# Patient Record
Sex: Female | Born: 1975
Health system: Southern US, Community
[De-identification: ages and names within clinical notes are randomized; demographics above are authoritative.]

## PROBLEM LIST (undated history)

## (undated) DIAGNOSIS — D649 Anemia, unspecified: Secondary | ICD-10-CM

## (undated) DIAGNOSIS — F419 Anxiety disorder, unspecified: Secondary | ICD-10-CM

## (undated) DIAGNOSIS — G4730 Sleep apnea, unspecified: Secondary | ICD-10-CM

## (undated) DIAGNOSIS — T7840XA Allergy, unspecified, initial encounter: Secondary | ICD-10-CM

## (undated) DIAGNOSIS — I10 Essential (primary) hypertension: Secondary | ICD-10-CM

## (undated) DIAGNOSIS — K219 Gastro-esophageal reflux disease without esophagitis: Secondary | ICD-10-CM

## (undated) DIAGNOSIS — F32A Depression, unspecified: Secondary | ICD-10-CM

## (undated) HISTORY — DX: Anxiety disorder, unspecified: F41.9

## (undated) HISTORY — PX: SMALL INTESTINE SURGERY: SHX150

## (undated) HISTORY — DX: Anemia, unspecified: D64.9

## (undated) HISTORY — PX: GASTRIC BYPASS: SHX52

## (undated) HISTORY — PX: KNEE SURGERY: SHX244

## (undated) HISTORY — PX: CHOLECYSTECTOMY: SHX55

## (undated) HISTORY — PX: GASTRECTOMY: SHX58

## (undated) HISTORY — DX: Sleep apnea, unspecified: G47.30

## (undated) HISTORY — PX: WRIST FRACTURE SURGERY: SHX121

## (undated) HISTORY — DX: Allergy, unspecified, initial encounter: T78.40XA

## (undated) HISTORY — DX: Depression, unspecified: F32.A

## (undated) HISTORY — PX: LAPAROSCOPIC GASTRIC RESTRICTIVE DUODENAL PROCEDURE (DUODENAL SWITCH): SHX6667

---

## 1994-04-05 DIAGNOSIS — F32A Depression, unspecified: Secondary | ICD-10-CM | POA: Insufficient documentation

## 1994-04-05 DIAGNOSIS — F4312 Post-traumatic stress disorder, chronic: Secondary | ICD-10-CM | POA: Insufficient documentation

## 1999-03-18 ENCOUNTER — Other Ambulatory Visit: Admission: RE | Admit: 1999-03-18 | Discharge: 1999-03-18 | Payer: Self-pay | Admitting: Family Medicine

## 1999-04-06 DIAGNOSIS — M545 Low back pain, unspecified: Secondary | ICD-10-CM | POA: Insufficient documentation

## 2000-05-29 ENCOUNTER — Other Ambulatory Visit: Admission: RE | Admit: 2000-05-29 | Discharge: 2000-05-29 | Payer: Self-pay | Admitting: Obstetrics and Gynecology

## 2000-07-31 ENCOUNTER — Ambulatory Visit (HOSPITAL_COMMUNITY): Admission: RE | Admit: 2000-07-31 | Discharge: 2000-07-31 | Payer: Self-pay | Admitting: Obstetrics and Gynecology

## 2000-07-31 ENCOUNTER — Encounter (INDEPENDENT_AMBULATORY_CARE_PROVIDER_SITE_OTHER): Payer: Self-pay | Admitting: Specialist

## 2001-05-14 ENCOUNTER — Encounter: Admission: RE | Admit: 2001-05-14 | Discharge: 2001-07-21 | Payer: Self-pay | Admitting: Orthopedic Surgery

## 2001-07-21 ENCOUNTER — Other Ambulatory Visit: Admission: RE | Admit: 2001-07-21 | Discharge: 2001-07-21 | Payer: Self-pay | Admitting: *Deleted

## 2005-08-03 ENCOUNTER — Emergency Department (HOSPITAL_COMMUNITY): Admission: EM | Admit: 2005-08-03 | Discharge: 2005-08-03 | Payer: Self-pay | Admitting: Emergency Medicine

## 2006-09-17 ENCOUNTER — Ambulatory Visit: Payer: Self-pay | Admitting: Internal Medicine

## 2006-09-22 ENCOUNTER — Ambulatory Visit: Payer: Self-pay

## 2006-10-01 ENCOUNTER — Ambulatory Visit: Payer: Self-pay | Admitting: Internal Medicine

## 2006-10-08 ENCOUNTER — Ambulatory Visit: Payer: Self-pay | Admitting: Internal Medicine

## 2007-04-11 ENCOUNTER — Emergency Department (HOSPITAL_COMMUNITY): Admission: EM | Admit: 2007-04-11 | Discharge: 2007-04-11 | Payer: Self-pay | Admitting: Emergency Medicine

## 2007-04-11 IMAGING — CR DG ANKLE COMPLETE 3+V*R*
3 series · 3 of 3 positions shown · non-contrast
Comparison: None.

Exam: Right ankle complete. Three views.

HISTORY: Fall.

[t ankle joint ap right]
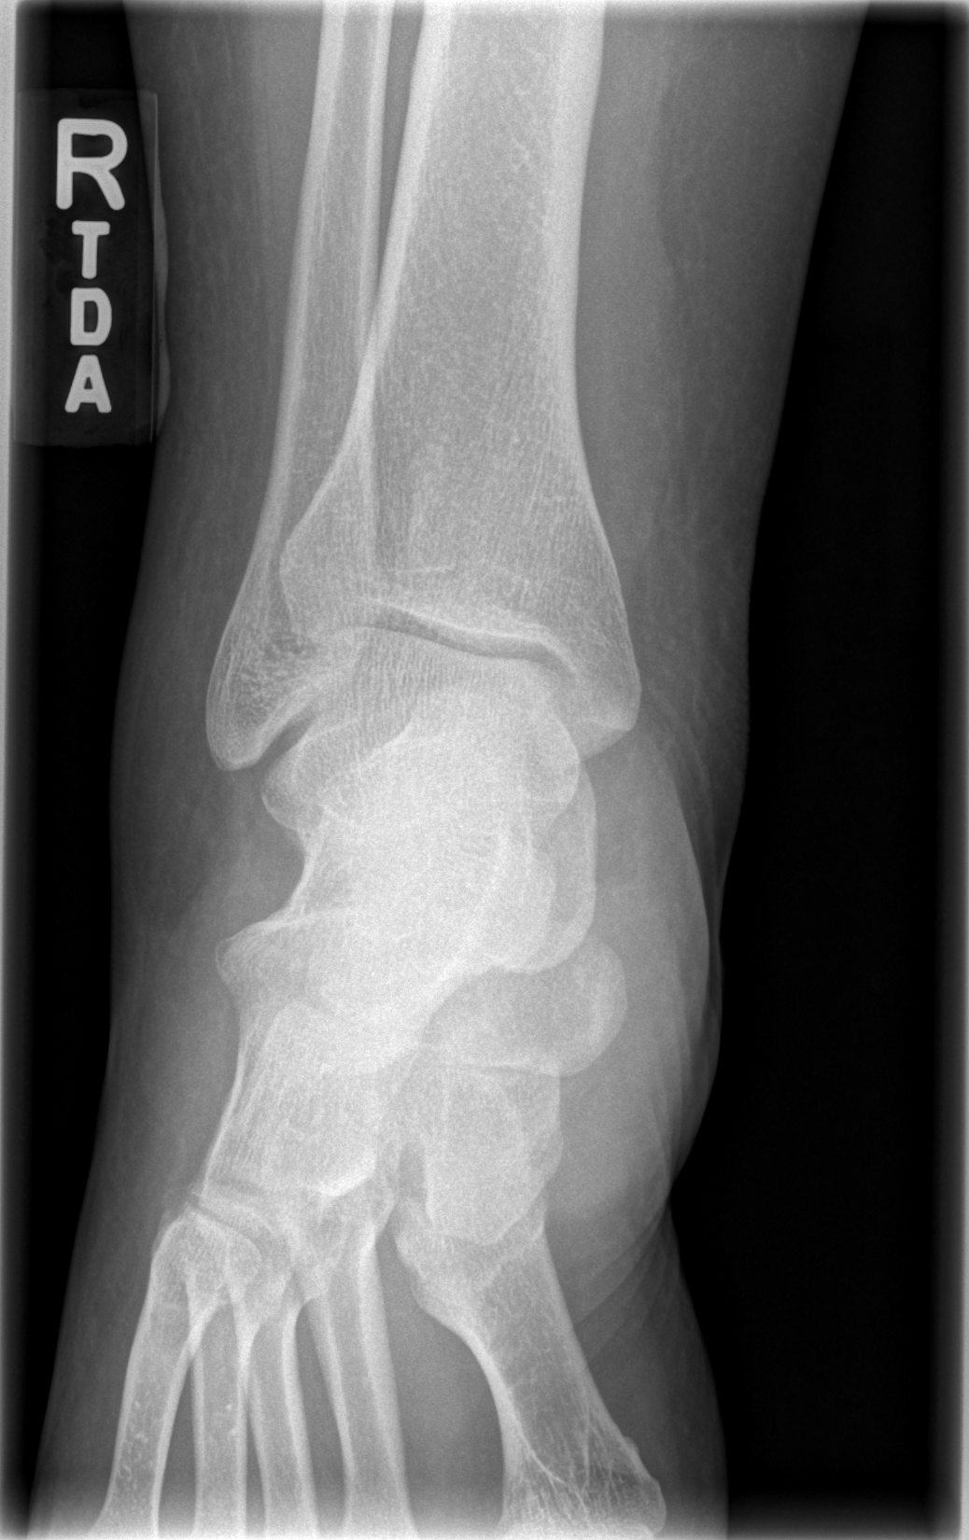

[t ankle joint oblique right]
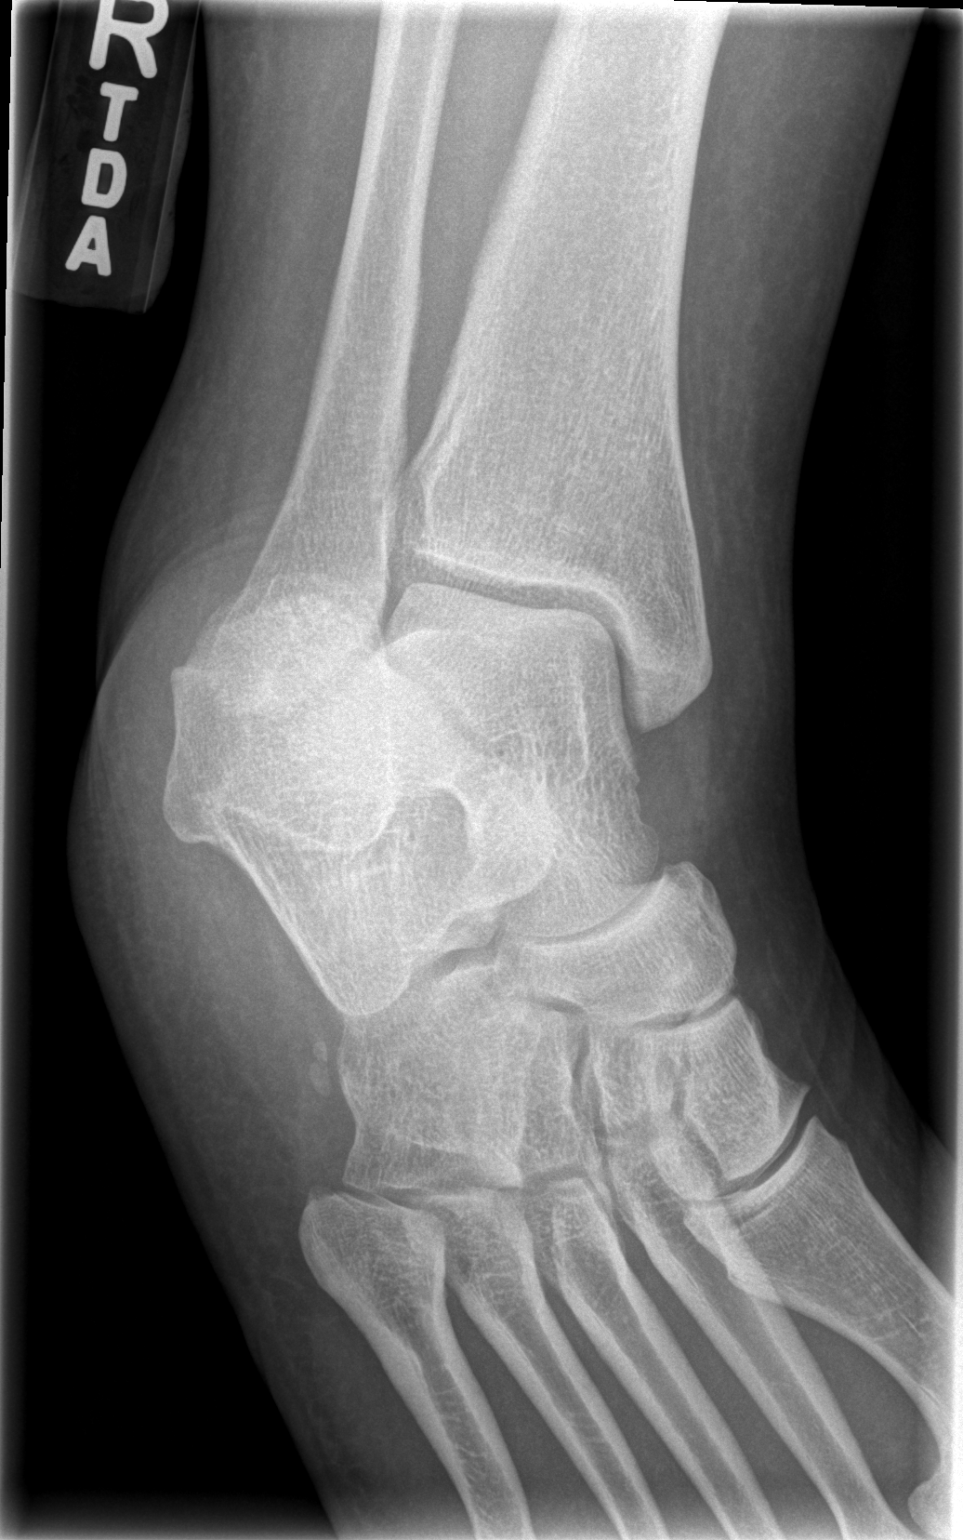

[t ankle joint lat right]
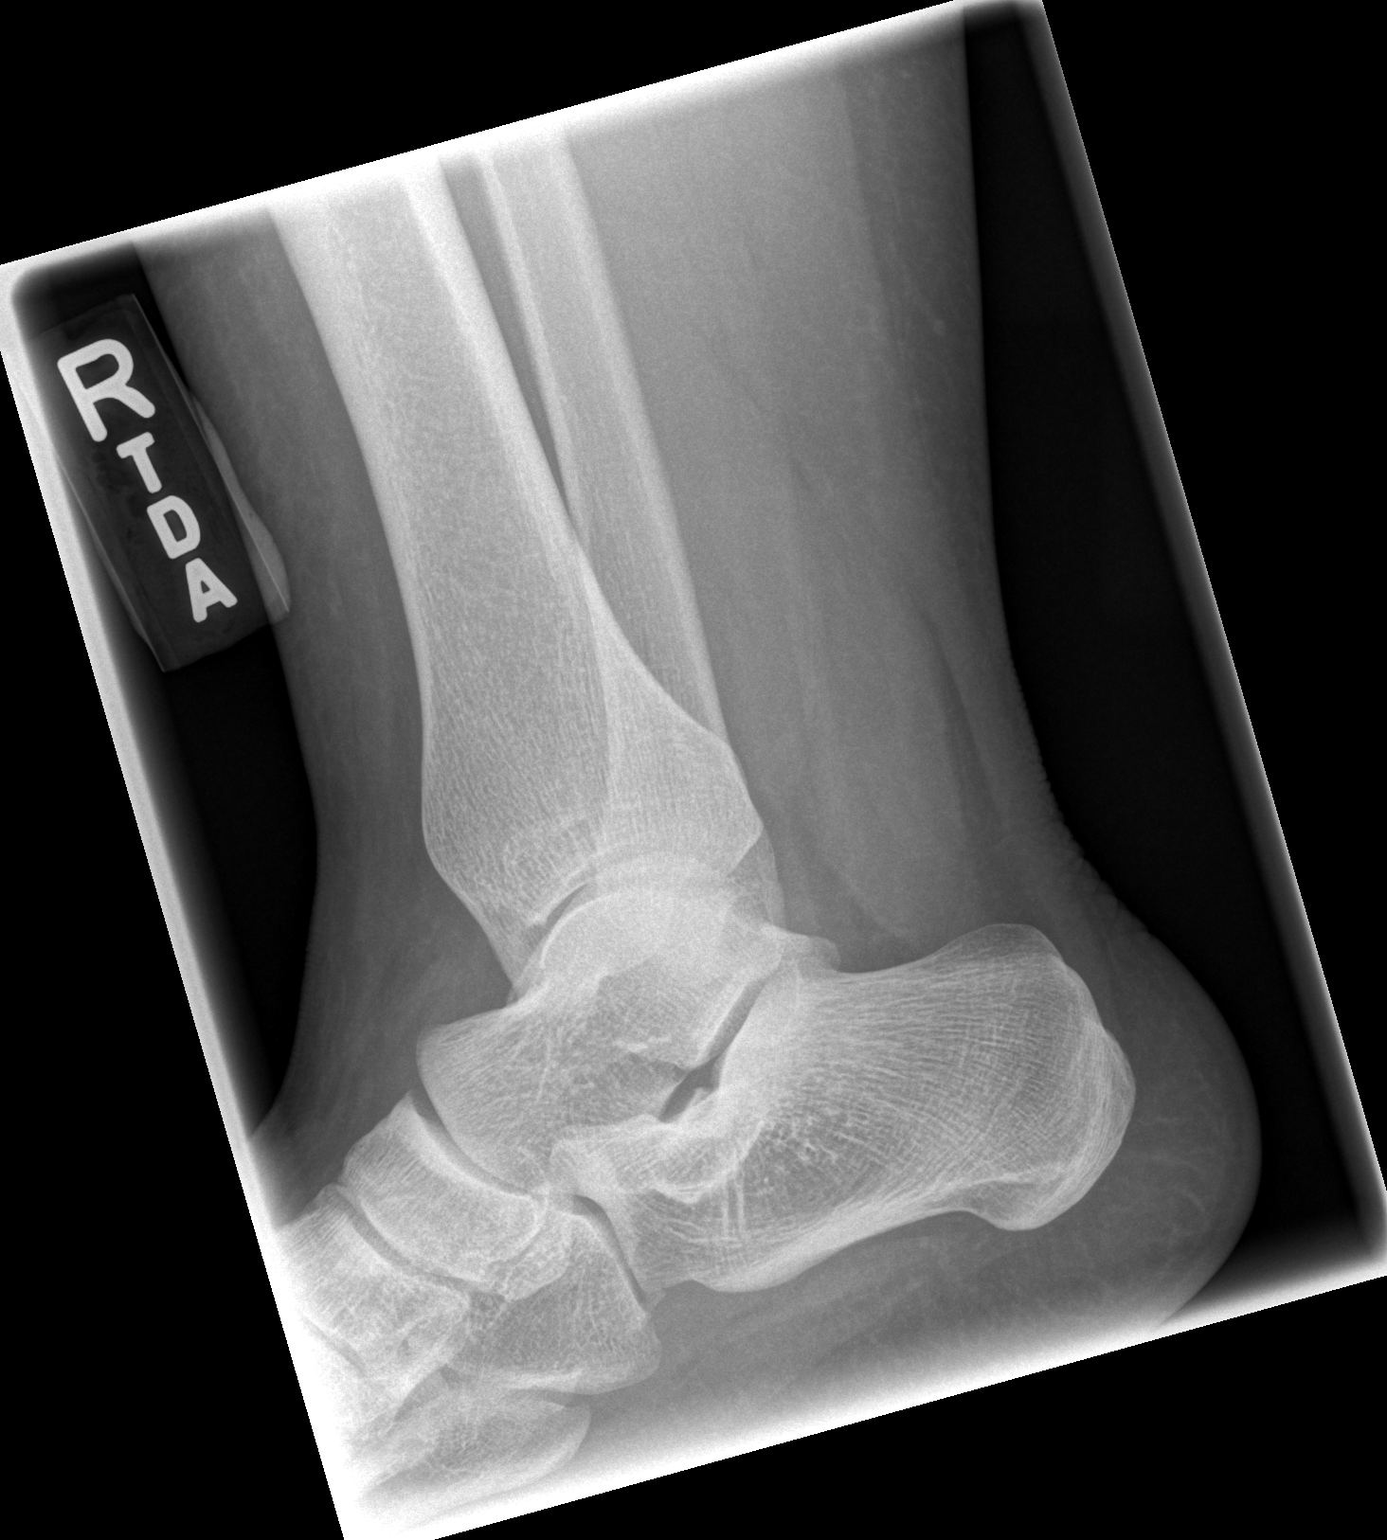

[3 of 3 positions shown; findings below may reference images not displayed]

FINDINGS: There is diffuse soft tissue swelling about the ankle.

No acute osseous abnormality noted. 

Specifically no fracture or dislocation.
IMPRESSION: No acute osseous abnormality

## 2008-06-22 ENCOUNTER — Ambulatory Visit: Payer: Self-pay | Admitting: Internal Medicine

## 2008-12-22 DIAGNOSIS — G8929 Other chronic pain: Secondary | ICD-10-CM | POA: Insufficient documentation

## 2008-12-22 DIAGNOSIS — K760 Fatty (change of) liver, not elsewhere classified: Secondary | ICD-10-CM | POA: Insufficient documentation

## 2009-08-10 ENCOUNTER — Telehealth: Payer: Self-pay | Admitting: Internal Medicine

## 2009-09-26 ENCOUNTER — Encounter: Payer: Self-pay | Admitting: Internal Medicine

## 2009-09-26 ENCOUNTER — Telehealth: Payer: Self-pay | Admitting: Internal Medicine

## 2009-10-31 ENCOUNTER — Ambulatory Visit: Payer: Self-pay | Admitting: Family Medicine

## 2009-10-31 DIAGNOSIS — G4730 Sleep apnea, unspecified: Secondary | ICD-10-CM | POA: Insufficient documentation

## 2009-10-31 DIAGNOSIS — F329 Major depressive disorder, single episode, unspecified: Secondary | ICD-10-CM | POA: Insufficient documentation

## 2009-10-31 DIAGNOSIS — F3289 Other specified depressive episodes: Secondary | ICD-10-CM | POA: Insufficient documentation

## 2009-10-31 DIAGNOSIS — K219 Gastro-esophageal reflux disease without esophagitis: Secondary | ICD-10-CM | POA: Insufficient documentation

## 2009-10-31 DIAGNOSIS — R002 Palpitations: Secondary | ICD-10-CM | POA: Insufficient documentation

## 2009-10-31 DIAGNOSIS — I440 Atrioventricular block, first degree: Secondary | ICD-10-CM | POA: Insufficient documentation

## 2009-10-31 DIAGNOSIS — E669 Obesity, unspecified: Secondary | ICD-10-CM

## 2009-10-31 DIAGNOSIS — I455 Other specified heart block: Secondary | ICD-10-CM | POA: Insufficient documentation

## 2009-10-31 DIAGNOSIS — I10 Essential (primary) hypertension: Secondary | ICD-10-CM | POA: Insufficient documentation

## 2009-10-31 DIAGNOSIS — J069 Acute upper respiratory infection, unspecified: Secondary | ICD-10-CM | POA: Insufficient documentation

## 2009-11-01 ENCOUNTER — Encounter (INDEPENDENT_AMBULATORY_CARE_PROVIDER_SITE_OTHER): Payer: Self-pay | Admitting: Family Medicine

## 2009-12-03 ENCOUNTER — Ambulatory Visit: Payer: Self-pay | Admitting: Family Medicine

## 2009-12-03 DIAGNOSIS — E559 Vitamin D deficiency, unspecified: Secondary | ICD-10-CM | POA: Insufficient documentation

## 2009-12-03 DIAGNOSIS — J309 Allergic rhinitis, unspecified: Secondary | ICD-10-CM | POA: Insufficient documentation

## 2009-12-03 DIAGNOSIS — H9209 Otalgia, unspecified ear: Secondary | ICD-10-CM | POA: Insufficient documentation

## 2010-01-28 ENCOUNTER — Emergency Department (HOSPITAL_COMMUNITY): Admission: EM | Admit: 2010-01-28 | Discharge: 2010-01-28 | Payer: Self-pay | Admitting: Emergency Medicine

## 2010-01-31 ENCOUNTER — Telehealth: Payer: Self-pay | Admitting: Physician Assistant

## 2010-02-07 ENCOUNTER — Telehealth: Payer: Self-pay | Admitting: Internal Medicine

## 2010-04-30 ENCOUNTER — Ambulatory Visit: Payer: Self-pay | Admitting: Physician Assistant

## 2010-06-28 ENCOUNTER — Encounter (INDEPENDENT_AMBULATORY_CARE_PROVIDER_SITE_OTHER): Payer: Self-pay | Admitting: Family Medicine

## 2010-09-05 ENCOUNTER — Encounter: Admission: RE | Admit: 2010-09-05 | Discharge: 2010-09-19 | Payer: Self-pay | Admitting: Surgery

## 2010-09-05 ENCOUNTER — Ambulatory Visit (HOSPITAL_COMMUNITY)
Admission: RE | Admit: 2010-09-05 | Discharge: 2010-09-05 | Payer: Self-pay | Source: Home / Self Care | Admitting: Surgery

## 2010-09-05 IMAGING — CR DG CHEST 2V
2 series · 2 of 2 positions shown · non-contrast
Comparison: None.

CLINICAL DATA: Bariatric screening evaluation.  Nonsmoker. No
current chest complaints

CHEST - 2 VIEW

[view not recorded (1 of 2)]
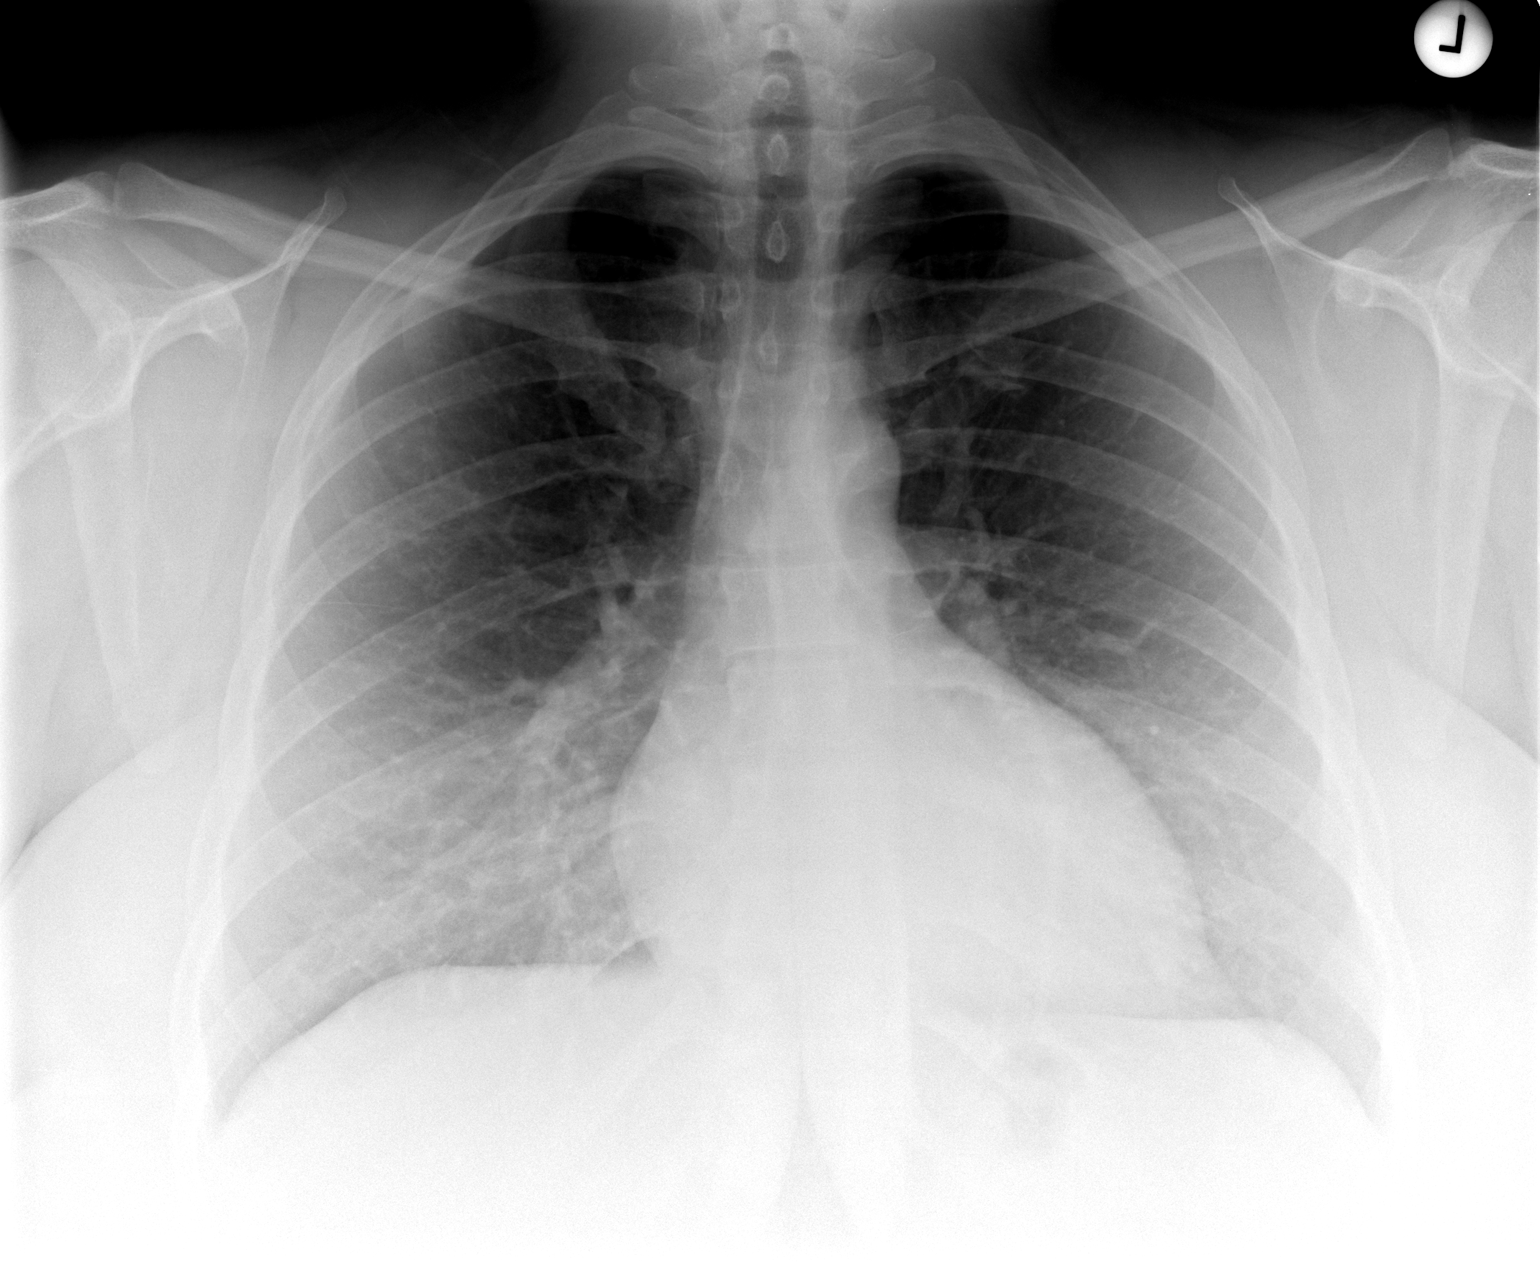

[view not recorded (2 of 2)]
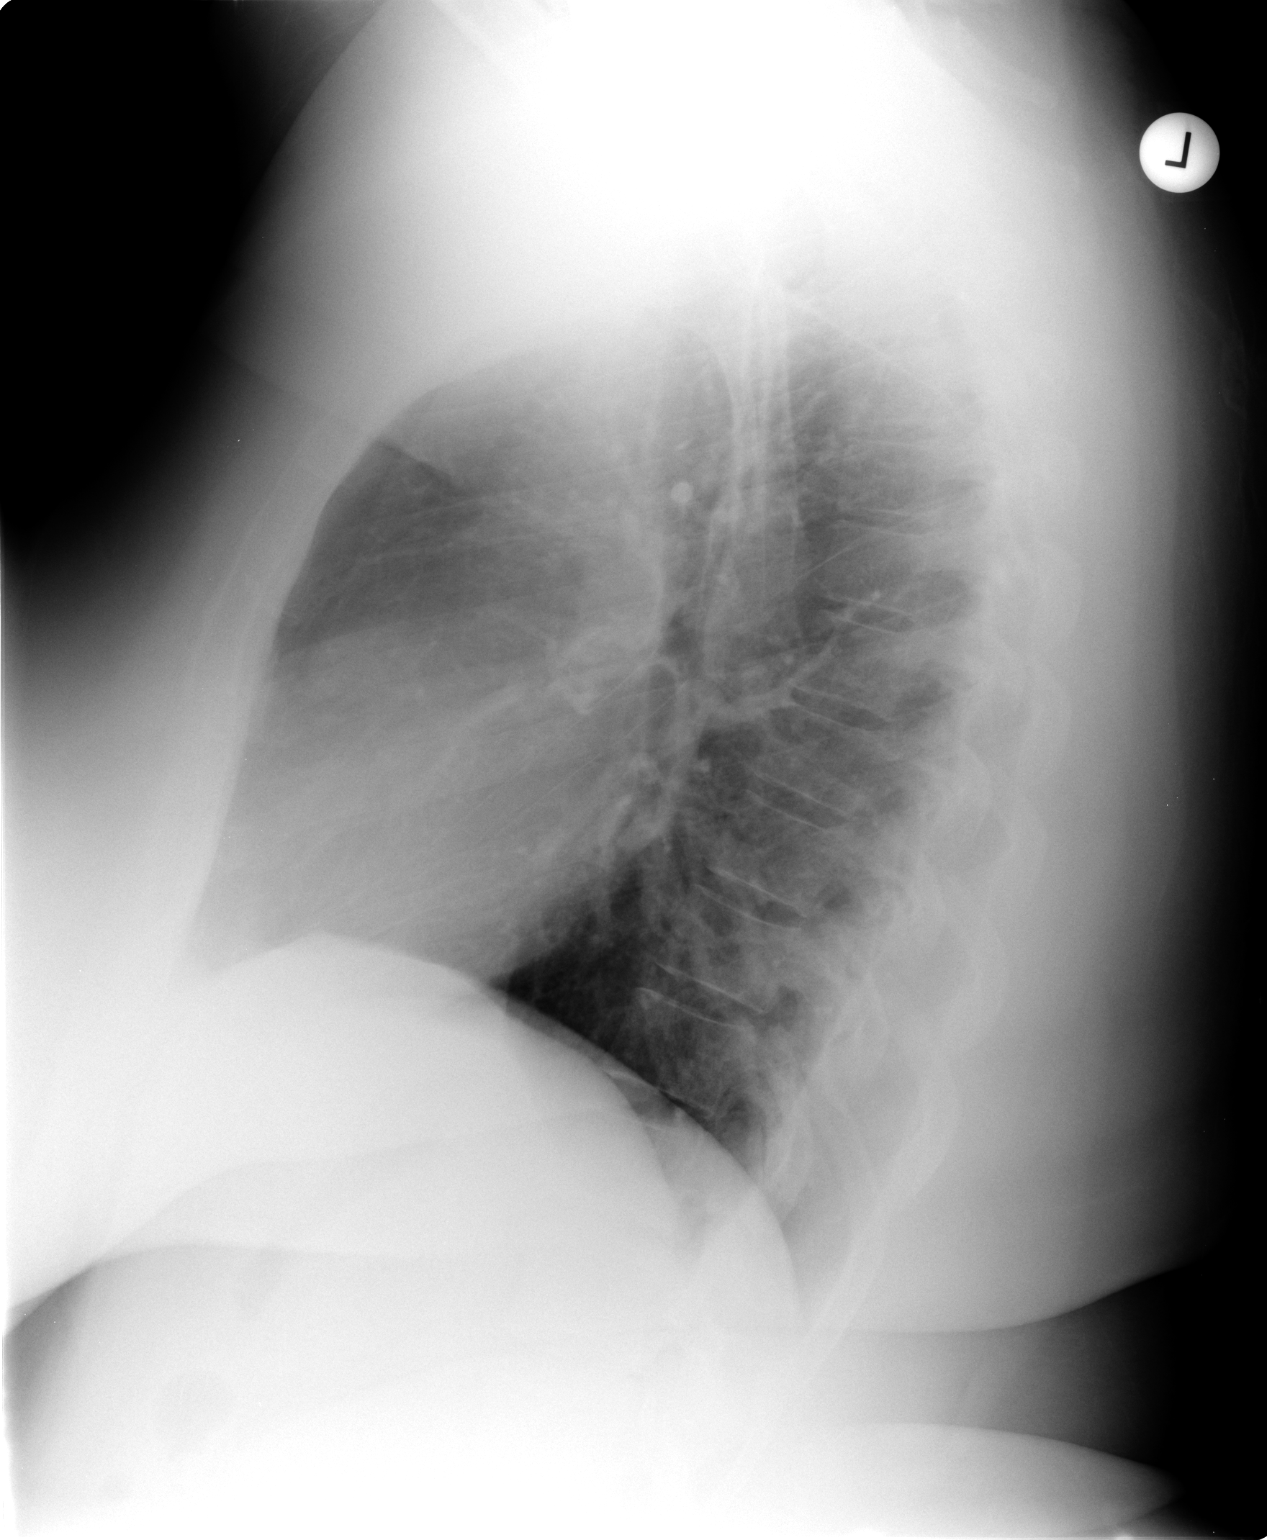

[2 of 2 positions shown; findings below may reference images not displayed]

FINDINGS: Heart and mediastinal contours are within normal limits.
The lung fields are clear with no signs of focal infiltrate or
congestive failure.  No pleural fluid or peribronchial cuffing is
seen.

Bony structures are intact.
IMPRESSION: No focal or acute cardiopulmonary abnormality noted.

## 2010-09-05 IMAGING — US US ABDOMEN COMPLETE
1 series · 13 of 25 positions shown · non-contrast
Comparison: None.

CLINICAL DATA: Bariatric screening evaluation.  Nausea and
constipation.  Elevated creatinine

COMPLETE ABDOMINAL ULTRASOUND

[Series 1: us abdomen complete · 0.23mm/px · 13 of 85 slices shown]
[im 1/85]
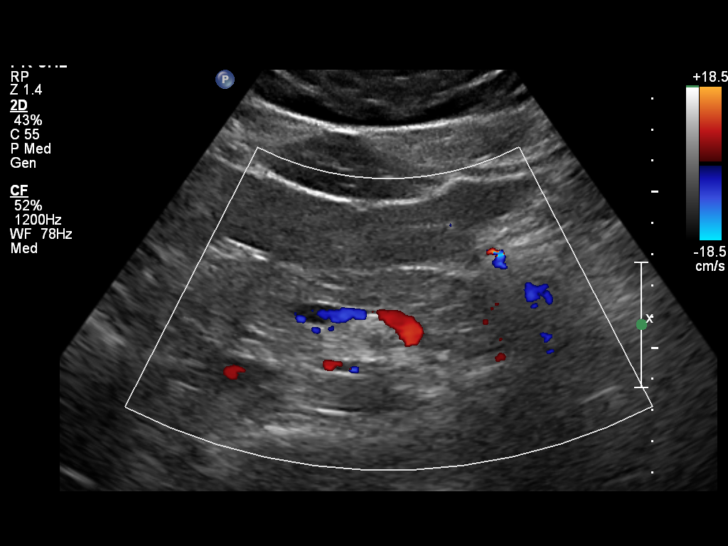
[im 8/85]
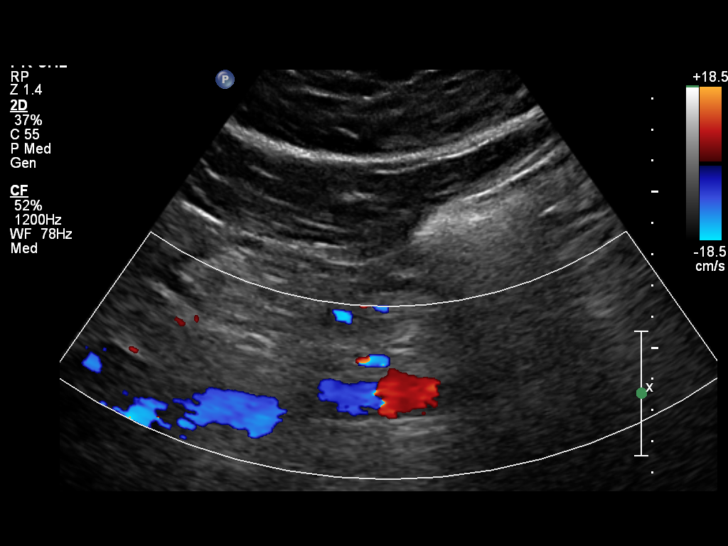
[im 15/85]
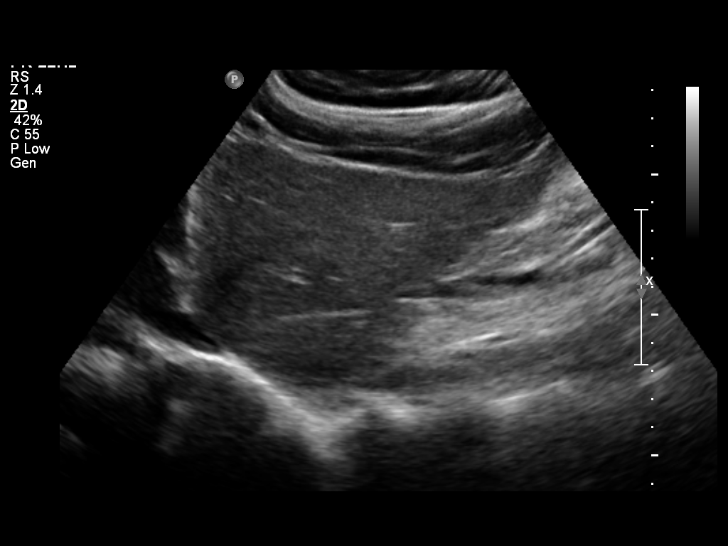
[im 22/85]
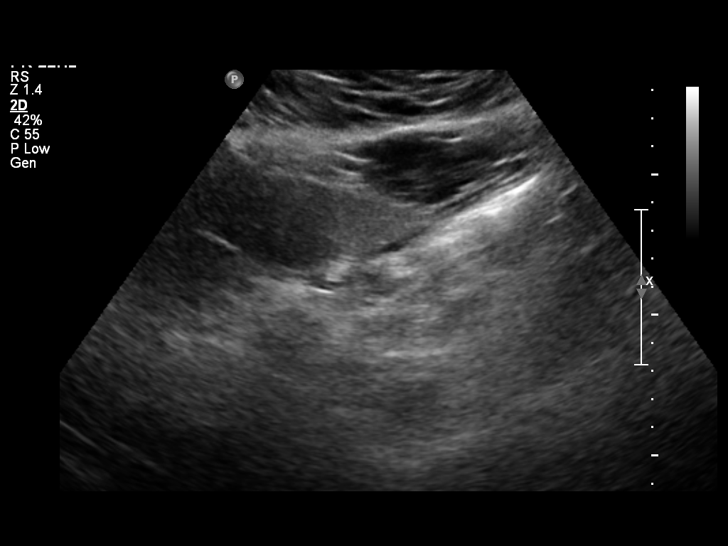
[im 29/85]
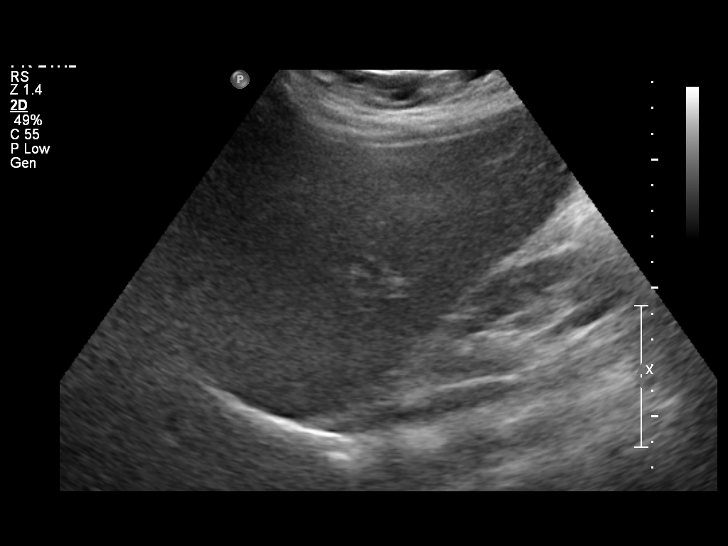
[im 36/85]
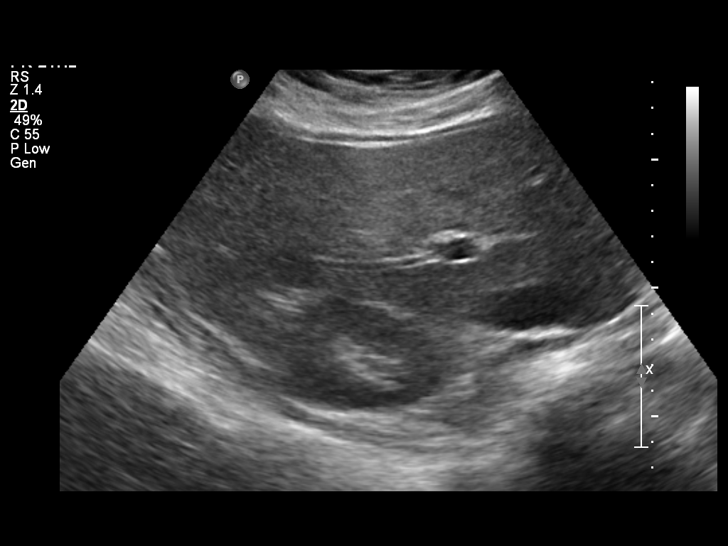
[im 43/85]
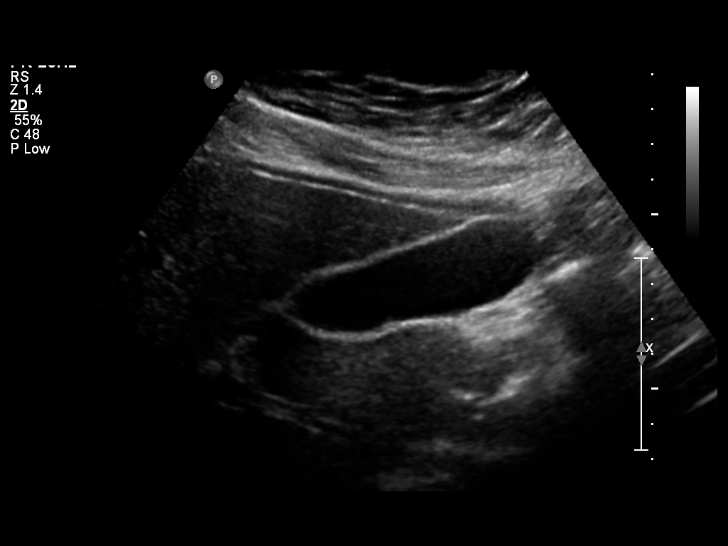
[im 50/85]
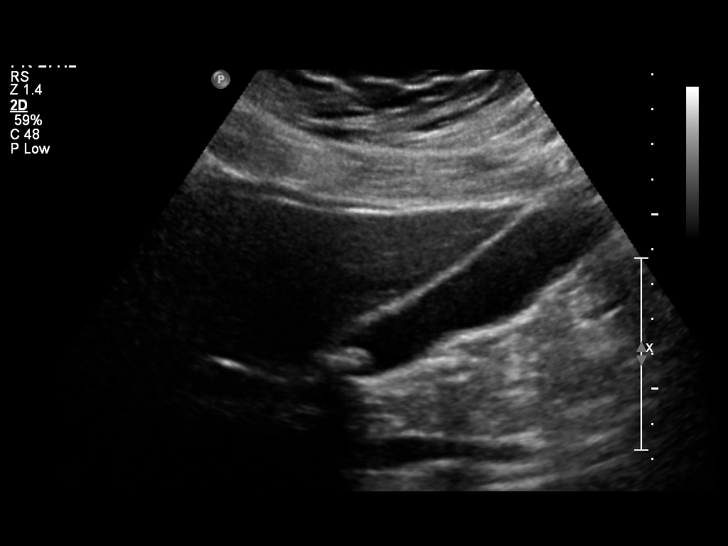
[im 57/85]
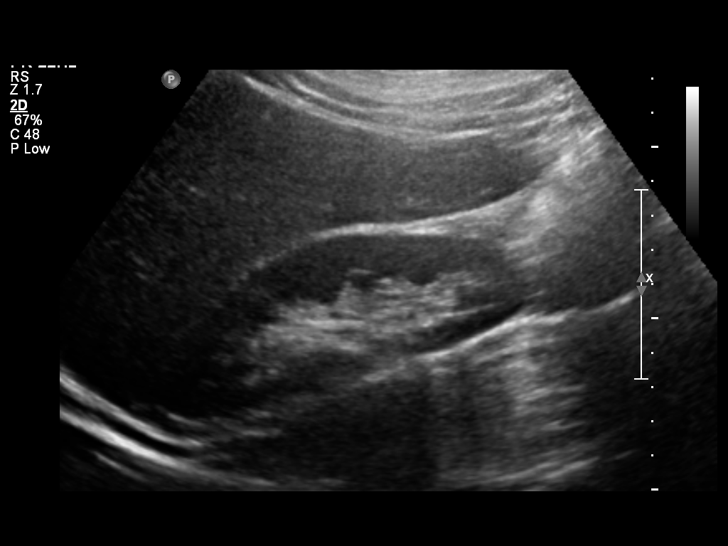
[im 64/85]
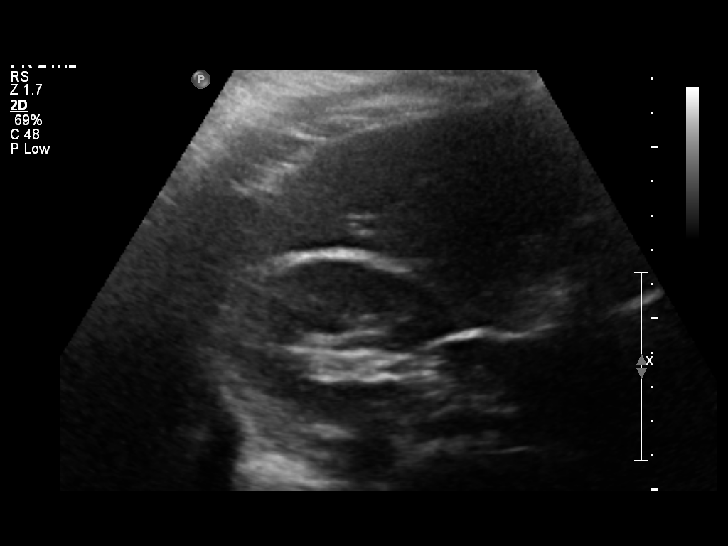
[im 71/85]
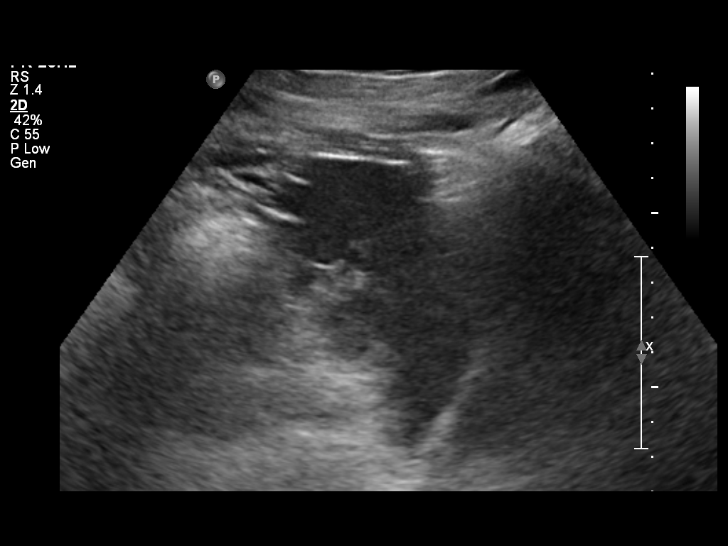
[im 78/85]
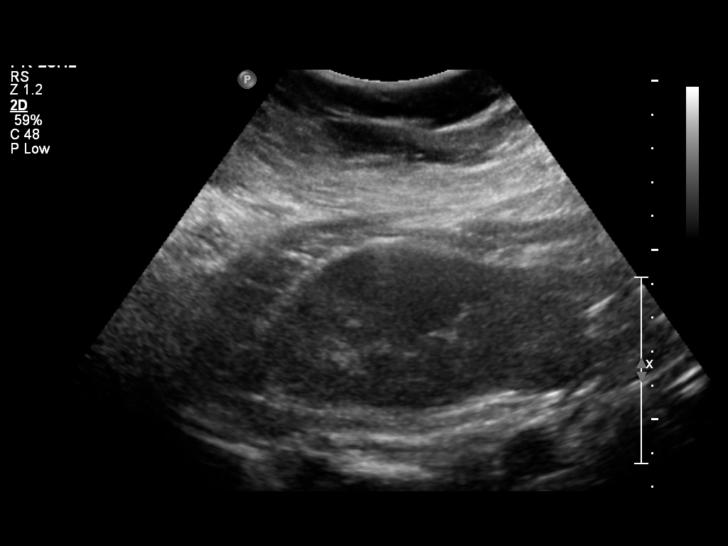
[im 85/85]
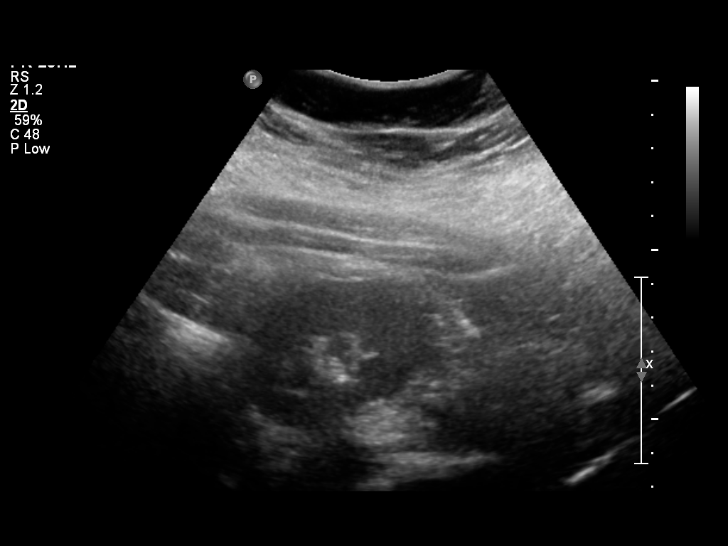

[13 of 25 positions shown; findings below may reference images not displayed]

FINDINGS: Gallbladder:  There is a solitary mobile gallstone identified which
has a length of 1.8 cm.  No associated gallbladder wall thickening
or pericholecystic fluid is seen.  Evaluation for a sonographic
Murphy's sign was negative.

Common bile duct:  Has a normal appearance and a maximal diameter
3.4 mm

Liver:  There is a very mild increase in echotexture diffusely with
preservation of the venous and ductal radicals identified
suspicious for mild hepatic steatosis.  No focal parenchymal
abnormalities are seen.  No signs of intrahepatic ductal dilatation
are noted

IVC:  Appears normal.

Pancreas:  Is normal in size and echotexture

Spleen:  Is normal in size and echotexture with a sagittal length
of 6.9 cm

Right Kidney:  Has a normal appearance measuring 11.2 cm in
sagittal length.  No signs of hydronephrosis are noted

Left Kidney:  Has a normal appearance measuring 10.5 cm in sagittal
length.  No signs of hydronephrosis are seen

Abdominal aorta:  Has a maximal caliber 2.2 cm with no aneurysmal
dilatation noted.
IMPRESSION: Solitary mobile gallstone with no signs of associated
cholecystitis.  Question mild hepatic steatosis.

## 2010-09-12 ENCOUNTER — Ambulatory Visit (HOSPITAL_COMMUNITY)
Admission: RE | Admit: 2010-09-12 | Discharge: 2010-09-12 | Payer: Self-pay | Source: Home / Self Care | Admitting: Surgery

## 2010-11-04 ENCOUNTER — Emergency Department (HOSPITAL_COMMUNITY)
Admission: EM | Admit: 2010-11-04 | Discharge: 2010-11-04 | Payer: Self-pay | Source: Home / Self Care | Admitting: Emergency Medicine

## 2010-11-04 IMAGING — CR DG FOREARM 2V*L*
3 series · 3 of 3 positions shown · non-contrast
Comparison: None.

CLINICAL DATA: Left arm injury [DATE] [DATE] of state.

LEFT FOREARM - 2 VIEW

[x forearm ap left * (1 of 2)]
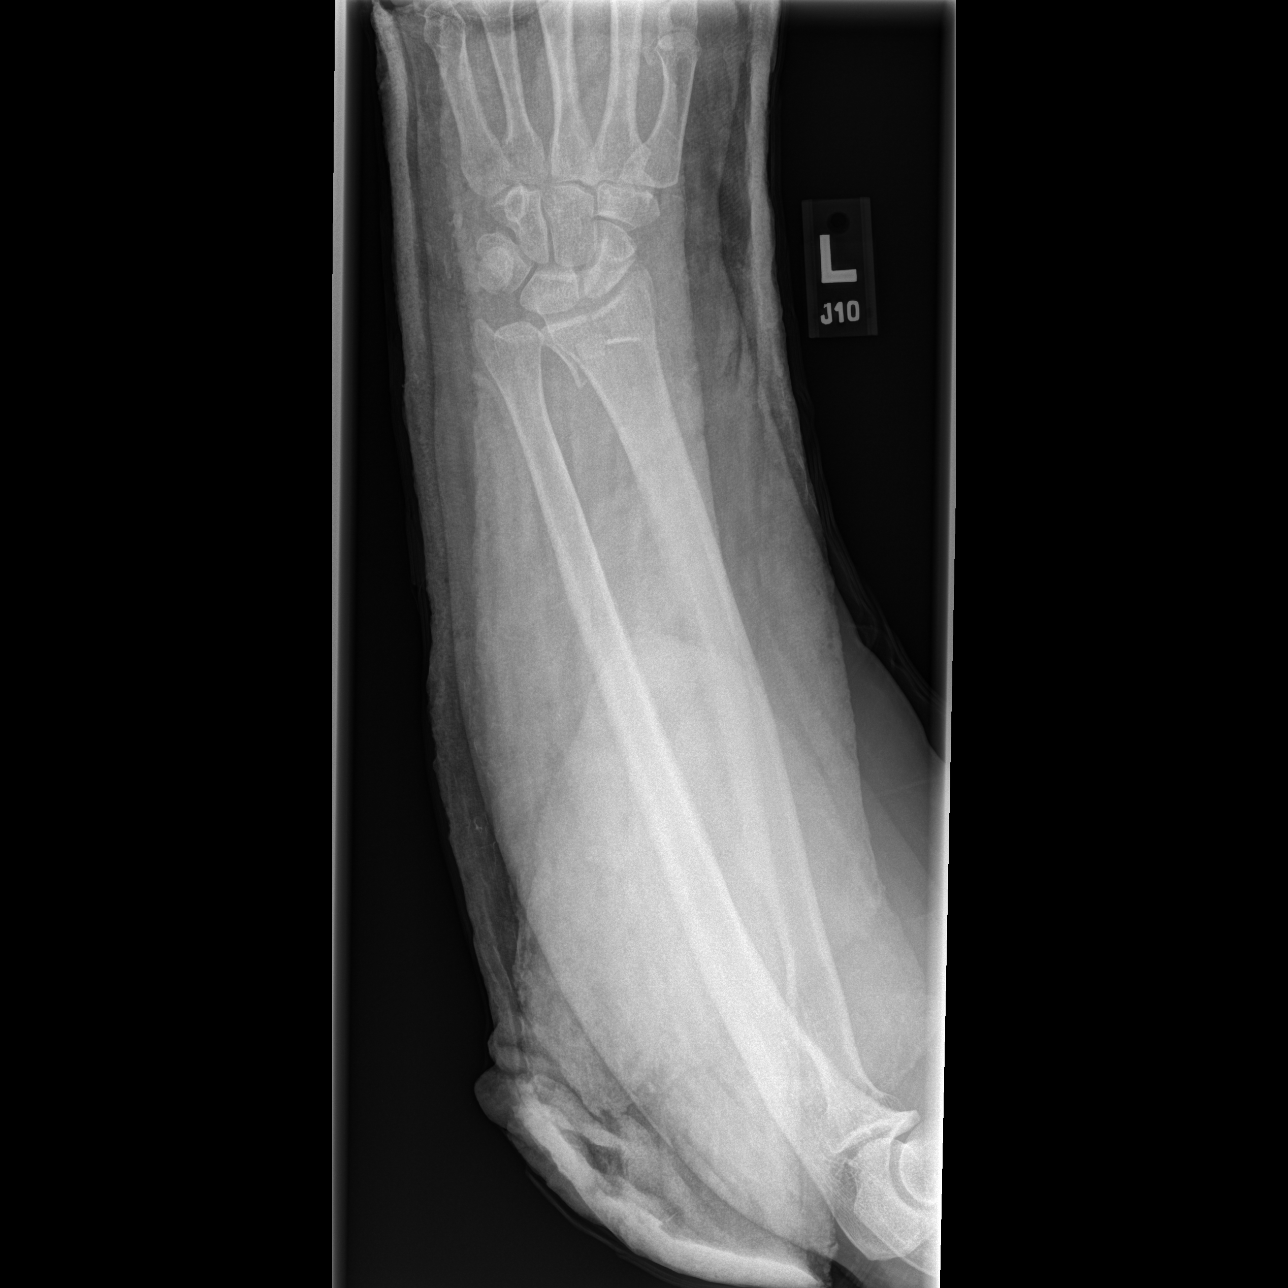

[x forearm ap left * (2 of 2)]
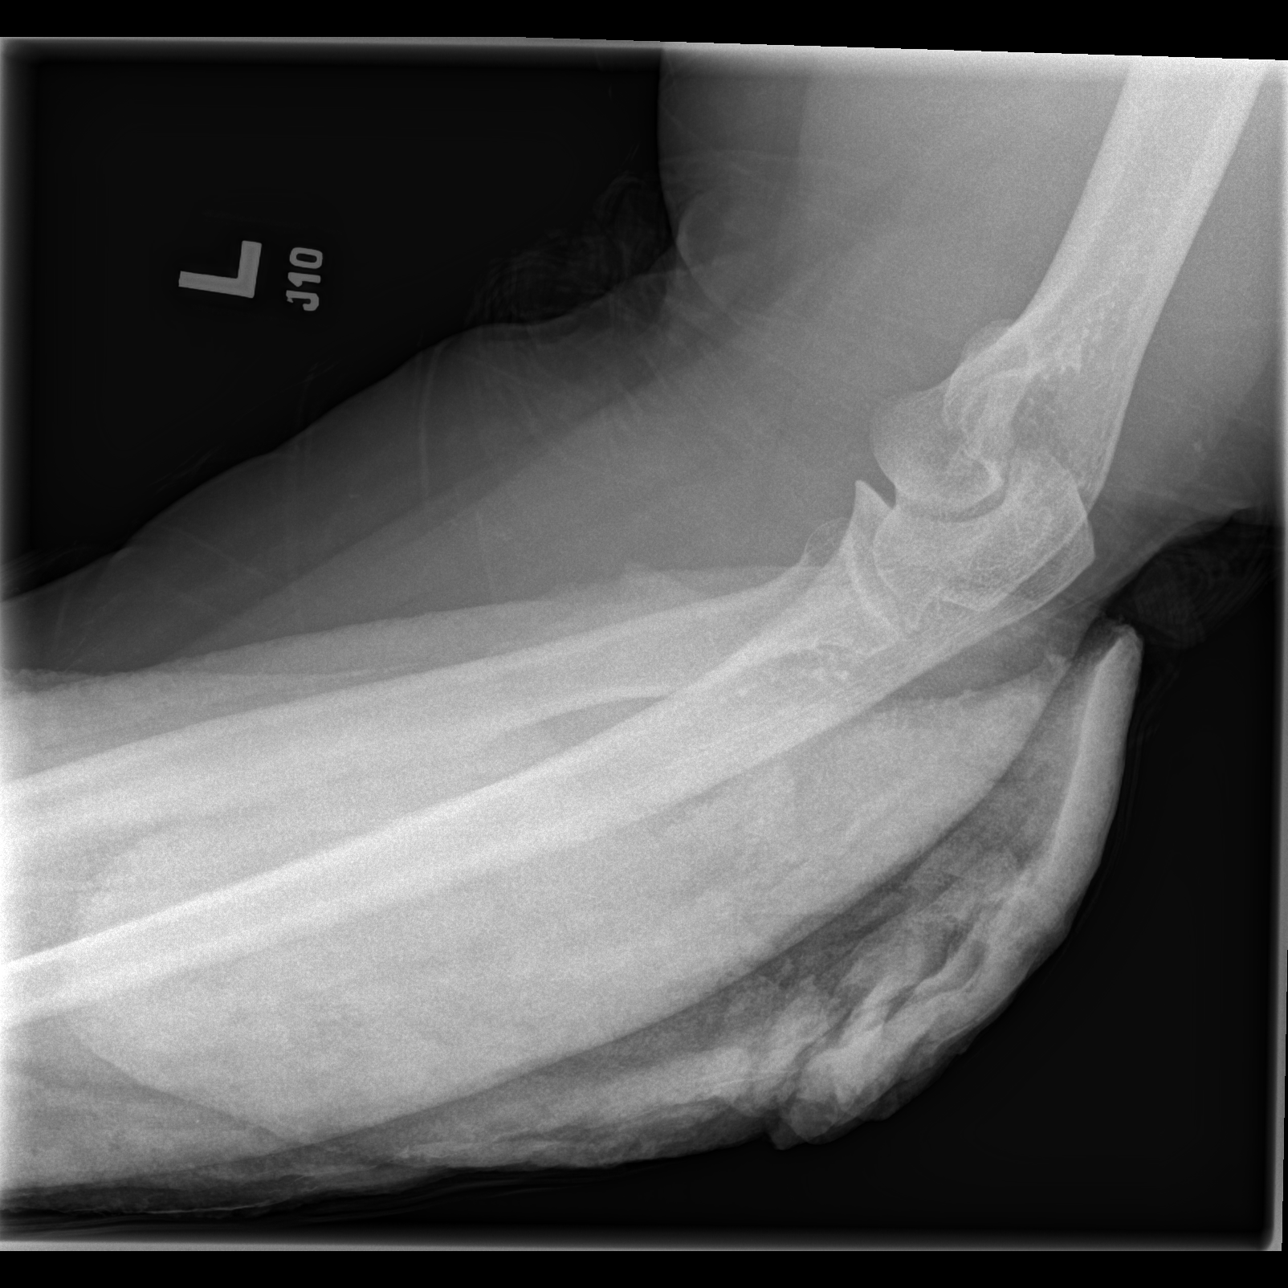

[x forearm lat left]
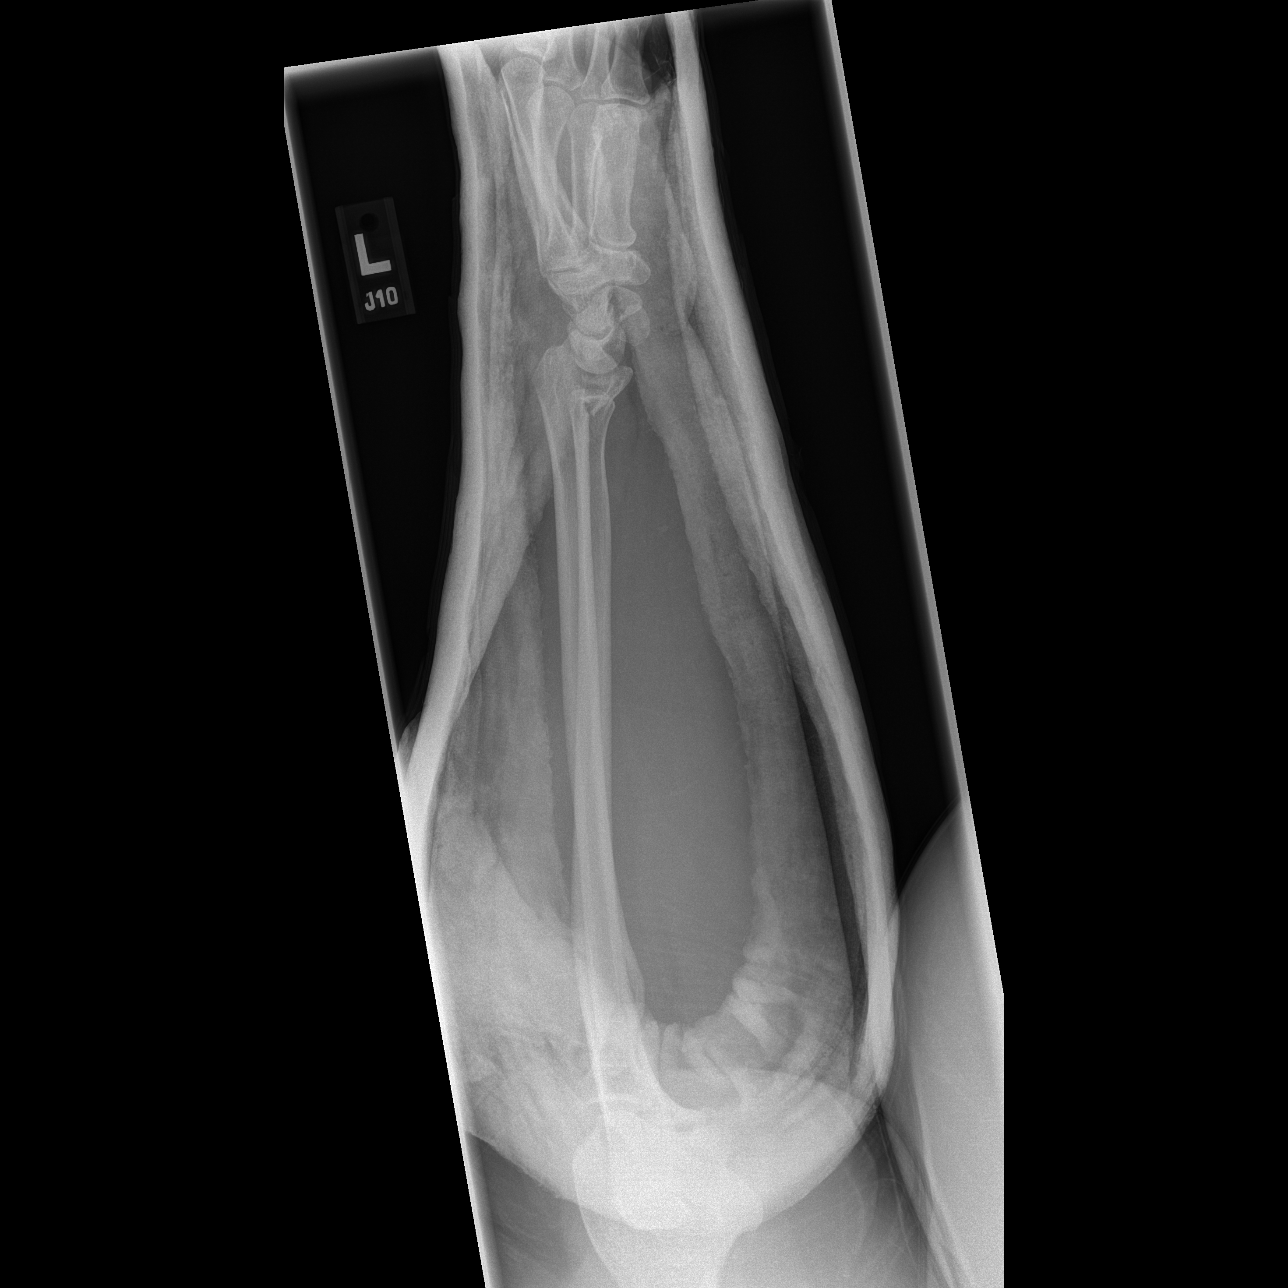

[3 of 3 positions shown; findings below may reference images not displayed]

FINDINGS: There is a fracture of the distal radius which will be
described in more detail in the examination of the wrist to follow.
Otherwise, no fractures noted.  The cast material obscures some of
the bony anatomy.
IMPRESSION: Distal radial fracture.

## 2010-11-05 ENCOUNTER — Telehealth (INDEPENDENT_AMBULATORY_CARE_PROVIDER_SITE_OTHER): Payer: Self-pay | Admitting: *Deleted

## 2010-11-06 ENCOUNTER — Ambulatory Visit (HOSPITAL_COMMUNITY): Admission: RE | Admit: 2010-11-06 | Discharge: 2010-11-07 | Payer: Self-pay | Admitting: Orthopaedic Surgery

## 2010-11-27 ENCOUNTER — Emergency Department (HOSPITAL_COMMUNITY)
Admission: EM | Admit: 2010-11-27 | Discharge: 2010-11-28 | Payer: Self-pay | Source: Home / Self Care | Admitting: Emergency Medicine

## 2010-11-27 IMAGING — CR DG CHEST 2V
2 series · 2 of 2 positions shown · non-contrast
Comparison: Chest x-ray [DATE]

CLINICAL DATA: Cough.

CHEST - 2 VIEW

[w chest pa]
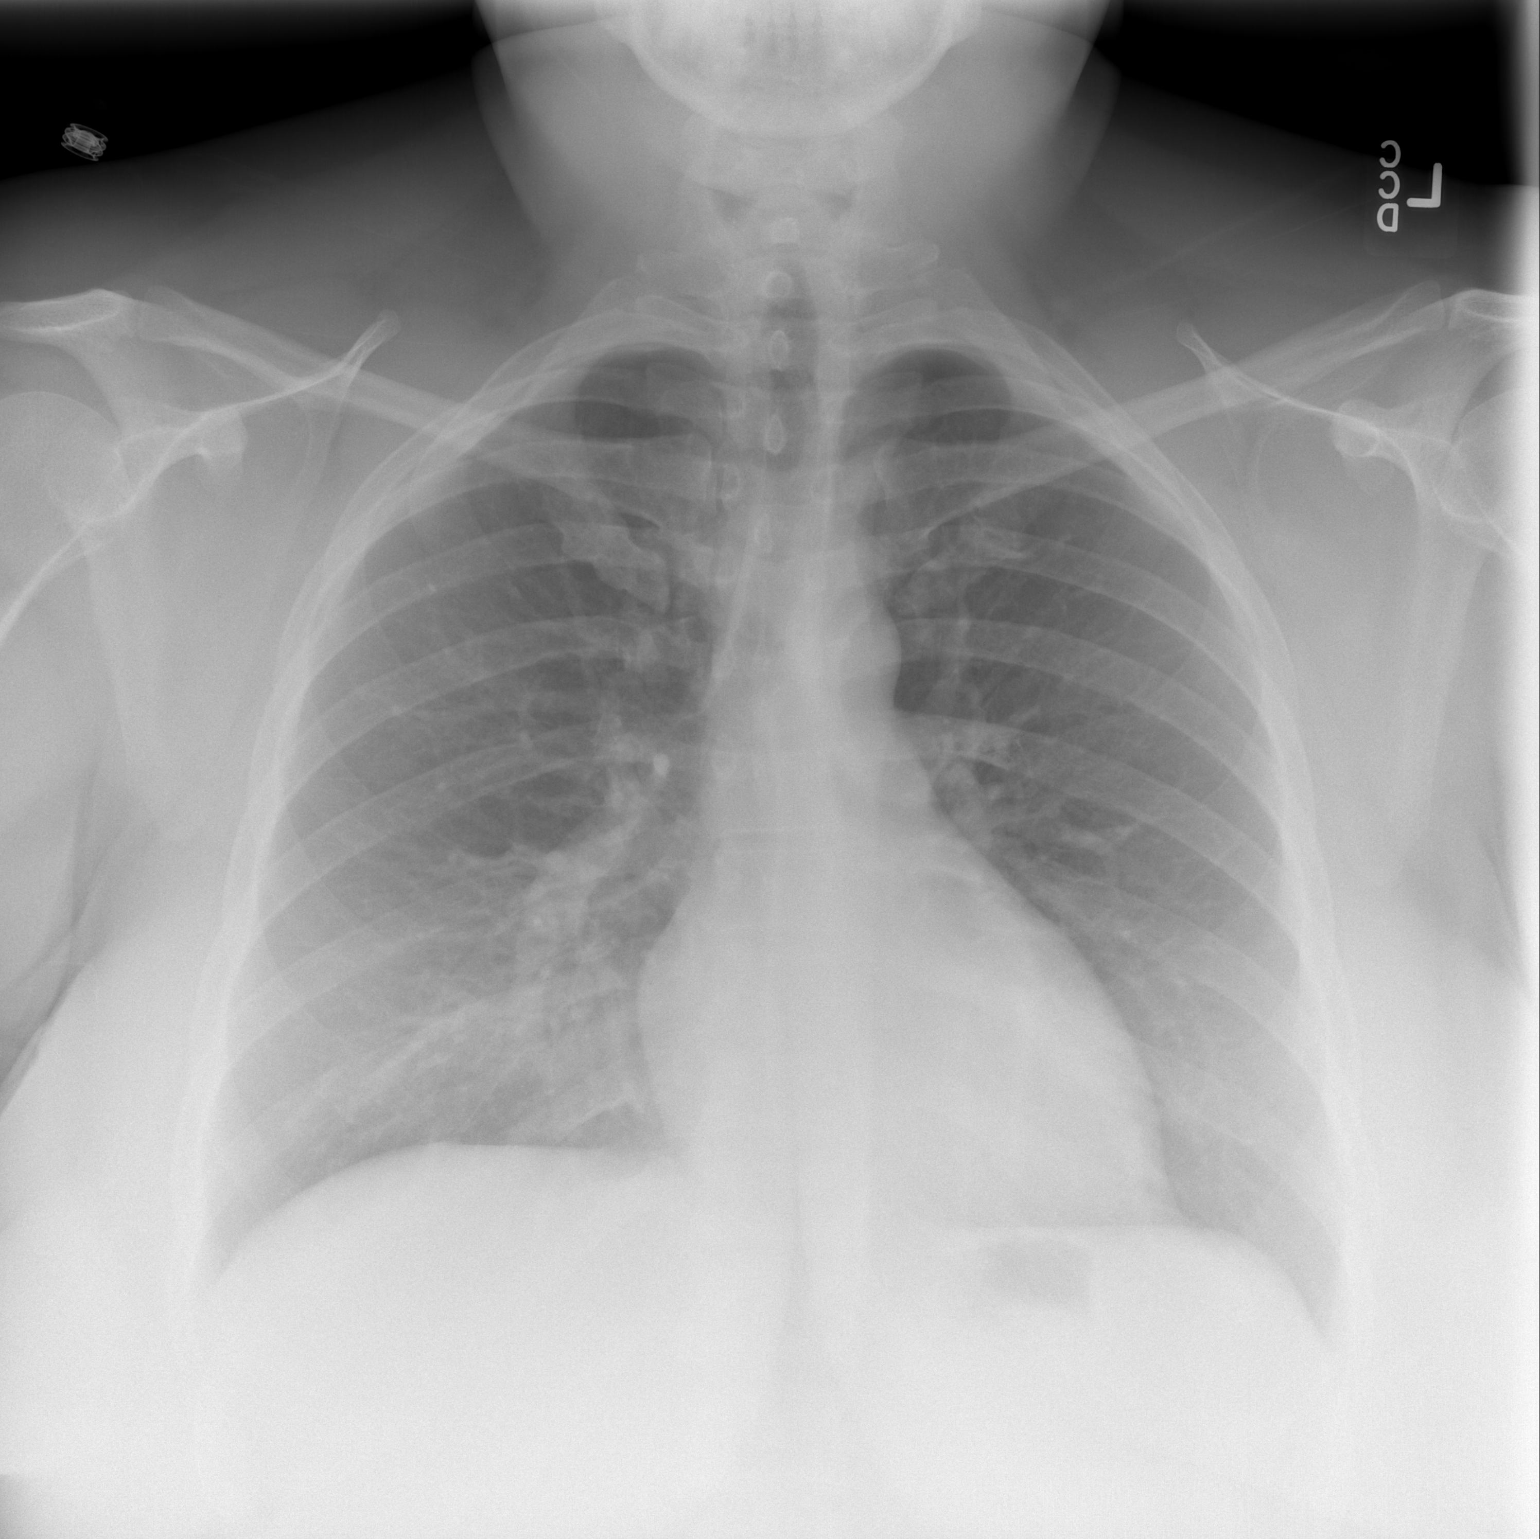

[w chest lat]
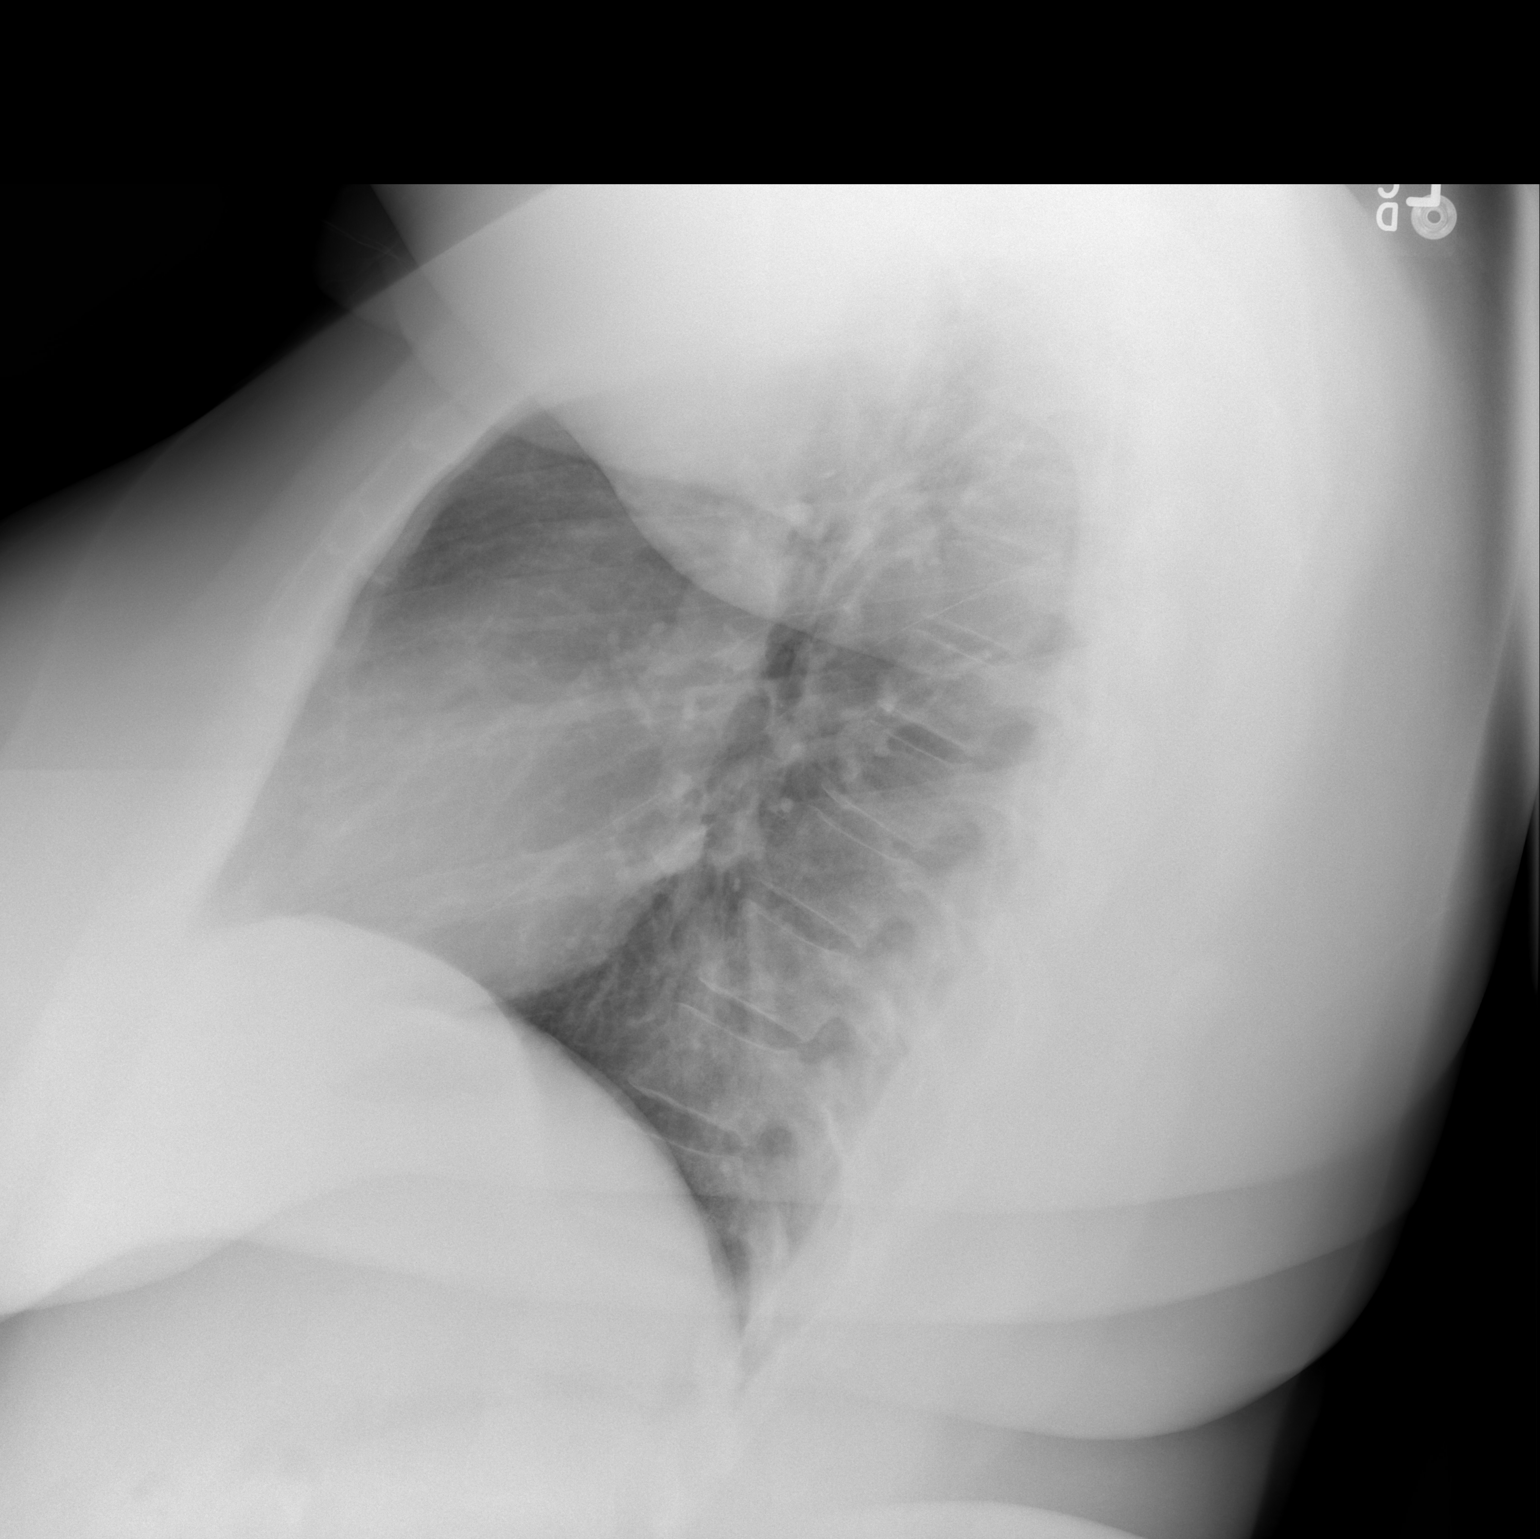

[2 of 2 positions shown; findings below may reference images not displayed]

FINDINGS: The cardiac silhouette, mediastinal and hilar contours
are within normal limits.  The lungs are clear.  The bony thorax is
intact.
IMPRESSION: No acute cardiopulmonary findings.

## 2010-11-28 IMAGING — CT CT ANGIO CHEST
2 of 6 series · 19 of 46 positions shown · IV contrast (APPLIED)
Comparison: None

CLINICAL DATA: Chest pain and cough.

CT ANGIOGRAPHY CHEST WITH CONTRAST
TECHNIQUE: Multidetector CT imaging of the chest was performed
using the standard protocol during bolus administration of
intravenous contrast.  Multiplanar CT image reconstructions
including MIPs were obtained to evaluate the vascular anatomy.
Contrast:  100 ml [8Q].

[Series 7: pe thins @ 1mm · axial · 0.74mm/px · z∈[-109,+143]mm · 16 of 278 slices shown]
[im 13/278  lung]
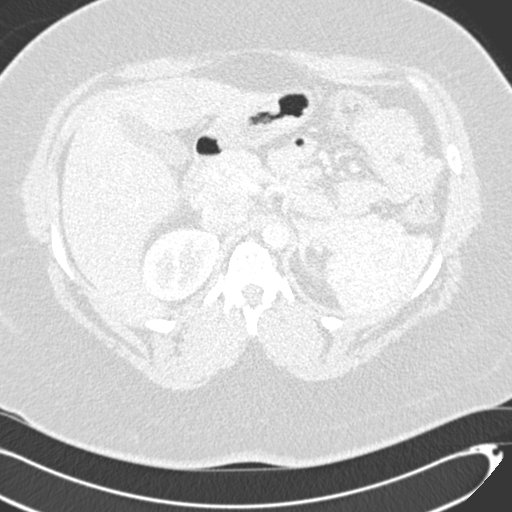
[im 37/278  soft-tissue]
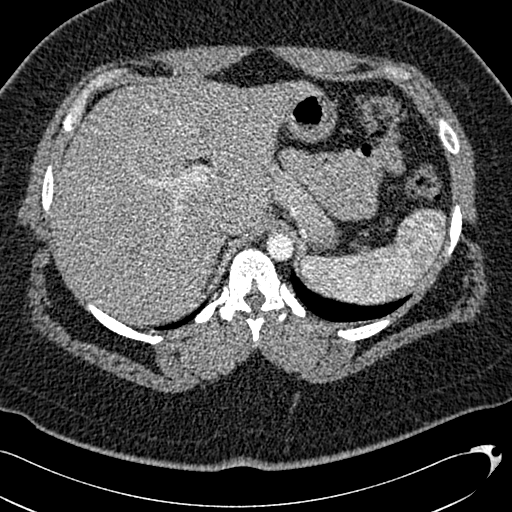
[im 49/278  lung]
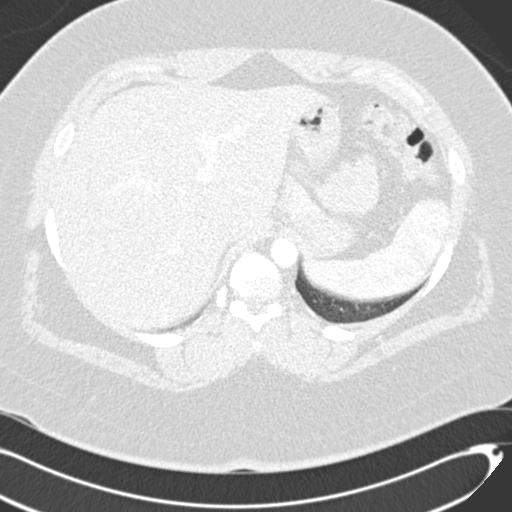
[im 61/278  soft-tissue]
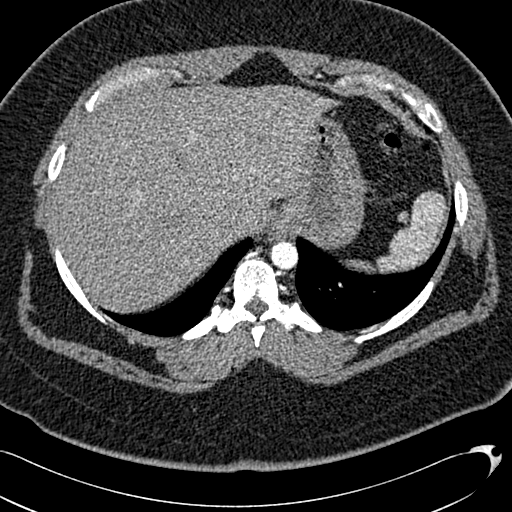
[im 85/278  lung]
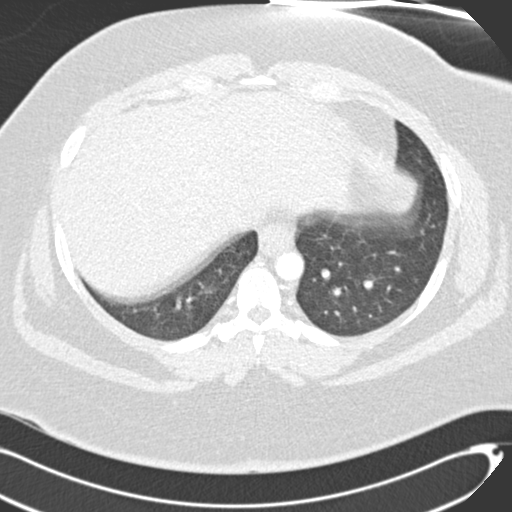
[im 97/278  soft-tissue]
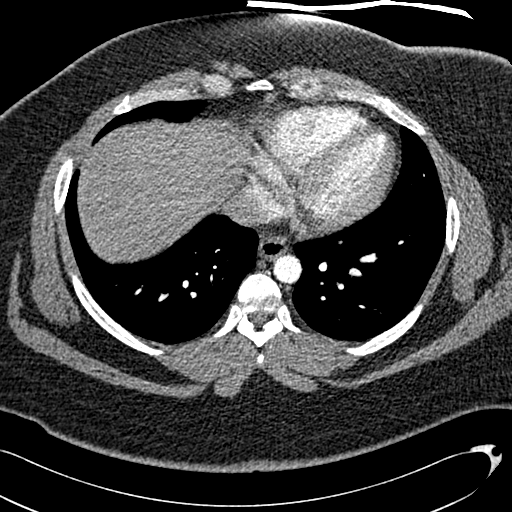
[im 109/278  lung]
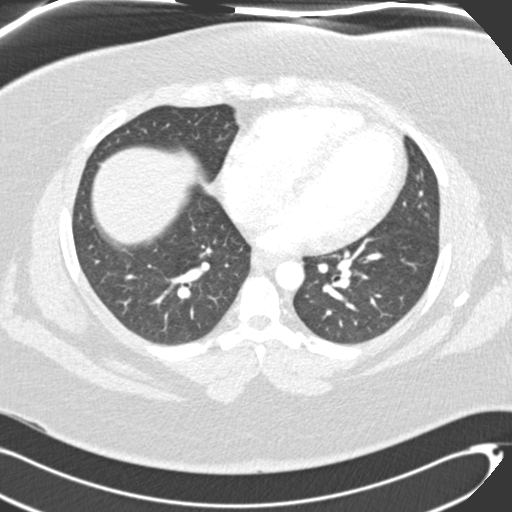
[im 133/278  soft-tissue]
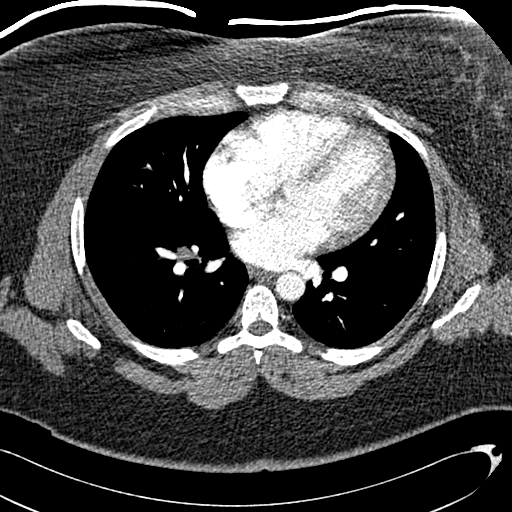
[im 145/278  lung]
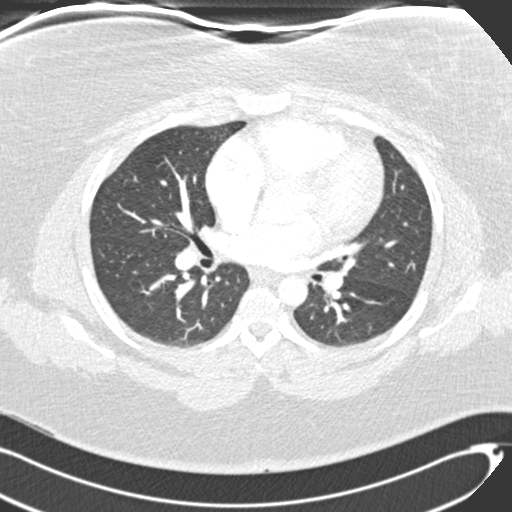
[im 169/278  soft-tissue]
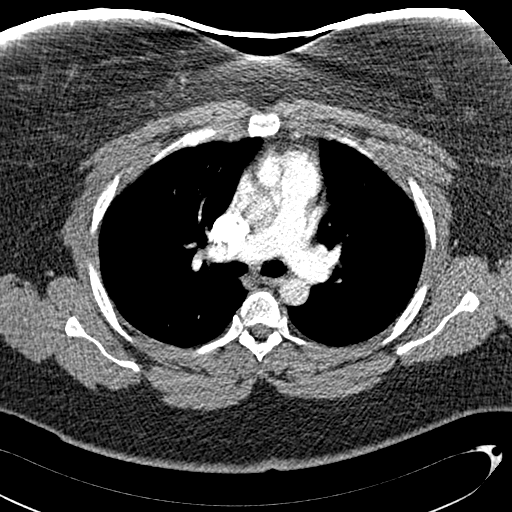
[im 181/278  lung]
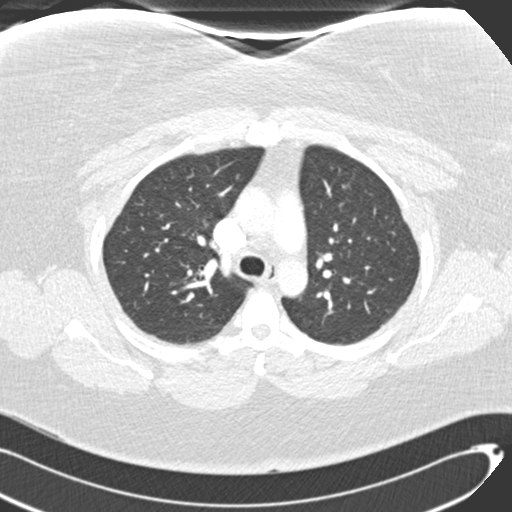
[im 193/278  soft-tissue]
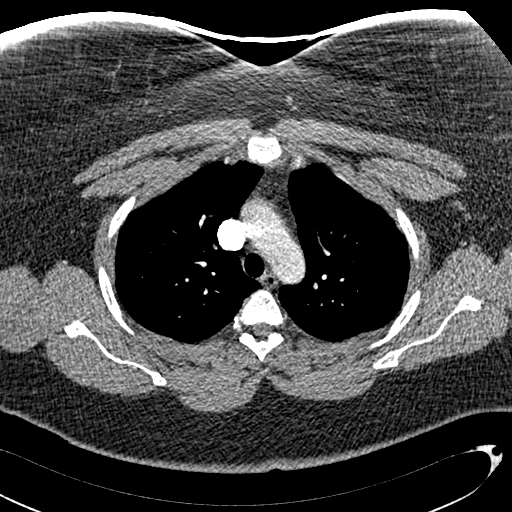
[im 217/278  lung]
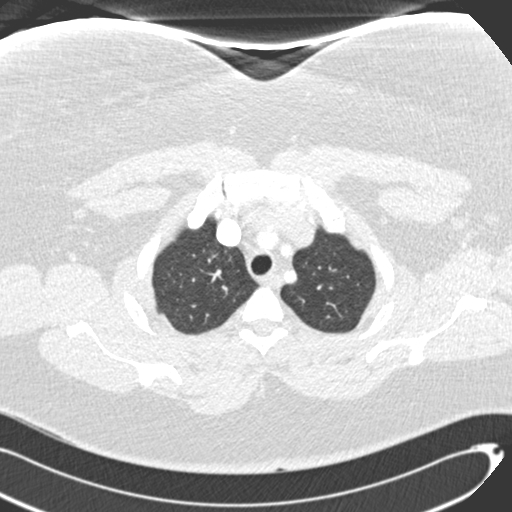
[im 229/278  soft-tissue]
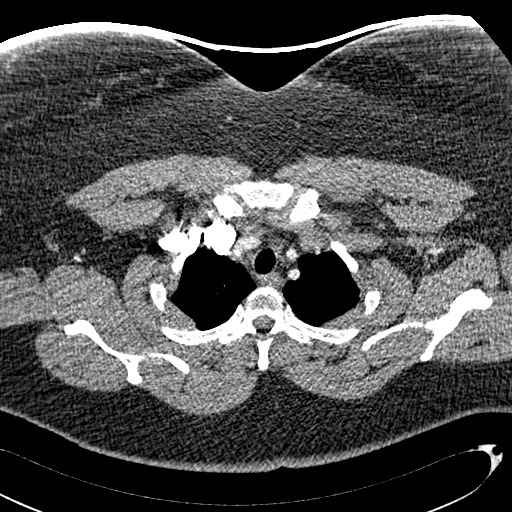
[im 241/278  lung]
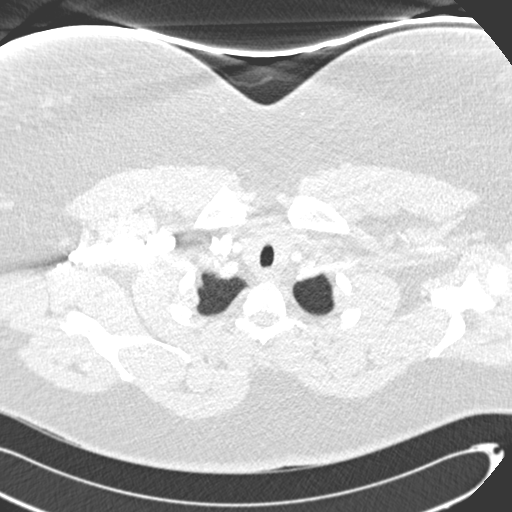
[im 265/278  soft-tissue]
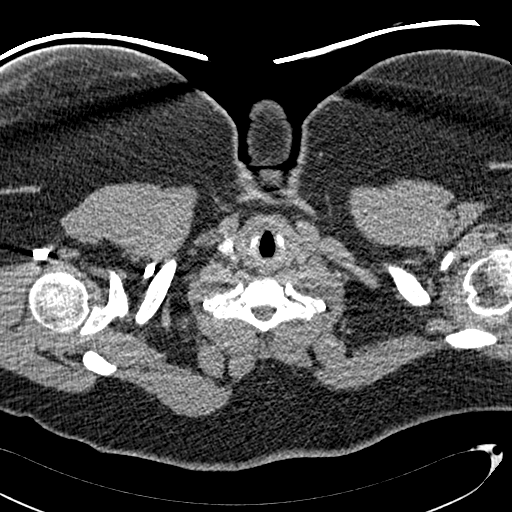

[Series 604: <mpr thick range> · coronal · 0.74mm/px · 3 of 113 slices shown]
[im 29/113  soft-tissue]
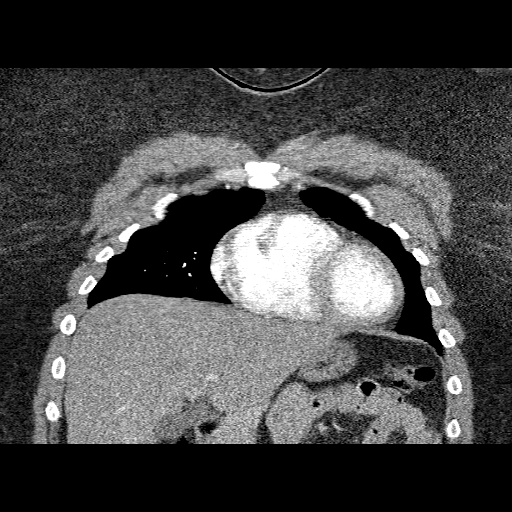
[im 57/113  soft-tissue]
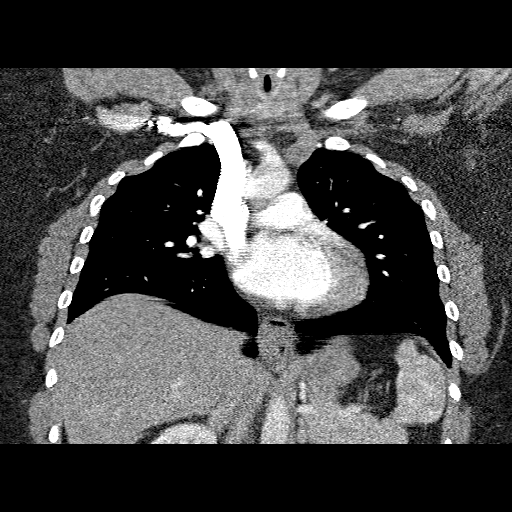
[im 85/113  soft-tissue]
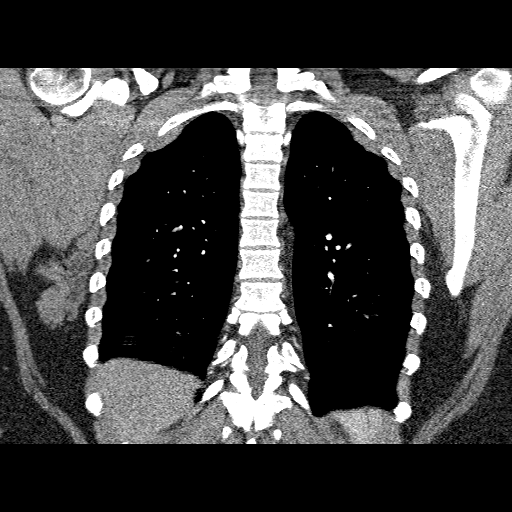

[19 of 46 positions shown; findings below may reference images not displayed]

FINDINGS: The chest wall is unremarkable.  The bony thorax is
intact.  The heart is normal in size.  No pericardial effusion.  No
mediastinal or hilar adenopathy.  The esophagus is grossly normal.
There is a small hiatal hernia.

The aorta is normal in caliber.  No dissection.  The pulmonary
arterial tree is well opacified.  No filling defects to suggest
pulmonary emboli.

Examination of the lung parenchyma demonstrates no acute pulmonary
findings.  No pleural effusions.  No pulmonary nodules.

The upper abdomen is unremarkable.

Review of the MIP images confirms the above findings.
IMPRESSION: 1.  No CT findings for pulmonary embolism.
2.  Normal thoracic aorta.
3.  No acute pulmonary findings.

## 2011-01-12 ENCOUNTER — Encounter: Payer: Self-pay | Admitting: Surgery

## 2011-01-19 ENCOUNTER — Emergency Department (HOSPITAL_COMMUNITY)
Admission: EM | Admit: 2011-01-19 | Discharge: 2011-01-19 | Payer: Self-pay | Source: Home / Self Care | Admitting: Emergency Medicine

## 2011-01-19 LAB — CONVERTED CEMR LAB
ALT: 14 units/L (ref 0–35)
AST: 22 units/L (ref 0–37)
Albumin: 4.3 g/dL (ref 3.5–5.2)
Alkaline Phosphatase: 76 units/L (ref 39–117)
BUN: 10 mg/dL (ref 6–23)
CO2: 21 meq/L (ref 19–32)
Calcium: 9.2 mg/dL (ref 8.4–10.5)
Chloride: 102 meq/L (ref 96–112)
Cholesterol: 145 mg/dL (ref 0–200)
Creatinine, Ser: 0.98 mg/dL (ref 0.40–1.20)
Glucose, Bld: 77 mg/dL (ref 70–99)
HCT: 58 %
HDL: 37 mg/dL — ABNORMAL LOW (ref >39)
Hgb A1c MFr Bld: 5.7 %
LDL Cholesterol: 91 mg/dL (ref 0–99)
Potassium: 4.2 meq/L (ref 3.5–5.3)
Sodium: 139 meq/L (ref 135–145)
TSH: 2.96 microintl units/mL (ref 0.350–4.500)
Total Bilirubin: 0.6 mg/dL (ref 0.3–1.2)
Total CHOL/HDL Ratio: 3.9
Total Protein: 7.3 g/dL (ref 6.0–8.3)
Triglycerides: 86 mg/dL (ref <150)
VLDL: 17 mg/dL (ref 0–40)
Vit D, 25-Hydroxy: 14 ng/mL — ABNORMAL LOW (ref 30–89)

## 2011-01-19 LAB — PROTIME-INR
INR: 1.01 (ref 0.00–1.49)
Prothrombin Time: 13.5 seconds (ref 11.6–15.2)

## 2011-01-19 LAB — POCT CARDIAC MARKERS
CKMB, poc: <1 ng/mL — ABNORMAL LOW (ref 1.0–8.0)
CKMB, poc: <1 ng/mL — ABNORMAL LOW (ref 1.0–8.0)
Myoglobin, poc: 53.5 ng/mL (ref 12–200)
Myoglobin, poc: 58.6 ng/mL (ref 12–200)
Troponin i, poc: <0.05 ng/mL (ref 0.00–0.09)
Troponin i, poc: <0.05 ng/mL (ref 0.00–0.09)

## 2011-01-19 LAB — DIFFERENTIAL
Basophils Absolute: 0 10*3/uL (ref 0.0–0.1)
Basophils Relative: 0 % (ref 0–1)
Eosinophils Absolute: 0.2 10*3/uL (ref 0.0–0.7)
Eosinophils Relative: 1 % (ref 0–5)
Lymphocytes Relative: 24 % (ref 12–46)
Lymphs Abs: 4 10*3/uL (ref 0.7–4.0)
Monocytes Absolute: 1.7 10*3/uL — ABNORMAL HIGH (ref 0.1–1.0)
Monocytes Relative: 10 % (ref 3–12)
Neutro Abs: 10.6 10*3/uL — ABNORMAL HIGH (ref 1.7–7.7)
Neutrophils Relative %: 65 % (ref 43–77)

## 2011-01-19 LAB — CBC
HCT: 39.2 % (ref 36.0–46.0)
Hemoglobin: 13.9 g/dL (ref 12.0–15.0)
MCH: 26.3 pg (ref 26.0–34.0)
MCHC: 35.5 g/dL (ref 30.0–36.0)
MCV: 74.1 fL — ABNORMAL LOW (ref 78.0–100.0)
Platelets: 280 10*3/uL (ref 150–400)
RBC: 5.29 MIL/uL — ABNORMAL HIGH (ref 3.87–5.11)
RDW: 13.9 % (ref 11.5–15.5)
WBC: 16.5 10*3/uL — ABNORMAL HIGH (ref 4.0–10.5)

## 2011-01-19 LAB — BASIC METABOLIC PANEL
BUN: 16 mg/dL (ref 6–23)
CO2: 28 mEq/L (ref 19–32)
Calcium: 9.2 mg/dL (ref 8.4–10.5)
Chloride: 103 mEq/L (ref 96–112)
Creatinine, Ser: 1.3 mg/dL — ABNORMAL HIGH (ref 0.4–1.2)
GFR calc Af Amer: 56 mL/min — ABNORMAL LOW (ref >60)
GFR calc non Af Amer: 47 mL/min — ABNORMAL LOW (ref >60)
Glucose, Bld: 104 mg/dL — ABNORMAL HIGH (ref 70–99)
Potassium: 4 mEq/L (ref 3.5–5.1)
Sodium: 138 mEq/L (ref 135–145)

## 2011-01-19 IMAGING — CR DG CHEST 1V PORT
1 series · 1 of 1 positions shown · non-contrast
Comparison: [DATE]

CLINICAL DATA: Chest pain radiating to left arm.  Visual
disturbance.

CHEST - 1 VIEW

[AP]
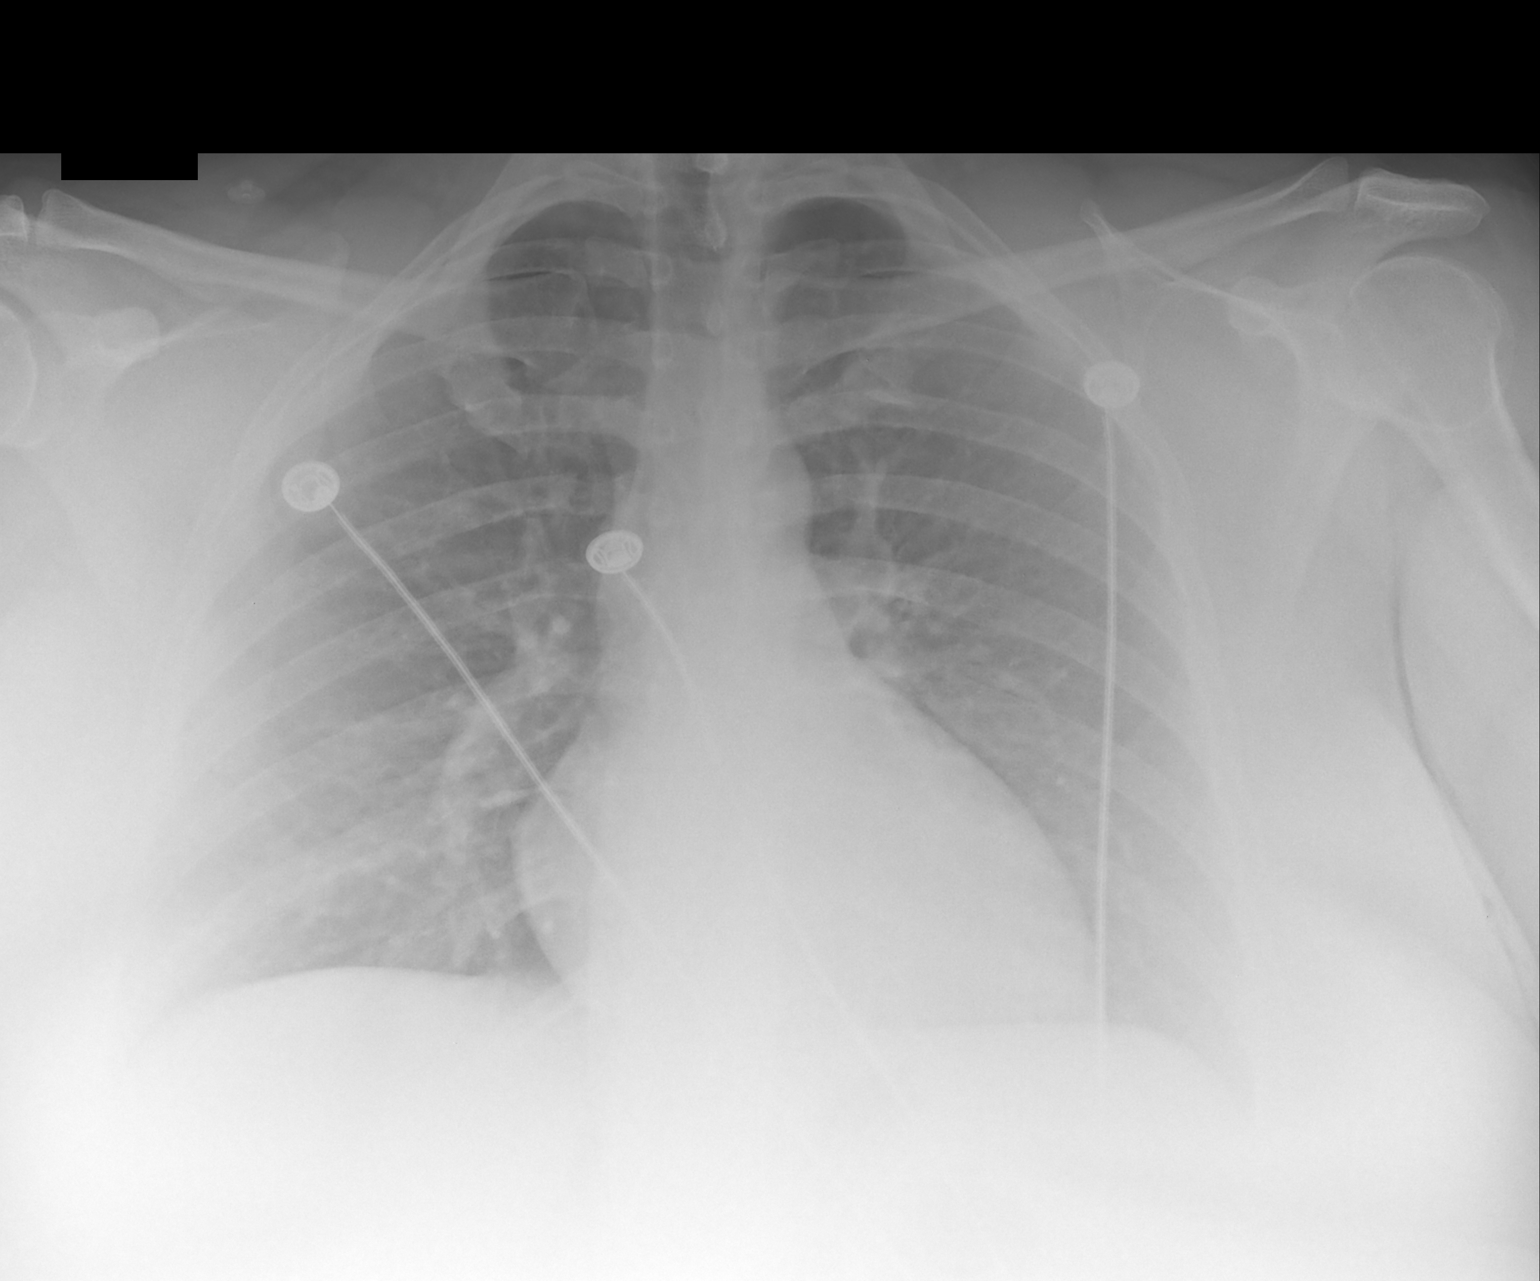

[1 of 1 positions shown; findings below may reference images not displayed]

FINDINGS: The heart size and mediastinal contours are within normal
limits.  Both lungs are clear.
IMPRESSION: No active disease.

## 2011-01-23 NOTE — Letter (Signed)
Summary: *HSN Results Follow up  [facility]  [address] [place], [place] [address]   Phone: [phone]  Fax: [phone]      11/01/2009   KAIJAH ABTS [address] [place], [place]  [address]   Dear  [name],                            ____S.Drinkard,FNP   ____D. Gore,FNP       ____B. McPherson,MD   ____V. Rankins,MD    ____E. Mulberry,MD    ____N. Daphine Deutscher, FNP  ____D. Reche Dixon, MD    ____K. Philipp Deputy, MD    ____Other     This letter is to inform you that your recent test(s):  _______Pap Smear    _______Lab Test     _______X-ray    _______ is within acceptable limits  _______ requires a medication change  _______ requires a follow-up lab visit  _______ requires a follow-up visit with your provider   Comments:       _________________________________________________________ If you have any questions, please contact our office                     Sincerely,  Syliva Overman MD [facility]

## 2011-01-23 NOTE — Assessment & Plan Note (Signed)
Summary: EAR INFECTION & F/U PREVIOUS VISIT  / NS   Vital Signs:  Patient profile:   35 year old female Height:      66.5 inches Weight:      380.8 pounds Temp:     98.3 degrees F oral Pulse rate:   93 / minute Pulse rhythm:   regular Resp:     20 per minute BP sitting:   112 / 70  (left arm) Cuff size:   large  Vitals Entered By: Mikey College CMA (December 03, 2009 9:07 AM) CC: pt here f/u ears and states that she feels like fluid is in both ears.... Is Patient Diabetic? No Pain Assessment Patient in pain? yes     Location: ears Intensity: 4  Does patient need assistance? Functional Status Self care Ambulation Normal   Visit Type:  f/u, acute problem Primary Provider:  Carolyne Fiscal  CC:  pt here f/u ears and states that she feels like fluid is in both ears.....  History of Present Illness: Palpitations, none since last visit, no longer taking Diltiazem or phentermine. HTN. BPs at home around 120/70 consistently. States not taking any medicaitons at this time. Low vitamin d on testing. lvl 14. Not taking MTV, poor sun exposure. Depression. No SI/HI, states doing well without medications, discussed diet and exercise. Obesity. Diet and exercise, currently not taking any steps, discussed along with nutrition/exercise diary to be kept. Pt is aware of potential health risks.  Pt c/o bilateral ear pressure. Quality: pressure Severity: 4-8/10 Associated S/S: +congestion, +allergic rhinitis Timing: consistent, improved today Context: worse if lying down or having in headphones Duration: 6 days Improves: nothing Worsens: lying down or having head phones in Denies: previously having issues, no sore throat, or itching/pain in canals, she is not taking OTC anti histamines regularly and has not tried any treatments    Allergies: 1)  ! * Latex PMH-FH-SH reviewed-no changes except otherwise noted  Family History: Reviewed history from 10/31/2009 and no changes required. Family  History Diabetes 1st degree relative Family History Thyroid disease - Hypothyroidism in mother Family History of Cardiovascular disorder - Father MI @50   Social History: Reviewed history and no changes required.  Review of Systems General:  Denies chills, fatigue, and fever. ENT:  Complains of earache and nasal congestion; denies decreased hearing, difficulty swallowing, ear discharge, nosebleeds, postnasal drainage, ringing in ears, sinus pressure, and sore throat. CV:  Denies chest pain or discomfort, fainting, palpitations, and shortness of breath with exertion. Resp:  Denies chest discomfort, cough, and wheezing.  Physical Exam  General:  Well-developed,well-nourished,in no acute distress; alert,appropriate and cooperative throughout examination Head:  Normocephalic and atraumatic without obvious abnormalities. No apparent alopecia or balding. Eyes:  No corneal or conjunctival inflammation noted. EOMI. Perrla. Funduscopic exam benign, without hemorrhages, exudates or papilledema. Vision grossly normal. Ears:  R ear possible increased pressure without erythema, inflammation or thickened TM. L ear with mild thickening, no bulging, inflammation, unable to see fluid, but possible due to thickening. Canals normal Nose:  External nasal examination shows no deformity or inflammation. Nasal mucosa are erythemetous with mild mucosal swelling, mucus production and excoriation on inner L septum, not currently bleeding. Mouth:  Oral mucosa and oropharynx without lesions or exudates.  Teeth in good repair. Neck:  No deformities, masses, or tenderness noted. Lungs:  Normal respiratory effort, chest expands symmetrically. Lungs are clear to auscultation, no crackles or wheezes. Heart:  Normal rate and regular rhythm. S1 and S2 normal without gallop, murmur, click,  rub or other extra sounds. Abdomen:  Bowel sounds positive,abdomen soft and non-tender without masses, organomegaly or hernias noted. +  obesity. Neurologic:  Hearing grossly normal bilaterally. Psych:  Cognition and judgment appear intact. Alert and cooperative with normal attention span and concentration. No apparent delusions, illusions, hallucinations   Impression & Recommendations:  Problem # 1:  EAR PAIN, BILATERAL (ICD-388.70) Likely presence of increased pressure without infection evident on R with potential serous otitis on L without visualization due to mild thickening of TM without signs of infection.  Zyrtec Rx for flonase given to fill tomorrow if not improved or if worsens.  Problem # 2:  ALLERGIC RHINITIS (ICD-477.9) Assessment: Unchanged  Her updated medication list for this problem includes:    Zyrtec Allergy 10 Mg Tabs (Cetirizine hcl) .Marland Kitchen... 1 by mouth daily as needed congestion associated with allergies    Flonase 50 Mcg/act Susp (Fluticasone propionate) .Marland Kitchen... 2 puffs per nostril daily  Problem # 3:  GERD (ICD-530.81) Assessment: Improved Took a 7 day course of omeprazole that she had at home, improved. Currently no complaint.  Problem # 4:  BLOCK, OTHER HEART (ICD-426.6) 1st degree A-V block on ECG at last visit. Had taken diltiazem that morning, since has not taken, no palpitations. Could be strictly due to diltiazem, consider ECG in future to re-evaluate.  Problem # 5:  PALPITATIONS (ICD-785.1) Assessment: Improved Has not experienced any, could have been due to phentermine that she was taking at the time. She has not had any more since quitting the diltiazem.  Problem # 6:  DEPRESSION (ICD-311) Assessment: Unchanged Pt with h/o. Recent history without, only occasionally gets "down" No SI/HI. Previously improved with paxil. Current treatment, diet and exercise.  Problem # 7:  HYPERTENSION, BENIGN ESSENTIAL (ICD-401.1) Pt has been normotensive without medications, previously was taking weight loss supplement phentermine, unknown if the time coincided with HTN diagnosis. Currently not taking  any medications and BP <140/90.  The following medications were removed from the medication list:    Hydrochlorothiazide 25 Mg Tabs (Hydrochlorothiazide) .Marland Kitchen... Take one tablet by mouth once daily    Cardizem Cd 120 Mg Xr24h-cap (Diltiazem hcl coated beads) .Marland Kitchen... Take 1 tablet by mouth once a day  Problem # 8:  VITAMIN D DEFICIENCY (ICD-268.9) Assessment: New Rx given today. Lvl 14. Also to begin taking MTV.  Problem # 9:  OBESITY (ICD-278.00) Pt not eating good diet or exercising. Discussed keeping a food and/or exercise diary to identify problem areas.  Problem # 10:  Preventive Health Care (ICD-V70.0) Pt should receive HIV testing, flu shot, ?TD and pap at [facility] in one month.  Complete Medication List: 1)  Ergocalciferol 50000 Unit Caps (Ergocalciferol) .Marland Kitchen.. 1 by mouth weekly x 12 weeks 2)  Zyrtec Allergy 10 Mg Tabs (Cetirizine hcl) .Marland Kitchen.. 1 by mouth daily as needed congestion associated with allergies 3)  Flonase 50 Mcg/act Susp (Fluticasone propionate) .... 2 puffs per nostril daily  Patient Instructions: 1)  Please schedule a follow-up appointment in 1-2  months for [facility]. 2)  It is important that you exercise regularly at least 20 minutes 5 times a week. If you develop chest pain, have severe difficulty breathing, or feel very tired , stop exercising immediately and seek medical attention. 3)  You need to lose weight. Consider a lower calorie diet and regular exercise.  4)  Take a multivitamin with 0.4 mg folic acid and 400IU vitamin D every day. 5)  Check your Blood Pressure regularly. If it is  above 140/90 you should make an appointment. 6)  Take over the counter cough and cold medications only as directed on the package insert. DO NOT take more than recommended . Prescriptions: FLONASE 50 MCG/ACT SUSP (FLUTICASONE PROPIONATE) 2 puffs per nostril daily  #1 x 5   Entered and Authorized by:   Syliva Overman MD   Signed by:   Syliva Overman MD on 12/03/2009   Method used:   Print  then Give to Patient   RxID:   [id number] ERGOCALCIFEROL 50000 UNIT CAPS (ERGOCALCIFEROL) 1 by mouth weekly x 12 weeks  #12 x 0   Entered and Authorized by:   Syliva Overman MD   Signed by:   Syliva Overman MD on 12/03/2009   Method used:   Print then Give to Patient   RxID:   [id number]

## 2011-01-23 NOTE — Progress Notes (Signed)
Summary: pt b/p elevated and having palpitations  Medications Added CARDIZEM CD 120 MG XR24H-CAP (DILTIAZEM HCL COATED BEADS) Take 1 tablet by mouth once a day       Phone Note Call from Patient Call back at Home Phone [phone]   Caller: Patient Reason for Call: Talk to Nurse Summary of Call: per pt call she took b/p yesterday 152/112 pt started taking cardizem but is out of date can you call some in for her if you want her to take it this am b/p 140/100 pt been walking for two weeks and been ok while doing so still havoing palpitations and still taking hctz pt uses [facility] [address] should she still exercise Initial call taken by: Omer Jack,  August 10, 2009 8:12 AM  Follow-up for Phone Call        Left message to call back Meredith Staggers, RN  August 10, 2009 8:41 AM  spoke w/pt, she has been working out, walking about 5-6 miles a day and has been ok till last pm she was very fatigued and had palps she checked her BP and it was 152/112 she took a cardizem and feels better now, her cardizem is expired so she would like a new rx, she will take it if symptoms return she will c/b Meredith Staggers, RN  August 10, 2009 9:53 AM     New/Updated Medications: CARDIZEM CD 120 MG XR24H-CAP (DILTIAZEM HCL COATED BEADS) Take 1 tablet by mouth once a day Prescriptions: CARDIZEM CD 120 MG XR24H-CAP (DILTIAZEM HCL COATED BEADS) Take 1 tablet by mouth once a day  #30 x 6   Entered by:   Meredith Staggers, RN   Authorized by:   Dolores Patty, MD, [facility]   Signed by:   Meredith Staggers, RN on 08/10/2009   Method used:   Electronically to        [facility]  [phone]* (retail)       [address]       [place], [place]  [address]       Ph: [phone] or [phone]       Fax: [phone]   RxID:   [id number]

## 2011-01-23 NOTE — Letter (Signed)
Summary: NUTRITION SUMMARY/SUSIE  NUTRITION SUMMARY/SUSIE   Imported By: Arta Bruce 07/01/2010 09:48:18  _____________________________________________________________________  External Attachment:    Type:   Image     Comment:   External Document

## 2011-01-23 NOTE — Letter (Signed)
Summary: *HSN Results Follow up  [facility]  [address] [place], [place] [address]   Phone: [phone]  Fax: [phone]      11/01/2009   Krista Fisher [address] [place], [place]  [address]   Dear  [name],                            ____S.Drinkard,FNP   ____D. Gore,FNP       ____B. McPherson,MD   ____V. Rankins,MD    ____E. Mulberry,MD    ____N. Daphine Deutscher, FNP  ____D. Reche Dixon, MD    ____K. Philipp Deputy, MD    __X__ E. Carolyne Fiscal, MD     This letter is to inform you that your recent test(s):  _______Pap Smear    ___X____Lab Test     _______X-ray     Comments:  Lab results returned and no major findings, however please keep your follow up appointment so that we can discuss minor findings. _________________________________________________________ If you have any questions, please contact our office at [phone]                    Sincerely,  Syliva Overman MD [facility]

## 2011-01-23 NOTE — Progress Notes (Signed)
Summary: med question   Phone Note Call from Patient Call back at Home Phone [phone]   Caller: Patient Reason for Call: Talk to Nurse Summary of Call:  wants to know if the dr will write her a rx for phentermine  Initial call taken by: Migdalia Dk,  February 07, 2010 3:42 PM  Follow-up for Phone Call        pt is aware we do not prescribe med Meredith Staggers, RN  February 07, 2010 5:21 PM

## 2011-01-23 NOTE — Progress Notes (Signed)
Summary: pt needs a note to go on her cruise   Phone Note Call from Patient Call back at [facility] Phone [phone]   Caller: Patient Reason for Call: Talk to Nurse, Talk to Doctor Summary of Call: pt is going on a cruise and needs a note stating her medications that have been perscribed and she needs a note stating she needs bottled water please call patient for further clarification Initial call taken by: Omer Jack,  September 26, 2009 9:29 AM  Follow-up for Phone Call        spoke w/pt, letter at front desk for her to p/u Meredith Staggers, RN  September 26, 2009 2:11 PM

## 2011-01-23 NOTE — Letter (Signed)
Summary: REQUESTED RECORD OF LAST VISIT THRU 12/03/2010  REQUESTED RECORD OF LAST VISIT THRU 12/03/2010   Imported By: Silvio Pate Stanislawscyk 12/11/2009 12:31:17  _____________________________________________________________________  External Attachment:    Type:   Image     Comment:   External Document

## 2011-01-23 NOTE — Progress Notes (Signed)
   Phone Note Call from Patient   Summary of Call: The last time she came here the provider (Dr. Carolyne Fiscal)  suggested her to take Vitamin D but she lost the prescription so the pt needs another prescription and she also needs refills from phentermine..  [facility] [address]).  Pt never seen any other provider besides Dr Carolyne Fiscal. Initial call taken by: Manon Hilding,  January 31, 2010 8:57 AM  Follow-up for Phone Call        pt wanted a prescription for the phentermine... pt says Dr. Carolyne Fiscal was going to prescribe to pt to help lose weight...Marland KitchenMarland KitchenMarland Kitchen  Follow-up by: Armenia Shannon,  January 31, 2010 9:10 AM  Additional Follow-up for Phone Call Additional follow up Details #1::        Notes indicate that Dr. Carolyne Fiscal advised her to stop phentermine due to side effects.   Would not recommend she be on this medicine. Can refer her to dietician if she would like.  Will send vitamin D to [facility] on [address]. Recheck vitamin D level in 3 mos. Additional Follow-up by: Tereso Newcomer PA-C,  January 31, 2010 9:20 AM    Additional Follow-up for Phone Call Additional follow up Details #2::    pt says she was having side effect to the generic cardizem and not the phentermine...pt says the dietican is okay but she wanted you to know that.Marland KitchenMarland KitchenMarland KitchenArmenia Shannon  January 31, 2010 9:44 AM   Additional Follow-up for Phone Call Additional follow up Details #3:: Details for Additional Follow-up Action Taken: Side effects in the notes would be common with a stimulant like phentermine. . . not cardizem.  If Susie is able to see non-diabetics, have her schedule an appt with Susie Piper.  spoke with pt and she is aware... Armenia Shannon  February 04, 2010 11:03 AM   Additional Follow-up by: Tereso Newcomer PA-C,  January 31, 2010 2:46 PM  Prescriptions: ERGOCALCIFEROL 50000 UNIT CAPS (ERGOCALCIFEROL) 1 by mouth weekly x 12 weeks  #12 x 0   Entered and Authorized by:   Tereso Newcomer PA-C   Signed by:   Tereso Newcomer  PA-C on 01/31/2010   Method used:   Electronically to        [facility]  [phone]* (retail)       [address]       [place], [place]  [address]       Ph: [phone] or [phone]       Fax: [phone]   RxID:   [id number]

## 2011-01-23 NOTE — Progress Notes (Signed)
Summary: Records Request   Faxed EKG to [facility] ([phone]). Debby Freiberg  November 05, 2010 12:38 PM

## 2011-01-23 NOTE — Assessment & Plan Note (Signed)
Summary: new pt establish care/////rjp   Vital Signs:  Patient profile:   35 year old female Height:      66.5 inches Weight:      371 pounds BMI:     59.20 Temp:     98.5 degrees F oral Pulse rate:   90 / minute Pulse rhythm:   regular Resp:     20 per minute BP sitting:   119 / 73  (left arm) Cuff size:   large  Vitals Entered By: [place] Shannon (October 31, 2009 9:04 AM)  CC: NP.....Marland Kitchen pt did not have any concerns besides her weight..., C-V Risk Management, Hypertension Management, Abdominal Pain Is Patient Diabetic? No Pain Assessment Patient in pain? yes     Location: back Intensity: 8 Type: aching Onset of pain  With activity  Does patient need assistance? Functional Status Self care Ambulation Normal   CC:  NP.....Marland Kitchen pt did not have any concerns besides her weight..., C-V Risk Management, Hypertension Management, and Abdominal Pain.  Cardiovascular Risk History:      Positive major cardiovascular risk factors include hypertension and family history for ischemic heart disease (males less than 55 years old).  Negative major cardiovascular risk factors include female age less than 49 years old and non-tobacco-user status.    Dyspepsia History:      She has no alarm features of dyspepsia including no history of melena, hematochezia, dysphagia, persistent vomiting, or involuntary weight loss > 5%.  There is a prior history of GERD.  She notes that it has been less than 12 months since the last episode of GERD.  The patient does not have a prior history of documented ulcer disease.  The dominant symptom is not heartburn or acid reflux.  An H-2 blocker medication is currently being taken.  She notes that the symptoms have improved with the H-2 blocker therapy.  Symptoms have persisted after 4 weeks of H-2 blocker treatment.    Hypertension History:      She complains of chest pain, palpitations, and peripheral edema, but denies headache, dyspnea with exertion, orthopnea, PND,  visual symptoms, neurologic problems, syncope, and side effects from treatment.  She notes no problems with any antihypertensive medication side effects.  Further comments include: Pt with HTN diagnosed years ago and previously treated with [facility]. Previously taking HCTZ 25 and Diltiazem 120 daily. Pt states did not take over past week, but did take this am. Unknown what her blood pressure was over past week or previously.  Occasionally feels palpitations after working out and when quiet at night, no other symptoms associated with this except for mild chest discomfort without dyspnea radiation, no exacerbation during exercise or relief with rest. Peripheral edema only after being on feet, mild, resolves overnight. Waking at night, can't see well out of L eye, goes away rapidly.        Positive major cardiovascular risk factors include hypertension and family history for ischemic heart disease (males less than 78 years old).  Negative major cardiovascular risk factors include female age less than 37 years old and non-tobacco-user status.       Current Medications (verified): 1)  Hydrochlorothiazide 25 Mg Tabs (Hydrochlorothiazide) .... Take One Tablet By Mouth Once Daily 2)  Cardizem Cd 120 Mg Xr24h-Cap (Diltiazem Hcl Coated Beads) .... Take 1 Tablet By Mouth Once A Day  Allergies (verified): 1)  ! 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 Home Does Patient Exercise:  yes- not regular Dental Care w/in 6 mos.:  no Sun Exposure-Excessive:  no Fall Risk:  no falls in past year Risk analyst Use:  yes Alcohol:  None Caffeine use/day:  Denies Smoking Status:  never Passive Smoke Exposure:  no Sex Orientation:  Heterosexual HIV Risk:  risk noted- h/o chlamydia  ~1995 Hepatitis Risk:  risk noted STD Risk:  risk noted Sexual History:  multiple partners in the past Drug Use:  never Blood Transfusions:  no  Review of Systems       The patient complains of weight gain, vision loss, peripheral edema, severe indigestion/heartburn, and depression.  The patient denies anorexia, fever, weight loss, decreased hearing, hoarseness, chest pain, syncope, dyspnea on exertion, prolonged cough, headaches, hemoptysis, abdominal pain, melena, hematochezia, hematuria, incontinence, genital sores, muscle weakness, suspicious skin lesions, transient blindness, difficulty walking, unusual weight change, abnormal bleeding, enlarged lymph nodes, angioedema, and breast masses.         See HPI and A/P wt gain 15 lbs in last 6 months Heartburn constant, +belching with burning/acidic sensation, worse with eating, ? worse with lying down, no abdominal burning Vision difficulty described as unable to see in L eye only on waking in middle of night, resolves immediately, no headaches, occurs at no other time.   Physical Exam  General:  alert, well-developed, well-nourished, well-hydrated, appropriate dress, normal appearance, healthy-appearing, cooperative to examination, good hygiene, and overweight-appearing.   Head:  Normocephalic and atraumatic without obvious abnormalities. No apparent alopecia or balding. Eyes:  No corneal or conjunctival inflammation noted. EOMI. Perrla. Funduscopic exam benign, without hemorrhages, exudates or papilledema. Vision grossly normal. Ears:  External ear exam shows no significant lesions or deformities.  Otoscopic  examination reveals clear canals, tympanic membranes are intact bilaterally without bulging, retraction, inflammation or discharge. Hearing is grossly normal bilaterally. Nose:  External nasal examination shows no deformity or inflammation. Nasal mucosa are pink and moist without lesions or exudates. Mouth:  Oral mucosa and oropharynx without lesions or exudates.  Teeth in good repair. Neck:  No deformities, masses, or tenderness noted. Chest Wall:  No deformities, masses, or tenderness noted. Lungs:  Normal respiratory effort, chest expands symmetrically. Lungs are clear to auscultation, no crackles or wheezes. Heart:  normal rate and no murmur.  Occasional arrythmia, likely PAC. Abdomen:  soft, non-tender, normal bowel sounds, no distention, no masses, and no hepatomegaly.  Exam limited by habitus Msk:  normal ROM, no joint tenderness, and no joint swelling.   Extremities:  Trace pedal edema bilaterally Neurologic:  alert & oriented X3, cranial nerves II-XII intact, strength normal in all extremities, and gait normal.   Skin:  turgor normal and color normal.   Cervical Nodes:  no anterior cervical adenopathy and no posterior cervical adenopathy.   Psych:  Oriented X3, normally interactive, good eye contact, not anxious appearing, and not depressed appearing.     Problems:  Medical Problems Added: 1)  Dx of Gerd  (ICD-530.81) 2)  Dx of Block, Other Heart  (ICD-426.6) 3)  Dx of Sleep Apnea  (ICD-780.57)  4)  Dx of Palpitations  (ICD-785.1) 5)  Dx of Uri  (ICD-465.9) 6)  Dx of Obesity  (ICD-278.00) 7)  Dx of Depression  (ICD-311) 8)  Dx of Family History Diabetes 1st Degree Relative  (ICD-V18.0) 9)  Dx of Hypertension, Benign Essential  (ICD-401.1)  Impression & Recommendations:  Problem # 1:  HYPERTENSION (ICD-401.9) Diagnosed and previously treated at [facility] with HCTZ and Diltiazem. Has not taken medications for last week, but took HCTZ, Diltiazem this morning. She has not  been monitoring her BP at home. Family h/o MI in father <55 2007 - Treadmill myoview with normal perfusion and EF 61% Her updated medication list for this problem includes:    Hydrochlorothiazide 25 Mg Tabs (Hydrochlorothiazide) .Marland Kitchen... Take one tablet by mouth once daily    Cardizem Cd 120 Mg Xr24h-cap (Diltiazem hcl coated beads) .Marland Kitchen... Take 1 tablet by mouth once a day  Orders: EKG w/ Interpretation (93000) T-TSH ([id number]) T-Vitamin D (25-Hydroxy) (10272-53664) Pt has BP monitor at home and will check daily without BP meds. To take HCTZ if elevated, call if continues to be elevated. Pt to keep record and bring to return visit in 2 weeks. Holding Diltiazem due to first degree AV block.  Problem # 2:  DEPRESSION (ICD-311) Pt with h/o. Recent history without, only occasionally gets "down" No SI/HI. Previously improved with paxil.  Orders: Hgb A1C (40347QQ) Hgb (59563) T-TSH [phone]) T-Vitamin D (25-Hydroxy) (18841-66063)  Problem # 3:  OBESITY (ICD-278.00) Regular exercise and diet advised and counselled. Pt to start exercise slow and include low sodium with diet. Previously on Phentermine, advised D/C due to history of palpitations. Consider nutrition/exercise diary at next visit.  Orders: Hgb A1C (01601UX) T-Comprehensive Metabolic Panel [phone]) T-Lipid Profile [phone]) T-TSH [phone]) T-Vitamin D (25-Hydroxy) [phone])  Problem # 4:  PALPITATIONS (ICD-785.1) Last a few months ago. Was taking diltiazem which improved the symptoms, not taken med in last week, no palpations during that time. +GERD. 2007 Holter with occ PACs and PVCs without sig arrythmia.   Orders: EKG w/ Interpretation (93000) - 1st degree A-V block, RRR. PR [phone]. Pt to hold diltiazem until next visit in two weeks, call if palpitations return.  Problem # 5:  GERD (ICD-530.81) Symptoms include acidic feeling up chest and taste in mouth, worse after meals, not resolved with Pepcid  previously controlled with protonix.  Rx: Omeprazole x 4 weeks, then reassess.  Problem # 6:  Preventive Health Care (ICD-V70.0) Pt should receive HIV testing, flu shot, ?TD and pap in two weeks at well woman visit.  Complete Medication List: 1)  Hydrochlorothiazide 25 Mg Tabs (Hydrochlorothiazide) .... Take one tablet by mouth once daily 2)  Cardizem Cd 120 Mg Xr24h-cap (Diltiazem hcl coated beads) .... Take 1 tablet by mouth once a day 3)  Omeprazole 20 Mg Cpdr (Omeprazole) .Marland Kitchen.. 1 each day 30 minutes before meal x 4 weeks  Cardiovascular Risk Assessment/Plan:      The patient's hypertensive risk group is category B: At least one risk factor (excluding diabetes) with no target organ damage.  Today's blood pressure is 119/73.    Hypertension Assessment/Plan:      The patient's hypertensive risk group is category B: At least one risk factor (excluding diabetes) with no target organ damage.  Today's blood pressure is 119/73.    Patient Instructions: 1)  Please schedule a follow-up appointment in 2 weeks. 2)  Limit your Sodium (Salt). 3)  It is important that you exercise regularly at least 20  minutes 5 times a week. If you develop chest pain, have severe difficulty breathing, or feel very tired , stop exercising immediately and seek medical attention. 4)  You need to lose weight. Consider a lower calorie diet and regular exercise.  5)  You need to have a Pap Smear to prevent cervical cancer. 6)  Check your Blood Pressure regularly. If it is above: you should make an appointment. Prescriptions: OMEPRAZOLE 20 MG  CPDR (OMEPRAZOLE) 1 each day 30 minutes before meal x 4 weeks  #28 x 0   Entered and Authorized by:   Syliva Overman MD   Signed by:   Syliva Overman MD on 10/31/2009   Method used:   Print then Give to Patient   RxID:   [id number]   Laboratory Results   Blood Tests   Date/Time Received: October 31, 2009 11:56 AM   HGBA1C: 5.7%   (Normal Range: Non-Diabetic - 3-6%    Control Diabetic - 6-8%)   CBC   Hct:  58 %   (Normal Range: 36.0-46.0)

## 2011-01-23 NOTE — Letter (Signed)
Summary: Generic Letter  Architectural technologist, Main Office  [address]   [place], [place] [address]   Phone: [phone]  Fax: [phone]        September 26, 2009 MRN: [id number]    Krista Fisher [address] [place], [place]  [address]    To Whom it May Concern,  Krista Fisher is under my medical care and we have prescribed two medications (hctz and diltiazem) for Krista Fisher to take on a daily basis.  We would ask that you please allow Krista Fisher to bring her own bottled water from home for her trip.  If you have any questions please contact our office at [phone].    Sincerely,     Arvilla Meres, MD Meredith Staggers, RN  This letter has been electronically signed by your physician.

## 2011-01-23 NOTE — Letter (Signed)
Summary: MAILED REQUESTEDD RECORDS TO [facility]   MAILED REQUESTEDD RECORDS TO [facility]   Imported By: Arta Bruce 06/28/2010 12:02:35  _____________________________________________________________________  External Attachment:    Type:   Image     Comment:   External Document

## 2011-02-04 DIAGNOSIS — R079 Chest pain, unspecified: Secondary | ICD-10-CM | POA: Insufficient documentation

## 2011-02-04 DIAGNOSIS — R609 Edema, unspecified: Secondary | ICD-10-CM | POA: Insufficient documentation

## 2011-02-05 ENCOUNTER — Ambulatory Visit: Payer: Self-pay | Admitting: Internal Medicine

## 2011-03-04 LAB — SURGICAL PCR SCREEN
MRSA, PCR: NEGATIVE
Staphylococcus aureus: POSITIVE — AB

## 2011-03-04 LAB — BASIC METABOLIC PANEL
BUN: 8 mg/dL (ref 6–23)
CO2: 27 mEq/L (ref 19–32)
Calcium: 9.3 mg/dL (ref 8.4–10.5)
Chloride: 104 mEq/L (ref 96–112)
Creatinine, Ser: 0.88 mg/dL (ref 0.4–1.2)
GFR calc Af Amer: >60 mL/min (ref >60)
GFR calc non Af Amer: >60 mL/min (ref >60)
Glucose, Bld: 97 mg/dL (ref 70–99)
Potassium: 4.1 mEq/L (ref 3.5–5.1)
Sodium: 135 mEq/L (ref 135–145)

## 2011-03-04 LAB — POCT I-STAT, CHEM 8
BUN: 16 mg/dL (ref 6–23)
Calcium, Ion: 1.21 mmol/L (ref 1.12–1.32)
Chloride: 105 mEq/L (ref 96–112)
Creatinine, Ser: 1.1 mg/dL (ref 0.4–1.2)
Glucose, Bld: 98 mg/dL (ref 70–99)
HCT: 38 % (ref 36.0–46.0)
Hemoglobin: 12.9 g/dL (ref 12.0–15.0)
Potassium: 4 mEq/L (ref 3.5–5.1)
Sodium: 141 mEq/L (ref 135–145)
TCO2: 26 mmol/L (ref 0–100)

## 2011-03-04 LAB — DIFFERENTIAL
Basophils Absolute: 0 10*3/uL (ref 0.0–0.1)
Basophils Relative: 0 % (ref 0–1)
Eosinophils Absolute: 0.3 10*3/uL (ref 0.0–0.7)
Eosinophils Relative: 3 % (ref 0–5)
Lymphocytes Relative: 23 % (ref 12–46)
Lymphs Abs: 2.5 10*3/uL (ref 0.7–4.0)
Monocytes Absolute: 1.1 10*3/uL — ABNORMAL HIGH (ref 0.1–1.0)
Monocytes Relative: 10 % (ref 3–12)
Neutro Abs: 7.1 10*3/uL (ref 1.7–7.7)
Neutrophils Relative %: 64 % (ref 43–77)

## 2011-03-04 LAB — CBC
HCT: 35.9 % — ABNORMAL LOW (ref 36.0–46.0)
HCT: 37.4 % (ref 36.0–46.0)
Hemoglobin: 12.9 g/dL (ref 12.0–15.0)
Hemoglobin: 13.1 g/dL (ref 12.0–15.0)
MCH: 26.7 pg (ref 26.0–34.0)
MCH: 26.1 pg (ref 26.0–34.0)
MCHC: 35.9 g/dL (ref 30.0–36.0)
MCHC: 35 g/dL (ref 30.0–36.0)
MCV: 74.3 fL — ABNORMAL LOW (ref 78.0–100.0)
MCV: 74.7 fL — ABNORMAL LOW (ref 78.0–100.0)
Platelets: 242 10*3/uL (ref 150–400)
Platelets: 267 10*3/uL (ref 150–400)
RBC: 4.83 MIL/uL (ref 3.87–5.11)
RBC: 5.01 MIL/uL (ref 3.87–5.11)
RDW: 14.3 % (ref 11.5–15.5)
RDW: 14.1 % (ref 11.5–15.5)
WBC: 11 10*3/uL — ABNORMAL HIGH (ref 4.0–10.5)
WBC: 7.4 10*3/uL (ref 4.0–10.5)

## 2011-03-04 LAB — POCT CARDIAC MARKERS
CKMB, poc: <1 ng/mL — ABNORMAL LOW (ref 1.0–8.0)
Myoglobin, poc: 71.8 ng/mL (ref 12–200)
Troponin i, poc: <0.05 ng/mL (ref 0.00–0.09)

## 2011-03-04 LAB — D-DIMER, QUANTITATIVE: D-Dimer, Quant: 0.51 ug/mL-FEU — ABNORMAL HIGH (ref 0.00–0.48)

## 2011-05-06 DIAGNOSIS — IMO0002 Reserved for concepts with insufficient information to code with codable children: Secondary | ICD-10-CM | POA: Insufficient documentation

## 2011-05-06 NOTE — Assessment & Plan Note (Signed)
[facility]                            CARDIOLOGY OFFICE NOTE   AYN, DOMANGUE                       MRN:          [id number]  DATE:06/22/2008                            DOB:          08/13/1976    CLINICAL HISTORY:  Krista Fisher is a very pleasant 35 year old woman with a  history of morbid obesity, hypertension, palpitations, and chest pain  who returns for followup.   She was previously patient care representative here with [facility] but is  now going to a nursing assist school.  She is also working part-time as  a Teacher, adult education.  Unfortunately, she is having difficulty providing  massages as she often feels quite dizzy, and it is hard for her to stand  on her feet for a long time.  She denies any further palpitations.  She  does get some occasional chest pain but this is associated with burping  and reflux.   MEDICATIONS:  1. Cardizem CD 120 a day.  2. Hydrochlorothiazide 25 a day.  3. Omeprazole 20 a day.   PHYSICAL EXAMINATION:  GENERAL:  She is in no acute distress.  Ambulatory around the clinic without any respiratory difficulty.  VITAL SIGNS:  Blood pressure is 122/84, heart rate is 106, weight is 364  pounds, which is up 28 pounds from 2 years ago.  HEENT:  Normal.  NECK:  Supple and thick.  I am unable to evaluate JVD.  Carotids are 2+  bilaterally.  No bruits.  There is no lymphadenopathy or thyromegaly.  CARDIAC:  PMI is nonpalpable.  She is tachycardiac and regular.  No  murmurs.  LUNGS:  Clear.  ABDOMEN:  Obese, nontender, and nondistended.  Unable to evaluate for  any hepatosplenomegaly, bruits, or masses.  EXTREMITIES:  Warm with no  cyanosis or clubbing.  There is trace to 1+ edema bilaterally which is  chronic.  NEUROLOGIC:  Alert and oriented x3.  Cranial nerves II through XII are  intact.  Moves all 4 extremities without difficulty.  Affect is  pleasant.   EKG shows sinus tachycardia at a rate of 106.  There was a  first-degree  AV block.  PR interval was 202 msec.   ASSESSMENT AND PLAN:  1. Obesity.  This is clearly limiting Kimberleigh's functional capacity.      She is now having problems performing a job as a Teacher, adult education      and is asking whether or not she may do better with clerical      position.  I felt that this was very reasonable.  She always      understands the need to lose weight, and I have encouraged her to      continue with this.  2. Hypertension.  Blood pressure is well controlled.  3. Lower extremity edema.  This is chronic.  She is on a diuretic.      She likely has significant venous insufficiency.  4. Chest pain.  This is likely reflux disease.  Continue proton pump      inhibitor.   She will continue current therapy and follow  up with her primary care  doctor.  She can follow up on a p.r.n. basis.     Bevelyn Buckles. Bensimhon, MD  Electronically Signed    DRB/MedQ  DD: 06/22/2008  DT: 06/23/2008  Job #: [id number]

## 2011-05-09 NOTE — Op Note (Signed)
[facility]  Patient:    Krista Fisher, Krista Fisher                       MRN: [id number] Proc. Date: 07/31/00 Adm. Date:  [id number] Disc. Date: [id number] Attending:  Maxie Better                           Operative Report  PREOPERATIVE DIAGNOSIS:       Pelvic pain, metrorrhagia.  PROCEDURE:                    Diagnostic hysteroscopy, removal of endometrial polyp, dilation and curettage, diagnostic laparoscopy, repair of cervical laceration.  POSTOPERATIVE DIAGNOSIS:      Pelvic pain, metrorrhagia, endometrial polyp, question septated ______ versus intrauterine adhesions.  ANESTHESIA:                   General endotracheal.  SURGEON:                      Sheronette A. Cherly Hensen, M.D.  INDICATIONS:                  This is a 35 year old gravida 1, para 1 female with persistent right-sided pain and vaginal bleeding on Depo-Provera who was found on ultrasound to have polypoid lesion and who now presents for evaluation of above-noted problems.  Risks and benefits of each procedure have been explained to the patient at length.  Consent was signed.  The patient was transferred to the operating room.  DESCRIPTION OF PROCEDURE:     Under adequate general endotracheal, the patient was placed in the dorsal lithotomy position.  Examination under anesthesia revealed an anteverted uterus.  No adnexal masses palpable.  The patient was sterilely prepped and draped in the usual fashion.  The bladder was catheterized for a moderate amount of urine.                                Speculum was placed in the vagina.  Single-tooth tenaculum was placed in the anterior lip of the cervix.  The cervical os was then serially dilated up to #27 [facility] dilator.  While using the #29 Pratt dilator and retraction, the single-tooth tenaculum came off the anterior lip of the cervix resulting in a laceration.  The tenaculum was then repositioned, and dilation was then carried further.   A sorbitol primed diagnostic hysteroscope was introduced into the uterine cavity.  Of note, there was no endocervical canal lesion.   The fundal area was notable for two areas of marked opening with a secondary in between and was asymmetrical.  In the right horn area of the uterus was a polypoid lesion, and on the left that cavity area could not be traversed.  The hysteroscope was then removed, and curetting was then performed.  The hysteroscope was then reinserted into the uterine cavity and reinspected the polypoid lesion and had been removed.  The area of thickening longitudinally off midline was still present.  The cavity was then curetted once more after the hysteroscope was then removed.  The cervical laceration was repaired with 3-0 chromic suture and using a single apparatus for manipulation of the uterus, one that was placed in the cervix, and the anterior cervical area was then utilized.  Bivalve speculum was removed and sterile technique was then reestablished.  Attention was then turned to the abdomen where a small infraumbilical incision was made.  Veress needle was introduced into the abdomen.  Opening pressure of 5 was noted.  Three and a half liters of CO2 was insufflated.  Veress needle was removed.  A 10-11 mm disposable trocar was introduced into the peritoneal cavity without incident. Through that port, a lighted video laparoscope was introduced into the abdominal cavity.  A suprapubic incision was then made.  A 5 mm port was then used to lyse through that site, and probe was then utilized to inspect the pelvic content.  Panoramic view was notable for the liver edge to be normal, gallbladder appearing normal.  There was a very elongated but an otherwise normal appendix.  Both tubes and ovaries were noted to be normal.  The posterior cul-de-sac was without any endometriotic implant.  The anterior cul-de-sac was noted to have some areas of tenting where the prior  cesarean section scar has been performed.  The pelvis was again reinspected.  No cause for her pelvic pain was noted.  The decision was made to remove the suprapubic site, check its point of entry, and no bleeding was noted.  Abdomen was further deflated and with lighted guidance, the infraumbilical port was also removed.  The skin incisions were injected with quarter-percent Marcaine.  The infraumbilical site was closed with a deep layer of 0 Vicryl figure-of-eight suture, and the skin was approximated with 4-0 Vicryl suture both infraumbilically and suprapubically.  Attention was then turned back to the vagina where all instruments were removed.  Bivalve speculum was then used to visualize the cervix and assured no additional area of concern.  Specimen labeled endometrial curetting with endometrial polyp was sent to pathology.  ESTIMATED BLOOD LOSS:         Minimal.  COMPLICATIONS:                Cervical laceration.                                The patient tolerated the procedure well and was transferred to the recovery room in stable condition. DD:  07/31/00 TD:  08/02/00 Job: [id number]

## 2011-05-09 NOTE — Assessment & Plan Note (Signed)
[facility]                              CARDIOLOGY OFFICE NOTE   Consepcion Hearing DENETTA FEI                       MRN:          [id number]  DATE:10/08/2006                            DOB:          1976-11-20    PATIENT IDENTIFICATION:  Ms. 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   PROBLEM LIST:  1. Hypertension.  2. Chest pain and palpitations.      a.     Holter monitor showed a sinus rhythm with rare premature       ventricular contractions and premature atrial contractions, and no       significant arrhythmias.      b.     Symptoms resolved on low-dose Lopressor.      c.     Adenosine Myoview October 2007 ejection fraction of 61%, normal       perfusion.  3. Obesity.   CURRENT MEDICATIONS:  1. Hydrochlorothiazide 12.5 mg a day.  2. Lopressor 12.5 mg b.i.d.  3. Protonix 40 mg a day.   INTERVAL HISTORY:  Kassity returns today for routine followup.  She says with  the initiation of Lopressor and Protonix, her chest pain and palpitations  have resolved.  She is feeling well.  Blood pressure is also improved on the  hydrochlorothiazide; however, she has had some swelling.  There has been no  orthopnea or PND.  She has not been totally compliant with keeping her feet  up or salt restriction.   PHYSICAL EXAMINATION:  She is well-appearing, in no acute distress,  ambulatory in the clinic without any difficulty.  Respirations are  unlabored.  Blood pressure is 114/70, heart rate is 78, her weight is 336.  HEENT:  Sclerae are anicteric, EOMI.  There is no xanthelasma.  Mucous  membranes are moist.  NECK:  Supple.  There is no JVD.  CARDIAC:  She has a regular rate and rhythm, no murmurs, rubs or gallops.  LUNGS:  Clear.  ABDOMEN:  Obese, nontender, nondistended.  Good bowel sounds.  EXTREMITIES:  Warm with no cyanosis or clubbing.  There is trace to 1+ edema  bilaterally.  NEUROLOGIC:  She is alert and oriented x3.  Cranial nerves II-XII are  intact.  She moves all 4 extremities without difficulty.  Affect is very  pleasant.   ASSESSMENT AND PLAN:  1. Chest pain and palpitations.  Resolved with the addition of beta      blocker and proton pump inhibitor.  Adenosine Myoview is negative.      Continue current therapy.  2. Hypertension.  Much improved and well controlled now on      hydrochlorothiazide.  However, given her lower extremity edema, will      increase to 25 mg a day and also add 20 mEq of potassium.  3. Lower extremity edema.  Likely due to venous insufficiency.  I have      reminded her of the need for salt restriction as well as the need to  keep her legs elevated.  We are increasing her hydrochlorothiazide a      bit today.   DISPOSITION:  She will return to clinic on a p.r.n. basis.       Bevelyn Buckles. Bensimhon, MD     DRB/MedQ  DD:  10/08/2006  DT:  10/11/2006  Job #:  [id number]

## 2011-05-09 NOTE — Assessment & Plan Note (Signed)
[facility]                              CARDIOLOGY OFFICE NOTE   Krista Fisher, Krista Fisher                       MRN:          [id number]  DATE:09/17/2006                            DOB:          09/27/1976    REASON FOR VISIT:  Chest pain and palpitations.   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   HISTORY OF PRESENT ILLNESS:  Krista Fisher is a 35 year old woman who works here in  our office. She has a history of hypertension for 1-1/2 years as well as  obesity.  She tells me that about a year and a half ago she had some chest  pain and palpitations.  She was seen by [facility] in [facility].  At that time, she had a monitor which was reportedly negative.  She  did not have any further testing.  Since that time, she continues to have  chest discomfort.  She said it can come and go.  It has been especially  worse over the past 2 weeks.  It mostly occurs at night when she is laying  down.  It is also associated with palpitation.  She feels her heart pauses  and then goes fast for a few minutes.  She denies any significant exertional  chest pain though she does get dyspneic with significant activity.  She does  have a history of gastroesophageal reflux but denies any symptoms currently.   REVIEW OF SYSTEMS:  Review of systems is negative for HPI and problem list.   PROBLEM LIST:  1. Obesity.  2. Hypertension.  3. Chest pain and palpitations.      a.     Negative Holter monitor approximately a year and a half ago.   CURRENT MEDICATIONS:  Hydrochlorothiazide 12.5 mg daily.   SOCIAL HISTORY:  She denies any alcohol or tobacco use.  She does have a 7-  year-old child.  She works here at [employer].   FAMILY HISTORY:  Father is 66 years old.  He has a history of coronary  artery  disease and MI at 45.  Mother is 63.  A history of thyroid disease.  She has one sister who has a history of multiple sclerosis.   PHYSICAL EXAMINATION:  GENERAL:  An obese woman in no acute distress.  She  ambulates around the clinic without any difficulty.  VITAL SIGNS:  Blood pressure is 112/64.  Heart rate is 80.  HEENT:  Sclerae are anicteric.  EOMI.  There is no xanthelasma.  Mucous  membranes are moist.  NECK:  Supple  There is no JVD.  Carotids are 2+ bilaterally without bruits.  There is no lymphadenopathy or thyromegaly.  CARDIAC:  Regular rate and rhythm.  No murmurs, rubs, or gallops.  LUNGS:  Clear.  ABDOMEN:  Obese, nontender, nondistended.  No hepatosplenomegaly.  No  bruits.  No masses.  Good bowel sounds.  EXTREMITIES:  Warm with no cyanosis,  clubbing, or edema.  NEURO:  She is alert and oriented x3.  Cranial nerves II-XII are intact.  Moves all four extremities without difficulty.   EKG shows normal sinus rhythm with first-degree AV block of 292 ms.  It  shows normal QRS duration.  Nonspecific T-wave flattening.   ASSESSMENT/PLAN:  1. Chest pain and palpitations.  Given her age, I suspect this is not      ischemia.  However, she does have some risk factors and we will proceed      with a treadmill Myoview to rule out any occult ischemia.  We will also      check an echocardiogram to rule out any underlying structural      abnormalities.  I suspect that she may have a component of      gastroesophageal reflux disease.  We have also started her on Protonix.  2. Palpitations.  We will put a 48-hour Holter monitor on her, check an      echocardiogram, and start Lopressor 12.5 mg twice daily.  She will also      need a thyroid panel eventually.  3. Hypertension, well controlled.   DISPOSITION:  We will await the results of her tests and see her back in  several weeks for routine followup.       Bevelyn Buckles. Bensimhon, MD     DRB/MedQ  DD:  09/17/2006  DT:   09/19/2006  Job #:  [id number]

## 2011-05-30 ENCOUNTER — Encounter (HOSPITAL_BASED_OUTPATIENT_CLINIC_OR_DEPARTMENT_OTHER)
Admission: RE | Admit: 2011-05-30 | Discharge: 2011-05-30 | Disposition: A | Discharge status: Home / Self Care | Payer: Managed Care, Other (non HMO) | Source: Ambulatory Visit | Attending: Specialist | Admitting: Specialist

## 2011-05-30 LAB — DIFFERENTIAL
Basophils Absolute: 0 10*3/uL (ref 0.0–0.1)
Basophils Relative: 0 % (ref 0–1)
Eosinophils Absolute: 0.2 10*3/uL (ref 0.0–0.7)
Eosinophils Relative: 3 % (ref 0–5)
Lymphocytes Relative: 20 % (ref 12–46)
Lymphs Abs: 1.5 10*3/uL (ref 0.7–4.0)
Monocytes Absolute: 0.9 10*3/uL (ref 0.1–1.0)
Monocytes Relative: 12 % (ref 3–12)
Neutro Abs: 4.8 10*3/uL (ref 1.7–7.7)
Neutrophils Relative %: 65 % (ref 43–77)

## 2011-05-30 LAB — BASIC METABOLIC PANEL
BUN: 9 mg/dL (ref 6–23)
CO2: 24 mEq/L (ref 19–32)
Calcium: 9.3 mg/dL (ref 8.4–10.5)
Chloride: 105 mEq/L (ref 96–112)
Creatinine, Ser: 0.84 mg/dL (ref 0.4–1.2)
GFR calc Af Amer: >60 mL/min (ref >60)
GFR calc non Af Amer: >60 mL/min (ref >60)
Glucose, Bld: 80 mg/dL (ref 70–99)
Potassium: 4.3 mEq/L (ref 3.5–5.1)
Sodium: 136 mEq/L (ref 135–145)

## 2011-05-30 LAB — CBC
HCT: 35.7 % — ABNORMAL LOW (ref 36.0–46.0)
Hemoglobin: 13.2 g/dL (ref 12.0–15.0)
MCH: 26.8 pg (ref 26.0–34.0)
MCHC: 37 g/dL — ABNORMAL HIGH (ref 30.0–36.0)
MCV: 72.6 fL — ABNORMAL LOW (ref 78.0–100.0)
Platelets: 217 10*3/uL (ref 150–400)
RBC: 4.92 MIL/uL (ref 3.87–5.11)
RDW: 14.2 % (ref 11.5–15.5)
WBC: 7.4 10*3/uL (ref 4.0–10.5)

## 2011-06-02 ENCOUNTER — Ambulatory Visit (HOSPITAL_BASED_OUTPATIENT_CLINIC_OR_DEPARTMENT_OTHER)
Admission: RE | Admit: 2011-06-02 | Discharge: 2011-06-02 | Disposition: A | Discharge status: Home / Self Care | Payer: Managed Care, Other (non HMO) | Source: Ambulatory Visit | Attending: Specialist | Admitting: Specialist

## 2011-06-02 ENCOUNTER — Other Ambulatory Visit: Payer: Self-pay | Admitting: Specialist

## 2011-06-02 DIAGNOSIS — K219 Gastro-esophageal reflux disease without esophagitis: Secondary | ICD-10-CM | POA: Insufficient documentation

## 2011-06-02 DIAGNOSIS — L905 Scar conditions and fibrosis of skin: Secondary | ICD-10-CM | POA: Insufficient documentation

## 2011-06-02 DIAGNOSIS — Z01812 Encounter for preprocedural laboratory examination: Secondary | ICD-10-CM | POA: Insufficient documentation

## 2011-06-02 DIAGNOSIS — E669 Obesity, unspecified: Secondary | ICD-10-CM | POA: Insufficient documentation

## 2011-06-02 DIAGNOSIS — G4733 Obstructive sleep apnea (adult) (pediatric): Secondary | ICD-10-CM | POA: Insufficient documentation

## 2011-06-02 DIAGNOSIS — J45909 Unspecified asthma, uncomplicated: Secondary | ICD-10-CM | POA: Insufficient documentation

## 2011-10-27 NOTE — Op Note (Signed)
  NAMEMarland Kitchen  Krista Fisher, Krista Fisher                ACCOUNT NO.:  [id number]  MEDICAL RECORD NO.:  [id number]  LOCATION:                                 FACILITY:  PHYSICIAN:  Earvin Hansen L. Raygan Skarda, M.D.DATE OF BIRTH:  06-30-1976  DATE OF PROCEDURE:  06/02/2011 DATE OF DISCHARGE:                              OPERATIVE REPORT   This is a 35 year old lady who was injured, accidentally fell, sustained a fracture to her left wrist area necessitating surgery, open reduction and internal fixation resulted in a severe hypertrophic scar involving the left wrist pain, discomfort, irritation, itching and burning.  Tried conservative treatment including massage, steroid medication, injections with no avail.  PROCEDURES PLANNED:  Wide excision of severe scar cicatrix of the left wrist volar surface of flap closure.  ANESTHESIA:  General.  The patient underwent general anesthesia, intubated orally.  Tourniquet was placed on the left wrist on the left upper extremity.  After had been intubated, prep was done to the left hand arm areas with Hibiclens soap and solution walled off with sterile towels and drapes so as to make a sterile field.  An X mark was placed and tourniquet was inflated up 300 mmHg.  Outlines of the scar was done with a marking pen and the wound was scored with #15 blade.  Most of the tissue was removed with 15 blade down the deep subcutaneous tissue.  After this proper hemostasis exposed, vessels were coagulated with a Bovie.  Then the flaps were freed out approximately 4 cm at each side to allow advancement without increased tension using 2-0 Monocryl deep to the subcutaneous and fascial areas.  Subcutaneous suture of 2-0 Monocryl, subdermal suture of 3-0 Monocryl and then a running subcuticular stitch of 5-0 Monocryl. Half-inch Steri-Strips were applied.  Silicone gel patches, proper hand arm dressings.  She tolerated all the procedures very well.  Tourniquet was released and then she  was taken to the recovery in excellent condition.  Estimated blood loss nil.  Tourniquet used.     Yaakov Guthrie. Shon Hough, M.D.     Cathie Hoops  D:  06/02/2011  T:  06/03/2011  Job:  [id number]  Electronically Signed by Louisa Second M.D. on 10/27/2011 07:08:18 PM

## 2012-02-05 ENCOUNTER — Other Ambulatory Visit: Payer: Self-pay | Admitting: Nurse Practitioner

## 2012-02-05 ENCOUNTER — Other Ambulatory Visit (HOSPITAL_COMMUNITY)
Admission: RE | Admit: 2012-02-05 | Discharge: 2012-02-05 | Disposition: A | Discharge status: Home / Self Care | Payer: Commercial Managed Care - PPO | Source: Ambulatory Visit | Attending: Obstetrics and Gynecology | Admitting: Obstetrics and Gynecology

## 2012-02-05 DIAGNOSIS — Z01419 Encounter for gynecological examination (general) (routine) without abnormal findings: Secondary | ICD-10-CM | POA: Insufficient documentation

## 2012-02-05 DIAGNOSIS — N760 Acute vaginitis: Secondary | ICD-10-CM | POA: Insufficient documentation

## 2012-02-05 DIAGNOSIS — Z1159 Encounter for screening for other viral diseases: Secondary | ICD-10-CM | POA: Insufficient documentation

## 2013-05-28 DIAGNOSIS — M25569 Pain in unspecified knee: Secondary | ICD-10-CM | POA: Insufficient documentation

## 2013-11-03 DIAGNOSIS — D251 Intramural leiomyoma of uterus: Secondary | ICD-10-CM | POA: Insufficient documentation

## 2013-11-03 DIAGNOSIS — N920 Excessive and frequent menstruation with regular cycle: Secondary | ICD-10-CM | POA: Insufficient documentation

## 2014-04-11 DIAGNOSIS — F411 Generalized anxiety disorder: Secondary | ICD-10-CM | POA: Insufficient documentation

## 2014-07-07 ENCOUNTER — Emergency Department (HOSPITAL_BASED_OUTPATIENT_CLINIC_OR_DEPARTMENT_OTHER)
Admission: EM | Admit: 2014-07-07 | Discharge: 2014-07-07 | Disposition: A | Discharge status: Home / Self Care | Payer: Medicaid Other | Attending: Emergency Medicine | Admitting: Emergency Medicine

## 2014-07-07 ENCOUNTER — Emergency Department (HOSPITAL_BASED_OUTPATIENT_CLINIC_OR_DEPARTMENT_OTHER): Payer: Medicaid Other

## 2014-07-07 ENCOUNTER — Encounter (HOSPITAL_BASED_OUTPATIENT_CLINIC_OR_DEPARTMENT_OTHER): Payer: Self-pay | Admitting: Emergency Medicine

## 2014-07-07 DIAGNOSIS — R079 Chest pain, unspecified: Secondary | ICD-10-CM | POA: Insufficient documentation

## 2014-07-07 DIAGNOSIS — R42 Dizziness and giddiness: Secondary | ICD-10-CM | POA: Insufficient documentation

## 2014-07-07 DIAGNOSIS — Z79899 Other long term (current) drug therapy: Secondary | ICD-10-CM | POA: Diagnosis not present

## 2014-07-07 DIAGNOSIS — Z91040 Latex allergy status: Secondary | ICD-10-CM | POA: Diagnosis not present

## 2014-07-07 DIAGNOSIS — R51 Headache: Secondary | ICD-10-CM | POA: Diagnosis not present

## 2014-07-07 DIAGNOSIS — Z903 Acquired absence of stomach [part of]: Secondary | ICD-10-CM | POA: Insufficient documentation

## 2014-07-07 DIAGNOSIS — R002 Palpitations: Secondary | ICD-10-CM

## 2014-07-07 DIAGNOSIS — Z9089 Acquired absence of other organs: Secondary | ICD-10-CM | POA: Insufficient documentation

## 2014-07-07 DIAGNOSIS — K219 Gastro-esophageal reflux disease without esophagitis: Secondary | ICD-10-CM

## 2014-07-07 DIAGNOSIS — K2100 Gastro-esophageal reflux disease with esophagitis, without bleeding: Secondary | ICD-10-CM | POA: Diagnosis not present

## 2014-07-07 DIAGNOSIS — M7989 Other specified soft tissue disorders: Secondary | ICD-10-CM | POA: Insufficient documentation

## 2014-07-07 DIAGNOSIS — R000 Tachycardia, unspecified: Secondary | ICD-10-CM | POA: Diagnosis not present

## 2014-07-07 DIAGNOSIS — R519 Headache, unspecified: Secondary | ICD-10-CM

## 2014-07-07 HISTORY — DX: Gastro-esophageal reflux disease without esophagitis: K21.9

## 2014-07-07 LAB — COMPREHENSIVE METABOLIC PANEL
ALT: 15 U/L (ref 0–35)
AST: 24 U/L (ref 0–37)
Albumin: 3.6 g/dL (ref 3.5–5.2)
Alkaline Phosphatase: 96 U/L (ref 39–117)
Anion gap: 13 (ref 5–15)
BUN: 14 mg/dL (ref 6–23)
CO2: 24 mEq/L (ref 19–32)
Calcium: 9.8 mg/dL (ref 8.4–10.5)
Chloride: 104 mEq/L (ref 96–112)
Creatinine, Ser: 1.1 mg/dL (ref 0.50–1.10)
GFR calc Af Amer: 73 mL/min — ABNORMAL LOW (ref >90)
GFR calc non Af Amer: 63 mL/min — ABNORMAL LOW (ref >90)
Glucose, Bld: 109 mg/dL — ABNORMAL HIGH (ref 70–99)
Potassium: 3.6 mEq/L — ABNORMAL LOW (ref 3.7–5.3)
Sodium: 141 mEq/L (ref 137–147)
Total Bilirubin: 0.3 mg/dL (ref 0.3–1.2)
Total Protein: 7.9 g/dL (ref 6.0–8.3)

## 2014-07-07 LAB — URINALYSIS, ROUTINE W REFLEX MICROSCOPIC
Bilirubin Urine: NEGATIVE
Glucose, UA: NEGATIVE mg/dL
Hgb urine dipstick: NEGATIVE
Ketones, ur: NEGATIVE mg/dL
Leukocytes, UA: NEGATIVE
Nitrite: NEGATIVE
Protein, ur: NEGATIVE mg/dL
Specific Gravity, Urine: 1.029 (ref 1.005–1.030)
Urobilinogen, UA: 1 mg/dL (ref 0.0–1.0)
pH: 5 (ref 5.0–8.0)

## 2014-07-07 LAB — TROPONIN I: Troponin I: <0.3 ng/mL (ref <0.30)

## 2014-07-07 LAB — CBC WITH DIFFERENTIAL/PLATELET
Basophils Absolute: 0 10*3/uL (ref 0.0–0.1)
Basophils Relative: 0 % (ref 0–1)
Eosinophils Absolute: 0.2 10*3/uL (ref 0.0–0.7)
Eosinophils Relative: 2 % (ref 0–5)
HCT: 37.4 % (ref 36.0–46.0)
Hemoglobin: 13.3 g/dL (ref 12.0–15.0)
Lymphocytes Relative: 21 % (ref 12–46)
Lymphs Abs: 2.2 10*3/uL (ref 0.7–4.0)
MCH: 27.1 pg (ref 26.0–34.0)
MCHC: 35.6 g/dL (ref 30.0–36.0)
MCV: 76.2 fL — ABNORMAL LOW (ref 78.0–100.0)
Monocytes Absolute: 0.9 10*3/uL (ref 0.1–1.0)
Monocytes Relative: 9 % (ref 3–12)
Neutro Abs: 7.1 10*3/uL (ref 1.7–7.7)
Neutrophils Relative %: 68 % (ref 43–77)
Platelets: 250 10*3/uL (ref 150–400)
RBC: 4.91 MIL/uL (ref 3.87–5.11)
RDW: 13.5 % (ref 11.5–15.5)
WBC: 10.4 10*3/uL (ref 4.0–10.5)

## 2014-07-07 LAB — LIPASE, BLOOD: Lipase: 28 U/L (ref 11–59)

## 2014-07-07 LAB — PRO B NATRIURETIC PEPTIDE: Pro B Natriuretic peptide (BNP): 32.3 pg/mL (ref 0–125)

## 2014-07-07 IMAGING — CT CT HEAD W/O CM
1 series · 16 of 30 positions shown, 20 images · non-contrast
Comparison: None.

CLINICAL DATA: Headache, dizziness.

EXAM:
CT HEAD WITHOUT CONTRAST
TECHNIQUE: Contiguous axial images were obtained from the base of the skull
through the vertex without intravenous contrast.

[Series 2: head 4.8 h37s · axial · 0.48mm/px · z∈[-95,+57]mm · 16 of 36 slices shown, 20 images]
[im 2/36  brain]
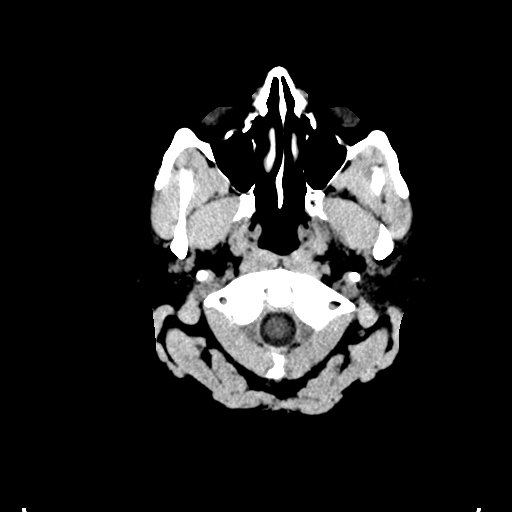
[im 2/36  bone]
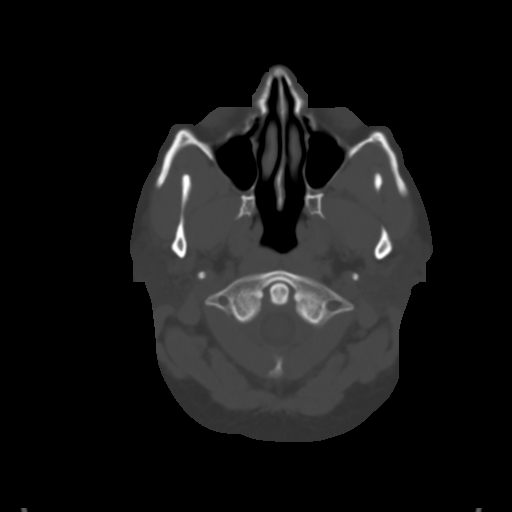
[im 4/36  brain]
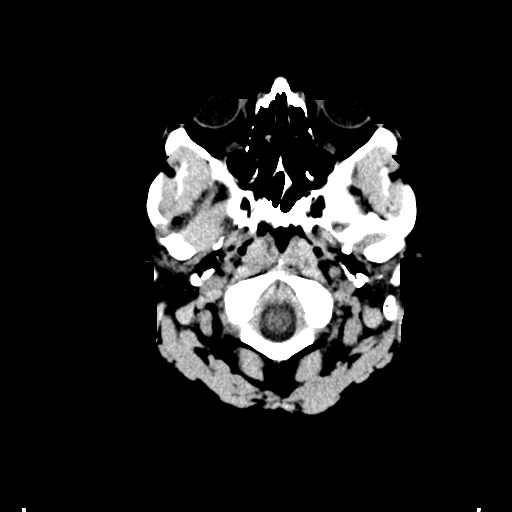
[im 7/36  brain]
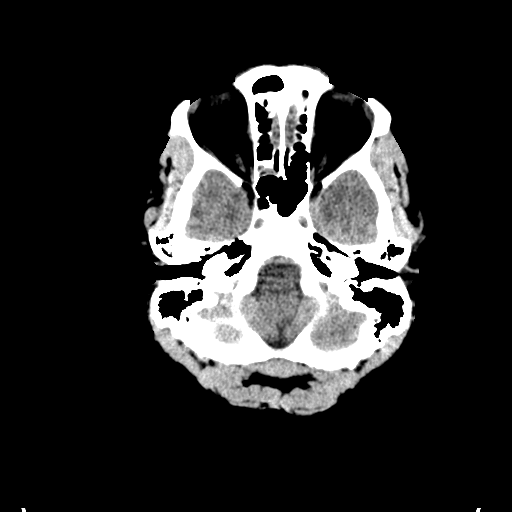
[im 9/36  brain]
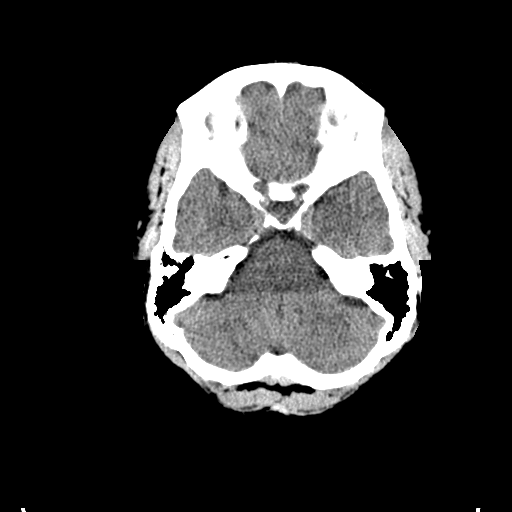
[im 10/36  brain]
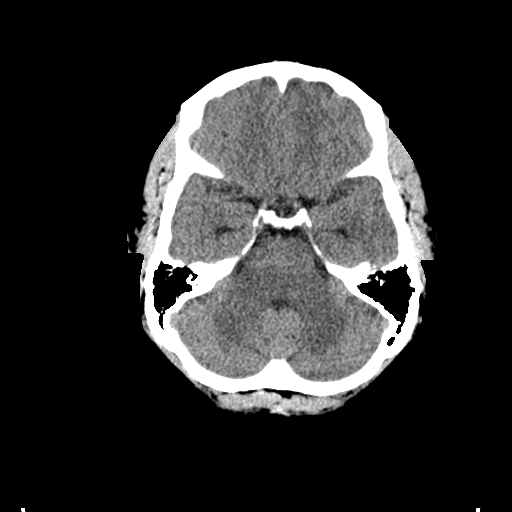
[im 10/36  bone]
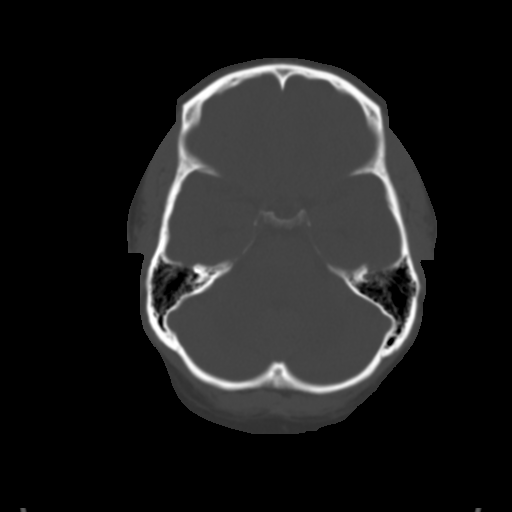
[im 13/36  brain]
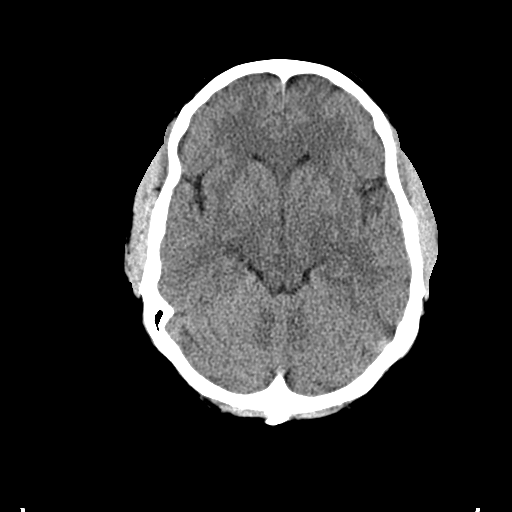
[im 15/36  brain]
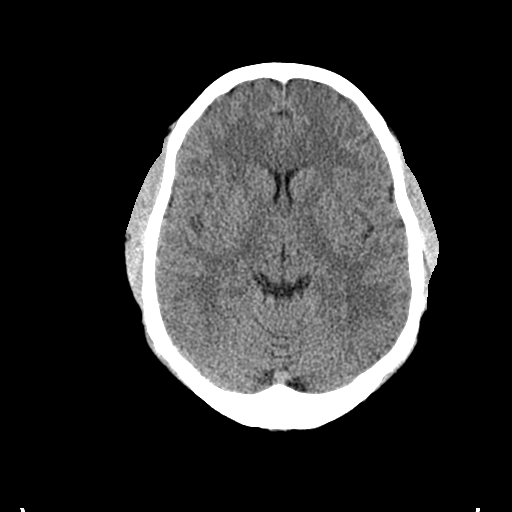
[im 17/36  brain]
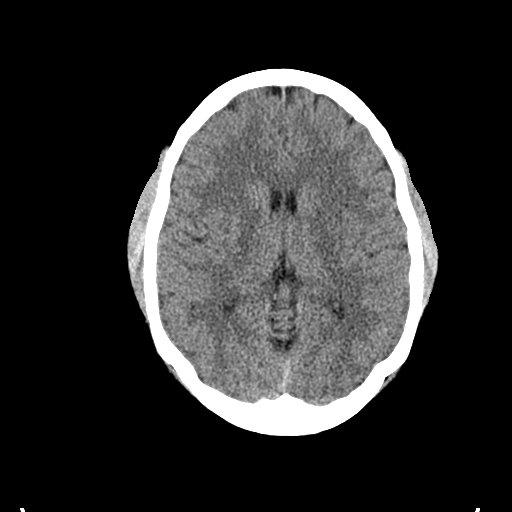
[im 19/36  brain]
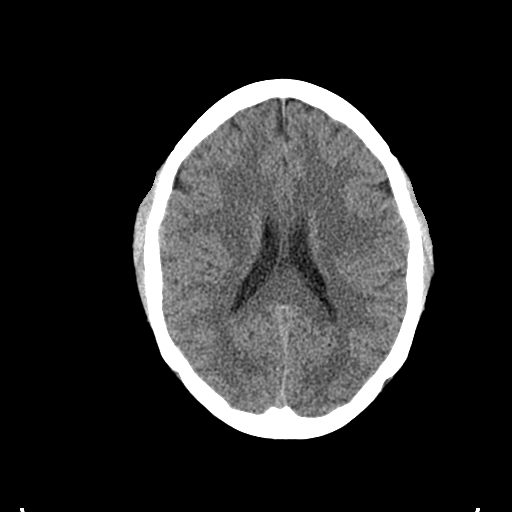
[im 19/36  bone]
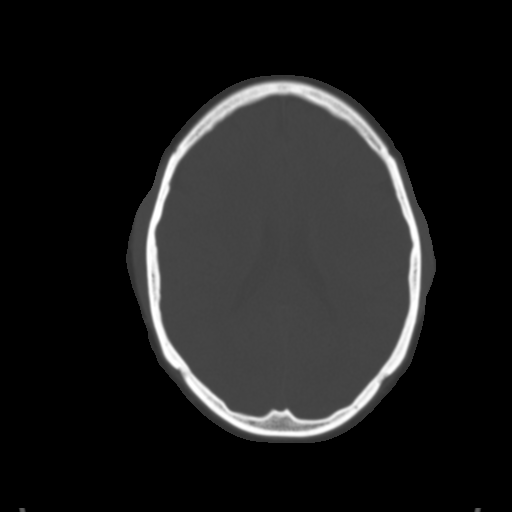
[im 21/36  brain]
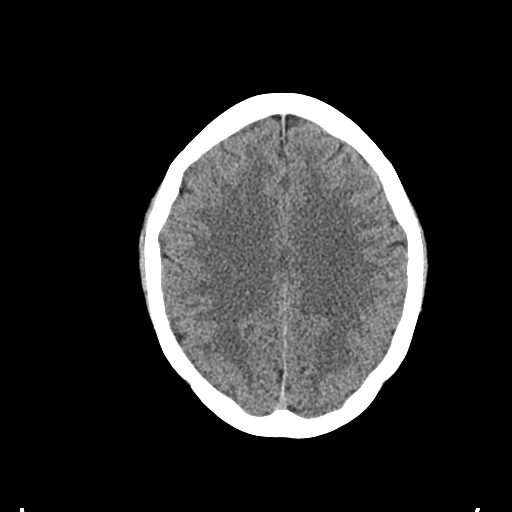
[im 23/36  brain]
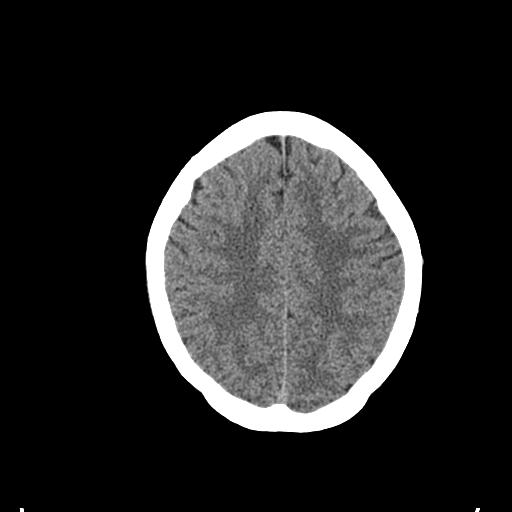
[im 26/36  brain]
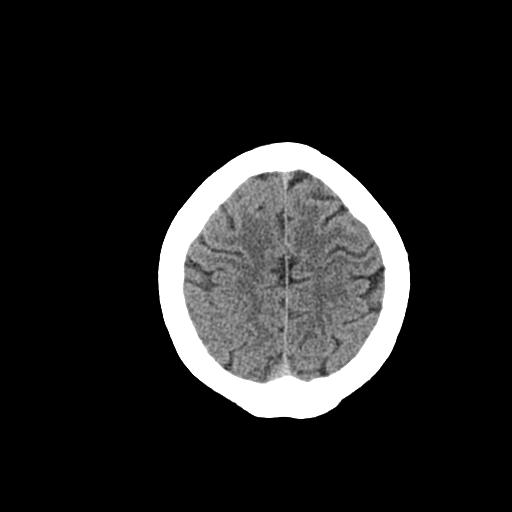
[im 27/36  brain]
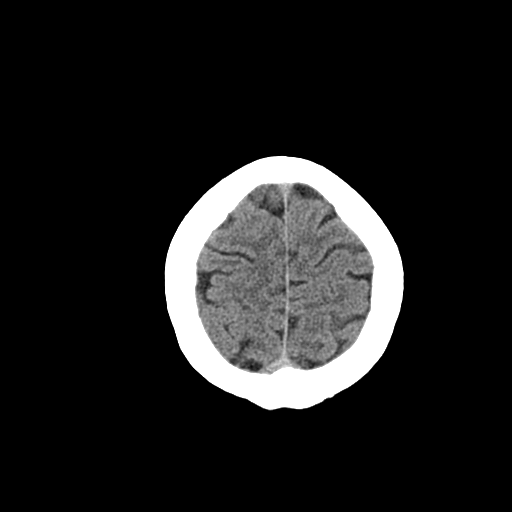
[im 27/36  bone]
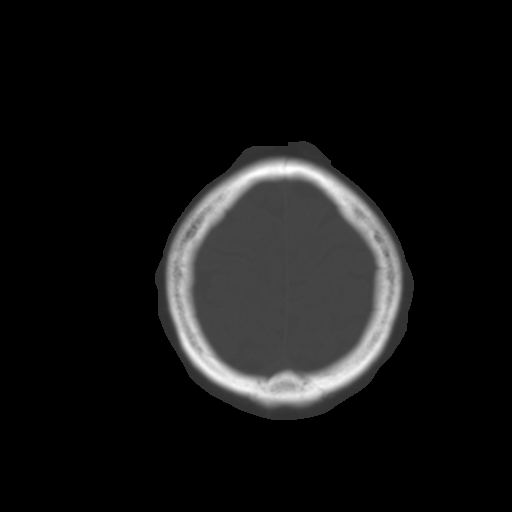
[im 29/36  brain]
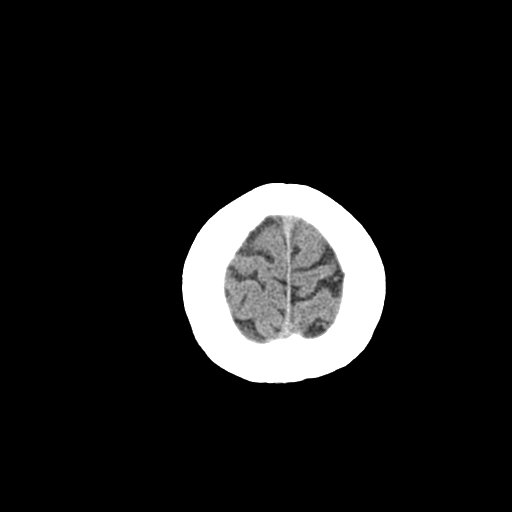
[im 32/36  brain]
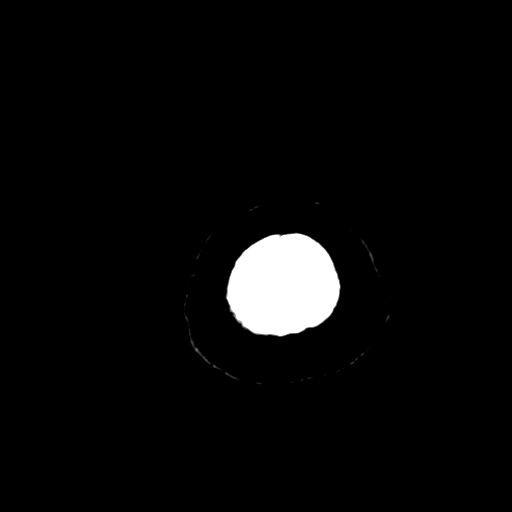
[im 34/36  brain]
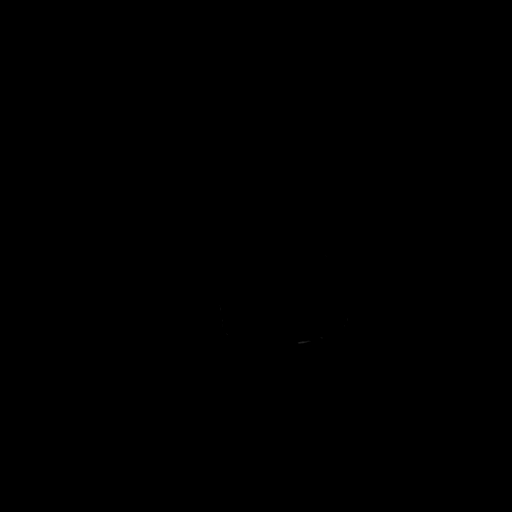

[16 of 30 positions shown; findings below may reference images not displayed]

FINDINGS: Bony calvarium appears intact. No mass effect or midline shift is
noted. Ventricular size is within normal limits. There is no
evidence of mass lesion, hemorrhage or acute infarction.
IMPRESSION: Normal head CT.

## 2014-07-07 IMAGING — CR DG CHEST 2V
2 series · 2 of 2 positions shown · non-contrast
Comparison: [DATE]

CLINICAL DATA: Chest pain.

EXAM:
CHEST  2 VIEW

[w chest pa]
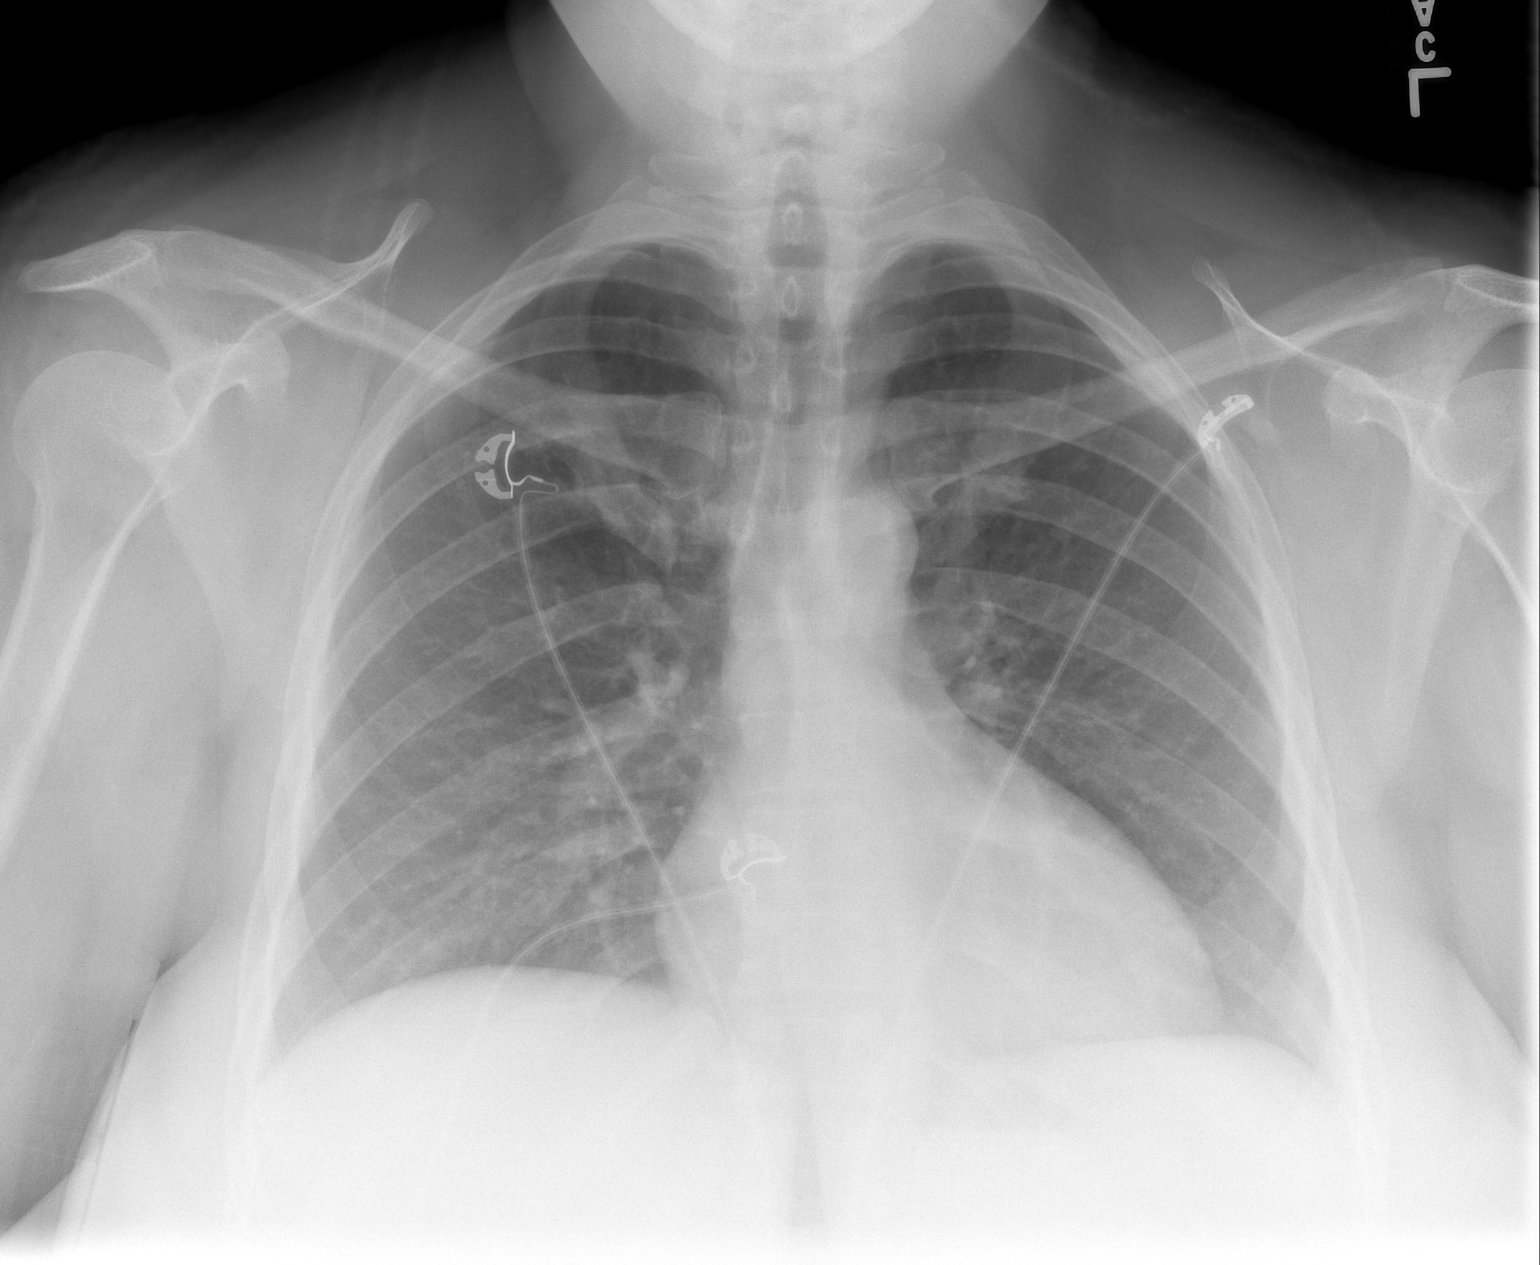

[w chest lat]
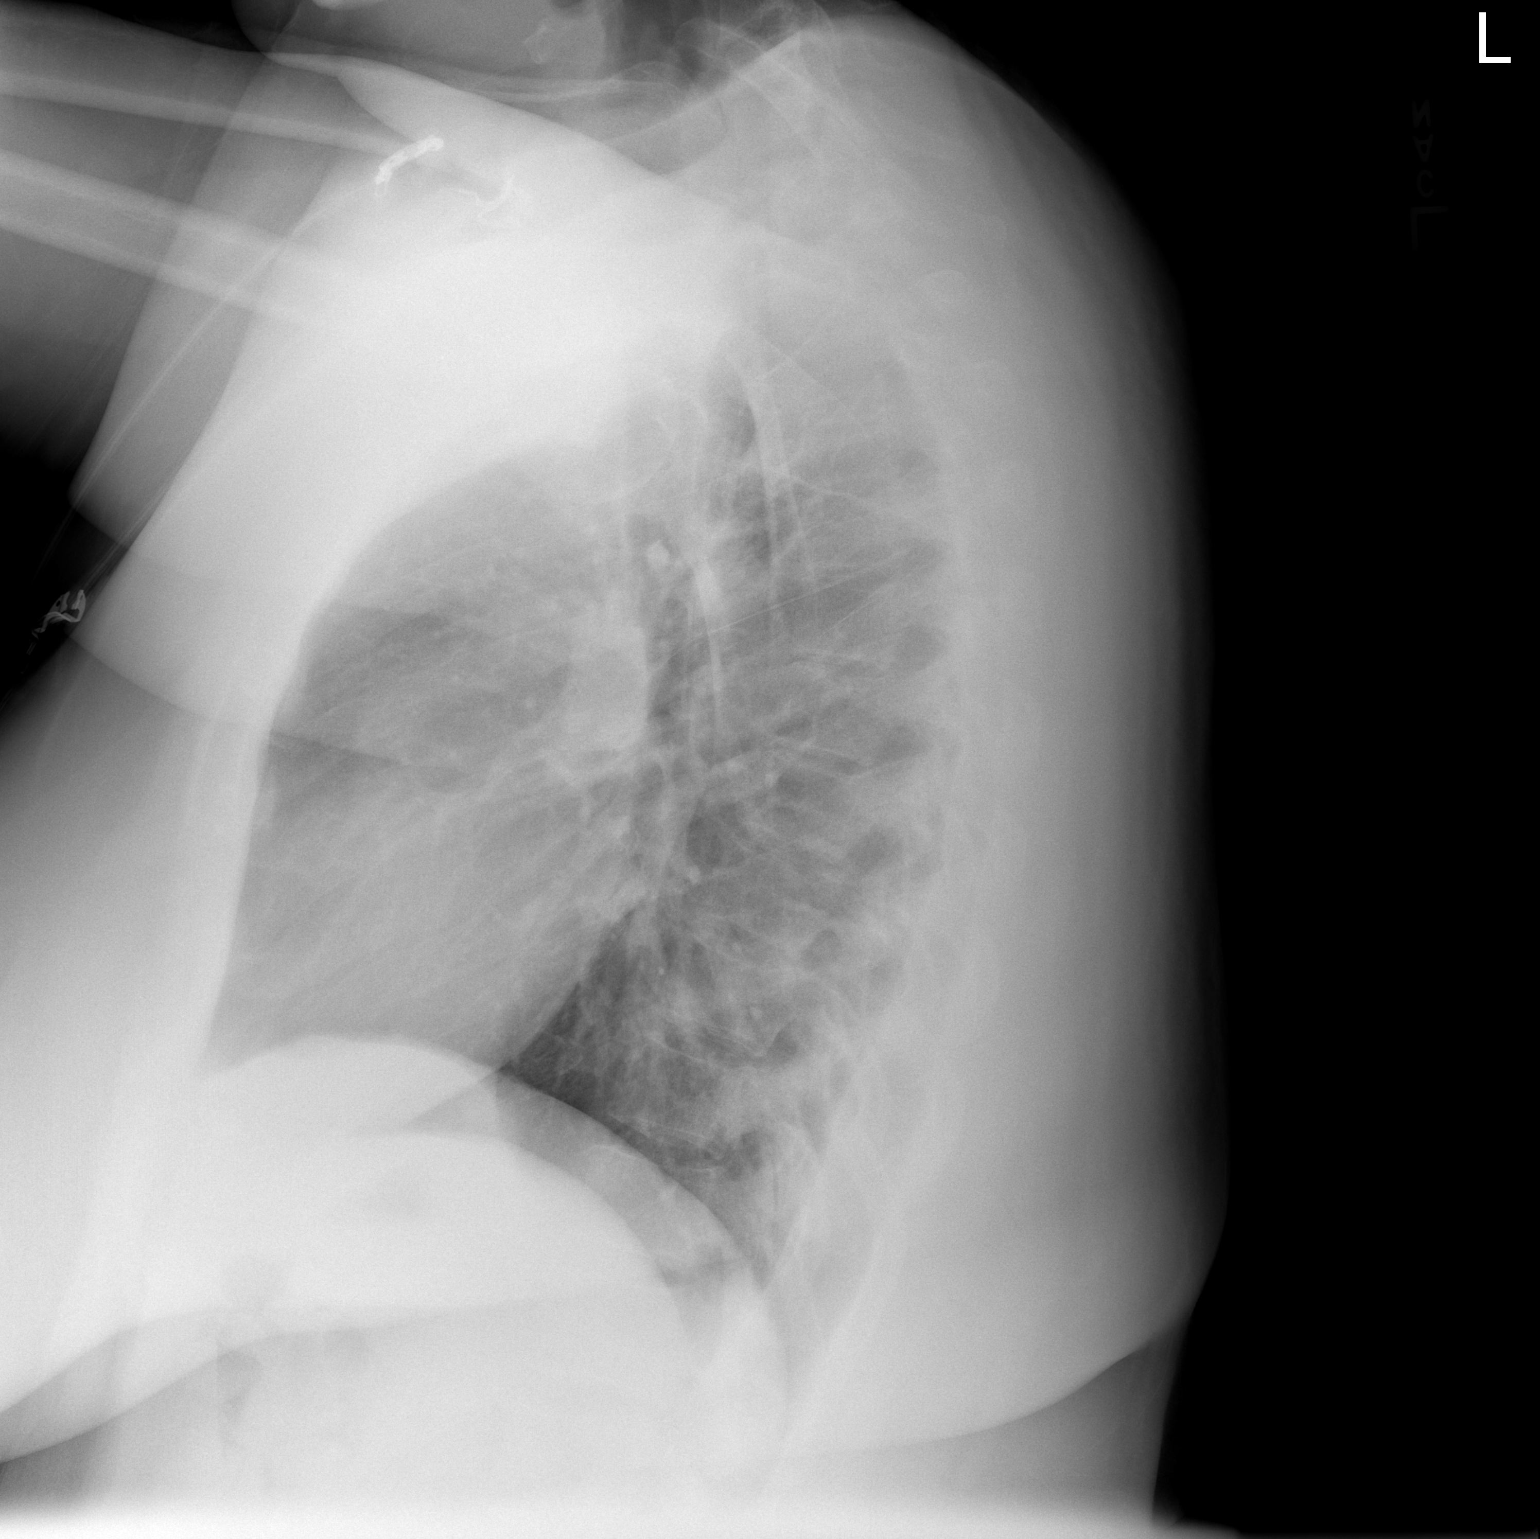

[2 of 2 positions shown; findings below may reference images not displayed]

FINDINGS: Normal heart, mediastinum hila.

Clear lungs.  No pleural effusion.  No pneumothorax.

The bony thorax is unremarkable.
IMPRESSION: Normal chest radiographs.

## 2014-07-07 MED ORDER — GI COCKTAIL ~~LOC~~
30.0000 mL | Freq: Once | ORAL | Status: AC
  Administered 2014-07-07: 30 mL via ORAL
  Filled 2014-07-07: qty 30

## 2014-07-07 MED ORDER — RANITIDINE HCL 150 MG PO TABS: 150.0000 mg | ORAL_TABLET | Freq: Two times a day (BID) | ORAL | Status: DC

## 2014-07-07 MED ORDER — SUCRALFATE 1 GM/10ML PO SUSP: 1.0000 g | Freq: Three times a day (TID) | ORAL | Status: DC

## 2014-07-07 MED ORDER — SODIUM CHLORIDE 0.9 % IV BOLUS (SEPSIS)
500.0000 mL | Freq: Once | INTRAVENOUS | Status: AC
  Administered 2014-07-07: 500 mL via INTRAVENOUS

## 2014-07-07 NOTE — ED Notes (Signed)
Chest pain and dizziness. Dizziness started a week ago and the crampy chest pain for a few days.

## 2014-07-07 NOTE — Discharge Instructions (Signed)
Chest Pain (Nonspecific) It is often hard to give a diagnosis for the cause of chest pain. There is always a chance that your pain could be related to something serious, such as a heart attack or a blood clot in the lungs. You need to follow up with your doctor. HOME CARE  If antibiotic medicine was given, take it as directed by your doctor. Finish the medicine even if you start to feel better.  For the next few days, avoid activities that bring on chest pain. Continue physical activities as told by your doctor.  Do not use any tobacco products. This includes cigarettes, chewing tobacco, and e-cigarettes.  Avoid drinking alcohol.  Only take medicine as told by your doctor.  Follow your doctor's suggestions for more testing if your chest pain does not go away.  Keep all doctor visits you made. GET HELP IF:  Your chest pain does not go away, even after treatment.  You have a rash with blisters on your chest.  You have a fever. GET HELP RIGHT AWAY IF:   You have more pain or pain that spreads to your arm, neck, jaw, back, or belly (abdomen).  You have shortness of breath.  You cough more than usual or cough up blood.  You have very bad back or belly pain.  You feel sick to your stomach (nauseous) or throw up (vomit).  You have very bad weakness.  You pass out (faint).  You have chills. This is an emergency. Do not wait to see if the problems will go away. Call your local emergency services (911 in [place] Do not drive yourself to the hospital. MAKE SURE YOU:   Understand these instructions.  Will watch your condition.  Will get help right away if you are not doing well or get worse. Document Released: 05/26/2008 Document Revised: 12/13/2013 Document Reviewed: 05/26/2008 [facility] Patient Information 2015 [place], [place]. This information is not intended to replace advice given to you by your health care provider. Make sure you discuss any questions you have with your  health care provider.  Dizziness Dizziness is a common problem. It is a feeling of unsteadiness or light-headedness. You may feel like you are about to faint. Dizziness can lead to injury if you stumble or fall. A person of any age group can suffer from dizziness, but dizziness is more common in older adults. CAUSES  Dizziness can be caused by many different things, including:  Middle ear problems.  Standing for too long.  Infections.  An allergic reaction.  Aging.  An emotional response to something, such as the sight of blood.  Side effects of medicines.  Tiredness.  Problems with circulation or blood pressure.  Excessive use of alcohol or medicines, or illegal drug use.  Breathing too fast (hyperventilation).  An irregular heart rhythm (arrhythmia).  A low red blood cell count (anemia).  Pregnancy.  Vomiting, diarrhea, fever, or other illnesses that cause body fluid loss (dehydration).  Diseases or conditions such as Parkinson's disease, high blood pressure (hypertension), diabetes, and thyroid problems.  Exposure to extreme heat. DIAGNOSIS  Your health care provider will ask about your symptoms, perform a physical exam, and perform an electrocardiogram (ECG) to record the electrical activity of your heart. Your health care provider may also perform other heart or blood tests to determine the cause of your dizziness. These may include:  Transthoracic echocardiogram (TTE). During echocardiography, sound waves are used to evaluate how blood flows through your heart.  Transesophageal echocardiogram (TEE).  Cardiac monitoring. This allows your health care provider to monitor your heart rate and rhythm in real time.  Holter monitor. This is a portable device that records your heartbeat and can help diagnose heart arrhythmias. It allows your health care provider to track your heart activity for several days if needed.  Stress tests by exercise or by giving medicine  that makes the heart beat faster. TREATMENT  Treatment of dizziness depends on the cause of your symptoms and can vary greatly. HOME CARE INSTRUCTIONS   Drink enough fluids to keep your urine clear or pale yellow. This is especially important in very hot weather. In older adults, it is also important in cold weather.  Take your medicine exactly as directed if your dizziness is caused by medicines. When taking blood pressure medicines, it is especially important to get up slowly.  Rise slowly from chairs and steady yourself until you feel okay.  In the morning, first sit up on the side of the bed. When you feel okay, stand slowly while holding onto something until you know your balance is fine.  Move your legs often if you need to stand in one place for a long time. Tighten and relax your muscles in your legs while standing.  Have someone stay with you for 1-2 days if dizziness continues to be a problem. Do this until you feel you are well enough to stay alone. Have the person call your health care provider if he or she notices changes in you that are concerning.  Do not drive or use heavy machinery if you feel dizzy.  Do not drink alcohol. SEEK IMMEDIATE MEDICAL CARE IF:   Your dizziness or light-headedness gets worse.  You feel nauseous or vomit.  You have problems talking, walking, or using your arms, hands, or legs.  You feel weak.  You are not thinking clearly or you have trouble forming sentences. It may take a friend or family member to notice this.  You have chest pain, abdominal pain, shortness of breath, or sweating.  Your vision changes.  You notice any bleeding.  You have side effects from medicine that seems to be getting worse rather than better. MAKE SURE YOU:   Understand these instructions.  Will watch your condition.  Will get help right away if you are not doing well or get worse. Document Released: 06/03/2001 Document Revised: 12/13/2013 Document  Reviewed: 06/27/2011 [facility] Patient Information 2015 [place], [place]. This information is not intended to replace advice given to you by your health care provider. Make sure you discuss any questions you have with your health care provider.  Headaches, Frequently Asked Questions MIGRAINE HEADACHES Q: What is migraine? What causes it? How can I treat it? A: Generally, migraine headaches begin as a dull ache. Then they develop into a constant, throbbing, and pulsating pain. You may experience pain at the temples. You may experience pain at the front or back of one or both sides of the head. The pain is usually accompanied by a combination of:  Nausea.  Vomiting.  Sensitivity to light and noise. Some people (about 15%) experience an aura (see below) before an attack. The cause of migraine is believed to be chemical reactions in the brain. Treatment for migraine may include over-the-counter or prescription medications. It may also include self-help techniques. These include relaxation training and biofeedback.  Q: What is an aura? A: About 15% of people with migraine get an "aura". This is a sign of neurological symptoms that occur before  a migraine headache. You may see wavy or jagged lines, dots, or flashing lights. You might experience tunnel vision or blind spots in one or both eyes. The aura can include visual or auditory hallucinations (something imagined). It may include disruptions in smell (such as strange odors), taste or touch. Other symptoms include:  Numbness.  A "pins and needles" sensation.  Difficulty in recalling or speaking the correct word. These neurological events may last as long as 60 minutes. These symptoms will fade as the headache begins. Q: What is a trigger? A: Certain physical or environmental factors can lead to or "trigger" a migraine. These include:  Foods.  Hormonal changes.  Weather.  Stress. It is important to remember that triggers are different for  everyone. To help prevent migraine attacks, you need to figure out which triggers affect you. Keep a headache diary. This is a good way to track triggers. The diary will help you talk to your healthcare professional about your condition. Q: Does weather affect migraines? A: Bright sunshine, hot, humid conditions, and drastic changes in barometric pressure may lead to, or "trigger," a migraine attack in some people. But studies have shown that weather does not act as a trigger for everyone with migraines. Q: What is the link between migraine and hormones? A: Hormones start and regulate many of your body's functions. Hormones keep your body in balance within a constantly changing environment. The levels of hormones in your body are unbalanced at times. Examples are during menstruation, pregnancy, or menopause. That can lead to a migraine attack. In fact, about three quarters of all women with migraine report that their attacks are related to the menstrual cycle.  Q: Is there an increased risk of stroke for migraine sufferers? A: The likelihood of a migraine attack causing a stroke is very remote. That is not to say that migraine sufferers cannot have a stroke associated with their migraines. In persons under age 71, the most common associated factor for stroke is migraine headache. But over the course of a person's normal life span, the occurrence of migraine headache may actually be associated with a reduced risk of dying from cerebrovascular disease due to stroke.  Q: What are acute medications for migraine? A: Acute medications are used to treat the pain of the headache after it has started. Examples over-the-counter medications, NSAIDs, ergots, and triptans.  Q: What are the triptans? A: Triptans are the newest class of abortive medications. They are specifically targeted to treat migraine. Triptans are vasoconstrictors. They moderate some chemical reactions in the brain. The triptans work on receptors  in your brain. Triptans help to restore the balance of a neurotransmitter called serotonin. Fluctuations in levels of serotonin are thought to be a main cause of migraine.  Q: Are over-the-counter medications for migraine effective? A: Over-the-counter, or "OTC," medications may be effective in relieving mild to moderate pain and associated symptoms of migraine. But you should see your caregiver before beginning any treatment regimen for migraine.  Q: What are preventive medications for migraine? A: Preventive medications for migraine are sometimes referred to as "prophylactic" treatments. They are used to reduce the frequency, severity, and length of migraine attacks. Examples of preventive medications include antiepileptic medications, antidepressants, beta-blockers, calcium channel blockers, and NSAIDs (nonsteroidal anti-inflammatory drugs). Q: Why are anticonvulsants used to treat migraine? A: During the past few years, there has been an increased interest in antiepileptic drugs for the prevention of migraine. They are sometimes referred to as "anticonvulsants". Both epilepsy and  migraine may be caused by similar reactions in the brain.  Q: Why are antidepressants used to treat migraine? A: Antidepressants are typically used to treat people with depression. They may reduce migraine frequency by regulating chemical levels, such as serotonin, in the brain.  Q: What alternative therapies are used to treat migraine? A: The term "alternative therapies" is often used to describe treatments considered outside the scope of conventional Western medicine. Examples of alternative therapy include acupuncture, acupressure, and yoga. Another common alternative treatment is herbal therapy. Some herbs are believed to relieve headache pain. Always discuss alternative therapies with your caregiver before proceeding. Some herbal products contain arsenic and other toxins. TENSION HEADACHES Q: What is a tension-type  headache? What causes it? How can I treat it? A: Tension-type headaches occur randomly. They are often the result of temporary stress, anxiety, fatigue, or anger. Symptoms include soreness in your temples, a tightening band-like sensation around your head (a "vice-like" ache). Symptoms can also include a pulling feeling, pressure sensations, and contracting head and neck muscles. The headache begins in your forehead, temples, or the back of your head and neck. Treatment for tension-type headache may include over-the-counter or prescription medications. Treatment may also include self-help techniques such as relaxation training and biofeedback. CLUSTER HEADACHES Q: What is a cluster headache? What causes it? How can I treat it? A: Cluster headache gets its name because the attacks come in groups. The pain arrives with little, if any, warning. It is usually on one side of the head. A tearing or bloodshot eye and a runny nose on the same side of the headache may also accompany the pain. Cluster headaches are believed to be caused by chemical reactions in the brain. They have been described as the most severe and intense of any headache type. Treatment for cluster headache includes prescription medication and oxygen. SINUS HEADACHES Q: What is a sinus headache? What causes it? How can I treat it? A: When a cavity in the bones of the face and skull (a sinus) becomes inflamed, the inflammation will cause localized pain. This condition is usually the result of an allergic reaction, a tumor, or an infection. If your headache is caused by a sinus blockage, such as an infection, you will probably have a fever. An x-ray will confirm a sinus blockage. Your caregiver's treatment might include antibiotics for the infection, as well as antihistamines or decongestants.  REBOUND HEADACHES Q: What is a rebound headache? What causes it? How can I treat it? A: A pattern of taking acute headache medications too often can lead  to a condition known as "rebound headache." A pattern of taking too much headache medication includes taking it more than 2 days per week or in excessive amounts. That means more than the label or a caregiver advises. With rebound headaches, your medications not only stop relieving pain, they actually begin to cause headaches. Doctors treat rebound headache by tapering the medication that is being overused. Sometimes your caregiver will gradually substitute a different type of treatment or medication. Stopping may be a challenge. Regularly overusing a medication increases the potential for serious side effects. Consult a caregiver if you regularly use headache medications more than 2 days per week or more than the label advises. ADDITIONAL QUESTIONS AND ANSWERS Q: What is biofeedback? A: Biofeedback is a self-help treatment. Biofeedback uses special equipment to monitor your body's involuntary physical responses. Biofeedback monitors:  Breathing.  Pulse.  Heart rate.  Temperature.  Muscle tension.  Brain activity.  Biofeedback helps you refine and perfect your relaxation exercises. You learn to control the physical responses that are related to stress. Once the technique has been mastered, you do not need the equipment any more. Q: Are headaches hereditary? A: Four out of five (80%) of people that suffer report a family history of migraine. Scientists are not sure if this is genetic or a family predisposition. Despite the uncertainty, a child has a 50% chance of having migraine if one parent suffers. The child has a 75% chance if both parents suffer.  Q: Can children get headaches? A: By the time they reach high school, most young people have experienced some type of headache. Many safe and effective approaches or medications can prevent a headache from occurring or stop it after it has begun.  Q: What type of doctor should I see to diagnose and treat my headache? A: Start with your primary  caregiver. Discuss his or her experience and approach to headaches. Discuss methods of classification, diagnosis, and treatment. Your caregiver may decide to recommend you to a headache specialist, depending upon your symptoms or other physical conditions. Having diabetes, allergies, etc., may require a more comprehensive and inclusive approach to your headache. The National Head[employer] Foundation will provide, upon request, a list of [facility] physician members in your state. Document Released: 02/28/2004 Document Revised: 03/01/2012 Document Reviewed: 08/07/2008 [facility] Patient Information 2015 [place], [place]. This information is not intended to replace advice given to you by your health care provider. Make sure you discuss any questions you have with your health care provider.  Palpitations A palpitation is the feeling that your heartbeat is irregular or is faster than normal. It may feel like your heart is fluttering or skipping a beat. Palpitations are usually not a serious problem. However, in some cases, you may need further medical evaluation. CAUSES  Palpitations can be caused by:  Smoking.  Caffeine or other stimulants, such as diet pills or energy drinks.  Alcohol.  Stress and anxiety.  Strenuous physical activity.  Fatigue.  Certain medicines.  Heart disease, especially if you have a history of irregular heart rhythms (arrhythmias), such as atrial fibrillation, atrial flutter, or supraventricular tachycardia.  An improperly working pacemaker or defibrillator. DIAGNOSIS  To find the cause of your palpitations, your health care provider will take your medical history and perform a physical exam. Your health care provider may also have you take a test called an ambulatory electrocardiogram (ECG). An ECG records your heartbeat patterns over a 24-hour period. You may also have other tests, such as:  Transthoracic echocardiogram (TTE). During echocardiography, sound waves are used to  evaluate how blood flows through your heart.  Transesophageal echocardiogram (TEE).  Cardiac monitoring. This allows your health care provider to monitor your heart rate and rhythm in real time.  Holter monitor. This is a portable device that records your heartbeat and can help diagnose heart arrhythmias. It allows your health care provider to track your heart activity for several days, if needed.  Stress tests by exercise or by giving medicine that makes the heart beat faster. TREATMENT  Treatment of palpitations depends on the cause of your symptoms and can vary greatly. Most cases of palpitations do not require any treatment other than time, relaxation, and monitoring your symptoms. Other causes, such as atrial fibrillation, atrial flutter, or supraventricular tachycardia, usually require further treatment. HOME CARE INSTRUCTIONS   Avoid:  Caffeinated coffee, tea, soft drinks, diet pills, and energy drinks.  Chocolate.  Alcohol.  Stop  smoking if you smoke.  Reduce your stress and anxiety. Things that can help you relax include:  A method of controlling things in your body, such as your heartbeats, with your mind (biofeedback).  Yoga.  Meditation.  Physical activity such as swimming, jogging, or walking.  Get plenty of rest and sleep. SEEK MEDICAL CARE IF:   You continue to have a fast or irregular heartbeat beyond 24 hours.  Your palpitations occur more often. SEEK IMMEDIATE MEDICAL CARE IF:  You have chest pain or shortness of breath.  You have a severe headache.  You feel dizzy or you faint. MAKE SURE YOU:  Understand these instructions.  Will watch your condition.  Will get help right away if you are not doing well or get worse. Document Released: 12/05/2000 Document Revised: 12/13/2013 Document Reviewed: 02/06/2012 [facility] Patient Information 2015 [place], [place]. This information is not intended to replace advice given to you by your health care  provider. Make sure you discuss any questions you have with your health care provider. Gastroesophageal Reflux Disease, Adult Gastroesophageal reflux disease (GERD) happens when acid from your stomach flows up into the esophagus. When acid comes in contact with the esophagus, the acid causes soreness (inflammation) in the esophagus. Over time, GERD may create small holes (ulcers) in the lining of the esophagus. CAUSES   Increased body weight. This puts pressure on the stomach, making acid rise from the stomach into the esophagus.  Smoking. This increases acid production in the stomach.  Drinking alcohol. This causes decreased pressure in the lower esophageal sphincter (valve or ring of muscle between the esophagus and stomach), allowing acid from the stomach into the esophagus.  Late evening meals and a full stomach. This increases pressure and acid production in the stomach.  A malformed lower esophageal sphincter. Sometimes, no cause is found. SYMPTOMS   Burning pain in the lower part of the mid-chest behind the breastbone and in the mid-stomach area. This may occur twice a week or more often.  Trouble swallowing.  Sore throat.  Dry cough.  Asthma-like symptoms including chest tightness, shortness of breath, or wheezing. DIAGNOSIS  Your caregiver may be able to diagnose GERD based on your symptoms. In some cases, X-rays and other tests may be done to check for complications or to check the condition of your stomach and esophagus. TREATMENT  Your caregiver may recommend over-the-counter or prescription medicines to help decrease acid production. Ask your caregiver before starting or adding any new medicines.  HOME CARE INSTRUCTIONS   Change the factors that you can control. Ask your caregiver for guidance concerning weight loss, quitting smoking, and alcohol consumption.  Avoid foods and drinks that make your symptoms worse, such as:  Caffeine or alcoholic  drinks.  Chocolate.  Peppermint or mint flavorings.  Garlic and onions.  Spicy foods.  Citrus fruits, such as oranges, lemons, or limes.  Tomato-based foods such as sauce, chili, salsa, and pizza.  Fried and fatty foods.  Avoid lying down for the 3 hours prior to your bedtime or prior to taking a nap.  Eat small, frequent meals instead of large meals.  Wear loose-fitting clothing. Do not wear anything tight around your waist that causes pressure on your stomach.  Raise the head of your bed 6 to 8 inches with wood blocks to help you sleep. Extra pillows will not help.  Only take over-the-counter or prescription medicines for pain, discomfort, or fever as directed by your caregiver.  Do not take aspirin, ibuprofen, or  other nonsteroidal anti-inflammatory drugs (NSAIDs). SEEK IMMEDIATE MEDICAL CARE IF:   You have pain in your arms, neck, jaw, teeth, or back.  Your pain increases or changes in intensity or duration.  You develop nausea, vomiting, or sweating (diaphoresis).  You develop shortness of breath, or you faint.  Your vomit is green, yellow, black, or looks like coffee grounds or blood.  Your stool is red, bloody, or black. These symptoms could be signs of other problems, such as heart disease, gastric bleeding, or esophageal bleeding. MAKE SURE YOU:   Understand these instructions.  Will watch your condition.  Will get help right away if you are not doing well or get worse. Document Released: 09/17/2005 Document Revised: 03/01/2012 Document Reviewed: 06/27/2011 [facility] Patient Information 2015 [place], [place]. This information is not intended to replace advice given to you by your health care provider. Make sure you discuss any questions you have with your health care provider.

## 2014-07-07 NOTE — ED Provider Notes (Signed)
CSN: [id number]     Arrival date & time 07/07/14  1841 History  This chart was scribed for [name], * by Irene Pap, ED Scribe. This patient was seen in room MH02/MH02 and patient care was started at 6:47 PM.    Chief Complaint  Patient presents with  . Chest Pain   The history is provided by the patient. No language interpreter was used.   HPI Comments: Krista Fisher is a 38 y.o. female with a history of HTN who presents to the Emergency Department complaining of chest pain onset one week ago. She reports associated lightheadedness, tachycardia, dizziness, and headaches with swelling in her ankles. She reports a history of GERD and has had a lot of indigestion over the past 3 days for which she has been taking OTC omeprazole for with no relief. She reports that her BP is currently 96/60, which she states is lower than her usual BP. She denies any ulcers, melena, hematochezia, vomiting, or diarrhea.   Past Medical History  Diagnosis Date  . GERD (gastroesophageal reflux disease)    Past Surgical History  Procedure Laterality Date  . Cholecystectomy    . Gastrectomy    . Cesarean section     No family history on file. History  Substance Use Topics  . Smoking status: Not on file  . Smokeless tobacco: Not on file  . Alcohol Use: Not on file   OB History   Grav Para Term Preterm Abortions TAB SAB Ect Mult Living                 Review of Systems  Cardiovascular: Positive for chest pain and leg swelling.  Gastrointestinal: Negative for vomiting, diarrhea and blood in stool.  Neurological: Positive for dizziness, light-headedness and headaches.  All other systems reviewed and are negative.  Allergies  Latex  Home Medications   Prior to Admission medications   Medication Sig Start Date End Date Taking? Authorizing Provider  OMEPRAZOLE PO Take by mouth.   Yes Historical Provider, MD  ranitidine (ZANTAC) 150 MG tablet Take 1 tablet (150 mg total) by mouth 2  (two) times daily. 07/07/14   [name], MD  sucralfate (CARAFATE) 1 GM/10ML suspension Take 10 mLs (1 g total) by mouth 4 (four) times daily -  with meals and at bedtime. 07/07/14   [name], MD   Pulse 91  Temp(Src) 98.3 F (36.8 C) (Oral)  Resp 23  Ht 5\' 6"  (1.676 m)  Wt 315 lb (142.883 kg)  BMI 50.87 kg/m2  SpO2 100% Physical Exam  Constitutional: She is oriented to person, place, and time. She appears well-developed and well-nourished. No distress.  HENT:  Head: Normocephalic and atraumatic.  Right Ear: Hearing normal.  Left Ear: Hearing normal.  Nose: Nose normal.  Mouth/Throat: Oropharynx is clear and moist and mucous membranes are normal.  Eyes: Conjunctivae and EOM are normal. Pupils are equal, round, and reactive to light.  Neck: Normal range of motion. Neck supple.  Cardiovascular: Regular rhythm, S1 normal and S2 normal.  Exam reveals no gallop and no friction rub.   No murmur heard. Pulmonary/Chest: Effort normal and breath sounds normal. No respiratory distress. She exhibits no tenderness.  Abdominal: Soft. Normal appearance and bowel sounds are normal. There is no hepatosplenomegaly. There is no tenderness. There is no rebound, no guarding, no tenderness at McBurney's point and negative Murphy's sign. No hernia.  Musculoskeletal: Normal range of motion.  Neurological: She is alert and  oriented to person, place, and time. She has normal strength. No cranial nerve deficit or sensory deficit. Coordination normal. GCS eye subscore is 4. GCS verbal subscore is 5. GCS motor subscore is 6.  Skin: Skin is warm, dry and intact. No rash noted. No cyanosis.  Psychiatric: She has a normal mood and affect. Her speech is normal and behavior is normal. Thought content normal.    ED Course  Procedures (including critical care time) DIAGNOSTIC STUDIES: Oxygen Saturation is 100% on room air, normal by my interpretation.    COORDINATION OF CARE: 6:50  PM-Discussed treatment plan which includes EKG and labs with pt at bedside and pt agreed to plan.   Labs Review Labs Reviewed  CBC WITH DIFFERENTIAL - Abnormal; Notable for the following:    MCV 76.2 (*)    All other components within normal limits  COMPREHENSIVE METABOLIC PANEL - Abnormal; Notable for the following:    Potassium 3.6 (*)    Glucose, Bld 109 (*)    GFR calc non Af Amer 63 (*)    GFR calc Af Amer 73 (*)    All other components within normal limits  URINALYSIS, ROUTINE W REFLEX MICROSCOPIC - Abnormal; Notable for the following:    APPearance CLOUDY (*)    All other components within normal limits  LIPASE, BLOOD  TROPONIN I  PRO B NATRIURETIC PEPTIDE    Imaging Review Dg Chest 2 View  07/07/2014   CLINICAL DATA:  Chest pain.  EXAM: CHEST  2 VIEW  COMPARISON:  01/19/2011  FINDINGS: Normal heart, mediastinum hila.  Clear lungs.  No pleural effusion.  No pneumothorax.  The bony thorax is unremarkable.  IMPRESSION: Normal chest radiographs.   Electronically Signed   By: Lajean Manes M.D.   On: 07/07/2014 19:15   Ct Head Wo Contrast  07/07/2014   CLINICAL DATA:  Headache, dizziness.  EXAM: CT HEAD WITHOUT CONTRAST  TECHNIQUE: Contiguous axial images were obtained from the base of the skull through the vertex without intravenous contrast.  COMPARISON:  None.  FINDINGS: Bony calvarium appears intact. No mass effect or midline shift is noted. Ventricular size is within normal limits. There is no evidence of mass lesion, hemorrhage or acute infarction.  IMPRESSION: Normal head CT.   Electronically Signed   By: Sabino Dick M.D.   On: 07/07/2014 19:19     EKG Interpretation   Date/Time:  Friday July 07 2014 18:50:24 EDT Ventricular Rate:  82 PR Interval:  222 QRS Duration: 84 QT Interval:  350 QTC Calculation: 408 R Axis:   47 Text Interpretation:  Sinus rhythm with sinus arrhythmia with 1st degree  A-V block Nonspecific T wave abnormality Abnormal ECG No significant   change since last tracing Confirmed by Krina Mraz  MD, Bobbi Yount [phone]) on  07/07/2014 7:25:16 PM      MDM   Final diagnoses:  Headache above the eye region  Dizziness  Chest pain, unspecified chest pain type  Palpitations  Gastroesophageal reflux disease, esophagitis presence not specified    Patient presented with multiple complaints. Patient reports that she has been experiencing dizziness for one week. It has been associated with headache. She had a normal neurologic exam. CT of head was performed and was negative.  Patient reports intermittent chest discomfort with indigestion. She has history of reflux, has been taking over-the-counter omeprazole without improvement. She had a benign, nontender abdominal exam. Patient has not noticed any blood in her stools, melena. Her hemoglobin is normal. She does have  a GI doctor that she followup with, recommended that she take Zantac and Carafate in addition to the omeprazole.  Patient has been experiencing intermittent heart palpitations. No arrhythmia was seen here in the ER. She had an EKG which was unchanged from previous EKGs. The patient had a normal troponin. She has been having chest discomfort for at least 3 days without relief. It is felt that this is related to her GERD and possibly gastritis. With unchanged cardiac markers and EKG, it is felt that this was not cardiac in etiology. Patient counseled to followup with primary doctor for further evaluation.  I personally performed the services described in this documentation, which was scribed in my presence. The recorded information has been reviewed and is accurate.      [name], MD 07/07/14 2043

## 2014-08-06 ENCOUNTER — Encounter (HOSPITAL_BASED_OUTPATIENT_CLINIC_OR_DEPARTMENT_OTHER): Payer: Self-pay | Admitting: Emergency Medicine

## 2014-08-06 ENCOUNTER — Emergency Department (HOSPITAL_BASED_OUTPATIENT_CLINIC_OR_DEPARTMENT_OTHER): Payer: No Typology Code available for payment source

## 2014-08-06 ENCOUNTER — Emergency Department (HOSPITAL_BASED_OUTPATIENT_CLINIC_OR_DEPARTMENT_OTHER)
Admission: EM | Admit: 2014-08-06 | Discharge: 2014-08-06 | Disposition: A | Discharge status: Home / Self Care | Payer: No Typology Code available for payment source | Attending: Emergency Medicine | Admitting: Emergency Medicine

## 2014-08-06 DIAGNOSIS — Z3202 Encounter for pregnancy test, result negative: Secondary | ICD-10-CM | POA: Diagnosis not present

## 2014-08-06 DIAGNOSIS — Z91040 Latex allergy status: Secondary | ICD-10-CM | POA: Diagnosis not present

## 2014-08-06 DIAGNOSIS — S59909A Unspecified injury of unspecified elbow, initial encounter: Secondary | ICD-10-CM | POA: Diagnosis present

## 2014-08-06 DIAGNOSIS — S6990XA Unspecified injury of unspecified wrist, hand and finger(s), initial encounter: Secondary | ICD-10-CM | POA: Diagnosis present

## 2014-08-06 DIAGNOSIS — Z79899 Other long term (current) drug therapy: Secondary | ICD-10-CM | POA: Insufficient documentation

## 2014-08-06 DIAGNOSIS — S66912A Strain of unspecified muscle, fascia and tendon at wrist and hand level, left hand, initial encounter: Secondary | ICD-10-CM

## 2014-08-06 DIAGNOSIS — S63509A Unspecified sprain of unspecified wrist, initial encounter: Secondary | ICD-10-CM | POA: Diagnosis not present

## 2014-08-06 DIAGNOSIS — Y9389 Activity, other specified: Secondary | ICD-10-CM | POA: Diagnosis not present

## 2014-08-06 DIAGNOSIS — Y92410 Unspecified street and highway as the place of occurrence of the external cause: Secondary | ICD-10-CM | POA: Diagnosis not present

## 2014-08-06 DIAGNOSIS — I10 Essential (primary) hypertension: Secondary | ICD-10-CM | POA: Insufficient documentation

## 2014-08-06 DIAGNOSIS — S59919A Unspecified injury of unspecified forearm, initial encounter: Secondary | ICD-10-CM

## 2014-08-06 DIAGNOSIS — K219 Gastro-esophageal reflux disease without esophagitis: Secondary | ICD-10-CM | POA: Insufficient documentation

## 2014-08-06 HISTORY — DX: Essential (primary) hypertension: I10

## 2014-08-06 LAB — PREGNANCY, URINE: Preg Test, Ur: NEGATIVE
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
## 2014-08-06 NOTE — ED Notes (Signed)
Pt ambulating independently w/ steady gait on d/c in no acute distress, A&Ox4. D/c instructions reviewed w/ pt - pt denies any further questions or concerns at present.  
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
## 2014-08-07 NOTE — ED Provider Notes (Signed)
Medical screening examination/treatment/procedure(s) were performed by non-physician practitioner and as supervising physician I was immediately available for consultation/collaboration.   EKG Interpretation None       Orlie Dakin, MD 08/07/14 0003

## 2015-04-13 ENCOUNTER — Emergency Department (INDEPENDENT_AMBULATORY_CARE_PROVIDER_SITE_OTHER)
Admission: EM | Admit: 2015-04-13 | Discharge: 2015-04-13 | Disposition: A | Discharge status: Home / Self Care | Payer: No Typology Code available for payment source | Source: Home / Self Care | Attending: Emergency Medicine | Admitting: Emergency Medicine

## 2015-04-13 ENCOUNTER — Encounter (HOSPITAL_COMMUNITY): Payer: Self-pay

## 2015-04-13 ENCOUNTER — Emergency Department (INDEPENDENT_AMBULATORY_CARE_PROVIDER_SITE_OTHER): Payer: No Typology Code available for payment source

## 2015-04-13 DIAGNOSIS — R1011 Right upper quadrant pain: Secondary | ICD-10-CM

## 2015-04-13 LAB — POCT I-STAT, CHEM 8
BUN: 6 mg/dL (ref 6–23)
Calcium, Ion: 1.19 mmol/L (ref 1.12–1.23)
Chloride: 104 mmol/L (ref 96–112)
Creatinine, Ser: 1 mg/dL (ref 0.50–1.10)
Glucose, Bld: 95 mg/dL (ref 70–99)
HCT: 42 % (ref 36.0–46.0)
Hemoglobin: 14.3 g/dL (ref 12.0–15.0)
Potassium: 4.6 mmol/L (ref 3.5–5.1)
Sodium: 140 mmol/L (ref 135–145)
TCO2: 22 mmol/L (ref 0–100)

## 2015-04-13 LAB — CBC WITH DIFFERENTIAL/PLATELET
Basophils Absolute: 0 10*3/uL (ref 0.0–0.1)
Basophils Relative: 0 % (ref 0–1)
Eosinophils Absolute: 0.2 10*3/uL (ref 0.0–0.7)
Eosinophils Relative: 3 % (ref 0–5)
HCT: 38.1 % (ref 36.0–46.0)
Hemoglobin: 13 g/dL (ref 12.0–15.0)
Lymphocytes Relative: 22 % (ref 12–46)
Lymphs Abs: 1.8 10*3/uL (ref 0.7–4.0)
MCH: 25.7 pg — ABNORMAL LOW (ref 26.0–34.0)
MCHC: 34.1 g/dL (ref 30.0–36.0)
MCV: 75.4 fL — ABNORMAL LOW (ref 78.0–100.0)
Monocytes Absolute: 0.8 10*3/uL (ref 0.1–1.0)
Monocytes Relative: 9 % (ref 3–12)
Neutro Abs: 5.6 10*3/uL (ref 1.7–7.7)
Neutrophils Relative %: 66 % (ref 43–77)
Platelets: 305 10*3/uL (ref 150–400)
RBC: 5.05 MIL/uL (ref 3.87–5.11)
RDW: 13.3 % (ref 11.5–15.5)
WBC: 8.5 10*3/uL (ref 4.0–10.5)

## 2015-04-13 LAB — COMPREHENSIVE METABOLIC PANEL
ALT: 15 U/L (ref 0–35)
AST: 24 U/L (ref 0–37)
Albumin: 3.4 g/dL — ABNORMAL LOW (ref 3.5–5.2)
Alkaline Phosphatase: 88 U/L (ref 39–117)
Anion gap: 9 (ref 5–15)
BUN: 6 mg/dL (ref 6–23)
CO2: 25 mmol/L (ref 19–32)
Calcium: 9 mg/dL (ref 8.4–10.5)
Chloride: 104 mmol/L (ref 96–112)
Creatinine, Ser: 1.04 mg/dL (ref 0.50–1.10)
GFR calc Af Amer: 77 mL/min — ABNORMAL LOW (ref >90)
GFR calc non Af Amer: 67 mL/min — ABNORMAL LOW (ref >90)
Glucose, Bld: 93 mg/dL (ref 70–99)
Potassium: 4.6 mmol/L (ref 3.5–5.1)
Sodium: 138 mmol/L (ref 135–145)
Total Bilirubin: 0.3 mg/dL (ref 0.3–1.2)
Total Protein: 7.7 g/dL (ref 6.0–8.3)

## 2015-04-13 LAB — POCT URINALYSIS DIP (DEVICE)
Bilirubin Urine: NEGATIVE
Glucose, UA: NEGATIVE mg/dL
Ketones, ur: NEGATIVE mg/dL
Leukocytes, UA: NEGATIVE
Nitrite: NEGATIVE
Protein, ur: NEGATIVE mg/dL
Specific Gravity, Urine: 1.025 (ref 1.005–1.030)
Urobilinogen, UA: 2 mg/dL — ABNORMAL HIGH (ref 0.0–1.0)
pH: 6 (ref 5.0–8.0)

## 2015-04-13 LAB — POCT PREGNANCY, URINE: Preg Test, Ur: NEGATIVE

## 2015-04-13 IMAGING — DX DG CHEST 2V
2 series · 2 of 2 positions shown · non-contrast
Comparison: [DATE]

CLINICAL DATA: 38-year-old female with a history of cough

EXAM:
CHEST - 2 VIEW

[chest pa]
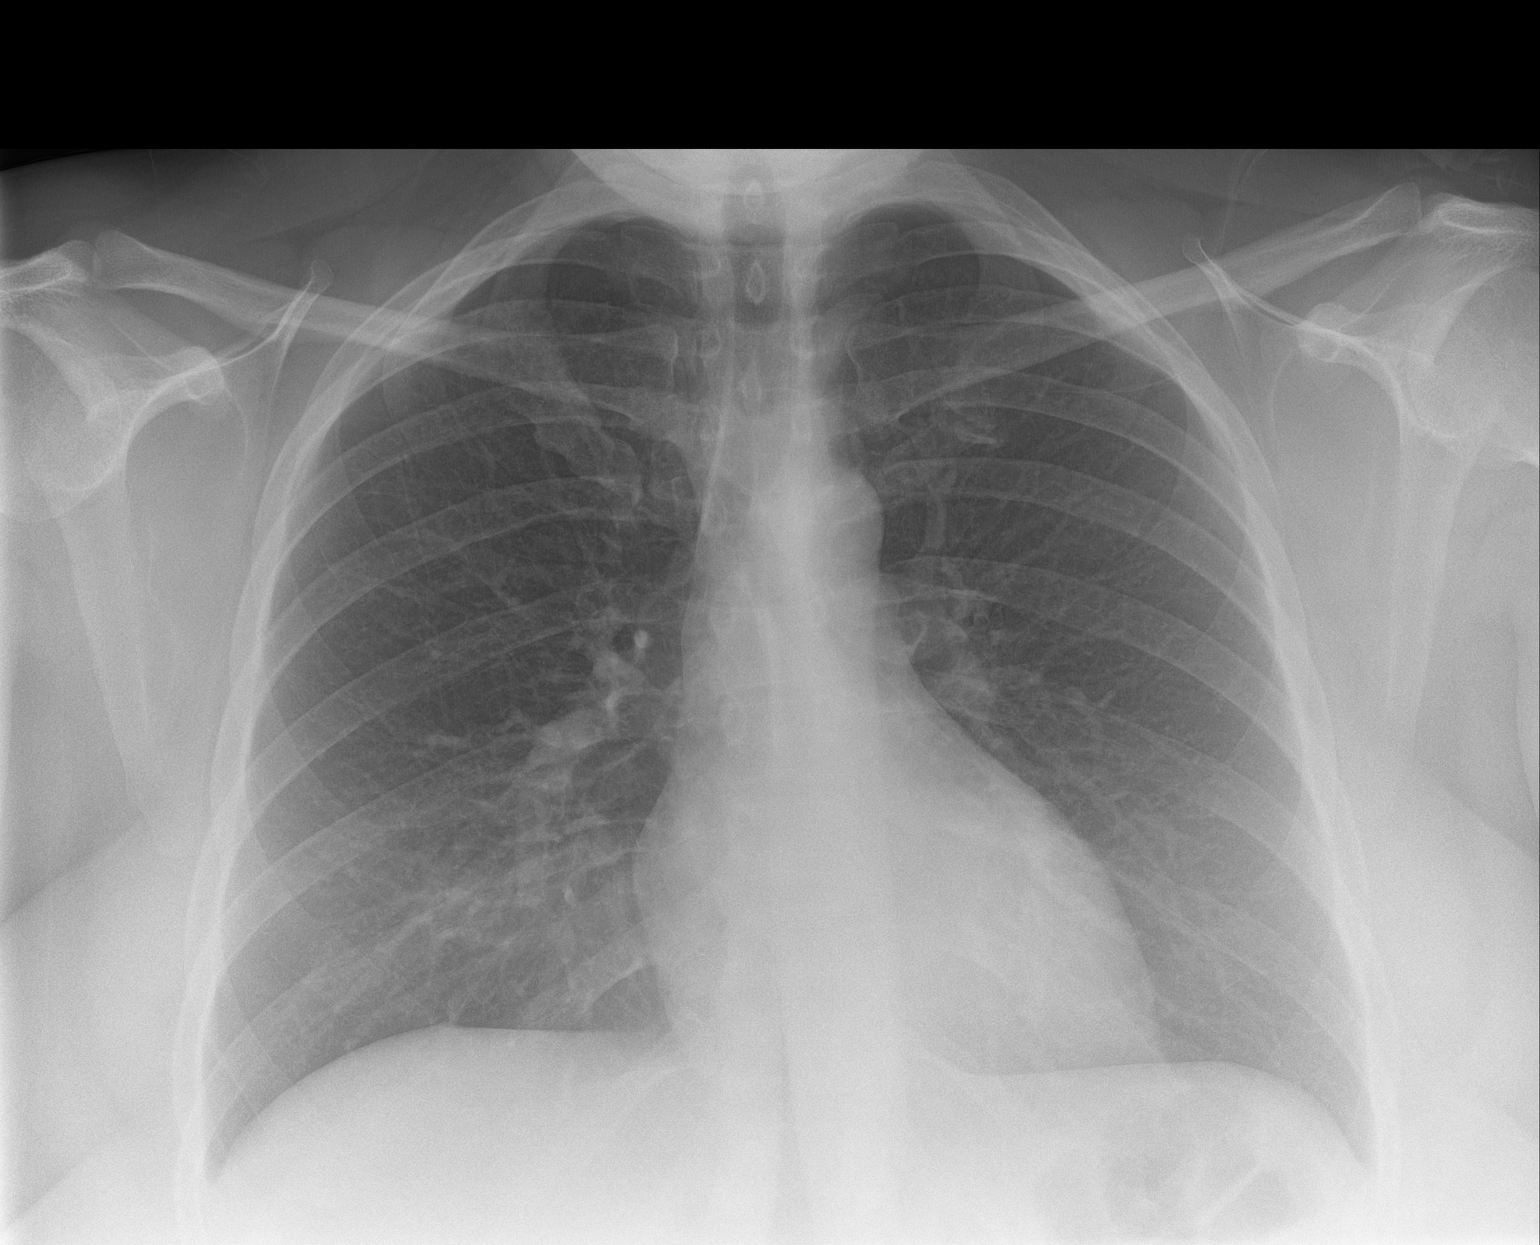

[chest lat]
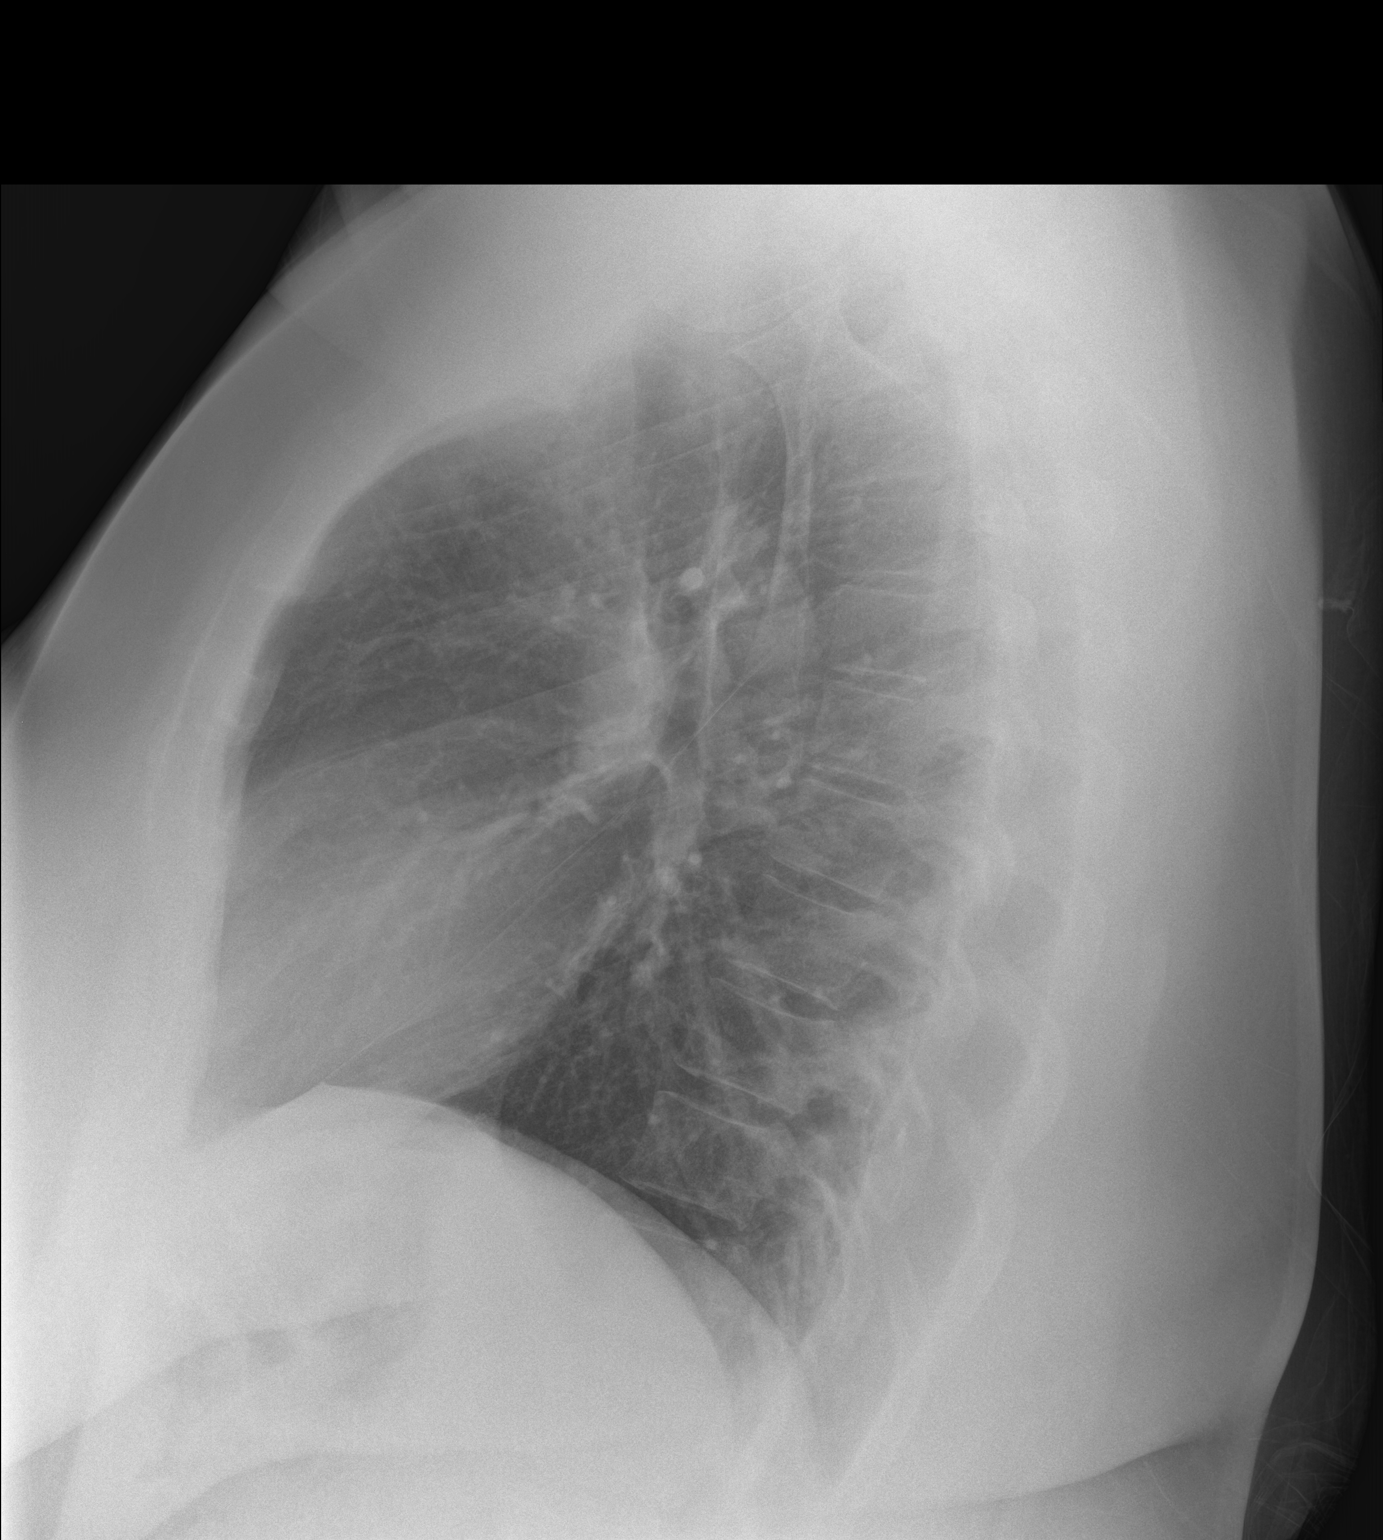

[2 of 2 positions shown; findings below may reference images not displayed]

FINDINGS: Cardiomediastinal silhouette projects within normal limits in size
and contour. No confluent airspace disease, pneumothorax, or pleural
effusion.

No displaced fracture.

Unremarkable appearance of the upper abdomen.
IMPRESSION: No radiographic evidence of acute cardiopulmonary disease.

## 2015-04-13 MED ORDER — HYDROCODONE-ACETAMINOPHEN 5-325 MG PO TABS: 1.0000 | ORAL_TABLET | Freq: Four times a day (QID) | ORAL | Status: DC | PRN

## 2015-04-13 NOTE — ED Notes (Signed)
Discussed medication precautions, follow up plan to call for GI evaluation

## 2015-04-13 NOTE — ED Notes (Signed)
RUQ abdominal pain (gb removed,gastric bypass hist) Denies n/v/d , denies stool changes. C/o feels like gall stones again

## 2015-04-13 NOTE — ED Provider Notes (Signed)
CSN: [id number]     Arrival date & time 04/13/15  1111 History   First MD Initiated Contact with Patient 04/13/15 1136     Chief Complaint  Patient presents with  . Abdominal Pain   (Consider location/radiation/quality/duration/timing/severity/associated sxs/prior Treatment) HPI  She is a 39 year old woman here for evaluation of right upper quadrant pain. She states it feels just like when she had gallstones several years ago, but she had her gallbladder removed in 2013. She reports a constant gnawing ache in the right upper quadrant. This is been present for about 10 days. She will occasionally get a sharp stabbing pain that goes from her epigastric region around to her back. No nausea, vomiting, diarrhea. Her appetite is normal. She is able to eat and drink without difficulty. No fevers. She does report a mild cough and thick mucus. She denies excessive Tylenol or ibuprofen use. No alcohol use. She does have heartburn, related to her gastric bypass surgery. This is stable on omeprazole, and her current pain is different from her heartburn pain.  Past Medical History  Diagnosis Date  . GERD (gastroesophageal reflux disease)   . Hypertension    Past Surgical History  Procedure Laterality Date  . Cholecystectomy    . Gastrectomy    . Cesarean section    . Wrist fracture surgery    . Knee surgery    . Gastric bypass     No family history on file. History  Substance Use Topics  . Smoking status: Never Smoker   . Smokeless tobacco: Not on file  . Alcohol Use: Not on file   OB History    No data available     Review of Systems  Constitutional: Negative for fever and appetite change.  HENT: Negative for sore throat.        Throat mucus  Respiratory: Positive for cough. Negative for shortness of breath.   Gastrointestinal: Positive for abdominal pain (RUQ). Negative for nausea, vomiting, diarrhea and constipation.  Neurological: Negative for dizziness.    Allergies   Latex  Home Medications   Prior to Admission medications   Medication Sig Start Date End Date Taking? Authorizing Provider  HYDROcodone-acetaminophen (NORCO) 5-325 MG per tablet Take 1 tablet by mouth every 6 (six) hours as needed for moderate pain. 04/13/15   Melony Overly, MD  OMEPRAZOLE PO Take by mouth.    Historical Provider, MD  ranitidine (ZANTAC) 150 MG tablet Take 1 tablet (150 mg total) by mouth 2 (two) times daily. 07/07/14   [name], MD  sucralfate (CARAFATE) 1 GM/10ML suspension Take 10 mLs (1 g total) by mouth 4 (four) times daily -  with meals and at bedtime. 07/07/14   [name], MD   BP 107/73 mmHg  Pulse 88  Temp(Src) 98.5 F (36.9 C) (Oral)  Resp 18  SpO2 96%  LMP 04/10/2015 Physical Exam  Constitutional: She is oriented to person, place, and time. She appears well-developed and well-nourished. No distress.  HENT:  Nose: Nose normal.  Mouth/Throat: Oropharynx is clear and moist. No oropharyngeal exudate.  Neck: Neck supple.  Cardiovascular: Normal rate, regular rhythm and normal heart sounds.   No murmur heard. Pulmonary/Chest: Effort normal and breath sounds normal. No respiratory distress. She has no wheezes. She has no rales.  Abdominal: Soft. Bowel sounds are normal. She exhibits no distension. There is tenderness (RUQ). There is no rebound and no guarding.  Lymphadenopathy:    She has cervical adenopathy (submandibular).  Neurological: She  is alert and oriented to person, place, and time.  Skin: Skin is warm and dry. No rash noted.    ED Course  Procedures (including critical care time) Labs Review Labs Reviewed  CBC WITH DIFFERENTIAL/PLATELET - Abnormal; Notable for the following:    MCV 75.4 (*)    MCH 25.7 (*)    All other components within normal limits  COMPREHENSIVE METABOLIC PANEL - Abnormal; Notable for the following:    Albumin 3.4 (*)    GFR calc non Af Amer 67 (*)    GFR calc Af Amer 77 (*)    All other  components within normal limits  POCT URINALYSIS DIP (DEVICE) - Abnormal; Notable for the following:    Hgb urine dipstick TRACE (*)    Urobilinogen, UA 2.0 (*)    All other components within normal limits  POCT PREGNANCY, URINE  POCT I-STAT, CHEM 8    Imaging Review Dg Chest 2 View  04/13/2015   CLINICAL DATA:  39 year old female with a history of cough  EXAM: CHEST - 2 VIEW  COMPARISON:  07/07/2014  FINDINGS: Cardiomediastinal silhouette projects within normal limits in size and contour. No confluent airspace disease, pneumothorax, or pleural effusion.  No displaced fracture.  Unremarkable appearance of the upper abdomen.  IMPRESSION: No radiographic evidence of acute cardiopulmonary disease.  Signed,  Dulcy Fanny. Earleen Newport, DO  [facility]  [facility]   Electronically Signed   By: Corrie Mckusick D.O.   On: 04/13/2015 13:19     MDM   1. RUQ pain    Lab work is reviewed and within normal limits. She has some blood in her urine, but is just coming off her menstrual periods. Chest x-ray is negative. Abdominal exam is benign. We'll discharge with Norco for pain. She will follow-up with Dr. Collene Mares, who she has seen previously.    Melony Overly, MD 04/13/15 1357

## 2015-04-13 NOTE — Discharge Instructions (Signed)
All of your blood work is normal. I am unsure why you are having this pain. Use the Norco every 4-6 hours as needed for severe pain. Avoid Advil, Aleve, ibuprofen. Please make an appointment to see Dr. Collene Mares for additional evaluation.

## 2015-05-12 ENCOUNTER — Emergency Department (HOSPITAL_BASED_OUTPATIENT_CLINIC_OR_DEPARTMENT_OTHER): Payer: Medicaid Other

## 2015-05-12 ENCOUNTER — Encounter (HOSPITAL_BASED_OUTPATIENT_CLINIC_OR_DEPARTMENT_OTHER): Payer: Self-pay | Admitting: Emergency Medicine

## 2015-05-12 ENCOUNTER — Emergency Department (HOSPITAL_BASED_OUTPATIENT_CLINIC_OR_DEPARTMENT_OTHER)
Admission: EM | Admit: 2015-05-12 | Discharge: 2015-05-12 | Disposition: A | Discharge status: Home / Self Care | Payer: Medicaid Other | Attending: Emergency Medicine | Admitting: Emergency Medicine

## 2015-05-12 DIAGNOSIS — R110 Nausea: Secondary | ICD-10-CM | POA: Insufficient documentation

## 2015-05-12 DIAGNOSIS — Z91040 Latex allergy status: Secondary | ICD-10-CM | POA: Diagnosis not present

## 2015-05-12 DIAGNOSIS — Z9089 Acquired absence of other organs: Secondary | ICD-10-CM | POA: Insufficient documentation

## 2015-05-12 DIAGNOSIS — Z79899 Other long term (current) drug therapy: Secondary | ICD-10-CM | POA: Diagnosis not present

## 2015-05-12 DIAGNOSIS — R1011 Right upper quadrant pain: Secondary | ICD-10-CM | POA: Diagnosis not present

## 2015-05-12 DIAGNOSIS — Z9884 Bariatric surgery status: Secondary | ICD-10-CM | POA: Insufficient documentation

## 2015-05-12 DIAGNOSIS — R1013 Epigastric pain: Secondary | ICD-10-CM | POA: Diagnosis present

## 2015-05-12 DIAGNOSIS — Z9889 Other specified postprocedural states: Secondary | ICD-10-CM | POA: Insufficient documentation

## 2015-05-12 DIAGNOSIS — K219 Gastro-esophageal reflux disease without esophagitis: Secondary | ICD-10-CM | POA: Insufficient documentation

## 2015-05-12 DIAGNOSIS — I10 Essential (primary) hypertension: Secondary | ICD-10-CM | POA: Diagnosis not present

## 2015-05-12 LAB — COMPREHENSIVE METABOLIC PANEL
ALT: 20 U/L (ref 14–54)
AST: 27 U/L (ref 15–41)
Albumin: 3.5 g/dL (ref 3.5–5.0)
Alkaline Phosphatase: 87 U/L (ref 38–126)
Anion gap: 11 (ref 5–15)
BUN: 10 mg/dL (ref 6–20)
CO2: 24 mmol/L (ref 22–32)
Calcium: 9.1 mg/dL (ref 8.9–10.3)
Chloride: 106 mmol/L (ref 101–111)
Creatinine, Ser: 1.04 mg/dL — ABNORMAL HIGH (ref 0.44–1.00)
GFR calc Af Amer: >60 mL/min (ref >60)
GFR calc non Af Amer: >60 mL/min (ref >60)
Glucose, Bld: 106 mg/dL — ABNORMAL HIGH (ref 65–99)
Potassium: 3.9 mmol/L (ref 3.5–5.1)
Sodium: 141 mmol/L (ref 135–145)
Total Bilirubin: 0.3 mg/dL (ref 0.3–1.2)
Total Protein: 7.8 g/dL (ref 6.5–8.1)

## 2015-05-12 LAB — URINALYSIS, ROUTINE W REFLEX MICROSCOPIC
Glucose, UA: NEGATIVE mg/dL
Ketones, ur: 15 mg/dL — AB
Leukocytes, UA: NEGATIVE
Nitrite: NEGATIVE
Protein, ur: NEGATIVE mg/dL
Specific Gravity, Urine: 1.03 (ref 1.005–1.030)
Urobilinogen, UA: 1 mg/dL (ref 0.0–1.0)
pH: 5.5 (ref 5.0–8.0)

## 2015-05-12 LAB — CBC WITH DIFFERENTIAL/PLATELET
Basophils Absolute: 0 10*3/uL (ref 0.0–0.1)
Basophils Relative: 0 % (ref 0–1)
Eosinophils Absolute: 0.2 10*3/uL (ref 0.0–0.7)
Eosinophils Relative: 2 % (ref 0–5)
HCT: 38.3 % (ref 36.0–46.0)
Hemoglobin: 13.5 g/dL (ref 12.0–15.0)
Lymphocytes Relative: 25 % (ref 12–46)
Lymphs Abs: 2 10*3/uL (ref 0.7–4.0)
MCH: 26 pg (ref 26.0–34.0)
MCHC: 35.2 g/dL (ref 30.0–36.0)
MCV: 73.8 fL — ABNORMAL LOW (ref 78.0–100.0)
Monocytes Absolute: 0.9 10*3/uL (ref 0.1–1.0)
Monocytes Relative: 11 % (ref 3–12)
Neutro Abs: 4.9 10*3/uL (ref 1.7–7.7)
Neutrophils Relative %: 62 % (ref 43–77)
Platelets: 293 10*3/uL (ref 150–400)
RBC: 5.19 MIL/uL — ABNORMAL HIGH (ref 3.87–5.11)
RDW: 13.3 % (ref 11.5–15.5)
WBC: 8 10*3/uL (ref 4.0–10.5)

## 2015-05-12 LAB — URINE MICROSCOPIC-ADD ON

## 2015-05-12 LAB — LIPASE, BLOOD: Lipase: 33 U/L (ref 22–51)

## 2015-05-12 IMAGING — CT CT ABD-PELV W/ CM
2 of 5 series · 16 of 46 positions shown, 18 images · IV contrast (APPLIED)
Comparison: None.

CLINICAL DATA: Mid epigastric abdominal pain for 6 weeks. History
of cholecystectomy and gastric bypass.

EXAM:
CT ABDOMEN AND PELVIS WITH CONTRAST
TECHNIQUE: Multidetector CT imaging of the abdomen and pelvis was performed
using the standard protocol following bolus administration of
intravenous contrast.
CONTRAST:  100mL OMNIPAQUE IOHEXOL 300 MG/ML  SOLN

[Series 2: abd/pelvis 5.0 b31f · axial · 0.95mm/px · z∈[-692,-236]mm · 13 of 103 slices shown, 15 images]
[im 6/103  soft-tissue]
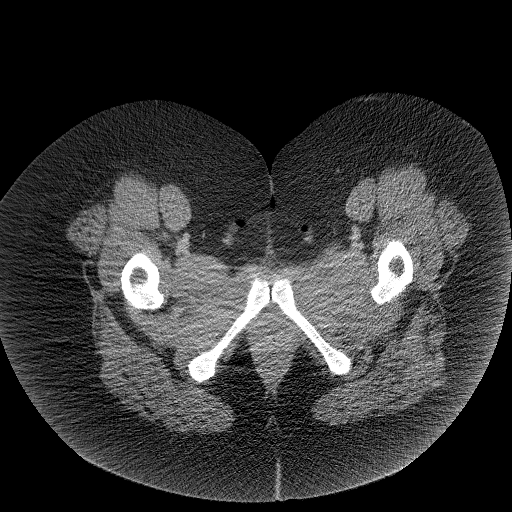
[im 6/103  bone]
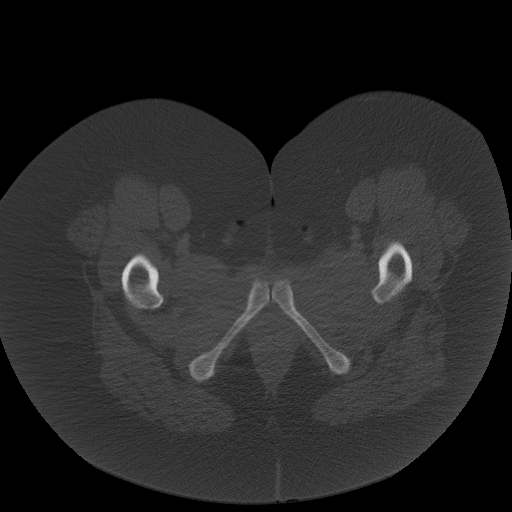
[im 16/103  soft-tissue]
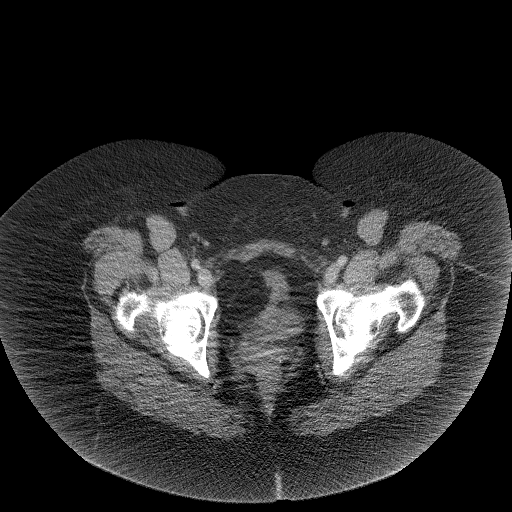
[im 21/103  soft-tissue]
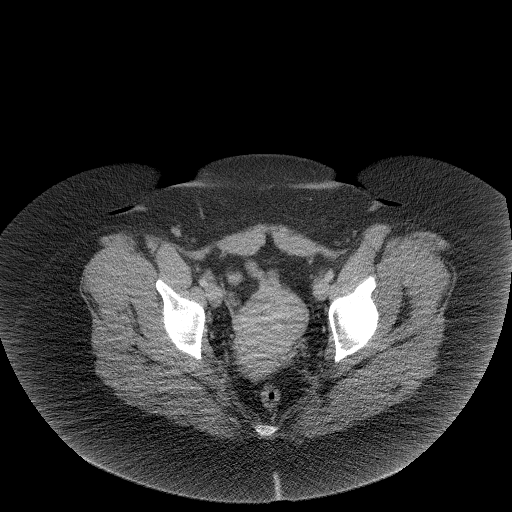
[im 31/103  soft-tissue]
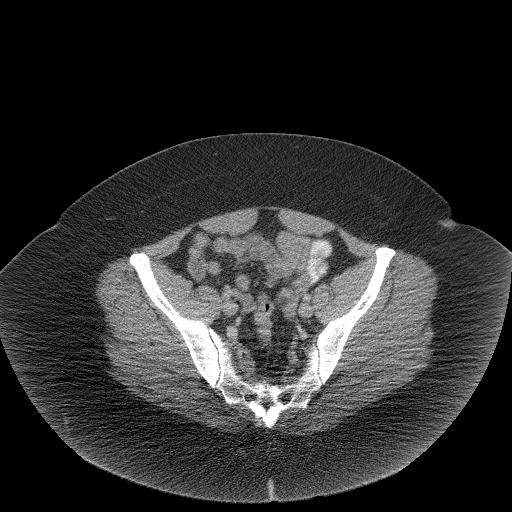
[im 36/103  soft-tissue]
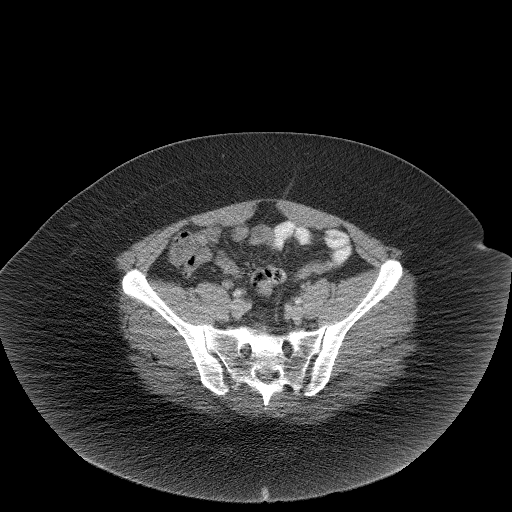
[im 46/103  soft-tissue]
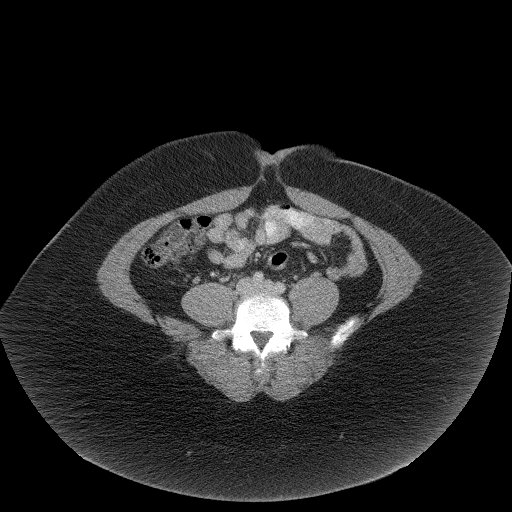
[im 52/103  soft-tissue]
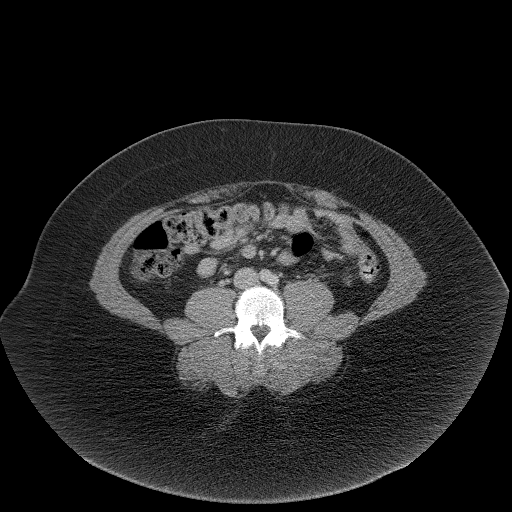
[im 57/103  soft-tissue]
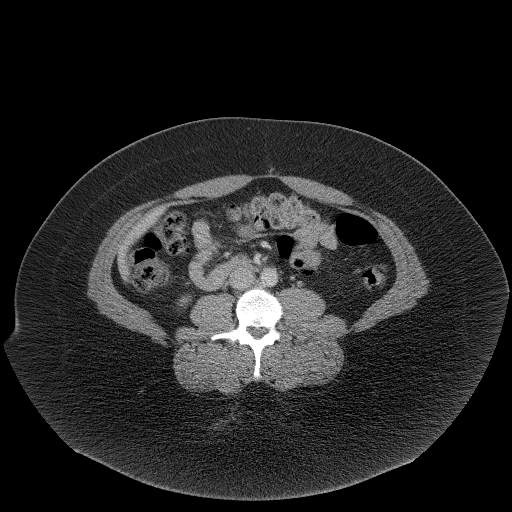
[im 67/103  soft-tissue]
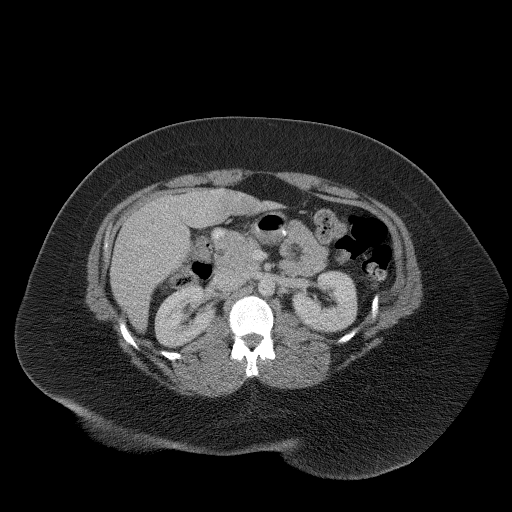
[im 67/103  bone]
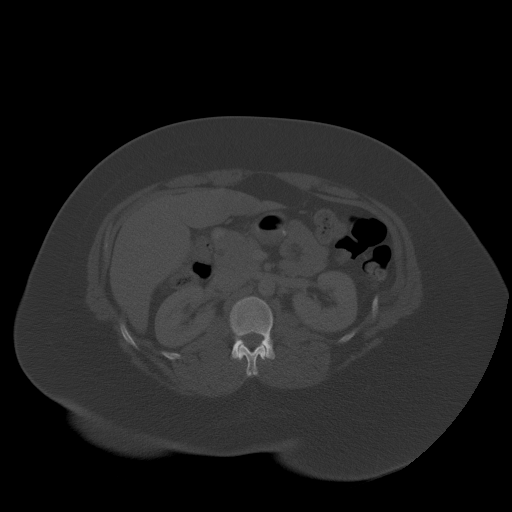
[im 72/103  soft-tissue]
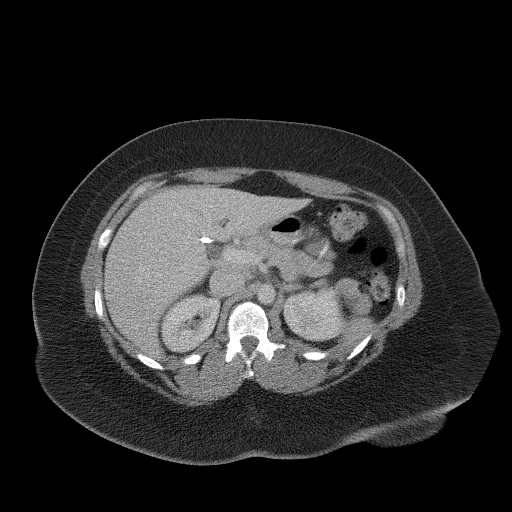
[im 82/103  soft-tissue]
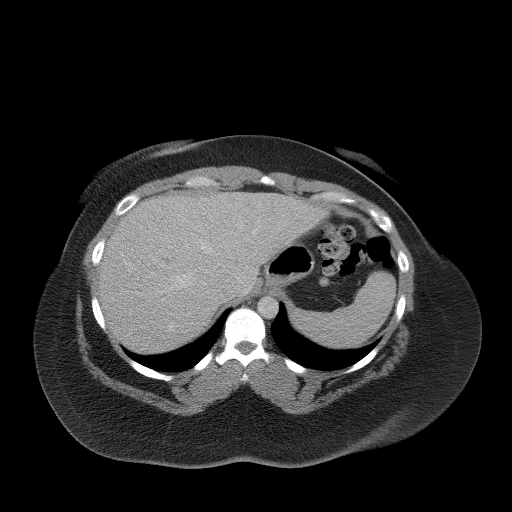
[im 87/103  soft-tissue]
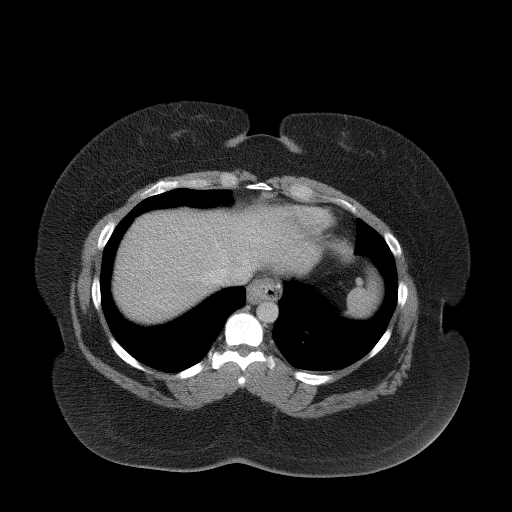
[im 97/103  soft-tissue]
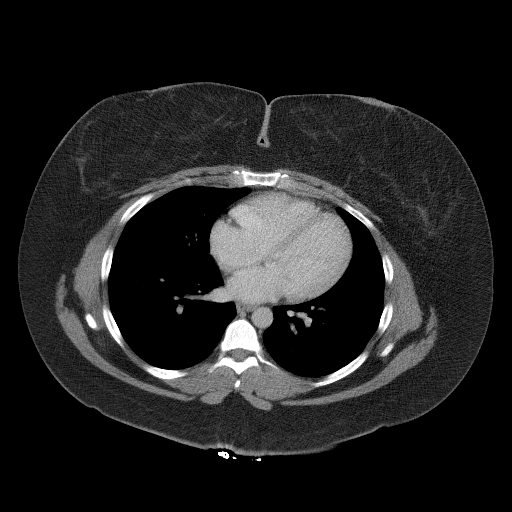

[Series 5: abd/pelvis 3.0 coronal · coronal · 1.01mm/px · 3 of 111 slices shown]
[im 37/111  soft-tissue]
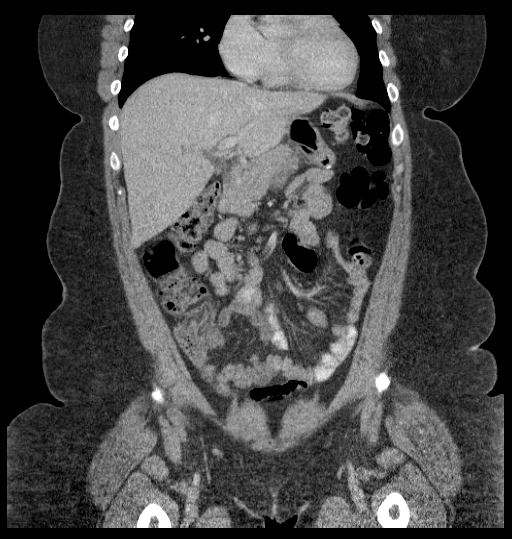
[im 49/111  soft-tissue]
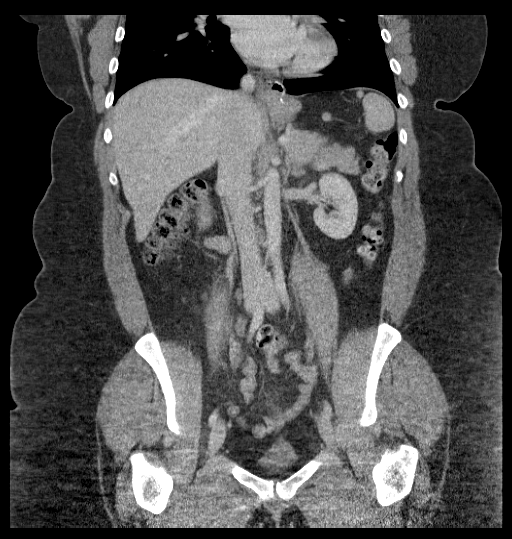
[im 62/111  soft-tissue]
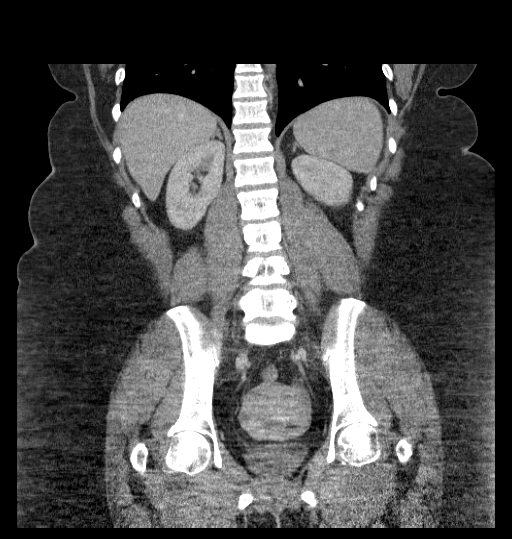

[16 of 46 positions shown; findings below may reference images not displayed]

FINDINGS: The included lung bases are clear.

Clips in the gallbladder fossa from cholecystectomy. There is mild
intra and extrahepatic biliary prominence, a common finding
postcholecystectomy. Common bile duct measures 10 mm at the porta
hepatis. No calcified choledocholithiasis. No focal hepatic lesion.

The pancreas, adrenal glands, spleen are normal. Kidneys demonstrate
symmetric enhancement and excretion without hydronephrosis or
localizing renal abnormality.

Post gastric bypass change. There are no dilated or thickened small
bowel loops. No bowel obstruction or findings to suggest internal
hernia. The appendix is normal. Moderate volume of stool throughout
the colon. No colonic wall thickening or focal colonic abnormality.
No free air, free fluid, or intra-abdominal fluid collection.

No retroperitoneal adenopathy. Abdominal aorta is normal in caliber.
Small fat containing umbilical hernia.

Within the pelvis the bladder is decompressed. Uterus is slightly
bulbous in appearance, may reflect fibroids. No adnexal mass or
pelvic free fluid.

There are no acute or suspicious osseous abnormalities.
IMPRESSION: 1. Postcholecystectomy with mild intra and extrahepatic biliary
ductal dilatation. This is a common finding postcholecystectomy.
Correlation with LFTs recommended.
2. Post gastric bypass without complication.
3. Question uterine fibroids.

## 2015-05-12 MED ORDER — MORPHINE SULFATE 4 MG/ML IJ SOLN
INTRAMUSCULAR | Status: DC
Start: 2015-05-12 — End: 2015-05-12
  Filled 2015-05-12: qty 1

## 2015-05-12 MED ORDER — MORPHINE SULFATE 4 MG/ML IJ SOLN
4.0000 mg | Freq: Once | INTRAMUSCULAR | Status: AC
  Administered 2015-05-12: 4 mg via INTRAVENOUS
  Filled 2015-05-12: qty 1

## 2015-05-12 MED ORDER — IOHEXOL 300 MG/ML  SOLN
100.0000 mL | Freq: Once | INTRAMUSCULAR | Status: AC | PRN
  Administered 2015-05-12: 100 mL via INTRAVENOUS

## 2015-05-12 MED ORDER — SODIUM CHLORIDE 0.9 % IV BOLUS (SEPSIS)
1000.0000 mL | Freq: Once | INTRAVENOUS | Status: AC
  Administered 2015-05-12: 1000 mL via INTRAVENOUS

## 2015-05-12 MED ORDER — ONDANSETRON HCL 4 MG/2ML IJ SOLN
4.0000 mg | Freq: Once | INTRAMUSCULAR | Status: AC
  Administered 2015-05-12: 4 mg via INTRAVENOUS
  Filled 2015-05-12: qty 2

## 2015-05-12 MED ORDER — HYDROCODONE-ACETAMINOPHEN 5-325 MG PO TABS: 1.0000 | ORAL_TABLET | Freq: Four times a day (QID) | ORAL | Status: DC | PRN

## 2015-05-12 MED ORDER — MORPHINE SULFATE 4 MG/ML IJ SOLN
4.0000 mg | Freq: Once | INTRAMUSCULAR | Status: AC
Start: 2015-05-12 — End: 2015-05-12
  Administered 2015-05-12: 4 mg via INTRAVENOUS

## 2015-05-12 NOTE — ED Notes (Signed)
Patient states that she is having pain to her mid epigastric pain like her gallstones for about 6 weeks. Patient states that it is worse tonight and it woke her up

## 2015-05-12 NOTE — Discharge Instructions (Signed)
Hydrocodone as prescribed as needed for pain.  If your symptoms are not improving in the next 2-3 days, follow-up with gastroenterology to discuss a possible endoscopy. The contact information for Dr. Collene Mares has been provided in this discharge summary.   Abdominal Pain Many things can cause abdominal pain. Usually, abdominal pain is not caused by a disease and will improve without treatment. It can often be observed and treated at home. Your health care provider will do a physical exam and possibly order blood tests and X-rays to help determine the seriousness of your pain. However, in many cases, more time must pass before a clear cause of the pain can be found. Before that point, your health care provider may not know if you need more testing or further treatment. HOME CARE INSTRUCTIONS  Monitor your abdominal pain for any changes. The following actions may help to alleviate any discomfort you are experiencing:  Only take over-the-counter or prescription medicines as directed by your health care provider.  Do not take laxatives unless directed to do so by your health care provider.  Try a clear liquid diet (broth, tea, or water) as directed by your health care provider. Slowly move to a bland diet as tolerated. SEEK MEDICAL CARE IF:  You have unexplained abdominal pain.  You have abdominal pain associated with nausea or diarrhea.  You have pain when you urinate or have a bowel movement.  You experience abdominal pain that wakes you in the night.  You have abdominal pain that is worsened or improved by eating food.  You have abdominal pain that is worsened with eating fatty foods.  You have a fever. SEEK IMMEDIATE MEDICAL CARE IF:   Your pain does not go away within 2 hours.  You keep throwing up (vomiting).  Your pain is felt only in portions of the abdomen, such as the right side or the left lower portion of the abdomen.  You pass bloody or black tarry stools. MAKE SURE  YOU:  Understand these instructions.   Will watch your condition.   Will get help right away if you are not doing well or get worse.  Document Released: 09/17/2005 Document Revised: 12/13/2013 Document Reviewed: 08/17/2013 [facility] Patient Information 2015 [place], [place]. This information is not intended to replace advice given to you by your health care provider. Make sure you discuss any questions you have with your health care provider.

## 2015-05-12 NOTE — ED Provider Notes (Signed)
CSN: [id number]     Arrival date & time 05/12/15  0502 History   First MD Initiated Contact with Patient 05/12/15 0510     Chief Complaint  Patient presents with  . Abdominal Pain     (Consider location/radiation/quality/duration/timing/severity/associated sxs/prior Treatment) HPI Comments: Patient is a 39 year old female with history of morbid obesity, prior cholecystectomy, and prior gastric bypass surgery. She presents for evaluation of a 6 week history of epigastric and right upper quadrant abdominal pain. Became worse this evening while she was sleeping. She denies any fevers or chills. She denies any bloody stools, constipation, or diarrhea. She denies any urinary complaints.  Patient is a 39 y.o. female presenting with abdominal pain. The history is provided by the patient.  Abdominal Pain Pain location:  Epigastric and RUQ Pain quality: cramping   Pain radiates to:  Does not radiate Pain severity:  Moderate Onset quality:  Gradual Duration:  6 weeks Timing:  Constant Progression:  Worsening Chronicity:  New Relieved by:  Nothing Worsened by:  Nothing tried Ineffective treatments:  None tried Associated symptoms: nausea   Associated symptoms: no chest pain, no fatigue, no fever and no vomiting     Past Medical History  Diagnosis Date  . GERD (gastroesophageal reflux disease)   . Hypertension    Past Surgical History  Procedure Laterality Date  . Cholecystectomy    . Gastrectomy    . Cesarean section    . Wrist fracture surgery    . Knee surgery    . Gastric bypass     History reviewed. No pertinent family history. History  Substance Use Topics  . Smoking status: Never Smoker   . Smokeless tobacco: Not on file  . Alcohol Use: No   OB History    No data available     Review of Systems  Constitutional: Negative for fever and fatigue.  Cardiovascular: Negative for chest pain.  Gastrointestinal: Positive for nausea and abdominal pain. Negative for vomiting.   All other systems reviewed and are negative.     Allergies  Latex  Home Medications   Prior to Admission medications   Medication Sig Start Date End Date Taking? Authorizing Provider  HYDROcodone-acetaminophen (NORCO) 5-325 MG per tablet Take 1 tablet by mouth every 6 (six) hours as needed for moderate pain. 04/13/15   Melony Overly, MD  OMEPRAZOLE PO Take by mouth.    Historical Provider, MD  ranitidine (ZANTAC) 150 MG tablet Take 1 tablet (150 mg total) by mouth 2 (two) times daily. 07/07/14   [name], MD  sucralfate (CARAFATE) 1 GM/10ML suspension Take 10 mLs (1 g total) by mouth 4 (four) times daily -  with meals and at bedtime. 07/07/14   [name], MD   BP 119/65 mmHg  Pulse 92  Temp(Src) 98.3 F (36.8 C) (Oral)  Resp 16  Ht 5\' 6"  (1.676 m)  Wt 320 lb (145.151 kg)  BMI 51.67 kg/m2  SpO2 100%  LMP 04/10/2015 Physical Exam  Constitutional: She is oriented to person, place, and time. She appears well-developed and well-nourished. No distress.  HENT:  Head: Normocephalic and atraumatic.  Neck: Normal range of motion. Neck supple.  Cardiovascular: Normal rate and regular rhythm.  Exam reveals no gallop and no friction rub.   No murmur heard. Pulmonary/Chest: Effort normal and breath sounds normal. No respiratory distress. She has no wheezes.  Abdominal: Soft. Bowel sounds are normal. She exhibits no distension. There is tenderness. There is no rebound and no  guarding.  There is tenderness to palpation in the epigastric region and right upper quadrant. There is no rebound and no guarding.  Musculoskeletal: Normal range of motion.  Neurological: She is alert and oriented to person, place, and time.  Skin: Skin is warm and dry. She is not diaphoretic.  Nursing note and vitals reviewed.   ED Course  Procedures (including critical care time) Labs Review Labs Reviewed  COMPREHENSIVE METABOLIC PANEL  LIPASE, BLOOD  CBC WITH DIFFERENTIAL/PLATELET   URINALYSIS, ROUTINE W REFLEX MICROSCOPIC  PREGNANCY, URINE    Imaging Review No results found.   EKG Interpretation None      MDM   Final diagnoses:  None    Patient is a 39 year old female who presents with epigastric pain for the past 6 weeks. She is status post cholecystectomy several years ago and she reports this pain feels similar. Today's workup reveals no elevation of white count, normal LFTs and lipase, and CT scan shows dilated biliary ducts which is likely postsurgical, but no other acute pathology. She will be discharged with pain medication and follow-up with gastroenterology if not improving before Monday.    Veryl Speak, MD 05/12/15 [phone]

## 2015-05-12 NOTE — ED Notes (Signed)
Patient standing at the bedside crying in pain

## 2015-06-11 ENCOUNTER — Emergency Department (HOSPITAL_BASED_OUTPATIENT_CLINIC_OR_DEPARTMENT_OTHER)
Admission: EM | Admit: 2015-06-11 | Discharge: 2015-06-11 | Disposition: A | Discharge status: Home / Self Care | Payer: Worker's Compensation | Attending: Emergency Medicine | Admitting: Emergency Medicine

## 2015-06-11 DIAGNOSIS — Z79899 Other long term (current) drug therapy: Secondary | ICD-10-CM | POA: Insufficient documentation

## 2015-06-11 DIAGNOSIS — I10 Essential (primary) hypertension: Secondary | ICD-10-CM | POA: Insufficient documentation

## 2015-06-11 DIAGNOSIS — K219 Gastro-esophageal reflux disease without esophagitis: Secondary | ICD-10-CM | POA: Diagnosis not present

## 2015-06-11 DIAGNOSIS — Y998 Other external cause status: Secondary | ICD-10-CM | POA: Insufficient documentation

## 2015-06-11 DIAGNOSIS — Y9389 Activity, other specified: Secondary | ICD-10-CM | POA: Insufficient documentation

## 2015-06-11 DIAGNOSIS — Y9289 Other specified places as the place of occurrence of the external cause: Secondary | ICD-10-CM | POA: Insufficient documentation

## 2015-06-11 DIAGNOSIS — Z91040 Latex allergy status: Secondary | ICD-10-CM | POA: Insufficient documentation

## 2015-06-11 DIAGNOSIS — Z793 Long term (current) use of hormonal contraceptives: Secondary | ICD-10-CM | POA: Insufficient documentation

## 2015-06-11 DIAGNOSIS — S3992XA Unspecified injury of lower back, initial encounter: Secondary | ICD-10-CM | POA: Diagnosis present

## 2015-06-11 DIAGNOSIS — M545 Low back pain, unspecified: Secondary | ICD-10-CM

## 2015-06-11 DIAGNOSIS — Z9884 Bariatric surgery status: Secondary | ICD-10-CM | POA: Insufficient documentation

## 2015-06-11 DIAGNOSIS — X58XXXA Exposure to other specified factors, initial encounter: Secondary | ICD-10-CM | POA: Insufficient documentation

## 2015-06-11 MED ORDER — KETOROLAC TROMETHAMINE 60 MG/2ML IM SOLN
60.0000 mg | Freq: Once | INTRAMUSCULAR | Status: AC
  Administered 2015-06-11: 60 mg via INTRAMUSCULAR
  Filled 2015-06-11: qty 2

## 2015-06-11 NOTE — ED Notes (Signed)
Pt. Reports she was moving a pt. From w/c to bed and felt a stabbing pain in her lower back.  Today Pt. Reports she feels pain radiating into the R buttock into the R thigh.  Pt. Is able to walk and able to bend legs.  No trouble urinating or pooping.

## 2015-06-11 NOTE — ED Provider Notes (Signed)
CSN: [id number]     Arrival date & time 06/11/15  1616 History   First MD Initiated Contact with Patient 06/11/15 1622     Chief Complaint  Patient presents with  . Back Pain     (Consider location/radiation/quality/duration/timing/severity/associated sxs/prior Treatment) HPI Krista Fisher is a 39 y.o. female history of morbid obesity comes in for evaluation of low back pain. Patient states on Saturday she was trying to move a patient from a wheelchair to a bed when she felt a sudden sharp pain in her lower left back. Discomfort radiates into left buttock and thigh. She has taken Tylenol with some relief. Discomfort is exacerbated with certain movements and is rated as severe. Rest seems to improve symptoms. Denies any numbness, weakness, loss of bowel or bladder function. Denies fevers, chills, decrease in mobility. No other aggravating or modifying factors.  Past Medical History  Diagnosis Date  . GERD (gastroesophageal reflux disease)   . Hypertension    Past Surgical History  Procedure Laterality Date  . Cholecystectomy    . Gastrectomy    . Cesarean section    . Wrist fracture surgery    . Knee surgery    . Gastric bypass     No family history on file. History  Substance Use Topics  . Smoking status: Never Smoker   . Smokeless tobacco: Not on file  . Alcohol Use: No   OB History    No data available     Review of Systems A 10 point review of systems was completed and was negative except for pertinent positives and negatives as mentioned in the history of present illness     Allergies  Latex  Home Medications   Prior to Admission medications   Medication Sig Start Date End Date Taking? Authorizing Provider  buPROPion (WELLBUTRIN XL) 300 MG 24 hr tablet Take 300 mg by mouth daily.   Yes Historical Provider, MD  levonorgestrel-ethinyl estradiol (ENPRESSE,TRIVORA) tablet Take 1 tablet by mouth daily.   Yes Historical Provider, MD  sertraline (ZOLOFT) 25 MG tablet  Take 25 mg by mouth daily.   Yes Historical Provider, MD  HYDROcodone-acetaminophen (NORCO) 5-325 MG per tablet Take 1-2 tablets by mouth every 6 (six) hours as needed. 05/12/15   Veryl Speak, MD  OMEPRAZOLE PO Take by mouth.    Historical Provider, MD  ranitidine (ZANTAC) 150 MG tablet Take 1 tablet (150 mg total) by mouth 2 (two) times daily. 07/07/14   [name], MD  sucralfate (CARAFATE) 1 GM/10ML suspension Take 10 mLs (1 g total) by mouth 4 (four) times daily -  with meals and at bedtime. 07/07/14   [name], MD   BP 132/68 mmHg  Pulse 75  Temp(Src) 98.4 F (36.9 C) (Oral)  Resp 18  Ht 5\' 6"  (1.676 m)  Wt 320 lb (145.151 kg)  BMI 51.67 kg/m2  SpO2 93%  LMP 06/04/2015 Physical Exam  Constitutional: She is oriented to person, place, and time. She appears well-developed and well-nourished.  obese  HENT:  Head: Normocephalic and atraumatic.  Mouth/Throat: Oropharynx is clear and moist.  Eyes: Conjunctivae are normal. Pupils are equal, round, and reactive to light. Right eye exhibits no discharge. Left eye exhibits no discharge. No scleral icterus.  Neck: Neck supple.  Cardiovascular: Normal rate, regular rhythm and normal heart sounds.   Pulmonary/Chest: Effort normal and breath sounds normal. No respiratory distress. She has no wheezes. She has no rales.  Abdominal: Soft. There is no tenderness.  Musculoskeletal: She exhibits no tenderness.  Diffuse tenderness throughout left lumbar paraspinal muscles without any midline bony tenderness. No obvious lesions or deformities. No erythema or swelling. No bony step-offs or crepitus. Negative straight leg raise.  Neurological: She is alert and oriented to person, place, and time.  Cranial Nerves II-XII grossly intact. Motor and sensation 5/5 in all 4 extremities.  Skin: Skin is warm and dry. No rash noted.  Psychiatric: She has a normal mood and affect.  Nursing note and vitals reviewed.   ED Course   Procedures (including critical care time) Labs Review Labs Reviewed - No data to display  Imaging Review No results found.   EKG Interpretation None     Meds given in ED:  Medications  ketorolac (TORADOL) injection 60 mg (not administered)    New Prescriptions   No medications on file   Filed Vitals:   06/11/15 1619  BP: 132/68  Pulse: 75  Temp: 98.4 F (36.9 C)  TempSrc: Oral  Resp: 18  Height: 5\' 6"  (1.676 m)  Weight: 320 lb (145.151 kg)  SpO2: 93%    MDM  Vitals stable - WNL -afebrile Pt resting comfortably in ED. PE--physical exam consistent with lumbosacral sprain versus HNP. No evidence of acute spinal cord pathology. Patient reports history of gastric bypass surgery and is unable to take oral NSAIDs. Given IM Toradol in the ED and encouraged to continue taking Tylenol at home, using warm compresses, stretching improved symptoms. Encouraged to follow up with primary care for further evaluation and management of symptoms. No evidence of other acute or emergent pathology at this time.  I discussed all relevant lab findings and imaging results with pt and they verbalized understanding. Discussed f/u with PCP within 48 hrs and return precautions, pt very amenable to plan.  Final diagnoses:  Left-sided low back pain without sciatica        Comer Locket, PA-C 06/11/15 [place], MD 06/11/15 2355

## 2015-06-11 NOTE — Discharge Instructions (Signed)
Back Injury Prevention °Back injuries can be extremely painful and difficult to heal. After having one back injury, you are much more likely to experience another later on. It is important to learn how to avoid injuring or re-injuring your back. The following tips can help you to prevent a back injury. °PHYSICAL FITNESS °· Exercise regularly and try to develop good tone in your abdominal muscles. Your abdominal muscles provide a lot of the support needed by your back. °· Do aerobic exercises (walking, jogging, biking, swimming) regularly. °· Do exercises that increase balance and strength (tai chi, yoga) regularly. This can decrease your risk of falling and injuring your back. °· Stretch before and after exercising. °· Maintain a healthy weight. The more you weigh, the more stress is placed on your back. For every pound of weight, 10 times that amount of pressure is placed on the back. °DIET °· Talk to your caregiver about how much calcium and vitamin D you need per day. These nutrients help to prevent weakening of the bones (osteoporosis). Osteoporosis can cause broken (fractured) bones that lead to back pain. °· Include good sources of calcium in your diet, such as dairy products, green, leafy vegetables, and products with calcium added (fortified). °· Include good sources of vitamin D in your diet, such as milk and foods that are fortified with vitamin D. °· Consider taking a nutritional supplement or a multivitamin if needed. °· Stop smoking if you smoke. °POSTURE °· Sit and stand up straight. Avoid leaning forward when you sit or hunching over when you stand. °· Choose chairs with good low back (lumbar) support. °· If you work at a desk, sit close to your work so you do not need to lean over. Keep your chin tucked in. Keep your neck drawn back and elbows bent at a right angle. Your arms should look like the letter "L." °· Sit high and close to the steering wheel when you drive. Add a lumbar support to your car  seat if needed. °· Avoid sitting or standing in one position for too long. Take breaks to get up, stretch, and walk around at least once every hour. Take breaks if you are driving for long periods of time. °· Sleep on your side with your knees slightly bent, or sleep on your back with a pillow under your knees. Do not sleep on your stomach. °LIFTING, TWISTING, AND REACHING °· Avoid heavy lifting, especially repetitive lifting. If you must do heavy lifting: °· Stretch before lifting. °· Work slowly. °· Rest between lifts. °· Use carts and dollies to move objects when possible. °· Make several small trips instead of carrying 1 heavy load. °· Ask for help when you need it. °· Ask for help when moving big, awkward objects. °· Follow these steps when lifting: °· Stand with your feet shoulder-width apart. °· Get as close to the object as you can. Do not try to pick up heavy objects that are far from your body. °· Use handles or lifting straps if they are available. °· Bend at your knees. Squat down, but keep your heels off the floor. °· Keep your shoulders pulled back, your chin tucked in, and your back straight. °· Lift the object slowly, tightening the muscles in your legs, abdomen, and buttocks. Keep the object as close to the center of your body as possible. °· When you put a load down, use these same guidelines in reverse. °· Do not: °· Lift the object above your waist. °·   Twist at the waist while lifting or carrying a load. Move your feet if you need to turn, not your waist.  Bend over without bending at your knees.  Avoid reaching over your head, across a table, or for an object on a high surface. OTHER TIPS  Avoid wet floors and keep sidewalks clear of ice to prevent falls.  Do not sleep on a mattress that is too soft or too hard.  Keep items that are used frequently within easy reach.  Put heavier objects on shelves at waist level and lighter objects on lower or higher shelves.  Find ways to  decrease your stress, such as exercise, massage, or relaxation techniques. Stress can build up in your muscles. Tense muscles are more vulnerable to injury.  Seek treatment for depression or anxiety if needed. These conditions can increase your risk of developing back pain. SEEK MEDICAL CARE IF:  You injure your back.  You have questions about diet, exercise, or other ways to prevent back injuries. MAKE SURE YOU:  Understand these instructions.  Will watch your condition.  Will get help right away if you are not doing well or get worse. Document Released: 01/15/2005 Document Revised: 03/01/2012 Document Reviewed: 01/19/2012 Exit[employer] Patient Information 2015 [employer]. This information is not intended to replace advice given to you by your health care provider. Make sure you discuss any questions you have with your health care provider.  Back Pain, Adult Low back pain is very common. About 1 in 5 people have back pain.The cause of low back pain is rarely dangerous. The pain often gets better over time.About half of people with a sudden onset of back pain feel better in just 2 weeks. About 8 in 10 people feel better by 6 weeks.  CAUSES Some common causes of back pain include:  Strain of the muscles or ligaments supporting the spine.  Wear and tear (degeneration) of the spinal discs.  Arthritis.  Direct injury to the back. DIAGNOSIS Most of the time, the direct cause of low back pain is not known.However, back pain can be treated effectively even when the exact cause of the pain is unknown.Answering your caregiver's questions about your overall health and symptoms is one of the most accurate ways to make sure the cause of your pain is not dangerous. If your caregiver needs more information, he or she may order lab work or imaging tests (X-rays or MRIs).However, even if imaging tests show changes in your back, this usually does not require surgery. HOME CARE INSTRUCTIONS For  many people, back pain returns.Since low back pain is rarely dangerous, it is often a condition that people can learn to manageon their own.   Remain active. It is stressful on the back to sit or stand in one place. Do not sit, drive, or stand in one place for more than 30 minutes at a time. Take short walks on level surfaces as soon as pain allows.Try to increase the length of time you walk each day.  Do not stay in bed.Resting more than 1 or 2 days can delay your recovery.  Do not avoid exercise or work.Your body is made to move.It is not dangerous to be active, even though your back may hurt.Your back will likely heal faster if you return to being active before your pain is gone.  Pay attention to your body when you bend and lift. Many people have less discomfortwhen lifting if they bend their knees, keep the load close to their bodies,and   avoid twisting. Often, the most comfortable positions are those that put less stress on your recovering back.  Find a comfortable position to sleep. Use a firm mattress and lie on your side with your knees slightly bent. If you lie on your back, put a pillow under your knees.  Only take over-the-counter or prescription medicines as directed by your caregiver. Over-the-counter medicines to reduce pain and inflammation are often the most helpful.Your caregiver may prescribe muscle relaxant drugs.These medicines help dull your pain so you can more quickly return to your normal activities and healthy exercise.  Put ice on the injured area.  Put ice in a plastic bag.  Place a towel between your skin and the bag.  Leave the ice on for 15-20 minutes, 03-04 times a day for the first 2 to 3 days. After that, ice and heat may be alternated to reduce pain and spasms.  Ask your caregiver about trying back exercises and gentle massage. This may be of some benefit.  Avoid feeling anxious or stressed.Stress increases muscle tension and can worsen back  pain.It is important to recognize when you are anxious or stressed and learn ways to manage it.Exercise is a great option. SEEK MEDICAL CARE IF:  You have pain that is not relieved with rest or medicine.  You have pain that does not improve in 1 week.  You have new symptoms.  You are generally not feeling well. SEEK IMMEDIATE MEDICAL CARE IF:   You have pain that radiates from your back into your legs.  You develop new bowel or bladder control problems.  You have unusual weakness or numbness in your arms or legs.  You develop nausea or vomiting.  You develop abdominal pain.  You feel faint. Document Released: 12/08/2005 Document Revised: 06/08/2012 Document Reviewed: 04/11/2014 [employer] Patient Information 2015 [employer]. This information is not intended to replace advice given to you by your health care provider. Make sure you discuss any questions you have with your health care provider.   

## 2015-07-16 ENCOUNTER — Emergency Department (INDEPENDENT_AMBULATORY_CARE_PROVIDER_SITE_OTHER)
Admission: EM | Admit: 2015-07-16 | Discharge: 2015-07-16 | Disposition: A | Discharge status: Home / Self Care | Payer: No Typology Code available for payment source | Source: Home / Self Care | Attending: Family Medicine | Admitting: Family Medicine

## 2015-07-16 ENCOUNTER — Encounter (HOSPITAL_COMMUNITY): Payer: Self-pay | Admitting: Emergency Medicine

## 2015-07-16 DIAGNOSIS — N390 Urinary tract infection, site not specified: Secondary | ICD-10-CM

## 2015-07-16 LAB — POCT URINALYSIS DIP (DEVICE)
Bilirubin Urine: NEGATIVE
Glucose, UA: NEGATIVE mg/dL
Hgb urine dipstick: NEGATIVE
Ketones, ur: NEGATIVE mg/dL
Leukocytes, UA: NEGATIVE
Nitrite: NEGATIVE
Protein, ur: NEGATIVE mg/dL
Specific Gravity, Urine: 1.025 (ref 1.005–1.030)
Urobilinogen, UA: 4 mg/dL — ABNORMAL HIGH (ref 0.0–1.0)
pH: 6 (ref 5.0–8.0)

## 2015-07-16 LAB — POCT PREGNANCY, URINE: Preg Test, Ur: NEGATIVE

## 2015-07-16 MED ORDER — CEPHALEXIN 500 MG PO CAPS: 500.0000 mg | ORAL_CAPSULE | Freq: Four times a day (QID) | ORAL | Status: DC

## 2015-07-16 NOTE — ED Notes (Signed)
C/o urgency, frequency, burning and dark color to urine.  Patient has had symptoms for 2-3 weeks.  Patient denies abdominal pain, denies back pain.

## 2015-07-16 NOTE — ED Notes (Signed)
uti concerns.

## 2015-07-16 NOTE — ED Provider Notes (Signed)
CSN: [id number]     Arrival date & time 07/16/15  1300 History   First MD Initiated Contact with Patient 07/16/15 1309     Chief Complaint  Patient presents with  . Urinary Tract Infection   (Consider location/radiation/quality/duration/timing/severity/associated sxs/prior Treatment) Patient is a 39 y.o. female presenting with urinary tract infection. The history is provided by the patient.  Urinary Tract Infection Pain quality:  Burning Pain severity:  Mild Onset quality:  Gradual Duration:  2 weeks Progression:  Unchanged Chronicity:  New Recent urinary tract infections: no   Relieved by:  None tried Worsened by:  Nothing tried Ineffective treatments:  None tried Urinary symptoms: discolored urine and frequent urination   Associated symptoms: no abdominal pain, no fever, no flank pain, no nausea, no vaginal discharge and no vomiting     Past Medical History  Diagnosis Date  . GERD (gastroesophageal reflux disease)   . Hypertension    Past Surgical History  Procedure Laterality Date  . Cholecystectomy    . Gastrectomy    . Cesarean section    . Wrist fracture surgery    . Knee surgery    . Gastric bypass     No family history on file. History  Substance Use Topics  . Smoking status: Never Smoker   . Smokeless tobacco: Not on file  . Alcohol Use: No   OB History    No data available     Review of Systems  Constitutional: Negative.  Negative for fever.  Gastrointestinal: Negative for nausea, vomiting and abdominal pain.  Genitourinary: Positive for dysuria, urgency and frequency. Negative for flank pain, vaginal discharge, menstrual problem and pelvic pain.    Allergies  Latex  Home Medications   Prior to Admission medications   Medication Sig Start Date End Date Taking? Authorizing Provider  buPROPion (WELLBUTRIN XL) 300 MG 24 hr tablet Take 300 mg by mouth daily.    Historical Provider, MD  cephALEXin (KEFLEX) 500 MG capsule Take 1 capsule (500 mg  total) by mouth 4 (four) times daily. Take all of medicine and drink lots of fluids 07/16/15   Billy Fischer, MD  HYDROcodone-acetaminophen [facility]) 5-325 MG per tablet Take 1-2 tablets by mouth every 6 (six) hours as needed. 05/12/15   Veryl Speak, MD  levonorgestrel-ethinyl estradiol Glory Rosebush) tablet Take 1 tablet by mouth daily.    Historical Provider, MD  OMEPRAZOLE PO Take by mouth.    Historical Provider, MD  ranitidine (ZANTAC) 150 MG tablet Take 1 tablet (150 mg total) by mouth 2 (two) times daily. 07/07/14   [name], MD  sertraline (ZOLOFT) 25 MG tablet Take 25 mg by mouth daily.    Historical Provider, MD  sucralfate (CARAFATE) 1 GM/10ML suspension Take 10 mLs (1 g total) by mouth 4 (four) times daily -  with meals and at bedtime. 07/07/14   [name], MD   BP 126/76 mmHg  Pulse 106  Temp(Src) 99.2 F (37.3 C) (Oral)  Resp 16  SpO2 96%  LMP 07/02/2015 Physical Exam  Constitutional: She is oriented to person, place, and time. She appears well-developed and well-nourished.  Neck: Normal range of motion. Neck supple.  Abdominal: Soft. Bowel sounds are normal. She exhibits no mass. There is no tenderness. There is no rebound and no guarding.  Lymphadenopathy:    She has no cervical adenopathy.  Neurological: She is alert and oriented to person, place, and time.  Skin: Skin is warm and dry.  Nursing note and  vitals reviewed.   ED Course  Procedures (including critical care time) Labs Review Labs Reviewed  POCT URINALYSIS DIP (DEVICE) - Abnormal; Notable for the following:    Urobilinogen, UA 4.0 (*)    All other components within normal limits  POCT PREGNANCY, URINE   U/a neg. Imaging Review No results found.   MDM   1. UTI (lower urinary tract infection)    Based on sx will rx as uti.    Billy Fischer, MD 07/16/15 1329

## 2015-07-16 NOTE — Discharge Instructions (Signed)
Take all of medicine as directed, drink lots of fluids, see your doctor if further problems. °

## 2015-11-09 ENCOUNTER — Encounter (HOSPITAL_BASED_OUTPATIENT_CLINIC_OR_DEPARTMENT_OTHER): Payer: Self-pay | Admitting: Emergency Medicine

## 2015-11-09 ENCOUNTER — Emergency Department (HOSPITAL_BASED_OUTPATIENT_CLINIC_OR_DEPARTMENT_OTHER)
Admission: EM | Admit: 2015-11-09 | Discharge: 2015-11-09 | Disposition: A | Discharge status: Home / Self Care | Payer: Self-pay | Attending: Emergency Medicine | Admitting: Emergency Medicine

## 2015-11-09 DIAGNOSIS — Z792 Long term (current) use of antibiotics: Secondary | ICD-10-CM | POA: Insufficient documentation

## 2015-11-09 DIAGNOSIS — M5412 Radiculopathy, cervical region: Secondary | ICD-10-CM | POA: Insufficient documentation

## 2015-11-09 DIAGNOSIS — Z793 Long term (current) use of hormonal contraceptives: Secondary | ICD-10-CM | POA: Insufficient documentation

## 2015-11-09 DIAGNOSIS — M546 Pain in thoracic spine: Secondary | ICD-10-CM | POA: Insufficient documentation

## 2015-11-09 DIAGNOSIS — I10 Essential (primary) hypertension: Secondary | ICD-10-CM | POA: Insufficient documentation

## 2015-11-09 DIAGNOSIS — K219 Gastro-esophageal reflux disease without esophagitis: Secondary | ICD-10-CM | POA: Insufficient documentation

## 2015-11-09 DIAGNOSIS — Z79899 Other long term (current) drug therapy: Secondary | ICD-10-CM | POA: Insufficient documentation

## 2015-11-09 DIAGNOSIS — Z91040 Latex allergy status: Secondary | ICD-10-CM | POA: Insufficient documentation

## 2015-11-09 MED ORDER — HYDROCODONE-ACETAMINOPHEN 5-325 MG PO TABS: 1.0000 | ORAL_TABLET | Freq: Four times a day (QID) | ORAL | Status: DC | PRN

## 2015-11-09 NOTE — Discharge Instructions (Signed)

## 2015-11-09 NOTE — ED Notes (Signed)
Patient states that she has had lower back pain x 3 days. The patient reports that she has been taking some older flexeril and it is not helping

## 2015-11-09 NOTE — ED Provider Notes (Signed)
CSN: [id number]     Arrival date & time 11/09/15  [id number] History   First MD Initiated Contact with Patient 11/09/15 0539     Chief Complaint  Patient presents with  . Back Pain     (Consider location/radiation/quality/duration/timing/severity/associated sxs/prior Treatment) HPI  This is a 39 year old female with a three-day history of pain in her upper back medial to her right scapula. Pain is exacerbated by rotation of her neck which also causes pain in the right side of the neck. This pain radiates to her right shoulder. She rates her pain as an 8 out of 10, worse with movement of the neck is noted. 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 There is no associated numbness or weakness. She states the pain waxes and wanes and feels like spasms at times. She has taken Flexeril without relief.  Past Medical History  Diagnosis Date  . GERD (gastroesophageal reflux disease)   . Hypertension    Past Surgical History  Procedure Laterality Date  . Cholecystectomy    . Gastrectomy    . Cesarean section    . Wrist fracture surgery    . Knee surgery    . Gastric bypass     History reviewed. No pertinent family history. Social History  Substance Use Topics  . Smoking status: Never Smoker   . Smokeless tobacco: None  . Alcohol Use: No   OB History    No data available     Review of Systems  All other systems reviewed and are negative.   Allergies  Latex  Home Medications   Prior to Admission medications   Medication Sig Start Date End Date Taking? Authorizing Provider  buPROPion (WELLBUTRIN XL) 300 MG 24 hr tablet Take 300 mg by mouth daily.    Historical Provider, MD  cephALEXin (KEFLEX) 500 MG capsule Take 1 capsule (500 mg total) by mouth 4 (four) times daily. Take all of medicine and drink lots of fluids 07/16/15   Billy Fischer, MD  HYDROcodone-acetaminophen [facility]) 5-325 MG per tablet Take 1-2 tablets by mouth every 6 (six) hours as needed. 05/12/15    Veryl Speak, MD  levonorgestrel-ethinyl estradiol Glory Rosebush) tablet Take 1 tablet by mouth daily.    Historical Provider, MD  OMEPRAZOLE PO Take by mouth.    Historical Provider, MD  ranitidine (ZANTAC) 150 MG tablet Take 1 tablet (150 mg total) by mouth 2 (two) times daily. 07/07/14   [name], MD  sertraline (ZOLOFT) 25 MG tablet Take 25 mg by mouth daily.    Historical Provider, MD  sucralfate (CARAFATE) 1 GM/10ML suspension Take 10 mLs (1 g total) by mouth 4 (four) times daily -  with meals and at bedtime. 07/07/14   [name], MD   BP 123/69 mmHg  Pulse 88  Temp(Src) 98.8 F (37.1 C)  SpO2 93%   Physical Exam  General: Well-developed, obese female in no acute distress; appearance consistent with age of record HENT: normocephalic; atraumatic Eyes: pupils equal, round and reactive to light; extraocular muscles intact Neck: supple; right soft tissue tenderness; rotation of the head to the right or left reproduces pain in the right side of the neck radiating to the right shoulder and right upper back Heart: regular rate and rhythm; Lungs: clear to auscultation bilaterally Abdomen: soft; nondistended Back: No spinal tenderness; mild tenderness of musculature medial to right scapula Extremities: No deformity; full range of motion Neurologic: Awake, alert and oriented; motor function intact in  all extremities and symmetric; no facial droop Skin: Warm and dry Psychiatric: Normal mood and affect    ED Course  Procedures (including critical care time)   MDM  Patient's history and exam are consistent with cervical radiculopathy.    Shanon Rosser, MD 11/09/15 [phone]

## 2015-11-11 ENCOUNTER — Emergency Department (HOSPITAL_COMMUNITY): Payer: Self-pay

## 2015-11-11 ENCOUNTER — Emergency Department (HOSPITAL_COMMUNITY)
Admission: EM | Admit: 2015-11-11 | Discharge: 2015-11-11 | Disposition: A | Discharge status: Home / Self Care | Payer: Self-pay | Attending: Emergency Medicine | Admitting: Emergency Medicine

## 2015-11-11 ENCOUNTER — Encounter (HOSPITAL_COMMUNITY): Payer: Self-pay | Admitting: Emergency Medicine

## 2015-11-11 DIAGNOSIS — M5412 Radiculopathy, cervical region: Secondary | ICD-10-CM

## 2015-11-11 DIAGNOSIS — K219 Gastro-esophageal reflux disease without esophagitis: Secondary | ICD-10-CM | POA: Insufficient documentation

## 2015-11-11 DIAGNOSIS — M5030 Other cervical disc degeneration, unspecified cervical region: Secondary | ICD-10-CM

## 2015-11-11 DIAGNOSIS — Z91040 Latex allergy status: Secondary | ICD-10-CM | POA: Insufficient documentation

## 2015-11-11 DIAGNOSIS — Z793 Long term (current) use of hormonal contraceptives: Secondary | ICD-10-CM | POA: Insufficient documentation

## 2015-11-11 DIAGNOSIS — M5010 Cervical disc disorder with radiculopathy, unspecified cervical region: Secondary | ICD-10-CM | POA: Insufficient documentation

## 2015-11-11 DIAGNOSIS — I10 Essential (primary) hypertension: Secondary | ICD-10-CM | POA: Insufficient documentation

## 2015-11-11 DIAGNOSIS — Z79899 Other long term (current) drug therapy: Secondary | ICD-10-CM | POA: Insufficient documentation

## 2015-11-11 IMAGING — CR DG CERVICAL SPINE COMPLETE 4+V
5 series · 5 of 5 positions shown · non-contrast
Comparison: None.

CLINICAL DATA: Neck pain for 2 weeks, more to the right than the
left.

EXAM:
CERVICAL SPINE - COMPLETE 4+ VIEW

[w cervical spine lat]
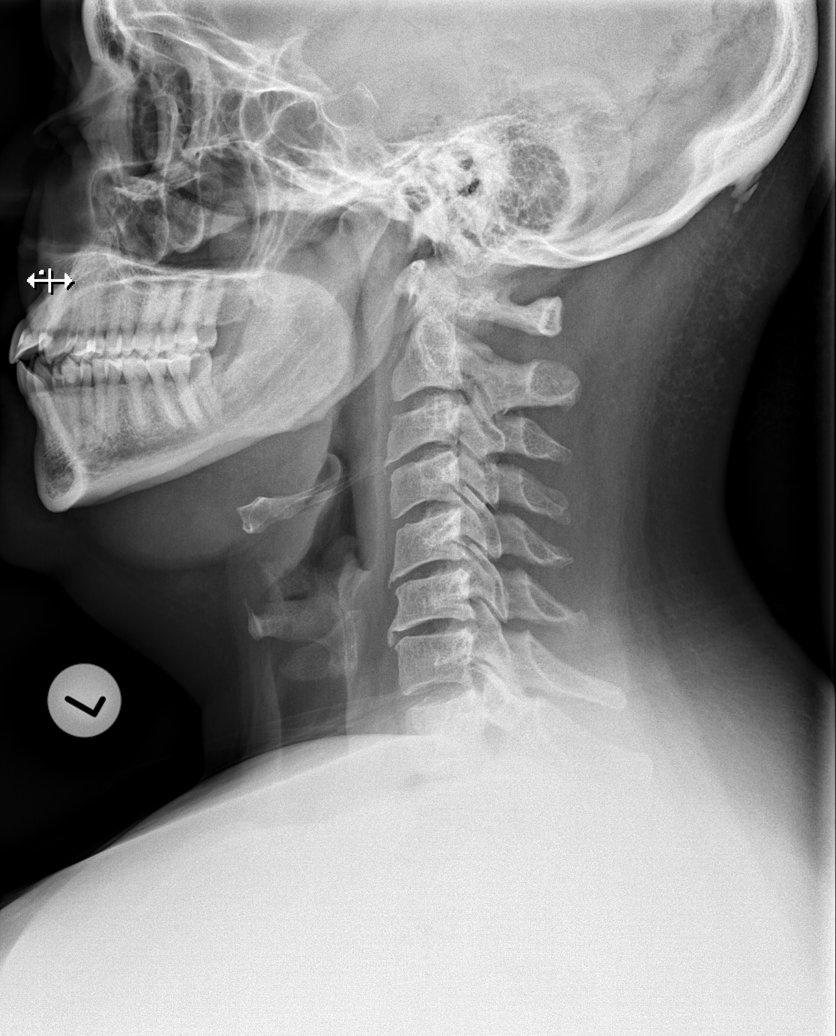

[w cervical spine ap_obl (1 of 2)]
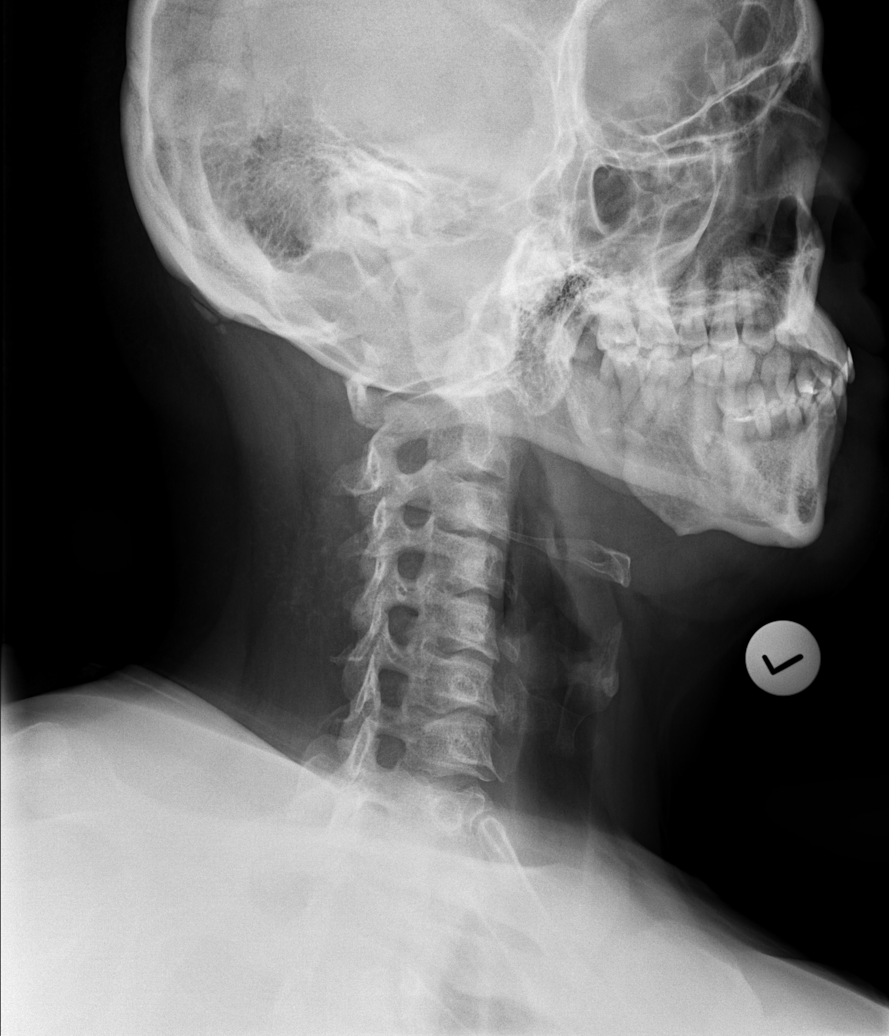

[w cervical spine ap_obl (2 of 2)]
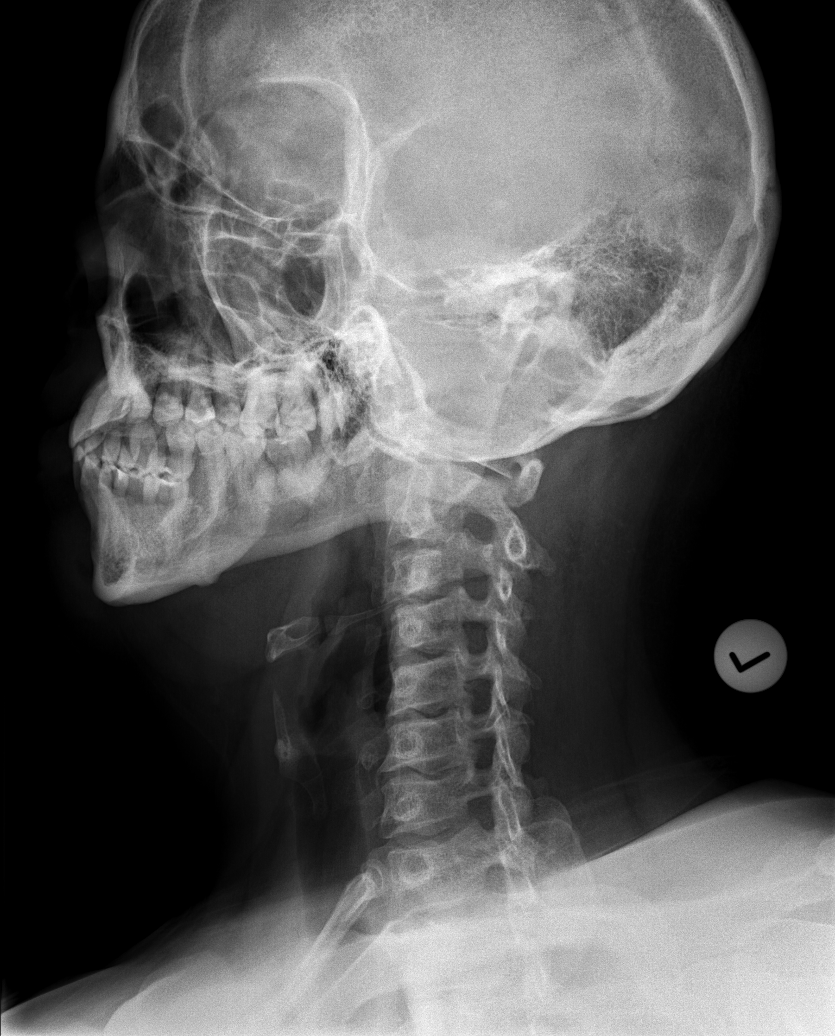

[w cervical spine ap]
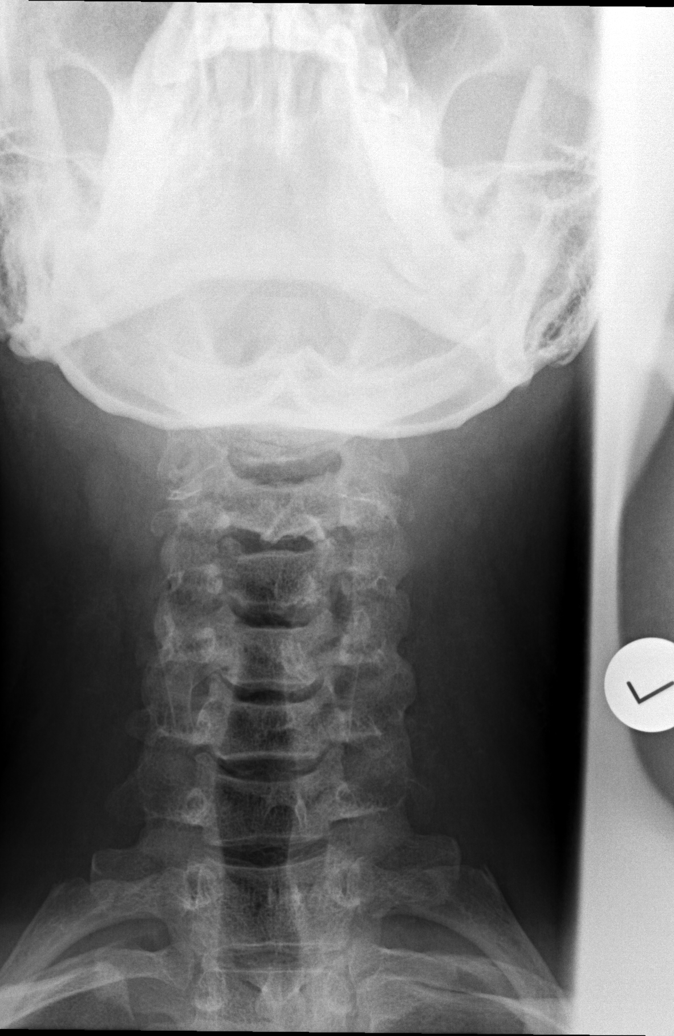

[w cervical spine odontoid]
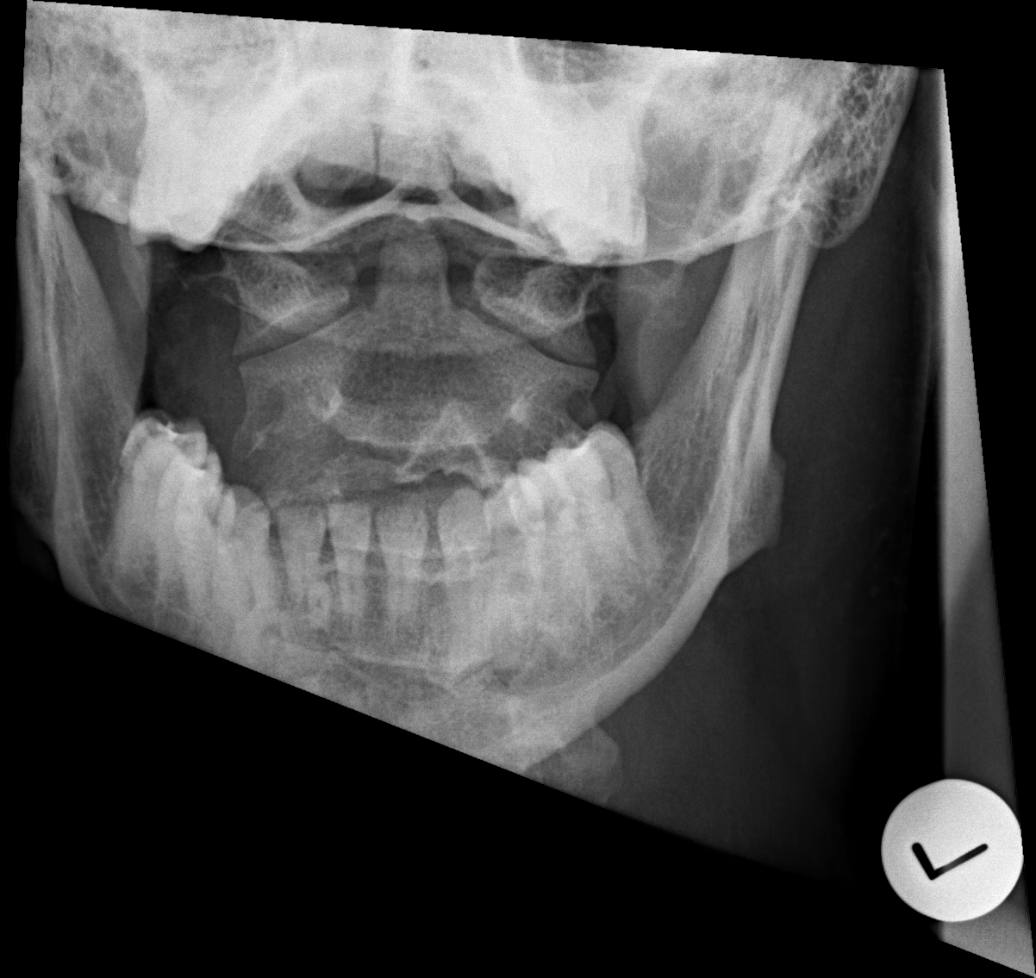

[5 of 5 positions shown; findings below may reference images not displayed]

FINDINGS: There is no fracture or subluxation or prevertebral soft tissue
swelling. Slight diffuse narrowing of the disc spaces from C3-4
through C6-7. No foraminal stenosis or facet arthritis.
IMPRESSION: Multilevel slight degenerative disc disease.

## 2015-11-11 IMAGING — CR DG THORACIC SPINE 2V
3 series · 3 of 3 positions shown · non-contrast
Comparison: None.

CLINICAL DATA: Patient with right greater than left neck and upper
back pain for 2 weeks. No known injury. Initial encounter.

EXAM:
THORACIC SPINE 2 VIEWS

[t thoracic spine ap]
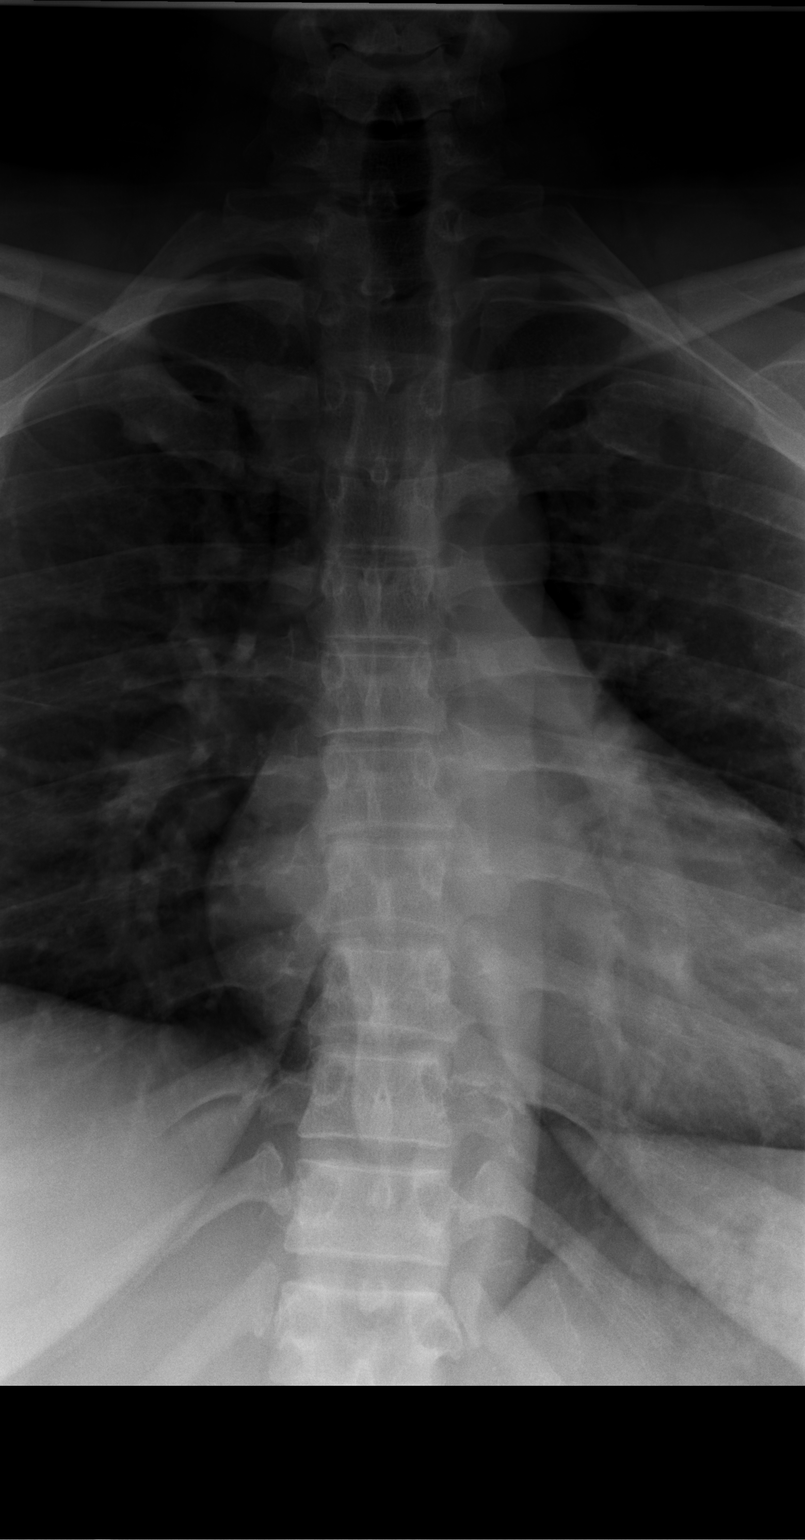

[t thoracic spine lat]
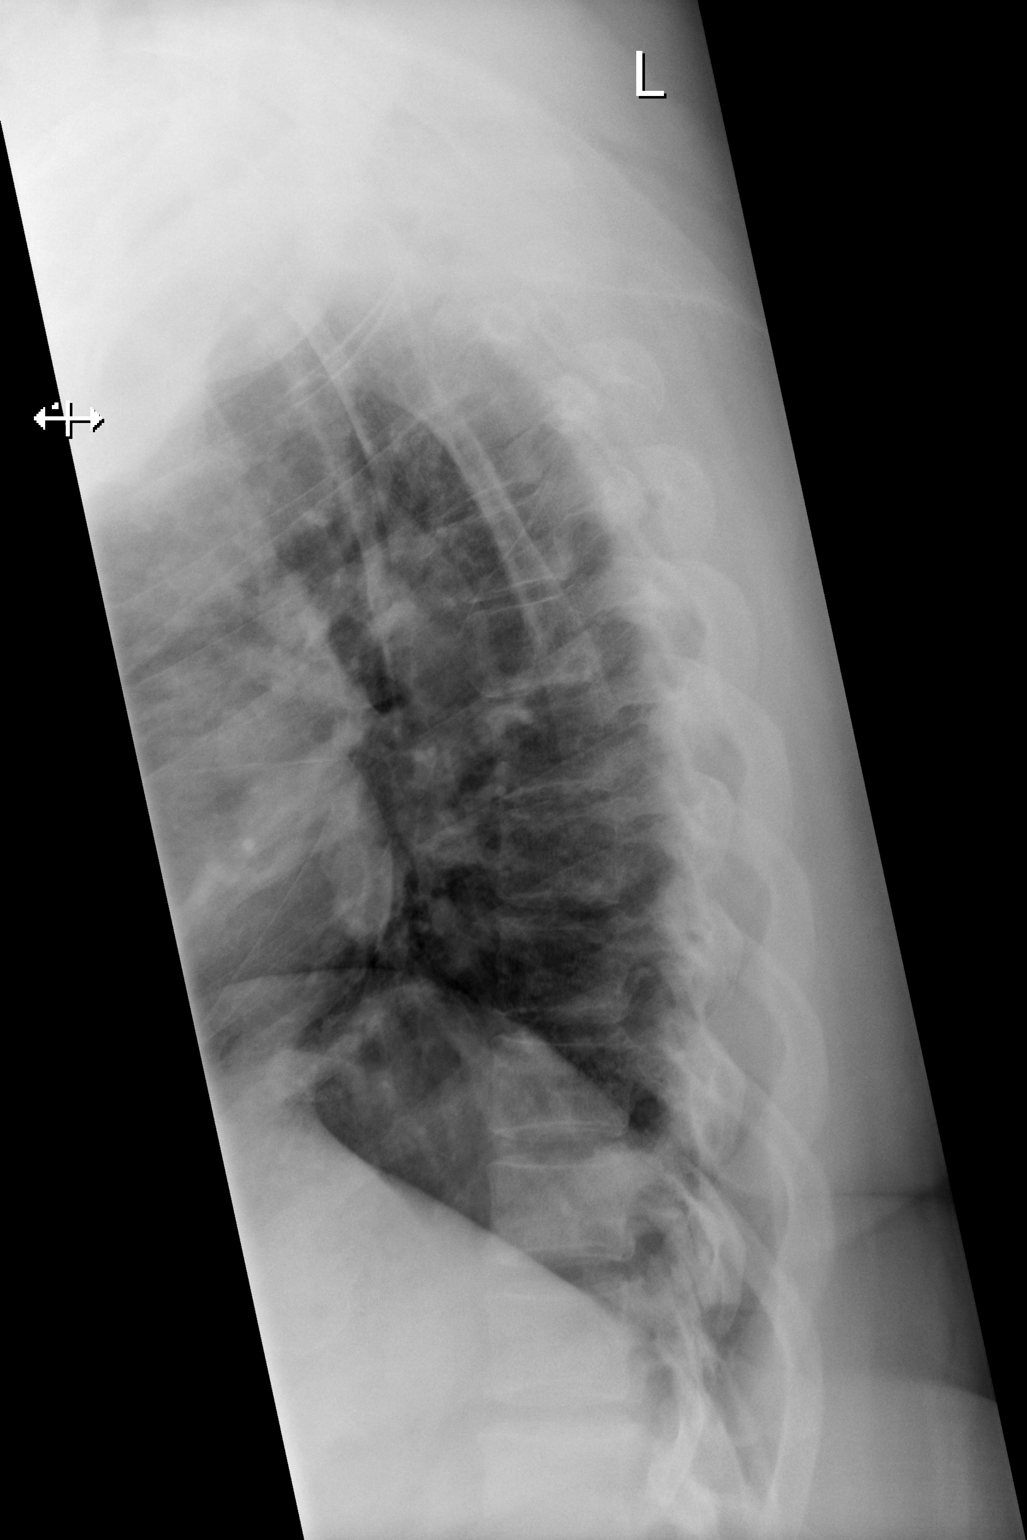

[t thoracic swimmers]
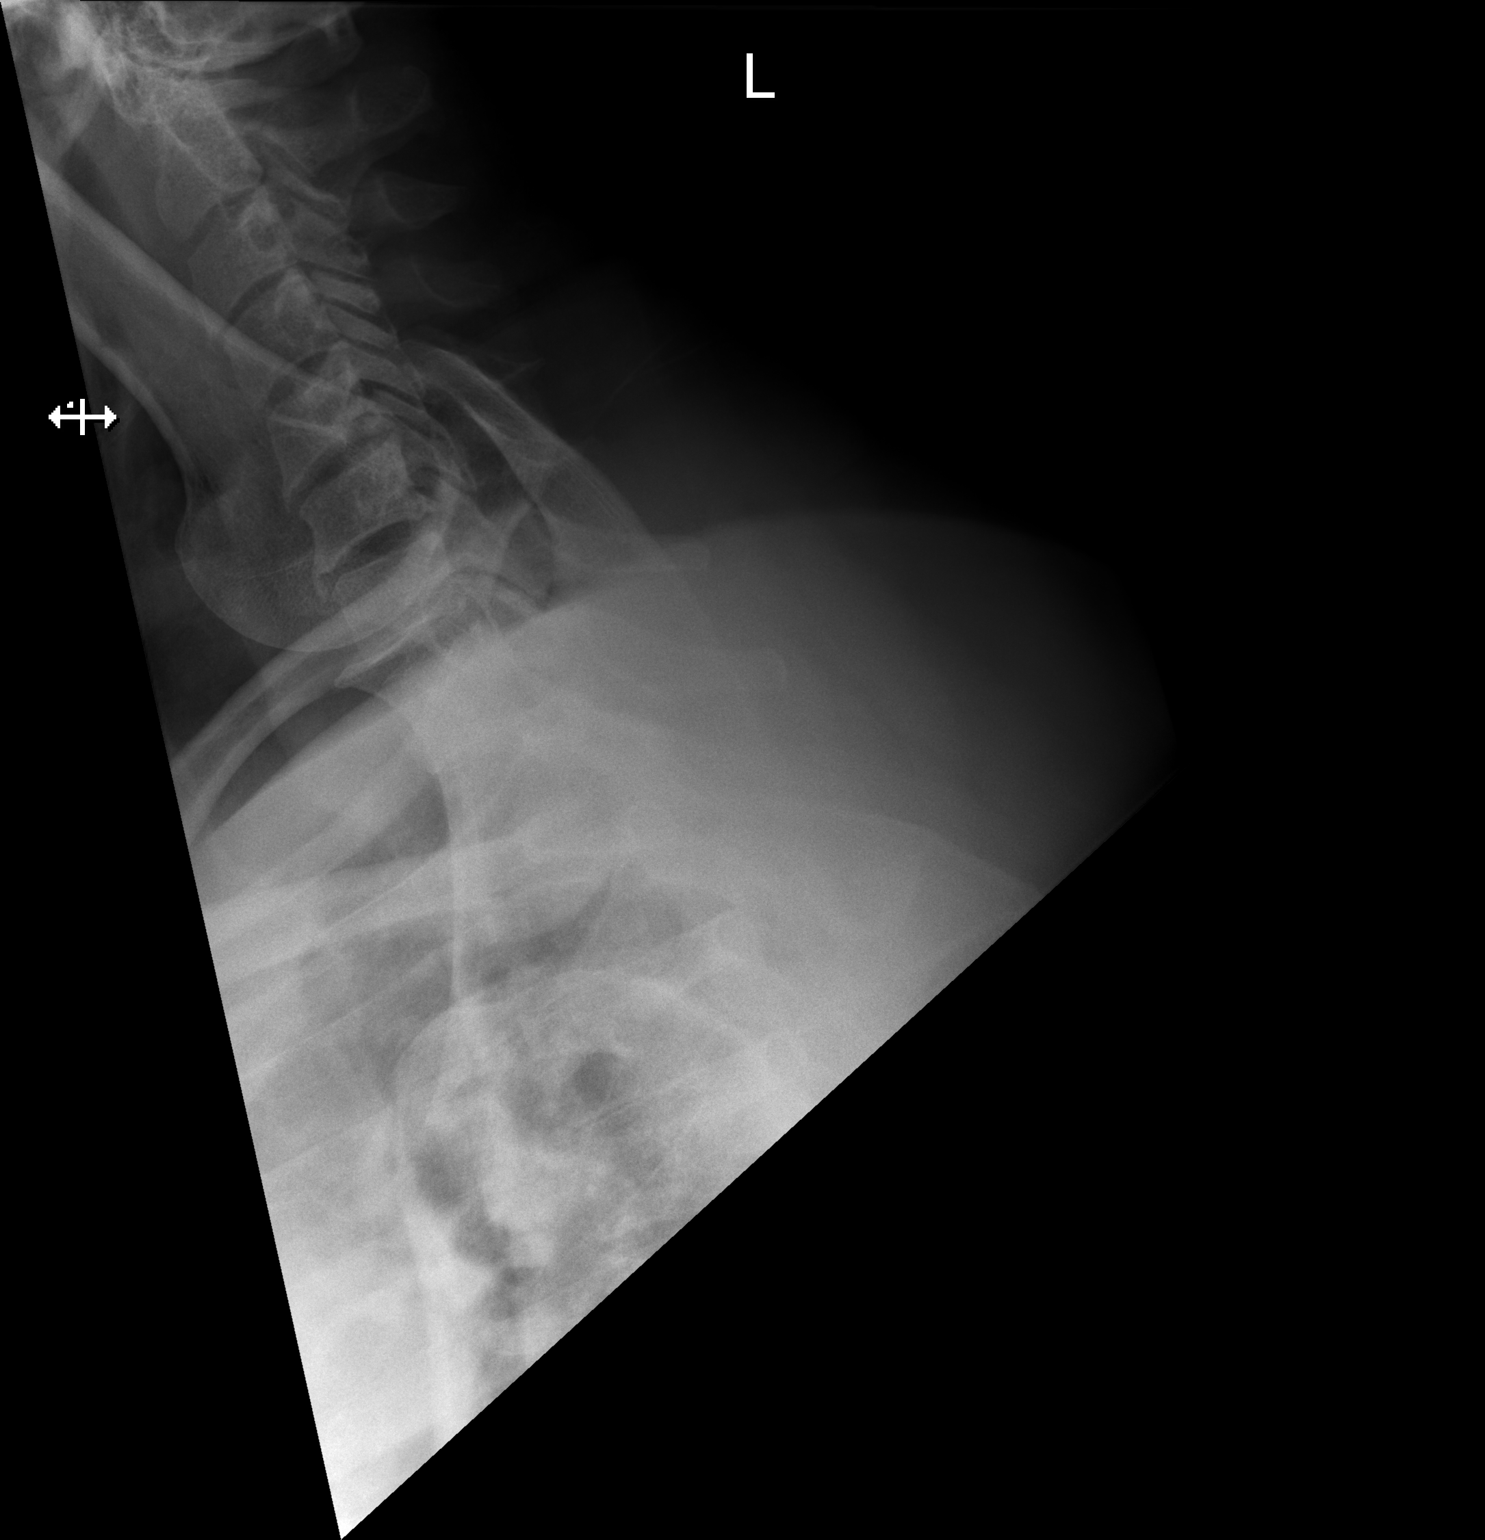

[3 of 3 positions shown; findings below may reference images not displayed]

FINDINGS: Normal anatomic alignment. Preservation of the vertebral body and
intervertebral disc space heights. C6-7 degenerative changes. No
evidence for acute fracture. Visualized aspect of the lungs are
unremarkable.
IMPRESSION: No acute osseous abnormality.

## 2015-11-11 MED ORDER — PREDNISONE 20 MG PO TABS: 40.0000 mg | ORAL_TABLET | Freq: Every day | ORAL | Status: DC

## 2015-11-11 MED ORDER — DIAZEPAM 5 MG PO TABS: 5.0000 mg | ORAL_TABLET | Freq: Four times a day (QID) | ORAL | Status: DC | PRN

## 2015-11-11 NOTE — ED Provider Notes (Signed)
CSN: [id number]     Arrival date & time 11/11/15  1020 History   First MD Initiated Contact with Patient 11/11/15 1050     Chief Complaint  Patient presents with  . Shoulder Pain    pain in r/shoulder and neck x 5 days  . Neck Pain     (Consider location/radiation/quality/duration/timing/severity/associated sxs/prior Treatment) The history is provided by the patient.     Pt p/w right sided neck and upper back pain that began 4 days ago.  The pain began when she woke up from sleep.  Pain initially switched to left side then returned and stayed on right, radiates from neck down into "rhomboids" and into right upper arm. States her right arm is starting to feel "heavy" as if it is getting weak.  Denies any numbness or tingling in the arm, or any pain past her mid humerus.  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  She denies any accidents, injuries, falls recently.  Has had sinusitis but no recent fevers and no other infectious symptoms. Has taken flexeril with no relief and was seen in ED 11/09/15 (2 days ago) dx with cervical radiculopathy and d/c with norco and referral to [facility].  She has taken the norco without relief but has not contacted the orthopedist.  She has also taken OTC pain medications without relief.   Past Medical History  Diagnosis Date  . GERD (gastroesophageal reflux disease)   . Hypertension    Past Surgical History  Procedure Laterality Date  . Cholecystectomy    . Gastrectomy    . Cesarean section    . Wrist fracture surgery    . Knee surgery    . Gastric bypass     Family History  Problem Relation Age of Onset  . Heart failure Father   . Diabetes Father    Social History  Substance Use Topics  . Smoking status: Never Smoker   . Smokeless tobacco: None  . Alcohol Use: No   OB History    No data available     Review of Systems  Constitutional: Negative for fever and chills.    Respiratory: Negative for shortness of breath.   Cardiovascular: Negative for chest pain.  Musculoskeletal: Positive for myalgias, back pain and neck pain.  Skin: Negative for rash.  Allergic/Immunologic: Negative for immunocompromised state.  Neurological: Positive for weakness. Negative for numbness.  Hematological: Does not bruise/bleed easily.  Psychiatric/Behavioral: Negative for self-injury.      Allergies  Latex  Home Medications   Prior to Admission medications   Medication Sig Start Date End Date Taking? Authorizing Provider  HYDROcodone-acetaminophen (NORCO) 5-325 MG tablet Take 1-2 tablets by mouth every 6 (six) hours as needed (for pain). 11/09/15  Yes John Molpus, MD  buPROPion (WELLBUTRIN XL) 300 MG 24 hr tablet Take 300 mg by mouth daily.    Historical Provider, MD  levonorgestrel-ethinyl estradiol (ENPRESSE,TRIVORA) tablet Take 1 tablet by mouth daily.    Historical Provider, MD  OMEPRAZOLE PO Take by mouth.    Historical Provider, MD  ranitidine (ZANTAC) 150 MG tablet Take 1 tablet (150 mg total) by mouth 2 (two) times daily. 07/07/14   [name], MD  sertraline (ZOLOFT) 25 MG tablet Take 25 mg by mouth daily.    Historical Provider, MD  sucralfate (CARAFATE) 1 GM/10ML suspension Take 10 mLs (1 g total) by mouth 4 (four) times daily -  with meals  and at bedtime. 07/07/14   [name], MD   BP 123/79 mmHg  Pulse 77  Temp(Src) 98.1 F (36.7 C) (Oral)  Resp 18  SpO2 99%  LMP 10/20/2015 (Approximate) Physical Exam  Constitutional: She appears well-developed and well-nourished. No distress.  HENT:  Head: Normocephalic and atraumatic.  Eyes: Conjunctivae are normal.  Neck: Normal range of motion. Neck supple.  Cardiovascular: Normal rate and regular rhythm.   Pulmonary/Chest: Effort normal and breath sounds normal. No respiratory distress. She has no wheezes. She has no rales. She exhibits no tenderness.  Musculoskeletal:        Back:  Upper extremities:  Strength 5/5 with exception of mild decrease in strength (likely secondary to pain) when engaging biceps and deltoid, sensation intact, distal pulses intact.     Neurological: She is alert.  Skin: No rash noted. She is not diaphoretic. No erythema.  Psychiatric: She has a normal mood and affect. Her behavior is normal.  Nursing note and vitals reviewed.   ED Course  Procedures (including critical care time) Labs Review Labs Reviewed - No data to display  Imaging Review Dg Cervical Spine Complete  11/11/2015  CLINICAL DATA:  Neck pain for 2 weeks, more to the right than the left. EXAM: CERVICAL SPINE - COMPLETE 4+ VIEW COMPARISON:  None. FINDINGS: There is no fracture or subluxation or prevertebral soft tissue swelling. Slight diffuse narrowing of the disc spaces from C3-4 through C6-7. No foraminal stenosis or facet arthritis. IMPRESSION: Multilevel slight degenerative disc disease. Electronically Signed   By: Lorriane Shire M.D.   On: 11/11/2015 11:45   Dg Thoracic Spine 2 View  11/11/2015  CLINICAL DATA:  Patient with right greater than left neck and upper back pain for 2 weeks. No known injury. Initial encounter. EXAM: THORACIC SPINE 2 VIEWS COMPARISON:  None. FINDINGS: Normal anatomic alignment. Preservation of the vertebral body and intervertebral disc space heights. C6-7 degenerative changes. No evidence for acute fracture. Visualized aspect of the lungs are unremarkable. IMPRESSION: No acute osseous abnormality. Electronically Signed   By: Lovey Newcomer M.D.   On: 11/11/2015 11:51   I have personally reviewed and evaluated these images and lab results as part of my medical decision-making.   EKG Interpretation None      MDM   Final diagnoses:  DDD (degenerative disc disease), cervical  Cervical radiculopathy    Afebrile, nontoxic patient with neck and upper back pain/right upper arm pain x 4 days without injury.  Slight decrease in strength likely  secondary to pain.  No numbness, tingling.  Vascularly intact.  No rash present.  No relief with OTC medications, flexeril, or hydrocodone.   Xray demonstrates multilevel DDD.   D/C home with prednisone, valium, orthopedic follow up as before (ED visit two days ago).  Pt advised to not combine sedating medications.  No red flags.  Discussed result, findings, treatment, and follow up  with patient.  Pt given return precautions.  Pt verbalizes understanding and agrees with plan.        Clayton Bibles, PA-C 11/11/15 1430  Leo Grosser, MD 11/11/15 2013

## 2015-11-11 NOTE — Discharge Instructions (Signed)
Read the information below.  Use the prescribed medication as directed.  Please discuss all new medications with your pharmacist.  You may return to the Emergency Department at any time for worsening condition or any new symptoms that concern you.    If there is any possibility that you might be pregnant or you are breastfeeding, please let your health care provider know and discuss this with the pharmacist to ensure medication safety.   If you develop fevers, loss of control of bowel or bladder, weakness or numbness in your arms or legs, or are unable to walk, return to the ER for a recheck.  Please be cautious about using multiple sedating medications at the same time.     Cervical Radiculopathy Cervical radiculopathy happens when a nerve in the neck (cervical nerve) is pinched or bruised. This condition can develop because of an injury or as part of the normal aging process. Pressure on the cervical nerves can cause pain or numbness that runs from the neck all the way down into the arm and fingers. Usually, this condition gets better with rest. Treatment may be needed if the condition does not improve.  CAUSES This condition may be caused by:  Injury.  Slipped (herniated) disk.  Muscle tightness in the neck because of overuse.  Arthritis.  Breakdown or degeneration in the bones and joints of the spine (spondylosis) due to aging.  Bone spurs that may develop near the cervical nerves. SYMPTOMS Symptoms of this condition include:  Pain that runs from the neck to the arm and hand. The pain can be severe or irritating. It may be worse when the neck is moved.  Numbness or weakness in the affected arm and hand. DIAGNOSIS This condition may be diagnosed based on symptoms, medical history, and a physical exam. You may also have tests, including:  X-rays.  CT scan.  MRI.  Electromyogram (EMG).  Nerve conduction tests. TREATMENT In many cases, treatment is not needed for this condition.  With rest, the condition usually gets better over time. If treatment is needed, options may include:  Wearing a soft neck collar for short periods of time.  Physical therapy to strengthen your neck muscles.  Medicines, such as NSAIDs, oral corticosteroids, or spinal injections.  Surgery. This may be needed if other treatments do not help. Various types of surgery may be done depending on the cause of your problems. HOME CARE INSTRUCTIONS Managing Pain  Take over-the-counter and prescription medicines only as told by your health care provider.  If directed, apply ice to the affected area.  Put ice in a plastic bag.  Place a towel between your skin and the bag.  Leave the ice on for 20 minutes, 2-3 times per day.  If ice does not help, you can try using heat. Take a warm shower or warm bath, or use a heat pack as told by your health care provider.  Try a gentle neck and shoulder massage to help relieve symptoms. Activity  Rest as needed. Follow instructions from your health care provider about any restrictions on activities.  Do stretching and strengthening exercises as told by your health care provider or physical therapist. General Instructions  If you were given a soft collar, wear it as told by your health care provider.  Use a flat pillow when you sleep.  Keep all follow-up visits as told by your health care provider. This is important. SEEK MEDICAL CARE IF:  Your condition does not improve with treatment. SEEK IMMEDIATE  MEDICAL CARE IF:  Your pain gets much worse and cannot be controlled with medicines.  You have weakness or numbness in your hand, arm, face, or leg.  You have a high fever.  You have a stiff, rigid neck.  You lose control of your bowels or your bladder (have incontinence).  You have trouble with walking, balance, or speaking.   This information is not intended to replace advice given to you by your health care provider. Make sure you discuss  any questions you have with your health care provider.   Document Released: 09/02/2001 Document Revised: 08/29/2015 Document Reviewed: 02/01/2015 Elsevier Interactive Patient Education [employer].

## 2015-11-11 NOTE — ED Notes (Signed)
Pt reports 5 days hx of r/shoulder pain and neck pain. C/o shooting , burning pain in r/arm, shoulder to elbow. Denies trauma. Seen in ED 2 days ago. Hydrocodone and flexeril not effective

## 2016-10-29 ENCOUNTER — Encounter (HOSPITAL_BASED_OUTPATIENT_CLINIC_OR_DEPARTMENT_OTHER): Payer: Self-pay | Admitting: *Deleted

## 2016-10-29 ENCOUNTER — Emergency Department (HOSPITAL_BASED_OUTPATIENT_CLINIC_OR_DEPARTMENT_OTHER)
Admission: EM | Admit: 2016-10-29 | Discharge: 2016-10-29 | Disposition: A | Discharge status: Home / Self Care | Payer: Medicaid Other | Attending: Emergency Medicine | Admitting: Emergency Medicine

## 2016-10-29 DIAGNOSIS — I10 Essential (primary) hypertension: Secondary | ICD-10-CM | POA: Insufficient documentation

## 2016-10-29 DIAGNOSIS — H81399 Other peripheral vertigo, unspecified ear: Secondary | ICD-10-CM | POA: Insufficient documentation

## 2016-10-29 LAB — CBC WITH DIFFERENTIAL/PLATELET
Basophils Absolute: 0 10*3/uL (ref 0.0–0.1)
Basophils Relative: 0 %
Eosinophils Absolute: 0.2 10*3/uL (ref 0.0–0.7)
Eosinophils Relative: 2 %
HCT: 34.7 % — ABNORMAL LOW (ref 36.0–46.0)
Hemoglobin: 12.3 g/dL (ref 12.0–15.0)
Lymphocytes Relative: 20 %
Lymphs Abs: 1.9 10*3/uL (ref 0.7–4.0)
MCH: 25.7 pg — ABNORMAL LOW (ref 26.0–34.0)
MCHC: 35.4 g/dL (ref 30.0–36.0)
MCV: 72.6 fL — ABNORMAL LOW (ref 78.0–100.0)
Monocytes Absolute: 1 10*3/uL (ref 0.1–1.0)
Monocytes Relative: 10 %
Neutro Abs: 6.5 10*3/uL (ref 1.7–7.7)
Neutrophils Relative %: 68 %
Platelets: 269 10*3/uL (ref 150–400)
RBC: 4.78 MIL/uL (ref 3.87–5.11)
RDW: 15.2 % (ref 11.5–15.5)
WBC: 9.6 10*3/uL (ref 4.0–10.5)

## 2016-10-29 LAB — BASIC METABOLIC PANEL
Anion gap: 3 — ABNORMAL LOW (ref 5–15)
BUN: 9 mg/dL (ref 6–20)
CO2: 26 mmol/L (ref 22–32)
Calcium: 8.7 mg/dL — ABNORMAL LOW (ref 8.9–10.3)
Chloride: 108 mmol/L (ref 101–111)
Creatinine, Ser: 0.85 mg/dL (ref 0.44–1.00)
GFR calc Af Amer: >60 mL/min (ref >60)
GFR calc non Af Amer: >60 mL/min (ref >60)
Glucose, Bld: 110 mg/dL — ABNORMAL HIGH (ref 65–99)
Potassium: 4.1 mmol/L (ref 3.5–5.1)
Sodium: 137 mmol/L (ref 135–145)

## 2016-10-29 MED ORDER — MECLIZINE HCL 25 MG PO TABS: 25.0000 mg | ORAL_TABLET | Freq: Three times a day (TID) | ORAL | 0 refills | Status: DC | PRN

## 2016-10-29 MED ORDER — ONDANSETRON 8 MG PO TBDP
8.0000 mg | ORAL_TABLET | Freq: Once | ORAL | Status: AC
  Administered 2016-10-29: 8 mg via ORAL
  Filled 2016-10-29: qty 1

## 2016-10-29 MED ORDER — MECLIZINE HCL 25 MG PO TABS
25.0000 mg | ORAL_TABLET | Freq: Once | ORAL | Status: AC
  Administered 2016-10-29: 25 mg via ORAL
  Filled 2016-10-29: qty 1

## 2016-10-29 MED ORDER — METOCLOPRAMIDE HCL 10 MG PO TABS: 10.0000 mg | ORAL_TABLET | Freq: Four times a day (QID) | ORAL | 0 refills | Status: DC | PRN

## 2016-10-29 NOTE — ED Provider Notes (Signed)
[facility] Provider Note   CSN: [id number] Arrival date & time: 10/29/16  0303     History   Chief Complaint Chief Complaint  Patient presents with  . Dizziness    HPI Krista Fisher is a 40 y.o. female.  She had onset at about 8:30 PM of a spinning sensation with associated nausea and dry heaves. The spinning is worse if she lays on her right side. She denies headache, tinnitus, ear pain, hearing loss. Nothing seems to make the spinning feeling any better. She is slightly off balance when she walks. There's been no weakness or numbness or tingling. She states that she feels generally full in her neck. She has not tried anything to treat this. She's never had similar sensation in the past.   The history is provided by the patient.  Dizziness    Past Medical History:  Diagnosis Date  . GERD (gastroesophageal reflux disease)   . Hypertension     Patient Active Problem List   Diagnosis Date Noted  . EDEMA 02/04/2011  . CHEST PAIN-UNSPECIFIED 02/04/2011  . VITAMIN D DEFICIENCY 12/03/2009  . EAR PAIN, BILATERAL 12/03/2009  . ALLERGIC RHINITIS 12/03/2009  . OBESITY 10/31/2009  . DEPRESSION 10/31/2009  . HYPERTENSION, BENIGN ESSENTIAL 10/31/2009  . BLOCK, OTHER HEART 10/31/2009  . URI 10/31/2009  . GERD 10/31/2009  . SLEEP APNEA 10/31/2009  . PALPITATIONS 10/31/2009    Past Surgical History:  Procedure Laterality Date  . CESAREAN SECTION    . CHOLECYSTECTOMY    . GASTRECTOMY    . GASTRIC BYPASS    . KNEE SURGERY    . WRIST FRACTURE SURGERY      OB History    No data available       Home Medications    Prior to Admission medications   Medication Sig Start Date End Date Taking? Authorizing Provider  buPROPion (WELLBUTRIN XL) 300 MG 24 hr tablet Take 300 mg by mouth daily.    Historical Provider, MD  diazepam (VALIUM) 5 MG tablet Take 1 tablet (5 mg total) by mouth every 6 (six) hours as needed for muscle spasms (and pain). 11/11/15   Clayton Bibles, PA-C  HYDROcodone-acetaminophen (NORCO) 5-325 MG tablet Take 1-2 tablets by mouth every 6 (six) hours as needed (for pain). 11/09/15   Shanon Rosser, MD  levonorgestrel-ethinyl estradiol (ENPRESSE,TRIVORA) tablet Take 1 tablet by mouth daily.    Historical Provider, MD  OMEPRAZOLE PO Take by mouth.    Historical Provider, MD  predniSONE (DELTASONE) 20 MG tablet Take 2 tablets (40 mg total) by mouth daily. 11/11/15   Clayton Bibles, PA-C  ranitidine (ZANTAC) 150 MG tablet Take 1 tablet (150 mg total) by mouth 2 (two) times daily. 07/07/14   [name], MD  sertraline (ZOLOFT) 25 MG tablet Take 25 mg by mouth daily.    Historical Provider, MD  sucralfate (CARAFATE) 1 GM/10ML suspension Take 10 mLs (1 g total) by mouth 4 (four) times daily -  with meals and at bedtime. 07/07/14   [name], MD    Family History Family History  Problem Relation Age of Onset  . Heart failure Father   . Diabetes Father     Social History Social History  Substance Use Topics  . Smoking status: Never Smoker  . Smokeless tobacco: Never Used  . Alcohol use No     Allergies   Latex   Review of Systems Review of Systems  Neurological: Positive for dizziness.  All  other systems reviewed and are negative.    Physical Exam Updated Vital Signs BP 139/89 (BP Location: Right Arm)   Pulse 80   Temp 98.8 F (37.1 C) (Oral)   Resp 18   Ht 5\' 6"  (1.676 m)   Wt (!) 340 lb (154.2 kg)   SpO2 99%   BMI 54.88 kg/m   Physical Exam  Nursing note and vitals reviewed.  Obese 40 year old female, resting comfortably and in no acute distress. Vital signs are normal. Oxygen saturation is 99%, which is normal. Head is normocephalic and atraumatic. PERRLA, EOMI. Rotary nystagmus is present. Oropharynx is clear. Neck is nontender and supple without adenopathy or JVD. Back is nontender and there is no CVA tenderness. Lungs are clear without rales, wheezes, or rhonchi. Chest is  nontender. Heart has regular rate and rhythm without murmur. Abdomen is soft, flat, nontender without masses or hepatosplenomegaly and peristalsis is normoactive. Extremities have no cyanosis or edema, full range of motion is present. Skin is warm and dry without rash. Neurologic: Mental status is normal, cranial nerves are intact, there are no motor or sensory deficits. Dizziness is reproduced by passive head movement.  ED Treatments / Results  Labs (all labs ordered are listed, but only abnormal results are displayed) Labs Reviewed  CBC WITH DIFFERENTIAL/PLATELET - Abnormal; Notable for the following:       Result Value   HCT 34.7 (*)    MCV 72.6 (*)    MCH 25.7 (*)    All other components within normal limits  BASIC METABOLIC PANEL - Abnormal; Notable for the following:    Glucose, Bld 110 (*)    Calcium 8.7 (*)    Anion gap 3 (*)    All other components within normal limits    EKGProcedures Procedures (including critical care time)  Medications Ordered in ED Medications  ondansetron (ZOFRAN-ODT) disintegrating tablet 8 mg (not administered)  meclizine (ANTIVERT) tablet 25 mg (not administered)     Initial Impression / Assessment and Plan / ED Course  I have reviewed the triage vital signs and the nursing notes.  Pertinent lab results that were available during my care of the patient were reviewed by me and considered in my medical decision making (see chart for details).  Clinical Course    Peripheral vertigo. No evidence of stroke. No red flags to suggest central vertigo. We'll check screening labs. Will treat nausea with ondansetron, and will give a dose of meclizine. Old records are reviewed, and she has no relevant past visits.  She feels much better after above noted treatment. She is discharged with prescriptions for metoclopramide and meclizine.  Final Clinical Impressions(s) / ED Diagnoses   Final diagnoses:  Peripheral vertigo, unspecified laterality     New Prescriptions New Prescriptions   MECLIZINE (ANTIVERT) 25 MG TABLET    Take 1 tablet (25 mg total) by mouth 3 (three) times daily as needed for dizziness.   METOCLOPRAMIDE (REGLAN) 10 MG TABLET    Take 1 tablet (10 mg total) by mouth every 6 (six) hours as needed for nausea.     Delora Fuel, MD [id number]

## 2016-10-29 NOTE — Discharge Instructions (Signed)
Take meclizine (either prescription or over-the-counter) three times a day as needed to control your vertigo.

## 2016-10-29 NOTE — ED Triage Notes (Signed)
C/o dizziness onset last pm  Feels like room is spinning  Having some nausea,  States feels like rt side of face is swollen  Denies pain

## 2017-01-24 ENCOUNTER — Encounter (HOSPITAL_BASED_OUTPATIENT_CLINIC_OR_DEPARTMENT_OTHER): Payer: Self-pay | Admitting: Emergency Medicine

## 2017-01-24 DIAGNOSIS — R1013 Epigastric pain: Secondary | ICD-10-CM | POA: Insufficient documentation

## 2017-01-24 DIAGNOSIS — Z79899 Other long term (current) drug therapy: Secondary | ICD-10-CM | POA: Insufficient documentation

## 2017-01-24 DIAGNOSIS — R0789 Other chest pain: Secondary | ICD-10-CM | POA: Insufficient documentation

## 2017-01-24 DIAGNOSIS — I10 Essential (primary) hypertension: Secondary | ICD-10-CM | POA: Insufficient documentation

## 2017-01-24 DIAGNOSIS — Z91040 Latex allergy status: Secondary | ICD-10-CM | POA: Insufficient documentation

## 2017-01-24 NOTE — ED Triage Notes (Signed)
Patient states that in the last couple of hours that patient has had mid sternal chest pain. Worse after eating

## 2017-01-25 ENCOUNTER — Emergency Department (HOSPITAL_BASED_OUTPATIENT_CLINIC_OR_DEPARTMENT_OTHER)
Admission: EM | Admit: 2017-01-25 | Discharge: 2017-01-25 | Disposition: A | Discharge status: Home / Self Care | Payer: Self-pay | Attending: Emergency Medicine | Admitting: Emergency Medicine

## 2017-01-25 ENCOUNTER — Emergency Department (HOSPITAL_BASED_OUTPATIENT_CLINIC_OR_DEPARTMENT_OTHER): Payer: Self-pay

## 2017-01-25 DIAGNOSIS — R0789 Other chest pain: Secondary | ICD-10-CM

## 2017-01-25 LAB — CBC WITH DIFFERENTIAL/PLATELET
Basophils Absolute: 0 10*3/uL (ref 0.0–0.1)
Basophils Relative: 0 %
Eosinophils Absolute: 0.2 10*3/uL (ref 0.0–0.7)
Eosinophils Relative: 2 %
HCT: 34.4 % — ABNORMAL LOW (ref 36.0–46.0)
Hemoglobin: 12.1 g/dL (ref 12.0–15.0)
Lymphocytes Relative: 28 %
Lymphs Abs: 2.5 10*3/uL (ref 0.7–4.0)
MCH: 25.8 pg — ABNORMAL LOW (ref 26.0–34.0)
MCHC: 35.2 g/dL (ref 30.0–36.0)
MCV: 73.3 fL — ABNORMAL LOW (ref 78.0–100.0)
Monocytes Absolute: 0.9 10*3/uL (ref 0.1–1.0)
Monocytes Relative: 10 %
Neutro Abs: 5.5 10*3/uL (ref 1.7–7.7)
Neutrophils Relative %: 60 %
Platelets: 256 10*3/uL (ref 150–400)
RBC: 4.69 MIL/uL (ref 3.87–5.11)
RDW: 14.3 % (ref 11.5–15.5)
WBC: 9.1 10*3/uL (ref 4.0–10.5)

## 2017-01-25 LAB — COMPREHENSIVE METABOLIC PANEL
ALT: 15 U/L (ref 14–54)
AST: 25 U/L (ref 15–41)
Albumin: 3.6 g/dL (ref 3.5–5.0)
Alkaline Phosphatase: 75 U/L (ref 38–126)
Anion gap: 8 (ref 5–15)
BUN: 10 mg/dL (ref 6–20)
CO2: 25 mmol/L (ref 22–32)
Calcium: 9 mg/dL (ref 8.9–10.3)
Chloride: 105 mmol/L (ref 101–111)
Creatinine, Ser: 1.08 mg/dL — ABNORMAL HIGH (ref 0.44–1.00)
GFR calc Af Amer: >60 mL/min (ref >60)
GFR calc non Af Amer: >60 mL/min (ref >60)
Glucose, Bld: 100 mg/dL — ABNORMAL HIGH (ref 65–99)
Potassium: 4.2 mmol/L (ref 3.5–5.1)
Sodium: 138 mmol/L (ref 135–145)
Total Bilirubin: 0.5 mg/dL (ref 0.3–1.2)
Total Protein: 7.3 g/dL (ref 6.5–8.1)

## 2017-01-25 LAB — TROPONIN I
Troponin I: <0.03 ng/mL (ref <0.03)
Troponin I: <0.03 ng/mL (ref <0.03)

## 2017-01-25 LAB — LIPASE, BLOOD: Lipase: 28 U/L (ref 11–51)

## 2017-01-25 MED ORDER — GI COCKTAIL ~~LOC~~
30.0000 mL | Freq: Once | ORAL | Status: AC
  Administered 2017-01-25: 30 mL via ORAL
  Filled 2017-01-25: qty 30

## 2017-01-25 MED ORDER — ASPIRIN 81 MG PO CHEW
324.0000 mg | CHEWABLE_TABLET | Freq: Once | ORAL | Status: AC
  Administered 2017-01-25: 324 mg via ORAL
  Filled 2017-01-25: qty 4

## 2017-01-25 NOTE — ED Provider Notes (Signed)
[facility] Provider Note   CSN: [id number] Arrival date & time: 01/24/17  2340   By signing my name below, I, Eunice Blase, attest that this documentation has been prepared under the direction and in the presence of Deno Etienne, DO. Electronically signed, Eunice Blase, ED Scribe. 01/25/17. 1:01 AM.   History   Chief Complaint Chief Complaint  Patient presents with  . Chest Pain   The history is provided by the patient and medical records. No language interpreter was used.    HPI Comments: Krista Fisher is a 41 y.o. female who presents to the Emergency Department complaining of persistent heart palpitation and chest pain x 2 days. She notes her pain is worse when eating. Pt reports associated nausea and SOB. Hx of HTN noted. FMHx of TIA via her father in his early 92's. Pt denies tobacco use, Hx of Dm, cancer, leg swelling or blood clots, recent long distance travel or birth control use.   Past Medical History:  Diagnosis Date  . GERD (gastroesophageal reflux disease)   . Hypertension     Patient Active Problem List   Diagnosis Date Noted  . EDEMA 02/04/2011  . CHEST PAIN-UNSPECIFIED 02/04/2011  . VITAMIN D DEFICIENCY 12/03/2009  . EAR PAIN, BILATERAL 12/03/2009  . ALLERGIC RHINITIS 12/03/2009  . OBESITY 10/31/2009  . DEPRESSION 10/31/2009  . HYPERTENSION, BENIGN ESSENTIAL 10/31/2009  . BLOCK, OTHER HEART 10/31/2009  . URI 10/31/2009  . GERD 10/31/2009  . SLEEP APNEA 10/31/2009  . PALPITATIONS 10/31/2009    Past Surgical History:  Procedure Laterality Date  . CESAREAN SECTION    . CHOLECYSTECTOMY    . GASTRECTOMY    . GASTRIC BYPASS    . KNEE SURGERY    . WRIST FRACTURE SURGERY      OB History    No data available       Home Medications    Prior to Admission medications   Medication Sig Start Date End Date Taking? Authorizing Provider  buPROPion (WELLBUTRIN XL) 300 MG 24 hr tablet Take 300 mg by mouth daily.    Historical Provider, MD    diazepam (VALIUM) 5 MG tablet Take 1 tablet (5 mg total) by mouth every 6 (six) hours as needed for muscle spasms (and pain). 11/11/15   Clayton Bibles, PA-C  HYDROcodone-acetaminophen (NORCO) 5-325 MG tablet Take 1-2 tablets by mouth every 6 (six) hours as needed (for pain). 11/09/15   Shanon Rosser, MD  levonorgestrel-ethinyl estradiol (ENPRESSE,TRIVORA) tablet Take 1 tablet by mouth daily.    Historical Provider, MD  meclizine (ANTIVERT) 25 MG tablet Take 1 tablet (25 mg total) by mouth 3 (three) times daily as needed for dizziness. [id number]   Delora Fuel, MD  metoCLOPramide (REGLAN) 10 MG tablet Take 1 tablet (10 mg total) by mouth every 6 (six) hours as needed for nausea. [id number]   Delora Fuel, MD  OMEPRAZOLE PO Take by mouth.    Historical Provider, MD  predniSONE (DELTASONE) 20 MG tablet Take 2 tablets (40 mg total) by mouth daily. 11/11/15   Clayton Bibles, PA-C  ranitidine (ZANTAC) 150 MG tablet Take 1 tablet (150 mg total) by mouth 2 (two) times daily. 07/07/14   [name], MD  sertraline (ZOLOFT) 25 MG tablet Take 25 mg by mouth daily.    Historical Provider, MD  sucralfate (CARAFATE) 1 GM/10ML suspension Take 10 mLs (1 g total) by mouth 4 (four) times daily -  with meals and at bedtime. 07/07/14   Gwenyth Allegra  Betsey Holiday, MD    Family History Family History  Problem Relation Age of Onset  . Heart failure Father   . Diabetes Father     Social History Social History  Substance Use Topics  . Smoking status: Never Smoker  . Smokeless tobacco: Never Used  . Alcohol use No     Allergies   Latex   Review of Systems Review of Systems  Constitutional: Positive for appetite change. Negative for chills.  HENT: Negative for congestion and rhinorrhea.   Eyes: Negative for redness and visual disturbance.  Respiratory: Negative for wheezing.   Genitourinary: Negative for dysuria and urgency.  Musculoskeletal: Negative for arthralgias and myalgias.  Skin: Negative for pallor and  wound.     Physical Exam Updated Vital Signs BP 134/87 (BP Location: Right Arm)   Pulse 85   Temp 98.7 F (37.1 C) (Oral)   Resp 18   Ht 5\' 6"  (1.676 m)   Wt (!) 320 lb (145.2 kg)   LMP 12/19/2016 (Within Days)   SpO2 100%   BMI 51.65 kg/m   Physical Exam  Constitutional: She is oriented to person, place, and time. She appears well-developed and well-nourished. No distress.  HENT:  Head: Normocephalic and atraumatic.  Eyes: EOM are normal. Pupils are equal, round, and reactive to light.  Neck: Normal range of motion. Neck supple.  Cardiovascular: Normal rate and regular rhythm.  Exam reveals no gallop and no friction rub.   No murmur heard. Pulmonary/Chest: Effort normal. She has no wheezes. She has no rales.  Abdominal: Soft. She exhibits no distension. There is tenderness in the epigastric area.  Musculoskeletal: She exhibits no edema or tenderness.  Neurological: She is alert and oriented to person, place, and time.  Skin: Skin is warm and dry. She is not diaphoretic.  Psychiatric: She has a normal mood and affect. Her behavior is normal.  Nursing note and vitals reviewed.    ED Treatments / Results  DIAGNOSTIC STUDIES: Oxygen Saturation is 100% on RA, normal by my interpretation.    COORDINATION OF CARE: 12:59 AM Discussed treatment plan with pt at bedside and pt agreed to plan.  Labs (all labs ordered are listed, but only abnormal results are displayed) Labs Reviewed  CBC WITH DIFFERENTIAL/PLATELET - Abnormal; Notable for the following:       Result Value   HCT 34.4 (*)    MCV 73.3 (*)    MCH 25.8 (*)    All other components within normal limits  COMPREHENSIVE METABOLIC PANEL - Abnormal; Notable for the following:    Glucose, Bld 100 (*)    Creatinine, Ser 1.08 (*)    All other components within normal limits  LIPASE, BLOOD  TROPONIN I  TROPONIN I    EKG  EKG Interpretation  Date/Time:  Saturday January 24 2017 23:46:05 EST Ventricular Rate:   85 PR Interval:  230 QRS Duration: 68 QT Interval:  344 QTC Calculation: 409 R Axis:   60 Text Interpretation:  Sinus rhythm with 1st degree A-V block Low voltage QRS Nonspecific T wave abnormality Abnormal ECG No significant change since last tracing Confirmed by Socrates Cahoon MD, DANIEL [phone]) on 01/25/2017 12:05:58 AM       Radiology Dg Chest 2 View  Result Date: 01/25/2017 CLINICAL DATA:  Chest pain for 3 hours. Lightheadedness. Onset tonight. EXAM: CHEST  2 VIEW COMPARISON:  04/13/2015 FINDINGS: The heart size and mediastinal contours are within normal limits. Both lungs are clear. The visualized skeletal structures are unremarkable.  IMPRESSION: No active cardiopulmonary disease. Electronically Signed   By: Andreas Newport M.D.   On: 01/25/2017 00:57    Procedures Procedures (including critical care time)  Medications Ordered in ED Medications  gi cocktail (Maalox,Lidocaine,Donnatal) (30 mLs Oral Given 01/25/17 0123)  aspirin chewable tablet 324 mg (324 mg Oral Given 01/25/17 0122)     Initial Impression / Assessment and Plan / ED Course  I have reviewed the triage vital signs and the nursing notes.  Pertinent labs & imaging results that were available during my care of the patient were reviewed by me and considered in my medical decision making (see chart for details).     41 yo F With a chief complaint of epigastric abdominal pain with some radiation up into the neck. Worse with eating. Suspect reflux disease. Reproduced with palpation of the epigastrium. Patient does have a family history of early MI. Delta troponin is negative. Discharge home.   4:38 AM:  I have discussed the diagnosis/risks/treatment options with the patient and believe the pt to be eligible for discharge home to follow-up with PCP. We also discussed returning to the ED immediately if new or worsening sx occur. We discussed the sx which are most concerning (e.g., sudden worsening pain, fever, inability to tolerate  by mouth) that necessitate immediate return. Medications administered to the patient during their visit and any new prescriptions provided to the patient are listed below.  Medications given during this visit Medications  gi cocktail (Maalox,Lidocaine,Donnatal) (30 mLs Oral Given 01/25/17 0123)  aspirin chewable tablet 324 mg (324 mg Oral Given 01/25/17 0122)     The patient appears reasonably screen and/or stabilized for discharge and I doubt any other medical condition or other [facility] requiring further screening, evaluation, or treatment in the ED at this time prior to discharge.    I personally performed the services described in this documentation, which was scribed in my presence. The recorded information has been reviewed and is accurate.    Final Clinical Impressions(s) / ED Diagnoses   Final diagnoses:  Atypical chest pain    New Prescriptions New Prescriptions   No medications on file     Deno Etienne, DO 01/25/17 [id number]

## 2017-01-25 NOTE — Discharge Instructions (Signed)
Try zantac 150mg twice a day.  ° ° °

## 2017-04-13 ENCOUNTER — Emergency Department (HOSPITAL_BASED_OUTPATIENT_CLINIC_OR_DEPARTMENT_OTHER): Payer: Self-pay

## 2017-04-13 ENCOUNTER — Emergency Department (HOSPITAL_BASED_OUTPATIENT_CLINIC_OR_DEPARTMENT_OTHER)
Admission: EM | Admit: 2017-04-13 | Discharge: 2017-04-13 | Disposition: A | Discharge status: Home / Self Care | Payer: Self-pay | Attending: Emergency Medicine | Admitting: Emergency Medicine

## 2017-04-13 ENCOUNTER — Encounter (HOSPITAL_BASED_OUTPATIENT_CLINIC_OR_DEPARTMENT_OTHER): Payer: Self-pay | Admitting: *Deleted

## 2017-04-13 DIAGNOSIS — Z79899 Other long term (current) drug therapy: Secondary | ICD-10-CM | POA: Insufficient documentation

## 2017-04-13 DIAGNOSIS — R0789 Other chest pain: Secondary | ICD-10-CM | POA: Insufficient documentation

## 2017-04-13 DIAGNOSIS — I10 Essential (primary) hypertension: Secondary | ICD-10-CM | POA: Insufficient documentation

## 2017-04-13 DIAGNOSIS — R0602 Shortness of breath: Secondary | ICD-10-CM | POA: Insufficient documentation

## 2017-04-13 DIAGNOSIS — R42 Dizziness and giddiness: Secondary | ICD-10-CM | POA: Insufficient documentation

## 2017-04-13 LAB — BASIC METABOLIC PANEL
Anion gap: 12 (ref 5–15)
BUN: 10 mg/dL (ref 6–20)
CO2: 23 mmol/L (ref 22–32)
Calcium: 9.5 mg/dL (ref 8.9–10.3)
Chloride: 105 mmol/L (ref 101–111)
Creatinine, Ser: 1.13 mg/dL — ABNORMAL HIGH (ref 0.44–1.00)
GFR calc Af Amer: >60 mL/min (ref >60)
GFR calc non Af Amer: 60 mL/min — ABNORMAL LOW (ref >60)
Glucose, Bld: 129 mg/dL — ABNORMAL HIGH (ref 65–99)
Potassium: 3.5 mmol/L (ref 3.5–5.1)
Sodium: 140 mmol/L (ref 135–145)

## 2017-04-13 LAB — CBC WITH DIFFERENTIAL/PLATELET
Basophils Absolute: 0 10*3/uL (ref 0.0–0.1)
Basophils Relative: 0 %
Eosinophils Absolute: 0.2 10*3/uL (ref 0.0–0.7)
Eosinophils Relative: 2 %
HCT: 38.7 % (ref 36.0–46.0)
Hemoglobin: 13.6 g/dL (ref 12.0–15.0)
Lymphocytes Relative: 21 %
Lymphs Abs: 2.6 10*3/uL (ref 0.7–4.0)
MCH: 25.4 pg — ABNORMAL LOW (ref 26.0–34.0)
MCHC: 35.1 g/dL (ref 30.0–36.0)
MCV: 72.2 fL — ABNORMAL LOW (ref 78.0–100.0)
Monocytes Absolute: 1.4 10*3/uL — ABNORMAL HIGH (ref 0.1–1.0)
Monocytes Relative: 11 %
Neutro Abs: 8.2 10*3/uL — ABNORMAL HIGH (ref 1.7–7.7)
Neutrophils Relative %: 66 %
Platelets: 334 10*3/uL (ref 150–400)
RBC: 5.36 MIL/uL — ABNORMAL HIGH (ref 3.87–5.11)
RDW: 15 % (ref 11.5–15.5)
WBC: 12.4 10*3/uL — ABNORMAL HIGH (ref 4.0–10.5)

## 2017-04-13 LAB — TROPONIN I
Troponin I: <0.03 ng/mL (ref <0.03)
Troponin I: <0.03 ng/mL (ref <0.03)

## 2017-04-13 IMAGING — CT CT ANGIO CHEST
2 of 8 series · 19 of 36 positions shown · IV contrast (isovue)
Comparison: Prior radiograph from [DATE].

CLINICAL DATA: Initial evaluation for acute pleuritic chest pain,
shortness of breath.

EXAM:
CT ANGIOGRAPHY CHEST WITH CONTRAST
TECHNIQUE: Multidetector CT imaging of the chest was performed using the
standard protocol during bolus administration of intravenous
contrast. Multiplanar CT image reconstructions and MIPs were
obtained to evaluate the vascular anatomy.
CONTRAST:  Under cc of Isovue 370.

[Series 6: pe coronal mpr · coronal · 0.57mm/px · 1 of 101 slices shown]
[im 51/101  mediastinal]
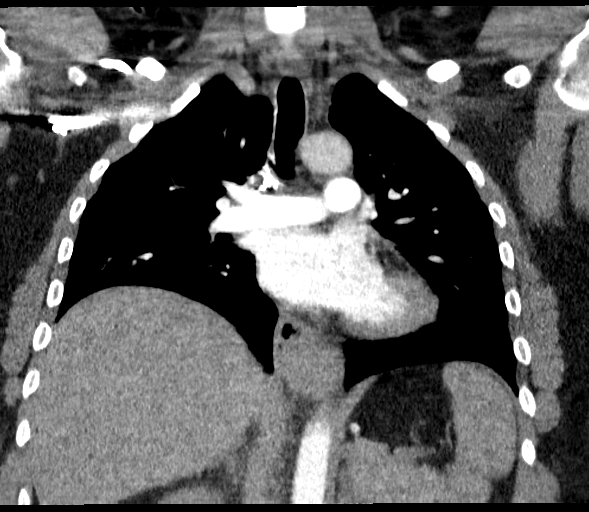

[Series 10: pe thins · axial · 0.81mm/px · z∈[+729,+980]mm · 18 of 372 slices shown]
[im 19/372  lung]
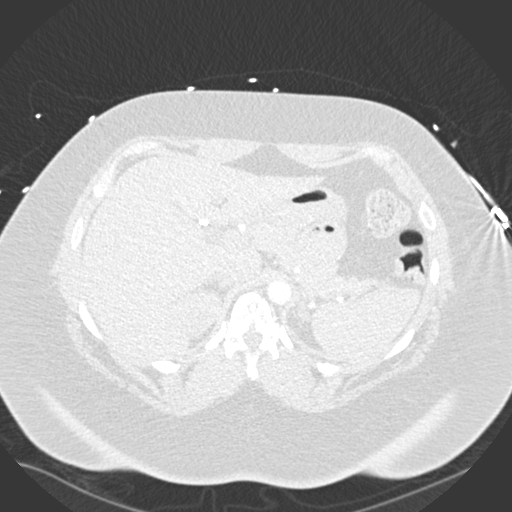
[im 38/372  mediastinal]
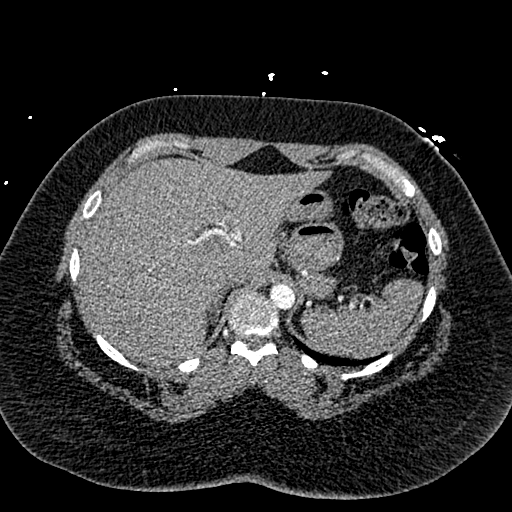
[im 56/372  lung]
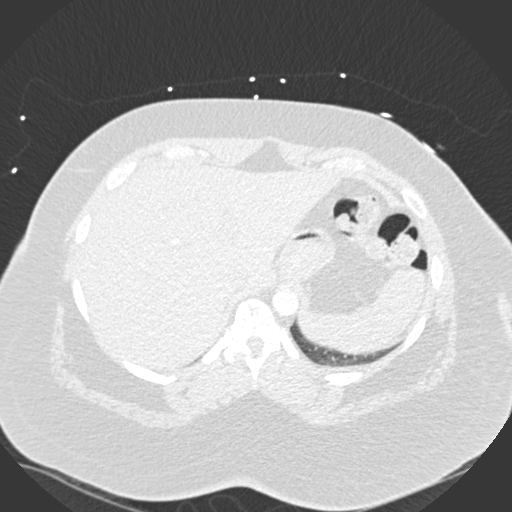
[im 75/372  mediastinal]
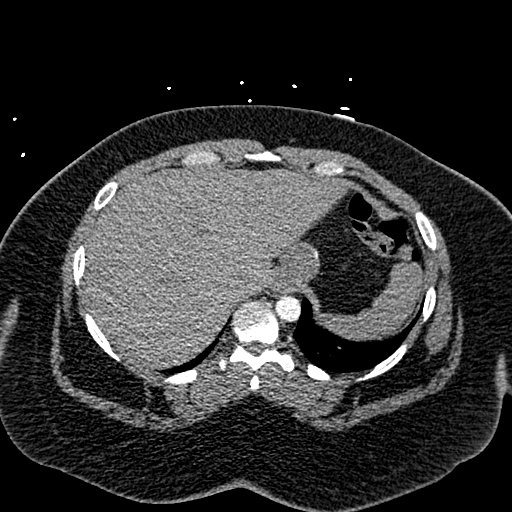
[im 93/372  lung]
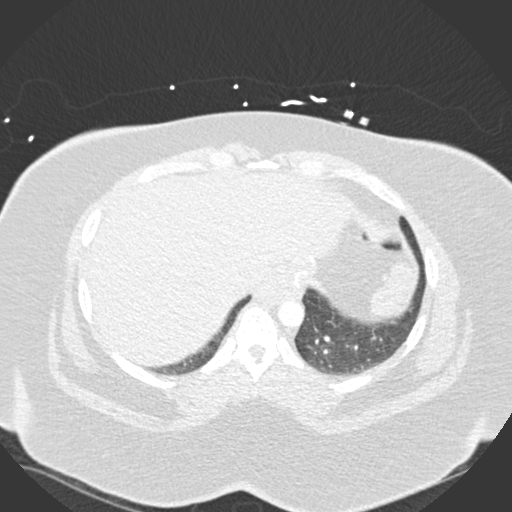
[im 112/372  mediastinal]
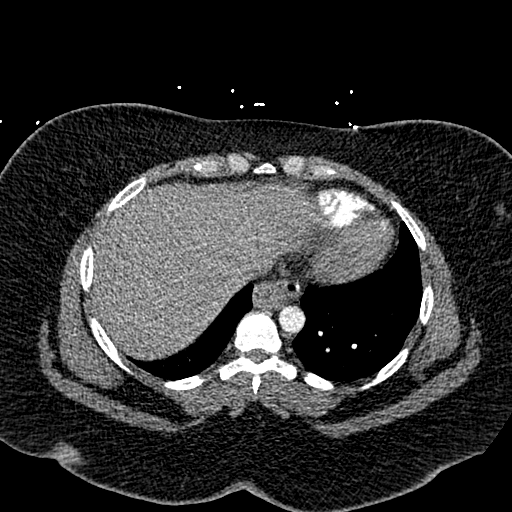
[im 130/372  lung]
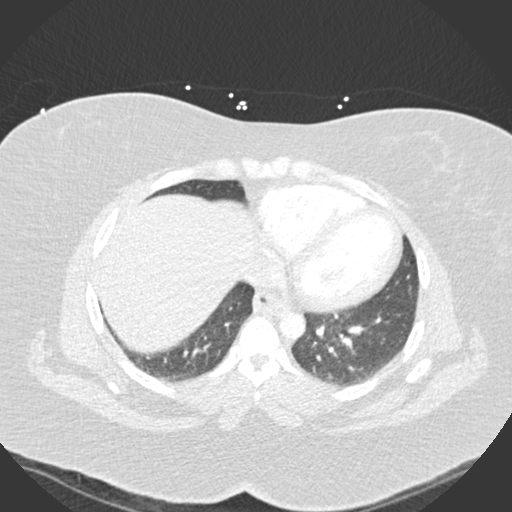
[im 149/372  mediastinal]
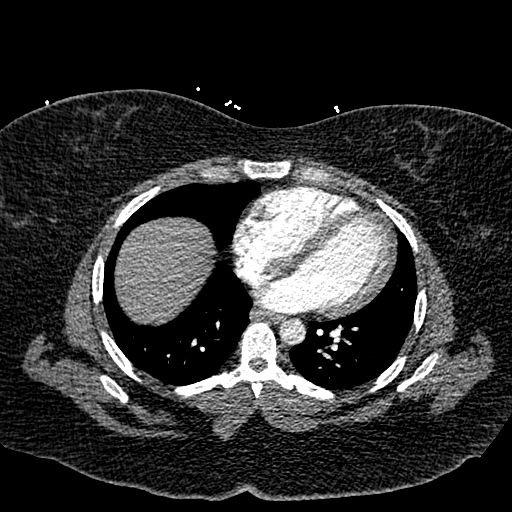
[im 167/372  lung]
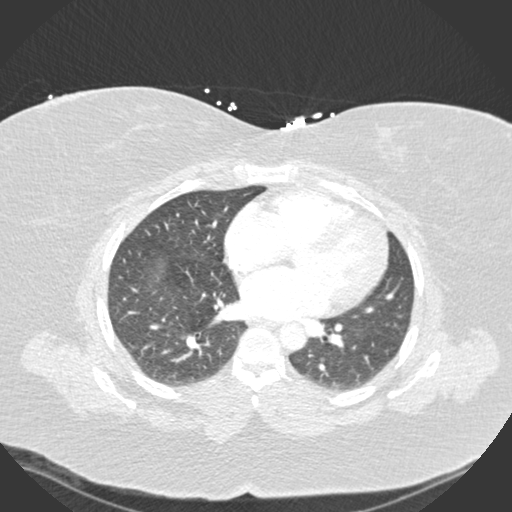
[im 205/372  mediastinal]
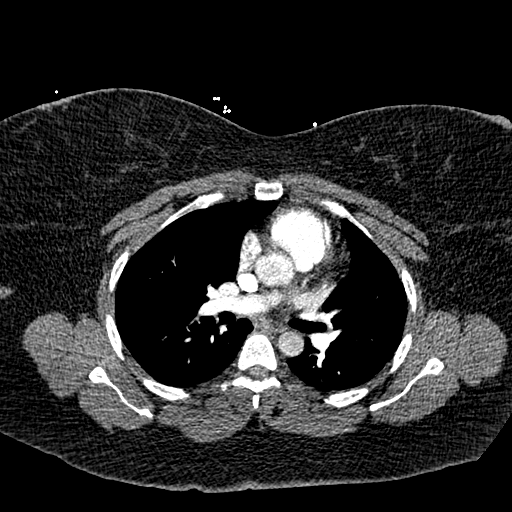
[im 223/372  lung]
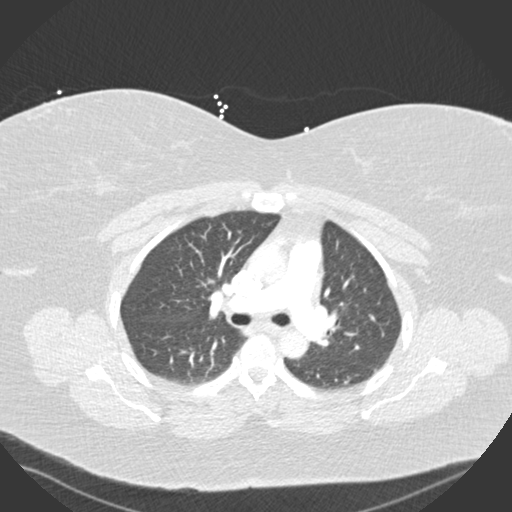
[im 242/372  mediastinal]
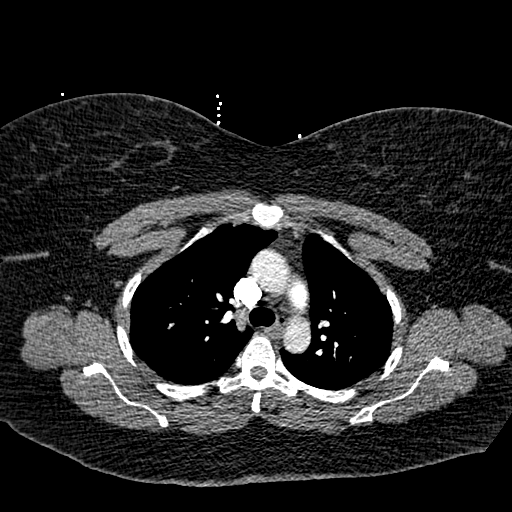
[im 260/372  lung]
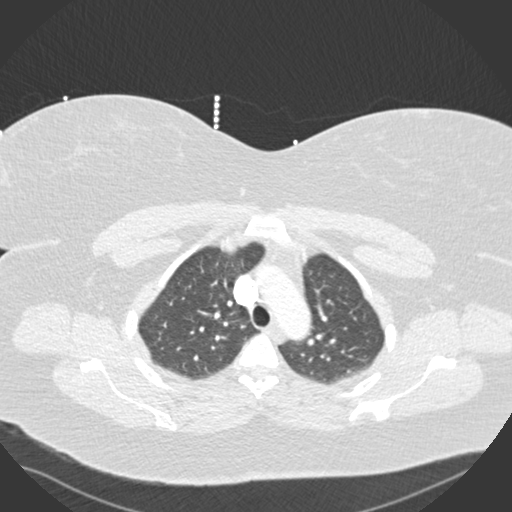
[im 279/372  mediastinal]
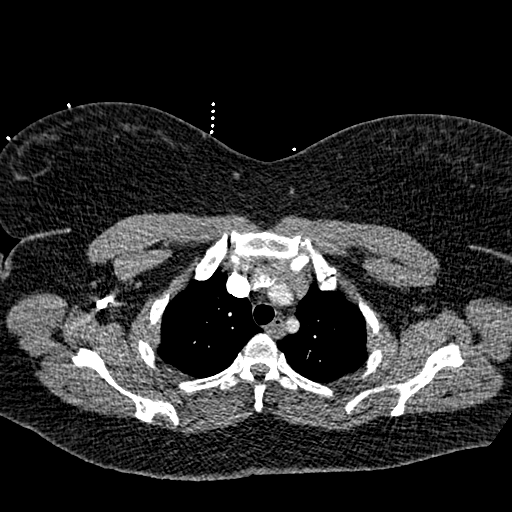
[im 297/372  lung]
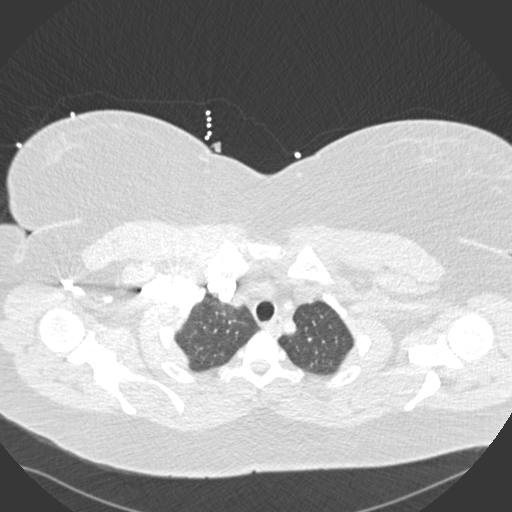
[im 316/372  mediastinal]
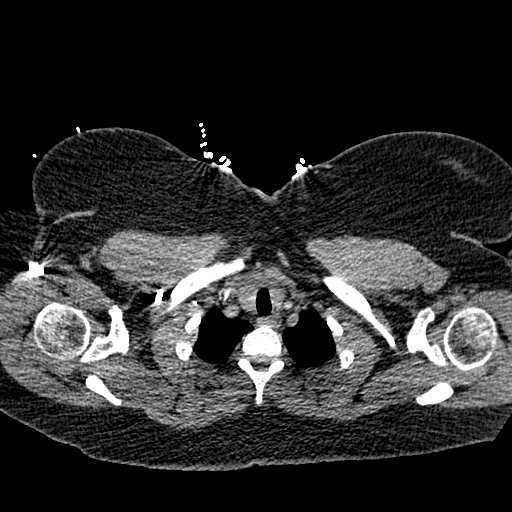
[im 334/372  lung]
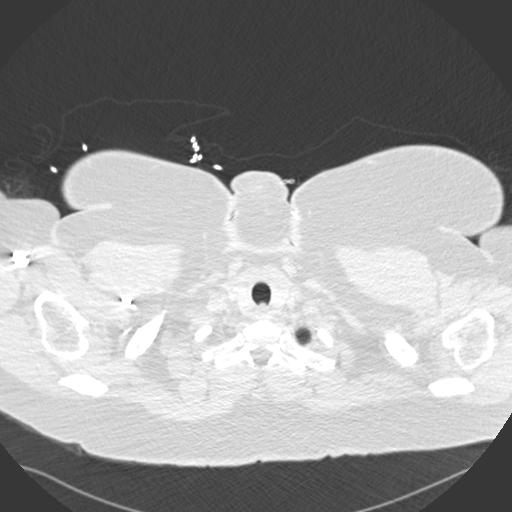
[im 353/372  mediastinal]
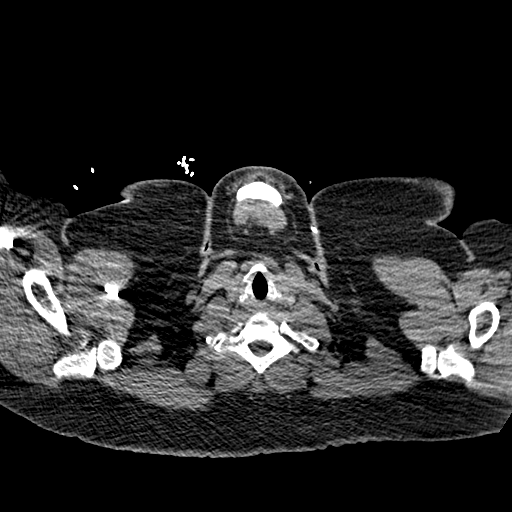

[19 of 36 positions shown; findings below may reference images not displayed]

FINDINGS: Cardiovascular: Intrathoracic aorta of normal caliber and
appearance. Partially visualized great vessels within normal limits.
Heart size normal. No pericardial effusion.

Pulmonary arterial tree adequately opacified for evaluation,
although evaluation for possible distal small emboli limited by body
habitus. Main pulmonary artery within normal limits for size. No
filling defect to suggest acute pulmonary embolism. Re-formatted
imaging confirms these findings.

Mediastinum/Nodes: Thyroid normal. No pathologically enlarged
mediastinal, hilar, or axillary lymph nodes identified. Esophagus
within normal limits. Small hiatal hernia noted.

Lungs/Pleura: Tracheobronchial tree is patent. Lungs are clear. No
focal infiltrates. No pulmonary edema or pleural effusion. No
pneumothorax. No worrisome pulmonary nodule or mass.

Upper Abdomen: Visualized upper abdomen within normal limits.

Musculoskeletal: No acute osseus abnormality. No worrisome lytic or
blastic osseous lesions.

Review of the MIP images confirms the above findings.
IMPRESSION: 1. No CT evidence for acute pulmonary embolism.
2. No other acute cardiopulmonary abnormality identified.
3. Small hiatal hernia.

## 2017-04-13 MED ORDER — IOPAMIDOL (ISOVUE-370) INJECTION 76%
100.0000 mL | Freq: Once | INTRAVENOUS | Status: AC | PRN
  Administered 2017-04-13: 100 mL via INTRAVENOUS

## 2017-04-13 MED ORDER — LORAZEPAM 2 MG/ML IJ SOLN
1.0000 mg | Freq: Once | INTRAMUSCULAR | Status: AC
  Administered 2017-04-13: 1 mg via INTRAVENOUS
  Filled 2017-04-13: qty 1

## 2017-04-13 MED ORDER — SODIUM CHLORIDE 0.9 % IV SOLN
Freq: Once | INTRAVENOUS | Status: AC
  Administered 2017-04-13: 03:00:00 via INTRAVENOUS

## 2017-04-13 NOTE — ED Notes (Signed)
MD with pt  

## 2017-04-13 NOTE — ED Notes (Signed)
To CT

## 2017-04-13 NOTE — ED Notes (Signed)
Returned from CT.

## 2017-04-13 NOTE — ED Notes (Signed)
Denies any complaints at present. Denies any sob. Sinus on monitor. Awaiting 2nd troponin result.

## 2017-04-13 NOTE — ED Notes (Signed)
Pt feeling anxious and increasing sob. 02 sats 100% on RA. MD aware and orders received.

## 2017-04-13 NOTE — ED Triage Notes (Addendum)
c/o chest pain that started around 35 minutes ago. States she had driven round trip from [place] today. c/o feeling sob. No distress on arrival. Resp even and unlabored.  Describes pain as sharp with inspiration. Denies any n/v. Denies any pain to lower extremities bilateral. Also c/o of feeling light headed on her drive home, but denies any present. Denies any recent illness other than seasonal allergies.

## 2017-04-13 NOTE — ED Provider Notes (Signed)
[place] Provider Note: Georgena Spurling, MD, FACEP  CSN: [id number] MRN: [id number] ARRIVAL: 04/13/17 at [place]: [place]  Chest Pain   HISTORY OF PRESENT ILLNESS  Krista Fisher is a 41 y.o. female who drove to [place] and back yesterday. She was wearing compression stockings for the trip. She is here with shortness of breath, lightheadedness and central pleuritic chest pain onset about 35 minutes ago. The onset was sudden. She rates her pain as a 4 out of 10. Shortness of breath is concerning her more than the pain. She denies diaphoresis or nausea. She is noted to be tachycardic. She denies lower extremity pain or swelling. She is having some paresthesias of the lower legs which is not uncommon for her.   Past Medical History:  Diagnosis Date  . GERD (gastroesophageal reflux disease)   . Hypertension     Past Surgical History:  Procedure Laterality Date  . CESAREAN SECTION    . CHOLECYSTECTOMY    . GASTRECTOMY    . GASTRIC BYPASS    . KNEE SURGERY    . WRIST FRACTURE SURGERY      Family History  Problem Relation Age of Onset  . Heart failure Father   . Diabetes Father     Social History  Substance Use Topics  . Smoking status: Never Smoker  . Smokeless tobacco: Never Used  . Alcohol use No    Prior to Admission medications   Medication Sig Start Date End Date Taking? Authorizing Provider  ALPRAZolam Duanne Moron) 1 MG tablet Take 1 mg by mouth at bedtime as needed for anxiety.   Yes Historical Provider, MD  cetirizine (ZYRTEC) 10 MG tablet Take 10 mg by mouth daily.   Yes Historical Provider, MD  buPROPion (WELLBUTRIN XL) 300 MG 24 hr tablet Take 300 mg by mouth daily.    Historical Provider, MD  levonorgestrel-ethinyl estradiol (ENPRESSE,TRIVORA) tablet Take 1 tablet by mouth daily.    Historical Provider, MD  OMEPRAZOLE PO Take by mouth.    Historical Provider, MD    Allergies Latex   REVIEW OF SYSTEMS  Negative except as  noted here or in the History of Present Illness.   PHYSICAL EXAMINATION  Initial Vital Signs Blood pressure (!) 150/82, pulse 95, temperature 98.2 F (36.8 C), temperature source Oral, resp. rate (!) 22, height 5\' 6"  (1.676 m), weight (!) 320 lb (145.2 kg), last menstrual period 03/21/2017, SpO2 99 %.  Examination General: Well-developed, well-nourished female in no acute distress; appearance consistent with age of record HENT: normocephalic; atraumatic Eyes: pupils equal, round and reactive to light; extraocular muscles intact Neck: supple Heart: regular rate and rhythm; no murmur; tachycardia Lungs: clear to auscultation bilaterally Abdomen: soft; nondistended; nontender; bowel sounds present Extremities: No deformity; full range of motion; pulses normal; nontender; trace edema of lower legs Neurologic: Awake, alert and oriented; motor function intact in all extremities and symmetric; no facial droop Skin: Warm and dry Psychiatric: Anxious   RESULTS  Summary of this visit's results, reviewed by myself:   EKG Interpretation  Date/Time:  Monday April 13 2017 02:18:41 EDT Ventricular Rate:  101 PR Interval:    QRS Duration: 117 QT Interval:  335 QTC Calculation: 435 R Axis:   76 Text Interpretation:  Sinus tachycardia First degree A-V block Low voltage, extremity and precordial leads Baseline wander in lead(s) II III aVF Rate is faster Confirmed by Zeek Rostron  MD, Jenny Reichmann ([id number]) on 04/13/2017 2:30:35 AM  Laboratory Studies: Results for orders placed or performed during the hospital encounter of 04/13/17 (from the past 24 hour(s))  CBC with Differential/Platelet     Status: Abnormal   Collection Time: 04/13/17  2:29 AM  Result Value Ref Range   WBC 12.4 (H) 4.0 - 10.5 K/uL   RBC 5.36 (H) 3.87 - 5.11 MIL/uL   Hemoglobin 13.6 12.0 - 15.0 g/dL   HCT 38.7 36.0 - 46.0 %   MCV 72.2 (L) 78.0 - 100.0 fL   MCH 25.4 (L) 26.0 - 34.0 pg   MCHC 35.1 30.0 - 36.0 g/dL   RDW 15.0 11.5 -  15.5 %   Platelets 334 150 - 400 K/uL   Neutrophils Relative % 66 %   Lymphocytes Relative 21 %   Monocytes Relative 11 %   Eosinophils Relative 2 %   Basophils Relative 0 %   Neutro Abs 8.2 (H) 1.7 - 7.7 K/uL   Lymphs Abs 2.6 0.7 - 4.0 K/uL   Monocytes Absolute 1.4 (H) 0.1 - 1.0 K/uL   Eosinophils Absolute 0.2 0.0 - 0.7 K/uL   Basophils Absolute 0.0 0.0 - 0.1 K/uL   RBC Morphology TEARDROP CELLS    WBC Morphology WHITE COUNT CONFIRMED ON SMEAR    Smear Review PLATELET COUNT CONFIRMED BY SMEAR   Basic metabolic panel     Status: Abnormal   Collection Time: 04/13/17  2:29 AM  Result Value Ref Range   Sodium 140 135 - 145 mmol/L   Potassium 3.5 3.5 - 5.1 mmol/L   Chloride 105 101 - 111 mmol/L   CO2 23 22 - 32 mmol/L   Glucose, Bld 129 (H) 65 - 99 mg/dL   BUN 10 6 - 20 mg/dL   Creatinine, Ser 1.13 (H) 0.44 - 1.00 mg/dL   Calcium 9.5 8.9 - 10.3 mg/dL   GFR calc non Af Amer 60 (L) >60 mL/min   GFR calc Af Amer >60 >60 mL/min   Anion gap 12 5 - 15  Troponin I     Status: None   Collection Time: 04/13/17  2:29 AM  Result Value Ref Range   Troponin I <0.03 <0.03 ng/mL  Troponin I     Status: None   Collection Time: 04/13/17  5:30 AM  Result Value Ref Range   Troponin I <0.03 <0.03 ng/mL   Imaging Studies: Ct Angio Chest Pe W Or Wo Contrast  Result Date: 04/13/2017 CLINICAL DATA:  Initial evaluation for acute pleuritic chest pain, shortness of breath. EXAM: CT ANGIOGRAPHY CHEST WITH CONTRAST TECHNIQUE: Multidetector CT imaging of the chest was performed using the standard protocol during bolus administration of intravenous contrast. Multiplanar CT image reconstructions and MIPs were obtained to evaluate the vascular anatomy. CONTRAST:  Under cc of Isovue 370. COMPARISON:  Prior radiograph from 01/25/2017. FINDINGS: Cardiovascular: Intrathoracic aorta of normal caliber and appearance. Partially visualized great vessels within normal limits. Heart size normal. No pericardial effusion.  Pulmonary arterial tree adequately opacified for evaluation, although evaluation for possible distal small emboli limited by body habitus. Main pulmonary artery within normal limits for size. No filling defect to suggest acute pulmonary embolism. Re-formatted imaging confirms these findings. Mediastinum/Nodes: Thyroid normal. No pathologically enlarged mediastinal, hilar, or axillary lymph nodes identified. Esophagus within normal limits. Small hiatal hernia noted. Lungs/Pleura: Tracheobronchial tree is patent. Lungs are clear. No focal infiltrates. No pulmonary edema or pleural effusion. No pneumothorax. No worrisome pulmonary nodule or mass. Upper Abdomen: Visualized upper abdomen within normal limits. Musculoskeletal: No acute  osseus abnormality. No worrisome lytic or blastic osseous lesions. Review of the MIP images confirms the above findings. IMPRESSION: 1. No CT evidence for acute pulmonary embolism. 2. No other acute cardiopulmonary abnormality identified. 3. Small hiatal hernia. Electronically Signed   By: Jeannine Boga M.D.   On: 04/13/2017 03:55    ED COURSE  Nursing notes and initial vitals signs, including pulse oximetry, reviewed.  Vitals:   04/13/17 0430 04/13/17 0500 04/13/17 0515 04/13/17 0530  BP: 119/71 104/69  120/75  Pulse: 81 86 78 74  Resp: 19 20 20 18   Temp:      TempSrc:      SpO2: 99% 100% 97% 98%  Weight:      Height:       4:17 AM Patient's chest pain and shortness of breath resolved after being given Ativan one milligram IV. She has been resting comfortably since. She was advised of negative CT scan. We will recheck troponin at the three-hour mark.  6:09 AM Serial troponins negative. I suspect patient's symptomatology had a significant anxiety component. She admits that she became "worked up" worrying about having a blood clot from her long trip yesterday.  PROCEDURES    ED DIAGNOSES     ICD-9-CM ICD-10-CM   1. Atypical chest pain 786.59 R07.89         Shanon Rosser, MD 04/13/17 [phone]

## 2017-10-01 LAB — HM COLONOSCOPY

## 2018-03-08 ENCOUNTER — Encounter: Payer: Self-pay | Admitting: Family Medicine

## 2018-03-08 ENCOUNTER — Ambulatory Visit: Payer: PRIVATE HEALTH INSURANCE | Admitting: Family Medicine

## 2018-03-08 VITALS — BP 128/76 | HR 78 | Temp 98.9°F | Ht 66.0 in | Wt 342.0 lb

## 2018-03-08 DIAGNOSIS — R200 Anesthesia of skin: Secondary | ICD-10-CM | POA: Diagnosis not present

## 2018-03-08 MED ORDER — PREDNISONE 5 MG PO TABS: ORAL_TABLET | ORAL | 0 refills | Status: DC

## 2018-03-08 NOTE — Progress Notes (Signed)
Krista Fisher - 42 y.o. female MRN [id number]  Date of birth: [birth year]  SUBJECTIVE:  Including CC & ROS.  Chief Complaint  Patient presents with  . Neck and shoulder pain    Krista Fisher is a 42 y.o. female that is presenting with arm numbness of the right arm. Pain is chronic in nature, ongoing for three years. She was getting adjustments from a chiropractor in 2016, which improved her pain. Pain is located at the base of skull on the right and radiates down but no specific dermatome. She describes the pain as an ache. Admits to tingling. Denies injury or surgery. Denies certain movements trigger the pain. She works as a Marine scientist.      Review of Systems  Constitutional: Negative for fever.  HENT: Negative for congestion.   Respiratory: Negative for cough.   Cardiovascular: Negative for chest pain.  Gastrointestinal: Negative for abdominal pain.  Musculoskeletal: Negative for joint swelling.  Skin: Negative for color change.  Allergic/Immunologic: Negative for immunocompromised state.  Neurological: Positive for numbness.  Hematological: Negative for adenopathy.  Psychiatric/Behavioral: Negative for agitation.    HISTORY: Past Medical, Surgical, Social, and Family History Reviewed & Updated per EMR.   Pertinent Historical Findings include:  Past Medical History:  Diagnosis Date  . GERD (gastroesophageal reflux disease)   . Hypertension     Past Surgical History:  Procedure Laterality Date  . CESAREAN SECTION    . CHOLECYSTECTOMY    . GASTRECTOMY    . GASTRIC BYPASS    . KNEE SURGERY    . WRIST FRACTURE SURGERY      Allergies  Allergen Reactions  . Latex     Family History  Problem Relation Age of Onset  . Heart failure Father   . Diabetes Father      Social History   Socioeconomic History  . Marital status: Single    Spouse name: Not on file  . Number of children: Not on file  . Years of education: Not on file  . Highest education level: Not on file   Social Needs  . Financial resource strain: Not on file  . Food insecurity - worry: Not on file  . Food insecurity - inability: Not on file  . Transportation needs - medical: Not on file  . Transportation needs - non-medical: Not on file  Occupational History  . Not on file  Tobacco Use  . Smoking status: Never Smoker  . Smokeless tobacco: Never Used  Substance and Sexual Activity  . Alcohol use: No  . Drug use: No  . Sexual activity: Not on file  Other Topics Concern  . Not on file  Social History Narrative  . Not on file     PHYSICAL EXAM:  VS: BP 128/76 (BP Location: Left Arm, Patient Position: Sitting, Cuff Size: Normal)   Pulse 78   Temp 98.9 F (37.2 C) (Oral)   Ht 5\' 6"  (1.676 m)   Wt (!) 342 lb (155.1 kg)   SpO2 98%   BMI 55.20 kg/m  Physical Exam Gen: NAD, alert, cooperative with exam, well-appearing ENT: normal lips, normal nasal mucosa,  Eye: normal EOM, normal conjunctiva and lids CV:  no edema, +2 pedal pulses   Resp: no accessory muscle use, non-labored,   Skin: no rashes, no areas of induration  Neuro: normal tone, normal sensation to touch Psych:  normal insight, alert and oriented MSK:  Right arm:  Normal active flexion and abduction  Normal ER  Normal  strength to resistance in IR and ER  Normal elbow ROM and strength to resistance  Normal Wrist ROm and strength to resistance  Normal finger adduction and abduction  Normal pincer grasp No signs of atrophy  Neurovascularly  Neck:  Normal ROM      ASSESSMENT & PLAN:   Arm numbness Doesn't follow a specific dermatome. Has intermittent symptoms of feeling the arm go "dead." Rotator cuff is strong. Possible for cervical radiculopathy.  - nerve conduction study  - prednisone  - could consider gabapentin

## 2018-03-08 NOTE — Assessment & Plan Note (Addendum)
Doesn't follow a specific dermatome. Has intermittent symptoms of feeling the arm go "dead." Rotator cuff is strong. Possible for cervical radiculopathy.  - nerve conduction study  - prednisone  - could consider gabapentin

## 2018-03-08 NOTE — Patient Instructions (Signed)
Please try the prednisone  They will call to get the nerve study conducted.  Please follow up with me if your symptoms change.

## 2018-03-16 ENCOUNTER — Ambulatory Visit (INDEPENDENT_AMBULATORY_CARE_PROVIDER_SITE_OTHER): Payer: PRIVATE HEALTH INSURANCE | Admitting: Neurology

## 2018-03-16 ENCOUNTER — Encounter: Payer: Self-pay | Admitting: Urgent Care

## 2018-03-16 ENCOUNTER — Ambulatory Visit (INDEPENDENT_AMBULATORY_CARE_PROVIDER_SITE_OTHER): Payer: No Typology Code available for payment source | Admitting: Urgent Care

## 2018-03-16 VITALS — BP 115/76 | HR 77 | Temp 99.1°F | Resp 18 | Ht 66.0 in | Wt 346.4 lb

## 2018-03-16 DIAGNOSIS — Z3049 Encounter for surveillance of other contraceptives: Secondary | ICD-10-CM | POA: Diagnosis not present

## 2018-03-16 DIAGNOSIS — E282 Polycystic ovarian syndrome: Secondary | ICD-10-CM | POA: Diagnosis not present

## 2018-03-16 DIAGNOSIS — R200 Anesthesia of skin: Secondary | ICD-10-CM | POA: Diagnosis not present

## 2018-03-16 DIAGNOSIS — G5601 Carpal tunnel syndrome, right upper limb: Secondary | ICD-10-CM

## 2018-03-16 DIAGNOSIS — M5412 Radiculopathy, cervical region: Secondary | ICD-10-CM

## 2018-03-16 LAB — POCT URINE PREGNANCY: Preg Test, Ur: NEGATIVE

## 2018-03-16 MED ORDER — LEVONORGESTREL-ETHINYL ESTRAD 0.1-20 MG-MCG PO TABS: 1.0000 | ORAL_TABLET | Freq: Every day | ORAL | 3 refills | Status: DC

## 2018-03-16 NOTE — Procedures (Signed)
[facility]  [address]  [place], [place] [address] Tel: [phone] Fax:  [phone] Test Date:  03/16/2018  Patient: Krista Fisher DOB: 02-15-1976 Physician: Narda Amber, DO  Sex: Female Height: 5\' 6"  Ref Phys: Clearance Coots, MD  ID#: [id number] Temp: 35.0C Technician:    Patient Complaints: This is a 42 year-old female with right arm numbness.  NCV & EMG Findings: Extensive electrodiagnostic testing of the right upper extremity shows:  1. Right mixed palmar sensory response shows absolute prolonged latency. Right median and ulnar sensory responses are within normal limits. 2. Right median nerve response shows greater proximal response as well as a motor response when stimulating at the ulnar wrist, consistent with a Martin-Gruber anastomosis, a normal variant. Right ulnar motor nerve is within normal limits. 3. Chronic motor axon loss changes are seen affecting the right biceps and deltoid muscles, without accompanied active denervation.  Impression: 1. Chronic C5-6 radiculopathy affecting the right upper extremity, mild in degree electrically. 2. Right median neuropathy at or distal to the wrist, consistent with the clinical diagnosis of carpal tunnel syndrome. Overall, these findings are very mild in degree electrically. 3. Incidentally, there is a right Martin-Gruber anastomosis, a normal variant.   ___________________________ Narda Amber, DO    Nerve Conduction Studies Anti Sensory Summary Table   Stim Site NR Peak (ms) Norm Peak (ms) P-T Amp (V) Norm P-T Amp  Right Median Anti Sensory (2nd Digit)  35C  Wrist    3.0 <3.4 36.1 >20  Right Ulnar Anti Sensory (5th Digit)  35C  Wrist    2.4 <3.1 27.3 >12   Motor Summary Table   Stim Site NR Onset (ms) Norm Onset (ms) O-P Amp (mV) Norm O-P Amp Site1 Site2 Delta-0 (ms) Dist (cm) Vel (m/s) Norm Vel (m/s)  Right Median Motor (Abd Poll Brev)  35C  Wrist    3.0 <3.9 8.2 >6 Elbow Wrist 4.7  31.0 66 >50  Elbow    7.7  9.5  Ulnar-wrist crossover Elbow 4.0 0.0    Ulnar-wrist crossover    3.7  4.5         Right Ulnar Motor (Abd Dig Minimi)  35C  Wrist    2.3 <3.1 10.5 >7 B Elbow Wrist 3.6 29.5 82 >50  B Elbow    5.9  10.4  A Elbow B Elbow 1.4 10.0 71 >50  A Elbow    7.3  10.3          Comparison Summary Table   Stim Site NR Peak (ms) Norm Peak (ms) P-T Amp (V) Site1 Site2 Delta-P (ms) Norm Delta (ms)  Right Median/Ulnar Palm Comparison (Wrist - 8cm)  35C  Median Palm    1.8 <2.2 62.4 Median Palm Ulnar Palm 0.5   Ulnar Palm    1.3 <2.2 11.4       EMG   Side Muscle Ins Act Fibs Psw Fasc Number Recrt Dur Dur. Amp Amp. Poly Poly. Comment  Right 1stDorInt Nml Nml Nml Nml Nml Nml Nml Nml Nml Nml Nml Nml N/A  Right PronatorTeres Nml Nml Nml Nml Nml Nml Nml Nml Nml Nml Nml Nml N/A  Right Biceps Nml Nml Nml Nml 1- Rapid Few 1+ Few 1+ Nml Nml N/A  Right Triceps Nml Nml Nml Nml Nml Nml Nml Nml Nml Nml Nml Nml N/A  Right Deltoid Nml Nml Nml Nml 1- Rapid Few 1+ Few 1+ Nml Nml N/A      Waveforms:

## 2018-03-16 NOTE — Progress Notes (Signed)
   MRN: [id number] DOB: [birth year]  Subjective:   Krista Fisher is a 42 y.o. female presenting for refills on her [place]. Had her pap smear done in 2017, was normal. Patient has PCOS, does well with Lutera. Denies chest pain, abdominal pain, abnormal vaginal bleeding. Denies smoking cigarettes.  Krista Fisher has a current medication list which includes the following prescription(s): alprazolam, cetirizine, escitalopram, levonorgestrel-ethinyl estradiol, omeprazole, and prednisone. Also is allergic to latex.  Krista Fisher  has a past medical history of GERD (gastroesophageal reflux disease) and Hypertension. Also  has a past surgical history that includes Cholecystectomy; Gastrectomy; Cesarean section; Wrist fracture surgery; Knee surgery; and Gastric bypass. Her family history includes Diabetes in her father; Heart failure in her father.   Objective:   Vitals: BP 115/76   Pulse 77   Temp 99.1 F (37.3 C) (Oral)   Resp 18   Ht 5\' 6"  (1.676 m)   Wt (!) 346 lb 6.4 oz (157.1 kg)   SpO2 97%   BMI 55.91 kg/m   Physical Exam  Constitutional: She is oriented to person, place, and time. She appears well-developed and well-nourished.  Cardiovascular: Normal rate.  Pulmonary/Chest: Effort normal.  Neurological: She is alert and oriented to person, place, and time.  Psychiatric: She has a normal mood and affect.   Results for orders placed or performed in visit on 03/16/18 (from the past 24 hour(s))  POCT urine pregnancy     Status: None   Collection Time: 03/16/18 10:12 AM  Result Value Ref Range   Preg Test, Ur Negative Negative   Assessment and Plan :   Encounter for surveillance of other contraceptive - Plan: POCT urine pregnancy  PCOS (polycystic ovarian syndrome)  Refilled Lutera for up to 1 year. Patient is not due for pap smear at this time, has negative pregnancy test. Counseled patient on potential for adverse effects with medications prescribed today, patient verbalized understanding.     Jaynee Eagles, PA-C [facility] [phone] 03/16/2018  10:07 AM

## 2018-03-16 NOTE — Patient Instructions (Signed)
Ethinyl Estradiol; Levonorgestrel tablets What is this medicine? ETHINYL ESTRADIOL; LEVONORGESTREL (ETH in il es tra DYE ole; LEE voh nor jes trel) is an oral contraceptive. It combines two types of female hormones, an estrogen and a progestin. They are used to prevent ovulation and pregnancy. This medicine may be used for other purposes; ask your health care provider or pharmacist if you have questions. COMMON BRAND NAME(S): [place], [place], Amethia, Amethia Lo, [place], [place], Aubra-28, Aviane, Camrese, Camrese Lo, [place], [place], [place], [place], [place], [place], [place], [place], [place], [place], [place], [place], [place], [place], Levonorgestrel/Ethinyl Estradiol, [place], [place], [place], [place], [place], [place], [place], [place], [place], [place], [place], [place], [place], [place], [place], [place], [place], Reinaldo Berber What should I tell my health care provider before I take this medicine? They need to know if you have or ever had any of these conditions: -abnormal vaginal bleeding -blood vessel disease or blood clots -breast, cervical, endometrial, ovarian, liver, or uterine cancer -diabetes -gallbladder disease -heart disease or recent heart attack -high blood pressure -high cholesterol -kidney disease -liver disease -migraine headaches -stroke -systemic lupus erythematosus (SLE) -tobacco smoker -an unusual or allergic reaction to estrogens, progestins, other medicines, foods, dyes, or preservatives -pregnant or trying to get pregnant -breast-feeding How should I use this medicine? Take this medicine by mouth. To reduce nausea, this medicine may be taken with food. Follow the directions on the prescription label. Take this medicine at the same time each day and in the order directed on the package. Do not take your medicine more often than directed. Contact your pediatrician regarding the use of this medicine in children. Special care may be  needed. This medicine has been used in female children who have started having menstrual periods. A patient package insert for the product will be given with each prescription and refill. Read this sheet carefully each time. The sheet may change frequently. Overdosage: If you think you have taken too much of this medicine contact a poison control center or emergency room at once. NOTE: This medicine is only for you. Do not share this medicine with others. What if I miss a dose? If you miss a dose, refer to the patient information sheet you received with your medicine for direction. If you miss more than one pill, this medicine may not be as effective and you may need to use another form of birth control. What may interact with this medicine? Do not take this medicine with the following medication: -dasabuvir; ombitasvir; paritaprevir; ritonavir -ombitasvir; paritaprevir; ritonavir This medicine may also interact with the following medications: -acetaminophen -antibiotics or medicines for infections, especially rifampin, rifabutin, rifapentine, and griseofulvin, and possibly penicillins or tetracyclines -aprepitant -ascorbic acid (vitamin C) -atorvastatin -barbiturate medicines, such as phenobarbital -bosentan -carbamazepine -caffeine -clofibrate -cyclosporine -dantrolene -doxercalciferol -felbamate -grapefruit juice -hydrocortisone -medicines for anxiety or sleeping problems, such as diazepam or temazepam -medicines for diabetes, including pioglitazone -mineral oil -modafinil -mycophenolate -nefazodone -oxcarbazepine -phenytoin -prednisolone -ritonavir or other medicines for HIV infection or AIDS -rosuvastatin -selegiline -soy isoflavones supplements -St. John's wort -tamoxifen or raloxifene -theophylline -thyroid hormones -topiramate -warfarin This list may not describe all possible interactions. Give your health care provider a list of all the medicines, herbs,  non-prescription drugs, or dietary supplements you use. Also tell them if you smoke, drink alcohol, or use illegal drugs. Some items may interact with your medicine. What should I watch for while using this medicine? Visit your doctor or health care professional for regular checks on your progress. You will need a regular breast and pelvic  exam and Pap smear while on this medicine. Use an additional method of contraception during the first cycle that you take these tablets. If you have any reason to think you are pregnant, stop taking this medicine right away and contact your doctor or health care professional. If you are taking this medicine for hormone related problems, it may take several cycles of use to see improvement in your condition. Smoking increases the risk of getting a blood clot or having a stroke while you are taking birth control pills, especially if you are more than 43 years old. You are strongly advised not to smoke. This medicine can make your body retain fluid, making your fingers, hands, or ankles swell. Your blood pressure can go up. Contact your doctor or health care professional if you feel you are retaining fluid. This medicine can make you more sensitive to the sun. Keep out of the sun. If you cannot avoid being in the sun, wear protective clothing and use sunscreen. Do not use sun lamps or tanning beds/booths. If you wear contact lenses and notice visual changes, or if the lenses begin to feel uncomfortable, consult your eye care specialist. In some women, tenderness, swelling, or minor bleeding of the gums may occur. Notify your dentist if this happens. Brushing and flossing your teeth regularly may help limit this. See your dentist regularly and inform your dentist of the medicines you are taking. If you are going to have elective surgery, you may need to stop taking this medicine before the surgery. Consult your health care professional for advice. This medicine does not  protect you against HIV infection (AIDS) or any other sexually transmitted diseases. What side effects may I notice from receiving this medicine? Side effects that you should report to your doctor or health care professional as soon as possible: -breast tissue changes or discharge -changes in vaginal bleeding during your period or between your periods -chest pain -coughing up blood -dizziness or fainting spells -headaches or migraines -leg, arm or groin pain -severe or sudden headaches -stomach pain (severe) -sudden shortness of breath -sudden loss of coordination, especially on one side of the body -speech problems -symptoms of vaginal infection like itching, irritation or unusual discharge -tenderness in the upper abdomen -vomiting -weakness or numbness in the arms or legs, especially on one side of the body -yellowing of the eyes or skin Side effects that usually do not require medical attention (report to your doctor or health care professional if they continue or are bothersome): -breakthrough bleeding and spotting that continues beyond the 3 initial cycles of pills -breast tenderness -mood changes, anxiety, depression, frustration, anger, or emotional outbursts -increased sensitivity to sun or ultraviolet light -nausea -skin rash, acne, or brown spots on the skin -weight gain (slight) This list may not describe all possible side effects. Call your doctor for medical advice about side effects. You may report side effects to FDA at 1-800-FDA-1088. Where should I keep my medicine? Keep out of the reach of children. Store at room temperature between 15 and 30 degrees C (59 and 86 degrees F). Throw away any unused medicine after the expiration date. NOTE: This sheet is a summary. It may not cover all possible information. If you have questions about this medicine, talk to your doctor, pharmacist, or health care provider.  2018 Elsevier/Gold Standard (2016-08-18  07:58:22)     IF you received an x-ray today, you will receive an invoice from [facility]. Please contact [facility] at [phone] with questions  or concerns regarding your invoice.   IF you received labwork today, you will receive an invoice from [facility]. Please contact [facility] at [phone] with questions or concerns regarding your invoice.   Our billing staff will not be able to assist you with questions regarding bills from these companies.  You will be contacted with the lab results as soon as they are available. The fastest way to get your results is to activate your My Chart account. Instructions are located on the last page of this paperwork. If you have not heard from [place] regarding the results in 2 weeks, please contact this office.

## 2018-03-17 ENCOUNTER — Telehealth: Payer: Self-pay | Admitting: Family Medicine

## 2018-03-17 NOTE — Telephone Encounter (Signed)
Unable to leave VM for patient. If she calls back please have her speak with a nurse/CMA and inform that her EMG shows some nerve impingement. We can consider an epidural if she would like to try that if still having pain.   If any questions then please take the best time and phone number to call and I will try to call her back.   Rosemarie Ax, MD [facility] 03/17/2018, 12:24 PM

## 2018-04-07 ENCOUNTER — Telehealth: Payer: Self-pay | Admitting: Neurology

## 2018-04-07 NOTE — Telephone Encounter (Signed)
Patient called and needed to check the status of her test results. Please Call. Thanks

## 2018-04-08 NOTE — Telephone Encounter (Signed)
I called patient back and let her know that I would be faxing the results to Dr. Raeford Razor' office and instructed her to call them.

## 2018-04-12 ENCOUNTER — Telehealth: Payer: Self-pay | Admitting: Family Medicine

## 2018-04-12 NOTE — Telephone Encounter (Signed)
Spoke with patient about her EMG. She will try physical therapy at work.   Rosemarie Ax, MD [facility] 04/12/2018, 1:55 PM

## 2018-04-20 ENCOUNTER — Ambulatory Visit (INDEPENDENT_AMBULATORY_CARE_PROVIDER_SITE_OTHER): Payer: Self-pay | Admitting: Orthopaedic Surgery

## 2018-05-04 ENCOUNTER — Encounter

## 2018-05-04 ENCOUNTER — Ambulatory Visit: Payer: Self-pay | Admitting: Neurology

## 2018-05-06 ENCOUNTER — Encounter: Payer: Self-pay | Admitting: Urgent Care

## 2018-05-06 ENCOUNTER — Ambulatory Visit: Payer: No Typology Code available for payment source | Admitting: Urgent Care

## 2018-05-06 VITALS — BP 138/60 | HR 99 | Temp 98.6°F | Resp 18 | Ht 66.0 in | Wt 352.0 lb

## 2018-05-06 DIAGNOSIS — K219 Gastro-esophageal reflux disease without esophagitis: Secondary | ICD-10-CM

## 2018-05-06 DIAGNOSIS — R7989 Other specified abnormal findings of blood chemistry: Secondary | ICD-10-CM | POA: Diagnosis not present

## 2018-05-06 MED ORDER — OMEPRAZOLE 20 MG PO CPDR: 20.0000 mg | DELAYED_RELEASE_CAPSULE | Freq: Every day | ORAL | 3 refills | Status: DC

## 2018-05-06 NOTE — Progress Notes (Signed)
   MRN: [id number] DOB: 1976-01-09  Subjective:   Krista Fisher is a 42 y.o. female presenting for weight loss counseling.  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  Admits that she has a difficult time making healthy diet choices due to emotional eating and the nature of her work does not make it easier for her to exercise.  She is requesting trying phentermine.  She has not previously tried any medications for weight loss.  She is currently trying weight watchers.  She would also like refill on her omeprazole.  States that she does well with this.  She also takes Lexapro.  She has a history of diabetes but is not taking any medication for this currently. She has ~1 drink of alcohol per week.  Krista Fisher has a current medication list which includes the following prescription(s): alprazolam, cetirizine, escitalopram, levonorgestrel-ethinyl estradiol, and omeprazole. Also is allergic to latex.  Krista Fisher  has a past medical history of GERD (gastroesophageal reflux disease) and Hypertension. Also  has a past surgical history that includes Cholecystectomy; Gastrectomy; Cesarean section; Wrist fracture surgery; Knee surgery; and Gastric bypass.  Objective:   Vitals: BP 138/60   Pulse 99   Temp 98.6 F (37 C) (Oral)   Resp 18   Ht 5\' 6"  (1.676 m)   Wt (!) 352 lb (159.7 kg)   SpO2 96%   BMI 56.81 kg/m   Physical Exam  Constitutional: She is oriented to person, place, and time. She appears well-developed and well-nourished.  Cardiovascular: Normal rate.  Pulmonary/Chest: Effort normal.  Neurological: She is alert and oriented to person, place, and time.  Psychiatric: She has a normal mood and affect. Her mood appears not anxious. Her affect is not angry, not blunt and not labile. Her speech is not rapid and/or pressured, not delayed, not tangential and not slurred. She is not agitated, not aggressive and not slowed. She does not exhibit a depressed mood.   Assessment and  Plan :   Gastroesophageal reflux disease, esophagitis presence not specified  Morbid obesity ([place]) - Plan: Thyroid Panel With TSH, Hemoglobin A1c, Amb Referral to Bariatric Surgery  Elevated serum creatinine - Plan: Comprehensive metabolic panel  I refilled patient's omeprazole.  We had an extensive conversation about weight loss.  I counseled that it would be more viable long-term for her to try dietary modifications and counseling.  I will refer to bariatrics with [facility].  We also discussed possibility of using Contrave.  Labs pending.  Krista Eagles, PA-C [facility] at [facility] [phone] 05/06/2018  1:34 PM

## 2018-05-06 NOTE — Patient Instructions (Addendum)
Salads - kale, spinach, cabbage, spring mix; use seeds like pumpkin seeds or sunflower seeds; you can also use 1-2 hard boiled eggs. Fruits - avocadoes, berries (blueberries, raspberries, blackberries), apples, oranges, pomegranate, grapefruit Seeds - quinoa, chia seeds; you can also incorporate oatmeal Vegetables - aspargus, cauliflower, broccoli, green beans, brussel spouts, bell peppers; stay away from starchy vegetables like potatoes, carrots, peas  Brussel sprouts - Cut off stems. Place in a mixing bowl that has a lid. Pour in a 1/4-1/2 cup olive oil, spices, use a light amount of parmesan. Place on a baking sheet. Bake for 10 minutes at 400F. Take it out, eat the brussel chips. Place for another 5-10 minutes.   Mashed cauliflower - Boil a bunch of cauliflower in a pot of water. Blend in a food processor with 1-2 tablespoons of butter.  Spaghetti squash -  Cut the squash in half very carefully, clean out seeds from the middle. Place 1/2 face down in a microwave safe dish with at least 2 inches of water. Make 4-6 slits on outside of spaghetti squash and microwave for 10-12 minutes. Take out the spaghetti using a metal spoon. Repeat for the other half.   Vega protein is good protein powder, make sure you use ~6 ice cubes to give it smoothie consistency together with ~4-6 ounces of vanilla soy milk. Throw cinnamon into your shake, use peanut butter. You can also use the fruits that I listed above. Throw spinach or kale into the shake.    Please make sure that you are eating less or limit your carbohydrates such as white rice, potatoes, white breads, pasta, pizza, sodas, beer, sweet tea, fruit juices. Exercise can also help with your blood sugar. You can instead eat more salads, fruits, vegetables, whole grains, wheat, oats.      Bupropion; Naltrexone extended-release tablets What is this medicine? BUPROPION; NALTREXONE (byoo PROE pee on; nal TREX one) is a combination product used to promote  and maintain weight loss in obese adults or overweight adults who also have weight related medical problems. This medicine should be used with a reduced calorie diet and increased physical activity. This medicine may be used for other purposes; ask your health care provider or pharmacist if you have questions. COMMON BRAND NAME(S): CONTRAVE What should I tell my health care provider before I take this medicine? They need to know if you have any of these conditions: -an eating disorder, such as anorexia or bulimia -bipolar disorder -diabetes -depression -drug abuse or addiction -glaucoma -head injury -heart disease -high blood pressure -history of a tumor or infection of your brain or spine -history of stroke -history of irregular heartbeat -if you often drink alcohol -kidney disease -liver disease -schizophrenia -seizures -suicidal thoughts, plans, or attempt; a previous suicide attempt by you or a family member -an unusual or allergic reaction to bupropion, naltrexone, other medicines, foods, dyes, or preservatives -breast-feeding -pregnant or trying to become pregnant How should I use this medicine? Take this medicine by mouth with a glass of water. Follow the directions on the prescription label. Take this medicine in the morning and in the evenings as directed by your healthcare professional. Dennis Bast can take it with or without food. Do not take with high-fat meals as this may increase your risk of seizures. Do not crush, chew, or cut these tablets. Do not take your medicine more often than directed. Do not stop taking this medicine suddenly except upon the advice of your doctor. A special MedGuide will be given  to you by the pharmacist with each prescription and refill. Be sure to read this information carefully each time. Talk to your pediatrician regarding the use of this medicine in children. Special care may be needed. Overdosage: If you think you have taken too much of this  medicine contact a poison control center or emergency room at once. NOTE: This medicine is only for you. Do not share this medicine with others. What if I miss a dose? If you miss a dose, skip the missed dose and take your next tablet at the regular time. Do not take double or extra doses. What may interact with this medicine? Do not take this medicine with any of the following medications: -any prescription or street opioid drug like codeine, heroin, methadone -linezolid -MAOIs like Carbex, Eldepryl, Marplan, Nardil, and Parnate -methylene blue (injected into a vein) -other medicines that contain bupropion like Zyban or Wellbutrin This medicine may also interact with the following medications: -alcohol -certain medicines for anxiety or sleep -certain medicines for blood pressure like metoprolol, propranolol -certain medicines for depression or psychotic disturbances -certain medicines for HIV or AIDS like efavirenz, lopinavir, nelfinavir, ritonavir -certain medicines for irregular heart beat like propafenone, flecainide -certain medicines for Parkinson's disease like amantadine, levodopa -certain medicines for seizures like carbamazepine, phenytoin, phenobarbital -cimetidine -clopidogrel -cyclophosphamide -digoxin -disulfiram -furazolidone -isoniazid -nicotine -orphenadrine -procarbazine -steroid medicines like prednisone or cortisone -stimulant medicines for attention disorders, weight loss, or to stay awake -tamoxifen -theophylline -thioridazine -thiotepa -ticlopidine -tramadol -warfarin This list may not describe all possible interactions. Give your health care provider a list of all the medicines, herbs, non-prescription drugs, or dietary supplements you use. Also tell them if you smoke, drink alcohol, or use illegal drugs. Some items may interact with your medicine. What should I watch for while using this medicine? This medicine is intended to be used in addition to a  healthy diet and appropriate exercise. The best results are achieved this way. Do not increase or in any way change your dose without consulting your doctor or health care professional. Do not take this medicine with other prescription or over-the-counter weight loss products without consulting your doctor or health care professional. Your doctor should tell you to stop taking this medicine if you do not lose a certain amount of weight within the first 12 weeks of treatment. Visit your doctor or health care professional for regular checkups. Your doctor may order blood tests or other tests to see how you are doing. This medicine may affect blood sugar levels. If you have diabetes, check with your doctor or health care professional before you change your diet or the dose of your diabetic medicine. Patients and their families should watch out for new or worsening depression or thoughts of suicide. Also watch out for sudden changes in feelings such as feeling anxious, agitated, panicky, irritable, hostile, aggressive, impulsive, severely restless, overly excited and hyperactive, or not being able to sleep. If this happens, especially at the beginning of treatment or after a change in dose, call your health care professional. Avoid alcoholic drinks while taking this medicine. Drinking large amounts of alcoholic beverages, using sleeping or anxiety medicines, or quickly stopping the use of these agents while taking this medicine may increase your risk for a seizure. What side effects may I notice from receiving this medicine? Side effects that you should report to your doctor or health care professional as soon as possible: -allergic reactions like skin rash, itching or hives, swelling of the face, lips,  or tongue -breathing problems -changes in vision -confusion -elevated mood, decreased need for sleep, racing thoughts, impulsive behavior -fast or irregular heartbeat -hallucinations, loss of contact with  reality -increased blood pressure -redness, blistering, peeling or loosening of the skin, including inside the mouth -seizures -signs and symptoms of liver injury like dark yellow or brown urine; general ill feeling or flu-like symptoms; light-colored stools; loss of appetite; nausea; right upper belly pain; unusually weak or tired; yellowing of the eyes or skin -suicidal thoughts or other mood changes -vomiting Side effects that usually do not require medical attention (report to your doctor or health care professional if they continue or are bothersome): -constipation -headache -loss of appetite -indigestion, stomach upset -tremors This list may not describe all possible side effects. Call your doctor for medical advice about side effects. You may report side effects to FDA at 1-800-FDA-1088. Where should I keep my medicine? Keep out of the reach of children. Store at room temperature between 15 and 30 degrees C (59 and 86 degrees F). Throw away any unused medicine after the expiration date. NOTE: This sheet is a summary. It may not cover all possible information. If you have questions about this medicine, talk to your doctor, pharmacist, or health care provider.  2018 Elsevier/Gold Standard (2016-05-30 13:42:58)     IF you received an x-ray today, you will receive an invoice from [facility]. Please contact [facility] at [phone] with questions or concerns regarding your invoice.   IF you received labwork today, you will receive an invoice from [facility]. Please contact [facility] at [phone] with questions or concerns regarding your invoice.   Our billing staff will not be able to assist you with questions regarding bills from these companies.  You will be contacted with the lab results as soon as they are available. The fastest way to get your results is to activate your My Chart account. Instructions are located on the last page of this paperwork. If you  have not heard from [place] regarding the results in 2 weeks, please contact this office.

## 2018-05-07 LAB — THYROID PANEL WITH TSH
Free Thyroxine Index: 1.6 (ref 1.2–4.9)
T3 Uptake Ratio: 21 % — ABNORMAL LOW (ref 24–39)
T4, Total: 7.6 ug/dL (ref 4.5–12.0)
TSH: 3.73 u[IU]/mL (ref 0.450–4.500)

## 2018-05-07 LAB — HEMOGLOBIN A1C
Est. average glucose Bld gHb Est-mCnc: 108 mg/dL
Hgb A1c MFr Bld: 5.4 % (ref 4.8–5.6)

## 2018-05-07 LAB — COMPREHENSIVE METABOLIC PANEL
ALT: 14 IU/L (ref 0–32)
AST: 19 IU/L (ref 0–40)
Albumin/Globulin Ratio: 1.2 (ref 1.2–2.2)
Albumin: 3.8 g/dL (ref 3.5–5.5)
Alkaline Phosphatase: 72 IU/L (ref 39–117)
BUN/Creatinine Ratio: 11 (ref 9–23)
BUN: 11 mg/dL (ref 6–24)
Bilirubin Total: 0.2 mg/dL (ref 0.0–1.2)
CO2: 20 mmol/L (ref 20–29)
Calcium: 8.8 mg/dL (ref 8.7–10.2)
Chloride: 106 mmol/L (ref 96–106)
Creatinine, Ser: 1.02 mg/dL — ABNORMAL HIGH (ref 0.57–1.00)
GFR calc Af Amer: 78 mL/min/{1.73_m2} (ref >59)
GFR calc non Af Amer: 68 mL/min/{1.73_m2} (ref >59)
Globulin, Total: 3.1 g/dL (ref 1.5–4.5)
Glucose: 93 mg/dL (ref 65–99)
Potassium: 4.2 mmol/L (ref 3.5–5.2)
Sodium: 141 mmol/L (ref 134–144)
Total Protein: 6.9 g/dL (ref 6.0–8.5)

## 2018-08-17 ENCOUNTER — Encounter: Payer: Self-pay | Admitting: Nurse Practitioner

## 2018-08-17 DIAGNOSIS — H8113 Benign paroxysmal vertigo, bilateral: Secondary | ICD-10-CM

## 2018-08-17 MED ORDER — MECLIZINE HCL 25 MG PO TABS
25.0000 mg | ORAL_TABLET | Freq: Three times a day (TID) | ORAL | 0 refills | Status: DC | PRN
Start: 2018-08-17 — End: 2019-12-06

## 2018-08-17 NOTE — Progress Notes (Signed)
Seen at work site clinic today. SOAP on paper chart. Rx faxed to pharmacy.

## 2018-08-24 ENCOUNTER — Encounter (INDEPENDENT_AMBULATORY_CARE_PROVIDER_SITE_OTHER): Payer: No Typology Code available for payment source

## 2018-08-31 ENCOUNTER — Ambulatory Visit (INDEPENDENT_AMBULATORY_CARE_PROVIDER_SITE_OTHER): Payer: No Typology Code available for payment source | Admitting: Bariatrics

## 2018-08-31 ENCOUNTER — Encounter (INDEPENDENT_AMBULATORY_CARE_PROVIDER_SITE_OTHER): Payer: Self-pay

## 2019-12-06 ENCOUNTER — Ambulatory Visit: Payer: Self-pay | Admitting: Family Medicine

## 2019-12-06 ENCOUNTER — Encounter: Payer: Self-pay | Admitting: Family Medicine

## 2019-12-06 NOTE — Progress Notes (Signed)
Subjective:     Patient ID: Krista Fisher, female   DOB: [birth year], 43 y.o.   MRN: [id number]  HPI Krista Fisher presents to the [facility] clinic today for weight loss supervision program. She needs a form filled out and to be weighed. We also reviewed her health history and wellness.  This is her third month in weight watchers, she is using the app to log points and doing well with this. She states she struggles with emotional eating and has been very stressed and overwhelmed with work, so this has been challenging to overcome. She reports she feels like she is handling her stress better and is seeing a therapist over the last month which has helped. She continues her lexapro daily and is on xanax prn, which she reports rare use. The points system with weight watchers has been working well for her. She is cooking more meals at home. She states she hasn't been exercising as much as she would like, she is walking the dog about 20 min everyday but would like to do more. She has a stationary bike at home and access to exercise videos that she wants to utilize more regularly.   Her PCP is Willey Blade, NP. She last saw her in November 2020. Lab work has been done and is normal per patient. No hx of elevated cholesterol or diabetes.   Health maintenance items UTD. Mammogram last in 2018, normal, will have next one done in January per patient, no immediate family hx of breast cancer. Pap smear done in 2018, normal, no hx of abnormals. Hx of HTN but is controlled now without medications.  Periods are regular, she is taking COC's to help with pain from PCOS and for birth control. Discussed increased risk of blood clots d/t age, may consider alternative birth control options in the future, discuss with PCP.   Allergies  Allergen Reactions  . Latex    Past Medical History:  Diagnosis Date  . GERD (gastroesophageal reflux disease)   . Hypertension     Current Outpatient Medications:  .  ALPRAZolam (XANAX) 1 MG  tablet, Take 1 mg by mouth at bedtime as needed for anxiety., Disp: , Rfl:  .  cetirizine (ZYRTEC) 10 MG tablet, Take 10 mg by mouth daily., Disp: , Rfl:  .  escitalopram (LEXAPRO) 20 MG tablet, Take 20 mg by mouth daily., Disp: , Rfl:  .  levonorgestrel-ethinyl estradiol (LUTERA) 0.1-20 MG-MCG tablet, Take 1 tablet by mouth daily., Disp: 3 Package, Rfl: 3 .  omeprazole (PRILOSEC) 20 MG capsule, Take 1 capsule (20 mg total) by mouth daily. (Patient taking differently: Take 40 mg by mouth daily. ), Disp: 90 capsule, Rfl: 3  Review of Systems  Constitutional: Negative for chills, fever and unexpected weight change.  Respiratory: Negative for chest tightness and shortness of breath.   Cardiovascular: Negative for chest pain.       Objective:   Physical Exam Vitals reviewed.  Constitutional:      General: She is not in acute distress.    Appearance: Normal appearance. She is obese. She is not toxic-appearing.  HENT:     Head: Normocephalic and atraumatic.  Cardiovascular:     Rate and Rhythm: Normal rate and regular rhythm.     Heart sounds: Normal heart sounds.  Pulmonary:     Effort: Pulmonary effort is normal. No respiratory distress.     Breath sounds: Normal breath sounds.  Skin:    General: Skin is warm and dry.  Neurological:  Mental Status: She is alert and oriented to person, place, and time.  Psychiatric:        Mood and Affect: Mood normal.        Behavior: Behavior normal.    Vitals:   12/06/19 1115  BP: 120/80  Height: 5\' 6"  (1.676 m)  Weight: (!) 342 lb (155.1 kg)  BMI (Calculated): 55.23      Assessment:     Morbid obesity ([place]), Chronic      Plan:     1. Monitored weight loss, down 2 lbs since last weight check 1 month ago. Doing well with weight watchers, she will continue using this point system. Encouraged meal planning, grocery shopping from a list, not having tempting foods around the house. Will continue walking daily but add in more exercise  such as stationary bike or exercise videos. Continue with therapy and stress relief measures. F/u prn

## 2019-12-06 NOTE — Progress Notes (Deleted)
3rd month of supervised weight loss therapy. Weight watchers is working well - logging points Burned out Not focused on exercise as much  Videos at home, stationary bike at home [employer] dog 20 min - daily Stress relief - music, talking to a therapist last month (handling better)  PCP: Willey Blade, NP at [facility] November 2020 - labs done   Regular periods: on birth control helps with pain Pap smear due next year - normal  Mammogram: 2018 normal no immediate family hx of breast cancer Hx of htn controlled

## 2020-01-03 NOTE — Patient Instructions (Signed)

## 2020-01-16 ENCOUNTER — Other Ambulatory Visit: Payer: Self-pay | Admitting: Neurology

## 2020-01-17 ENCOUNTER — Ambulatory Visit: Payer: No Typology Code available for payment source | Admitting: Neurology

## 2020-01-17 ENCOUNTER — Ambulatory Visit: Payer: Self-pay | Admitting: Family Medicine

## 2020-01-17 ENCOUNTER — Encounter: Payer: Self-pay | Admitting: Neurology

## 2020-01-17 ENCOUNTER — Encounter: Payer: Self-pay | Admitting: Family Medicine

## 2020-01-17 ENCOUNTER — Other Ambulatory Visit: Payer: Self-pay

## 2020-01-17 VITALS — BP 118/74

## 2020-01-17 DIAGNOSIS — Z9884 Bariatric surgery status: Secondary | ICD-10-CM | POA: Insufficient documentation

## 2020-01-17 DIAGNOSIS — G4710 Hypersomnia, unspecified: Secondary | ICD-10-CM

## 2020-01-17 DIAGNOSIS — G4719 Other hypersomnia: Secondary | ICD-10-CM | POA: Insufficient documentation

## 2020-01-17 DIAGNOSIS — R200 Anesthesia of skin: Secondary | ICD-10-CM

## 2020-01-17 DIAGNOSIS — I10 Essential (primary) hypertension: Secondary | ICD-10-CM

## 2020-01-17 DIAGNOSIS — K219 Gastro-esophageal reflux disease without esophagitis: Secondary | ICD-10-CM

## 2020-01-17 DIAGNOSIS — G4730 Sleep apnea, unspecified: Secondary | ICD-10-CM

## 2020-01-17 DIAGNOSIS — R0683 Snoring: Secondary | ICD-10-CM | POA: Diagnosis not present

## 2020-01-17 DIAGNOSIS — Z8489 Family history of other specified conditions: Secondary | ICD-10-CM

## 2020-01-17 NOTE — Patient Instructions (Signed)
Obesity Hypoventilation Syndrome  Obesity hypoventilation syndrome (OHS) means that you are not breathing well enough to get air in and out of your lungs efficiently (ventilation). This causes a low oxygen level and a high carbon dioxide level in your blood (hypoventilation). Having too much total body fat (obesity) is a significant risk factor for developing OHS. OHS makes it harder for your heart to pump oxygen-rich blood to your body. It can cause sleep disturbances and make you feel sleepy during the day. Over time, OHS can increase your risk for:  Heart disease.  High blood pressure (hypertension).  Reduced ability to absorb sugar from the bloodstream (insulin resistance).  Heart failure. Over time, OHS weakens your heart and can lead to heart failure. What are the causes? The exact cause of OHS is not known. Possible causes include:  Pressure on the lungs from excess body weight.  Obesity-related changes in how much air the lungs can hold (lung capacity) and how much they can expand (lung compliance).  Failure of the brain to regulate oxygen and carbon dioxide levels properly.  Chemicals (hormones) produced by excess fat cells interfering with breathing regulation.  A breathing condition in which breathing pauses or becomes shallow during sleep (sleep apnea). This condition can eventually cause the body to ventilate poorly and to hold onto carbon dioxide during the day. What increases the risk? You may have a greater risk for OHS if you:  Have a BMI of 30 or higher. BMI is an estimate of body fat that is calculated from height and weight. For adults, a BMI of 30 or higher is considered obese.  Are 40?45 years old.  Carry most of your excess weight around your waist.  Experience moderate symptoms of sleep apnea. What are the signs or symptoms? The most common symptoms of OHS are:  Daytime sleepiness.  Lack of energy.  Shortness of breath.  Morning headaches.  Sleep  apnea.  Trouble concentrating.  Irritability, mood swings, or depression.  Swollen veins in the neck.  Swelling of the legs. How is this diagnosed? Your health care provider may suspect OHS if you are obese and have poor breathing during the day and at night. Your health care provider will also do a physical exam. You may have tests to:  Measure your BMI.  Measure your blood oxygen level with a sensor placed on your finger (pulse oximetry).  Measure blood oxygen and carbon dioxide in a blood sample.  Measure the amount of red blood cells in a blood sample. OHS causes the number of red blood cells you have to increase (polycythemia).  Check your breathing ability (pulmonary function testing).  Check your breathing ability, breathing patterns, and oxygen level while you sleep (sleep study). You may also have a chest X-ray to rule out other breathing problems. You may have an electrocardiogram (ECG) and or echocardiogram to check for signs of heart failure. How is this treated? Weight loss is the most important part of treatment for OHS, and it may be the only treatment that you need. Other treatments may include:  Using a device to open your airway while you sleep, such as a continuous positive airway pressure (CPAP) machine that delivers oxygen to your airway through a mask.  Surgery (gastric bypass surgery) to lower your BMI. This may be needed if: ? You are very obese. ? Other treatments have not worked for you. ? Your OHS is very severe and is causing organ damage, such as heart failure. Follow these   instructions at home:  Medicines  Take over-the-counter and prescription medicines only as told by your health care provider.  Ask your health care provider what medicines are safe for you. You may be told to avoid medicines that can impair breathing and make OHS worse, such as sedatives and narcotics. Sleeping habits  If you are prescribed a CPAP machine, make sure you  understand and use the machine as directed.  Try to get 8 hours of sleep every night.  Go to bed at the same time every night, and get up at the same time every day. General instructions  Work with your health care provider to make a diet and exercise plan that helps you reach and maintain a healthy weight.  Eat a healthy diet.  Avoid smoking.  Exercise regularly as told by your health care provider.  During the evening, do not drink caffeine and do not eat heavy meals.  Keep all follow-up visits as told by your health care provider. This is important. Contact a health care provider if:  You experience new or worsening shortness of breath.  You have chest pain.  You have an irregular heartbeat (palpitations).  You have dizziness.  You faint.  You develop a cough.  You have a fever.  You have chest pain when you breathe (pleurisy). This information is not intended to replace advice given to you by your health care provider. Make sure you discuss any questions you have with your health care provider. Document Revised: 04/01/2019 Document Reviewed: 05/19/2016 Elsevier Patient Education  2020 Elsevier Inc[employer]  

## 2020-01-17 NOTE — Progress Notes (Signed)
SLEEP MEDICINE CLINIC    Provider:  Larey Seat, MD  Primary Care Physician:  Mardi Mainland, [place] [address]     Referring Provider: Saint Lucia, Ranjan, [place],  [place] [address]          Chief Complaint according to patient   Patient presents with:    . New Patient (Initial Visit)     rm 10. RN:  pt states that she is getting work up completed for bariatric surgery, they want her to have a SS. prios SS 09/11/2010 with Dr. Brett Fairy but h ad not started any treatment       HISTORY OF PRESENT ILLNESS:  Krista Fisher is a 44 year old  African American female patient seen on 01/17/2020 upon referral by Dr. Saint Lucia at the [facility] metabolic and weight loss center.   Chief concern according to patient :  I may need to look into sleep apnea before having bariatric surgery.    I have the pleasure of seeing Krista Fisher today, a right -handed African American female with a possible sleep disorder.   She has a  medical history of GERD (gastroesophageal reflux disease) and Hypertension, morbid obesity and snoring- and now excessive daytime sleepiness. In the meantime she had developed chronic knee and back pain, gastroesophageal reflux disease, hypertension, she still has some depression and anxiety she still snores she also had anemia and reports insomnia problems.  She had a bcholecystectomy in 2013.  She is.she is status post gastric sleeve surgery in 2012.    The patient had the first sleep study in the year 2011.  The patient presented on 11 September 2010 to [place] at [facility], at the time 44 years old she had a past or list of medical diagnoses including morbid obesity, hypothyroidism and depression at the time she felt severely daytime sleepy and had also reportedly begun to snore loudly.  Her BMI was 62 at the time and her Epworth sleepiness score was endorsed at 17 out of 24 points she did have mild apnea as an AHI of 9.7/h  just below the cutoff of 10.  Her REM sleep AHI was 16.6, supine AHI was 11.9.  She did not have prolonged oxygen desaturations she did have some periodic limb movements but those did not lead to arousals.  She slept 86% of the time.  REM latency was prolonged which could have been related to medication she took at the time.  In the meantime she had developed chronic knee and back pain, gastroesophageal reflux disease, hypertension, she still has some depression and anxiety she still snores she also had anemia and reports insomnia problems.  She had a cholecystectomy in 2013.  She is.she is status post gastric sleeve surgery in 2012.    Family medical /sleep history: father has OSA.   Social history:  Patient is working as Therapist, sports and lives in a household with spouse , she has an adult son.  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  Pets are present, a dog. Tobacco use: never .  ETOH use ; rarely. Caffeine intake in form of Coffee( stopped jan 1st) Soda( no) Tea ( none), nor energy drinks. Regular exercise in form of work- too tired, not SOB.    Sleep habits are as follows: The patient's dinner time is between 5.30 and 6.30 PM. The patient goes to bed at 8 PM  and is asleep promptly-  and continues to sleep for 4 hours, wakes for unknown reasons,  She may use this arousals for a bathroom breaks.   Very fragmented sleep after 4 AM. The bedroom is cool, quiet and dark.  The preferred sleep position is lateral, left, with the support of 2 pillows.  Dreams are reportedly frequent/vivid.  The patient wakes up at 6. 45 with an alarm.  7.30 AM is the usual rise time.  She hits the snooze button many times.  She reports not feeling refreshed or restored in AM, with symptoms such as dry mouth and residual fatigue.  Naps are taken frequently, lasting from 1-2 hours and are not affecting her nocturnal sleep. Overall average sleep times in 24 hours is less 6 hours.      Review of Systems: Out of a complete 14 system review, the patient complains of only the following symptoms, and all other reviewed systems are negative.:  Fatigue, sleepiness , snoring, fragmented sleep, Insomnia after early morning arousals, no  Nocturia, no headache.    How likely are you to doze in the following situations: 0 = not likely, 1 = slight chance, 2 = moderate chance, 3 = high chance   Sitting and Reading? 3 Watching Television? 3 Sitting inactive in a public place (theater or meeting)?2-3 As a passenger in a car for an hour without a break? 2 Lying down in the afternoon when circumstances permit? 3 Sitting and talking to someone?0 Sitting quietly after lunch without alcohol?3 In a car, while stopped for a few minutes in traffic?0   Total =  15 / 24 points   FSS endorsed at 45/ 63 points.   Social History   Socioeconomic History  . Marital status: Single    Spouse name: Not on file  . Number of children: Not on file  . Years of education: Not on file  . Highest education level: Not on file  Occupational History  . Not on file  Tobacco Use  . Smoking status: Never Smoker  . Smokeless tobacco: Never Used  Substance and Sexual Activity  . Alcohol use: No  . Drug use: No  . Sexual activity: Not on file  Other Topics Concern  . Not on file  Social History Narrative  . Not on file   Social Determinants of Health   Financial Resource Strain:   . Difficulty of Paying Living Expenses: Not on file  Food Insecurity:   . Worried About Charity fundraiser in the Last Year: Not on file  . Ran Out of Food in the Last Year: Not on file  Transportation Needs:   . Lack of Transportation (Medical): Not on file  . Lack of Transportation (Non-Medical): Not on file  Physical Activity:   . Days of Exercise per Week: Not on file  . Minutes of Exercise per Session: Not on file  Stress:   . Feeling of Stress : Not on file  Social Connections:   . Frequency of  Communication with Friends and Family: Not on file  . Frequency of Social Gatherings with Friends and Family: Not on file  . Attends Religious Services: Not on file  . Active Member of Clubs or Organizations: Not on file  . Attends Archivist Meetings: Not on file  . Marital Status: Not on file    Family History  Problem Relation Age of Onset  . Heart failure Father   . Diabetes Father     Past  Medical History:  Diagnosis Date  . GERD (gastroesophageal reflux disease)   . Hypertension Morbid obesity Hypothyroidism Back pain joint pain.      Past Surgical History:  Procedure Laterality Date  . CESAREAN SECTION    . CHOLECYSTECTOMY    . GASTRECTOMY    . GASTRIC BYPASS    . KNEE SURGERY    . WRIST FRACTURE SURGERY       Current Outpatient Medications on File Prior to Visit  Medication Sig Dispense Refill  . ALPRAZolam (XANAX) 0.5 MG tablet Take 0.5 mg by mouth at bedtime as needed for anxiety.     . cetirizine (ZYRTEC) 10 MG tablet Take 10 mg by mouth daily.    . COLLAGEN PO Take by mouth.    . escitalopram (LEXAPRO) 20 MG tablet Take 20 mg by mouth daily.    Marland Kitchen levonorgestrel-ethinyl estradiol (LUTERA) 0.1-20 MG-MCG tablet Take 1 tablet by mouth daily. 3 Package 3  . Multiple Vitamin (MULTI-VITAMIN) tablet Take by mouth.    . Multiple Vitamins-Iron (MULTIVITAMINS WITH IRON) TABS tablet Take 1 tablet by mouth daily.    Marland Kitchen omeprazole (PRILOSEC) 40 MG capsule Take 40 mg by mouth daily.    Marland Kitchen omeprazole (PRILOSEC) 40 MG capsule Take 40 mg by mouth daily.    . Vitamin D, Ergocalciferol, (DRISDOL) 1.25 MG (50000 UNIT) CAPS capsule Take 50,000 Units by mouth every 7 (seven) days.     No current facility-administered medications on file prior to visit.    Allergies  Allergen Reactions  . Latex     Physical exam:  Today's Vitals   01/17/20 1300  BP: 139/68  Pulse: 79  Temp: (!) 97.5 F (36.4 C)  Weight: (!) 348 lb (157.9 kg)  Height: 5\' 6"  (1.676 m)    Body mass index is 56.17 kg/m.   Wt Readings from Last 3 Encounters:  01/17/20 (!) 348 lb (157.9 kg)  12/06/19 (!) 342 lb (155.1 kg)  05/06/18 (!) 352 lb (159.7 kg)     Ht Readings from Last 3 Encounters:  01/17/20 5\' 6"  (1.676 m)  12/06/19 5\' 6"  (1.676 m)  05/06/18 5\' 6"  (1.676 m)      General: The patient is awake, alert and appears not in acute distress. The patient is well groomed. Head: Normocephalic, atraumatic. Neck is supple. Mallampati : 3  neck circumference: 18 inches . Nasal airflow patent.   Retrognathia is mildly seen.  Dental status:  intact  Cardiovascular:  Regular rate and cardiac rhythm by pulse,  without distended neck veins. Respiratory: Lungs are clear to auscultation.  Skin:  Without evidence of ankle edema, or rash. Trunk: The patient's posture is erect.   Neurologic exam : The patient is awake and alert, oriented to place and time.   Memory subjective described as intact.  Attention span & concentration ability appears normal.  Speech is fluent,  without  dysarthria, dysphonia or aphasia.  Mood and affect are appropriate.   Cranial nerves: no loss of smell or taste reported - Pupils are equal and briskly reactive to light. Funduscopic exam deferred  Extraocular movements in vertical and horizontal planes were intact and without nystagmus. No Diplopia. Visual fields by finger perimetry are intact. Hearing was intact to soft voice and finger rubbing.    Facial sensation intact to fine touch.  Facial motor strength is symmetric and tongue and uvula move midline.  Neck ROM : rotation, tilt and flexion extension were normal for age and shoulder shrug was symmetrical.  Motor exam:  Symmetric bulk, tone and ROM.   Normal tone without cog wheeling, symmetric grip strength .  Sensory:  Fine touch, pinprick and vibration were normal.  Proprioception tested in the upper extremities was normal. Coordination: Rapid alternating movements in the fingers/hands  were of normal speed.  Penmanship is unchanged- " still horrible". The Finger-to-nose maneuver was intact without evidence of ataxia, dysmetria or tremor.   Gait and station: Patient could rise unassisted from a seated position, walked without assistive device.  Stance is of normal width/ base and the patient turned with 3  Steps ( observation) .  Toe and heel walk were deferred.  Deep tendon reflexes: in the  upper and lower extremities are symmetric and intact.  Babinski response was deferred.      After spending a total time of  40 minutes face to face and with time for physical and neurologic examination, review of laboratory studies, previous sleep study, and her [facility] bariatric records.   personal review of imaging studies, reports and results of other testing and review of referral information / records as far as provided in visit, I have established the following assessments:  n short, Vasti Rhetta Mura is presenting with hypersomnia:    1) Nurse Onalee Hua. Carlis Abbott has seen [place] at [facility] for a sleep study 10 years ago at the time her past medical history included struggling with obesity, and the struggle has gone on ever since.  She has become increasingly sleepy something she also had endorsed in her intake sheets of the year 2011.  She is now seen a bariatric weight loss and wellness doctor at [facility], she had undergone a sleeve bariatric surgery the year after her sleep study which had revealed mild sleep apnea.  In the meantime she has developed carpal tunnel syndrome some joint pain and some shoulder neck pain some lower back pain.  Her BMI is now 56 kg/m her baseline blood pressure has been elevated, and she feels that she is always fatigued and never restored and refreshed.  Her family has noted that she still snores and she seems to be a mouth breather reporting dry mouth when she wakes up.  Anatomical data to support this are her high-grade Mallampati and her neck circumference.  My  goal is to evaluate the current level of sleep apnea which I still believe is there, also to make sure that there is no central sleep apnea that may have risen since she was last diagnosed with obstructive sleep apnea.  Even if her AHI would be just above 5 an hour I would now venture to treat her with CPAP, to see if the apnea is actually the cause of her hypersomnia.    There is an additional condition which is insomnia and problems with sustaining sleep after 4 AM.  This is unusual for him apnea patient and can be related to serotonin deficiency sometimes this is responding to melatonin over-the-counter 5 mg or less at bedtime it should help the patient to sleep longer not necessarily fall asleep earlier.    My Plan is to proceed with:  I will order an attended sleep study for OSA screening with possible hypoxemia, Obesity hypoventilation.  If her insurance does not permit , will order HST for screening test.    I would like to thank Mardi Mainland, [place] and [place], Ranjan, [place],  [place] [address] for allowing me to meet with and to take care of this pleasant  patient.   I plan to follow up either personally or through our NP within 2-3  month.    Electronically signed by: Larey Seat, MD 01/17/2020 1:17 PM  [facility] Board certified by The [employer] of Sleep Medicine and Diplomate of the [employer] of Sleep Medicine. Board certified In Neurology through the [place], Fellow of the [employer] of Neurology. Medical Director of [employer] [employer].

## 2020-01-17 NOTE — Progress Notes (Signed)
  Subjective:     Patient ID: Krista Fisher, female   DOB: [birth year], 44 y.o.   MRN: [id number]  HPI Krista Fisher presents to the employee health clinic today to discuss some recent lab results done by her physician. Her PTH was elevated and she was referred to endocrinology, but states she does not have an appointment for 3 weeks. She reports right-sided neck swelling and tenderness x 2 years, was told this was swollen lymph nodes d/t allergies and was told to take allergy medications which she has been doing. She denies any trouble swallowing or change in voice, no fever, cough, or shortness of breath.   Review of Systems  Constitutional: Negative for chills, fever and unexpected weight change.  HENT: Negative for congestion, ear pain, sore throat, trouble swallowing and voice change.   Respiratory: Negative for cough and shortness of breath.   Cardiovascular: Negative for chest pain.       Objective:   Physical Exam Vitals reviewed.  Constitutional:      General: She is not in acute distress.    Appearance: She is not toxic-appearing.  HENT:     Head: Normocephalic and atraumatic.  Neck:     Thyroid: No thyroid mass or thyroid tenderness.  Cardiovascular:     Rate and Rhythm: Normal rate and regular rhythm.     Heart sounds: Normal heart sounds.  Pulmonary:     Effort: Pulmonary effort is normal. No respiratory distress.     Breath sounds: Normal breath sounds.  Musculoskeletal:     Cervical back: Neck supple. No rigidity or tenderness.  Lymphadenopathy:     Cervical: No cervical adenopathy.  Skin:    General: Skin is warm and dry.  Neurological:     Mental Status: She is alert and oriented to person, place, and time.  Psychiatric:        Mood and Affect: Mood normal.        Behavior: Behavior normal.        Assessment:     History of neck swelling     Plan:     1. Discussed lab results with patient and potential causes of hyperparathyroidism. Recommend she keep  endocrinologist appointment for further work up and evaluation. 2. LDL cholesterol was elevated and HDL was low, recommend increased exercise and healthy diet to help improve.  3. A1c was 5.9, prediabetic range. Discussed lifestyle changes to prevent diabetes onset. She met with nutritionist last week and is doing weight watchers and looking to have gastric bypass surgery done in the future.

## 2020-02-07 DIAGNOSIS — N946 Dysmenorrhea, unspecified: Secondary | ICD-10-CM | POA: Insufficient documentation

## 2020-02-07 DIAGNOSIS — E282 Polycystic ovarian syndrome: Secondary | ICD-10-CM | POA: Insufficient documentation

## 2020-02-13 ENCOUNTER — Ambulatory Visit (INDEPENDENT_AMBULATORY_CARE_PROVIDER_SITE_OTHER): Payer: No Typology Code available for payment source | Admitting: Neurology

## 2020-02-13 DIAGNOSIS — Z8489 Family history of other specified conditions: Secondary | ICD-10-CM

## 2020-02-13 DIAGNOSIS — G4719 Other hypersomnia: Secondary | ICD-10-CM

## 2020-02-13 DIAGNOSIS — G4733 Obstructive sleep apnea (adult) (pediatric): Secondary | ICD-10-CM | POA: Diagnosis not present

## 2020-02-13 DIAGNOSIS — R0683 Snoring: Secondary | ICD-10-CM

## 2020-02-13 DIAGNOSIS — R200 Anesthesia of skin: Secondary | ICD-10-CM

## 2020-02-13 DIAGNOSIS — Z9884 Bariatric surgery status: Secondary | ICD-10-CM

## 2020-02-13 DIAGNOSIS — G4710 Hypersomnia, unspecified: Secondary | ICD-10-CM

## 2020-02-14 ENCOUNTER — Ambulatory Visit: Payer: Self-pay | Admitting: Family Medicine

## 2020-02-14 NOTE — Patient Instructions (Signed)

## 2020-02-14 NOTE — Progress Notes (Signed)
Subjective:     Patient ID: Krista Fisher, female   DOB: 13-Apr-1976, 44 y.o.   MRN: [id number]  HPI Krista Fisher presents to the employee health clinic today for a weight check for her weight loss program through [place]. She states since last visit she has not been doing as well with increasing her physical activity or meal planning but wants to get back on track with this.   She had a visit with a nutritionist who changed her meal plan, so she is now not doing weight watchers, but is counting calories 1200-1500 day is her goal with 80 grams of protein daily and 80-100 oz of water per day. She is also taking an iron supplement which has caused some constipation. She is going to start metamucil and try to increase the fiber in her diet.   She continues to walk her dog daily, but is only going out for about 10 minutes due to the colder weather, otherwise no other physical activity. She has bought a stationary bike and plans to use that before work in the mornings.   Past Medical History:  Diagnosis Date  . GERD (gastroesophageal reflux disease)   . Hypertension    Allergies  Allergen Reactions  . Latex     Current Outpatient Medications:  .  ALPRAZolam (XANAX) 0.5 MG tablet, Take 0.5 mg by mouth at bedtime as needed for anxiety. , Disp: , Rfl:  .  cetirizine (ZYRTEC) 10 MG tablet, Take 10 mg by mouth daily., Disp: , Rfl:  .  COLLAGEN PO, Take by mouth., Disp: , Rfl:  .  escitalopram (LEXAPRO) 20 MG tablet, Take 20 mg by mouth daily., Disp: , Rfl:  .  levonorgestrel-ethinyl estradiol (LUTERA) 0.1-20 MG-MCG tablet, Take 1 tablet by mouth daily., Disp: 3 Package, Rfl: 3 .  Multiple Vitamin (MULTI-VITAMIN) tablet, Take by mouth., Disp: , Rfl:  .  Multiple Vitamins-Iron (MULTIVITAMINS WITH IRON) TABS tablet, Take 1 tablet by mouth daily., Disp: , Rfl:  .  omeprazole (PRILOSEC) 40 MG capsule, Take 40 mg by mouth daily., Disp: , Rfl:  .  omeprazole (PRILOSEC) 40 MG capsule, Take 40 mg by mouth daily.,  Disp: , Rfl:  .  Vitamin D, Ergocalciferol, (DRISDOL) 1.25 MG (50000 UNIT) CAPS capsule, Take 50,000 Units by mouth every 7 (seven) days., Disp: , Rfl:       Review of Systems  Constitutional: Negative for activity change, appetite change and unexpected weight change.  Respiratory: Negative for cough and shortness of breath.   Cardiovascular: Negative for chest pain and leg swelling.  Gastrointestinal: Positive for constipation. Negative for abdominal pain.  Musculoskeletal: Negative for gait problem and joint swelling.       Objective:   Physical Exam Vitals reviewed.  Constitutional:      General: She is not in acute distress.    Appearance: She is obese. She is not toxic-appearing.  HENT:     Head: Normocephalic and atraumatic.  Pulmonary:     Effort: Pulmonary effort is normal. No respiratory distress.  Skin:    General: Skin is warm and dry.  Neurological:     Mental Status: She is alert and oriented to person, place, and time.  Psychiatric:        Mood and Affect: Mood normal.        Behavior: Behavior normal.    Today's Vitals   02/14/20 1038  BP: 128/78  Weight: (!) 351 lb 3.2 oz (159.3 kg)  Height: 5\' 6"  (1.676 m)  Body mass index is 56.69 kg/m.      Assessment:     Morbid obesity ([place])      Plan:     1. Krista Fisher continues her efforts at weight loss through the program through [facility] for Metabolic and Weight loss surgery. Lengthy discussion regarding her barriers to meal planning and exercising and different ways to eliminate those barriers and set herself up for success. Goals discussed. F/u here prn.

## 2020-02-16 ENCOUNTER — Encounter: Payer: Self-pay | Admitting: Neurology

## 2020-02-23 NOTE — Progress Notes (Signed)
Summary & Diagnosis:   Mild OSA (obstructive Sleep Apnea) is present at AHI 11.1/h and  only mildly higher in REM sleep at 13.8/h.  There was brady and tachycardia noted.  AHIs in left and in supine sleep were the same. Loud snoring is  indicated by RDI.   Recommendations:     I would still recommend CPAP use in preparation for the surgery,  also it may become soon obsolete.  I will order a CPAP autotitration device with a setting from  5-15 cm water, 2 cm EPR and interface of choice. Heated  humidification.   Interpreting Physician: Larey Seat, MD   [facility], Dr. Ranjan Saint Lucia, MD

## 2020-02-23 NOTE — Addendum Note (Signed)
Addended by: Larey Seat on: 02/23/2020 05:19 PM   Modules accepted: Orders

## 2020-02-23 NOTE — Procedures (Signed)
Patient Information     First Name: Krista Last Name: Carlis Fisher ID: [id number]  Birth Date: [birth year] Age: 44 Gender: Female  Referring Provider: Saint Lucia, Ranjan, Md BMI: 56.0 (W=348 lb, H=5' 6'')  Neck Circ.:  18 '' Epworth:  15/24   Sleep Study Information    Study Date: Feb 13, 2020 S/H/A Version: 001.001.001.001 / 4.1.1528 / 77  History:    Krista Fisher is a right -handed African American female presents for evaluation of OSA pre bariatric surgery- with a medical history of GERD (gastroesophageal reflux disease) and Hypertension, morbid obesity and snoring- and now excessive daytime sleepiness. In the meantime, she had developed chronic knee and back pain, gastroesophageal reflux disease, hypertension, she still has some depression and anxiety she still snores she also had anemia and reports insomnia problems.  She had a cholecystectomy in 2013.  She is status post gastric sleeve surgery in 2012.The patient had the first sleep study in the year 2011.   The patient presented on 11 September 2010 to [place] at [place], at the time 44 years old she had a past or list of medical diagnoses including morbid obesity, hypothyroidism and depression at the time she felt severely daytime sleepy and had also reportedly begun to snore loudly.  Her BMI was 62 at the time and her Epworth sleepiness score was endorsed at 17 out of 24 points she did have mild apnea as an AHI of 9.7/h just below the cutoff of 10.  Her REM sleep AHI was 16.6, supine AHI was 11.9.     Summary & Diagnosis:    Mild OSA (obstructive Sleep Apnea) is present at AHI 11.1/h and only mildly higher in REM sleep at 13.8/h.  There was brady and tachycardia noted.  AHIs in left and in supine sleep were the same. Loud snoring is indicated by RDI.  Recommendations:      I would still recommend CPAP use in preparation for the surgery, also it may become soon obsolete.  I will order a CPAP autotitration device with a setting from 5-15 cm  water, 2 cm EPR and interface of choice. Heated humidification.   Interpreting Physician: Larey Seat, MD            Sleep Summary  Oxygen Saturation Statistics   Start Study Time: End Study Time: Total Recording Time:  8:27:13 PM 6:20:59 AM 9 h, 53 min  Total Sleep Time % REM of Sleep Time:  8 h 1 min  15.9    Mean: 94 Minimum: 74 Maximum: 98  Mean of Desaturations Nadirs (%):   91  Oxygen Desaturation. %:   4-9 10-20 >20 Total  Events Number Total    17  1 89.5 5.3  1 5.3  19 100.0  Oxygen Saturation: <90 <=88 <85 <80 <70  Duration (minutes): Sleep % 0.1 0.0  0.0 0.0  0.0 0.0 0.0 0.0 0.0 0.0     Respiratory Indices      Total Events REM NREM All Night  pRDI:  55  pAHI:  48 ODI:  19  pAHIc:  9  % CSR: 0.0 15.3 13.8 7.7 3.1 12.3 10.6 3.8 1.9 12.7 11.1 4.4 2.1       Pulse Rate Statistics during Sleep (BPM)      Mean: 78 Minimum: 41 Maximum: 133    Indices are calculated using technically valid sleep time of 7 h, 19 min. pRDI /pAHI are calculated using o2 desaturations ? 3%  Body Position Statistics  Position Supine Prone Right Left Non-Supine  Sleep (min) 207.3 194.5 5.0 75.0 274.5  Sleep % 43.0 40.4 1.0 15.6 57.0  pRDI 19.9 4.6 N/A 19.1 9.1  pAHI 17.1 4.6 N/A 14.7 8.0  ODI 6.2 2.8 N/A 5.9 3.5

## 2020-02-24 ENCOUNTER — Encounter: Payer: Self-pay | Admitting: Neurology

## 2020-02-24 NOTE — Telephone Encounter (Signed)
This rather mild degree of sleep apnea should not delay her surgical treatment, I give sleep-apnea related medical clearance.  Larey Seat, MD

## 2020-04-05 DIAGNOSIS — K602 Anal fissure, unspecified: Secondary | ICD-10-CM | POA: Insufficient documentation

## 2020-04-05 DIAGNOSIS — K649 Unspecified hemorrhoids: Secondary | ICD-10-CM | POA: Insufficient documentation

## 2020-05-15 ENCOUNTER — Encounter: Payer: Self-pay | Admitting: Gastroenterology

## 2020-05-15 ENCOUNTER — Ambulatory Visit: Payer: PRIVATE HEALTH INSURANCE | Admitting: Gastroenterology

## 2020-05-15 VITALS — BP 118/80 | HR 92 | Ht 66.0 in | Wt 326.0 lb

## 2020-05-15 DIAGNOSIS — K602 Anal fissure, unspecified: Secondary | ICD-10-CM

## 2020-05-15 MED ORDER — AMBULATORY NON FORMULARY MEDICATION: 1 refills | Status: DC

## 2020-05-15 NOTE — Patient Instructions (Addendum)
If you are age 45 or older, your body mass index should be between 23-30. Your Body mass index is 52.62 kg/m. If this is out of the aforementioned range listed, please consider follow up with your Primary Care Provider.  If you are age 60 or younger, your body mass index should be between 19-25. Your Body mass index is 52.62 kg/m. If this is out of the aformentioned range listed, please consider follow up with your Primary Care Provider.   Diltiazem Gel ointment 2%, apply three times daily every day and then for 1 month after your pain is gone. Up to first knuckle.  Recticare samples given: apply 2-3 times daily.  Sitz baths twice daily.  OV with Dr. Ardis Hughs on 06-19-20 @ 2:30pm.  Thank you for entrusting me with your care and choosing [facility].  Dr Ardis Hughs

## 2020-05-15 NOTE — Progress Notes (Signed)
HPI: This is a very pleasant 44 year old woman whom I am meeting for the first time today.  She tells me many years ago after her gastric sleeve surgery she had troubles with hemorrhoids and a small anal fissure.  It took about a year for the fissure to heal.  Her bowels been fine for almost 8 or 9 years since then and then she underwent a second bariatric surgery, see that listed below.  This was done at [facility] about 5 weeks ago.  She has lost 40 pounds since then.  She noticed immediate change in her bowels afterwards which she was expecting.  She noticed loosening of her stools overall.  She has not seen any bleeding.  She has had severe anal pain since the surgery and the bowel changes.  She has not had any bleeding.  She tends to move her bowels twice in the morning and sometimes later on.  Is usually soft, semiformed.  Old Data Reviewed: She has morbid obesity and just underwent a bariatric surgery with duodenal switch at [facility] just a month ago.  Biliopancreatic diversion with duodenal switch    Review of systems: Pertinent positive and negative review of systems were noted in the above HPI section. All other review negative.   Past Medical History:  Diagnosis Date  . GERD (gastroesophageal reflux disease)   . Hypertension     Past Surgical History:  Procedure Laterality Date  . CESAREAN SECTION    . CHOLECYSTECTOMY    . GASTRECTOMY    . GASTRIC BYPASS    . KNEE SURGERY    . WRIST FRACTURE SURGERY      Current Outpatient Medications  Medication Sig Dispense Refill  . ALPRAZolam (XANAX) 0.5 MG tablet Take 0.5 mg by mouth at bedtime as needed for anxiety.     . cetirizine (ZYRTEC) 10 MG tablet Take 10 mg by mouth daily.    . COLLAGEN PO Take by mouth.    . escitalopram (LEXAPRO) 20 MG tablet Take 20 mg by mouth daily.    . Multiple Vitamin (MULTI-VITAMIN) tablet Take by mouth.    . Multiple Vitamins-Iron (MULTIVITAMINS WITH IRON) TABS tablet Take 1 tablet  by mouth daily.    Marland Kitchen omeprazole (PRILOSEC) 40 MG capsule Take 40 mg by mouth daily.    . Vitamin D, Ergocalciferol, (DRISDOL) 1.25 MG (50000 UNIT) CAPS capsule Take 50,000 Units by mouth every 7 (seven) days.     No current facility-administered medications for this visit.    Allergies as of 05/15/2020 - Review Complete 05/15/2020  Allergen Reaction Noted  . Latex  10/31/2009  . Wellbutrin xl [bupropion]  05/15/2020    Family History  Problem Relation Age of Onset  . Heart failure Father   . Diabetes Father   . Colon cancer Neg Hx   . Stomach cancer Neg Hx   . Esophageal cancer Neg Hx   . Pancreatic cancer Neg Hx     Social History   Socioeconomic History  . Marital status: Single    Spouse name: Not on file  . Number of children: Not on file  . Years of education: Not on file  . Highest education level: Not on file  Occupational History  . Not on file  Tobacco Use  . Smoking status: Never Smoker  . Smokeless tobacco: Never Used  Substance and Sexual Activity  . Alcohol use: No  . Drug use: No  . Sexual activity: Not on file  Other Topics  Concern  . Not on file  Social History Narrative  . Not on file   Social Determinants of Health   Financial Resource Strain:   . Difficulty of Paying Living Expenses:   Food Insecurity:   . Worried About Charity fundraiser in the Last Year:   . Arboriculturist in the Last Year:   Transportation Needs:   . Film/video editor (Medical):   Marland Kitchen Lack of Transportation (Non-Medical):   Physical Activity:   . Days of Exercise per Week:   . Minutes of Exercise per Session:   Stress:   . Feeling of Stress :   Social Connections:   . Frequency of Communication with Friends and Family:   . Frequency of Social Gatherings with Friends and Family:   . Attends Religious Services:   . Active Member of Clubs or Organizations:   . Attends Archivist Meetings:   Marland Kitchen Marital Status:   Intimate Partner Violence:   . Fear of  Current or Ex-Partner:   . Emotionally Abused:   Marland Kitchen Physically Abused:   . Sexually Abused:      Physical Exam: Ht 5\' 6"  (1.676 m)   Wt (!) 326 lb (147.9 kg)   BMI 52.62 kg/m  Constitutional: generally well-appearing Psychiatric: alert and oriented x3 Eyes: extraocular movements intact Mouth: oral pharynx moist, no lesions Neck: supple no lymphadenopathy Cardiovascular: heart regular rate and rhythm Lungs: clear to auscultation bilaterally Abdomen: soft, nontender, nondistended, no obvious ascites, no peritoneal signs, normal bowel sounds Extremities: no lower extremity edema bilaterally Skin: no lesions on visible extremities Rectal exam with female assistant in the room: Quite limited due to her size, I did see some small deflated external anal hemorrhoid tissue.  Digital rectal was very tender, I do not see an obvious fissure however the visualization was quite limited  Assessment and plan: 44 y.o. female with anal pain, burning since bowel changes related to her second bariatric surgery  Clinically this is presenting like an anal fissure.  Poor visualization of her anus given her size, also quite tender at the anus during the examination.  She has had that in the past.  I am going to treated as such with topical ointments, sitz bath twice daily and she will return to see me in 5 or 6 weeks and sooner if any serious troubles.  Please see the "Patient Instructions" section for addition details about the plan.   Owens Loffler, MD [facility] 05/15/2020, 1:31 PM  Cc: Mardi Mainland,*  Total time on date of encounter was 45  minutes (this included time spent preparing to see the patient reviewing records; obtaining and/or reviewing separately obtained history; performing a medically appropriate exam and/or evaluation; counseling and educating the patient and family if present; ordering medications, tests or procedures if applicable; and documenting clinical  information in the health record).

## 2020-06-04 DIAGNOSIS — K6289 Other specified diseases of anus and rectum: Secondary | ICD-10-CM | POA: Diagnosis present

## 2020-06-19 ENCOUNTER — Ambulatory Visit (INDEPENDENT_AMBULATORY_CARE_PROVIDER_SITE_OTHER): Payer: PRIVATE HEALTH INSURANCE | Admitting: Gastroenterology

## 2020-06-19 ENCOUNTER — Encounter: Payer: Self-pay | Admitting: Gastroenterology

## 2020-06-19 VITALS — BP 112/70 | HR 82 | Ht 66.0 in | Wt 313.0 lb

## 2020-06-19 DIAGNOSIS — K602 Anal fissure, unspecified: Secondary | ICD-10-CM | POA: Diagnosis not present

## 2020-06-19 MED ORDER — FLUCONAZOLE 100 MG PO TABS: 100.0000 mg | ORAL_TABLET | Freq: Every day | ORAL | 0 refills | Status: DC

## 2020-06-19 NOTE — Progress Notes (Signed)
HPI: This is a very pleasant 44 year old woman whom I last saw about 5 weeks ago  I met her for the first time about 5 weeks ago.  She had just had a redo bariatric surgery (biliopancreatic diversion with duodenal switch) at [facility] about 1 month prior to that visit and had lost 40 pounds since then.  She had been having significant anal pain since the surgery.  She also related a history of anal fissure many years ago at the time of her initial bariatric surgery.  Examination of her anus was quite limited due to her morbidly obese size however I did see some small deflated external anal hemorrhoid tissue.  Digital rectal exam was very tender.  Clinically she was presenting like an anal fissure and I treated her as such with topical ointments, sitz baths twice daily.  She has been doing all the things and the severe anal pain has completely resolved.  She is bothered by a lot of skin irritation and itchiness at the anus and in the gluteal crease's.  In addition to the above treatments she has also been putting some type of a moisturizing ball at the area and through the crease.  She has lost another 13 pounds since her last office visit.   ROS: complete GI ROS as described in HPI, all other review negative.  Constitutional:  No unintentional weight loss   Past Medical History:  Diagnosis Date  . GERD (gastroesophageal reflux disease)   . Hypertension     Past Surgical History:  Procedure Laterality Date  . CESAREAN SECTION    . CHOLECYSTECTOMY    . GASTRECTOMY    . GASTRIC BYPASS    . KNEE SURGERY    . WRIST FRACTURE SURGERY      Current Outpatient Medications  Medication Sig Dispense Refill  . ALPRAZolam (XANAX) 0.5 MG tablet Take 0.5 mg by mouth at bedtime as needed for anxiety.     . AMBULATORY NON FORMULARY MEDICATION Diltiazem Gel ointment 2%, apply a pea size amount to rectum three times daily every day and then for 1 month after your pain is gone. Up to first  knuckle. 30 g 1  . cetirizine (ZYRTEC) 10 MG tablet Take 10 mg by mouth daily.    . COLLAGEN PO Take by mouth.    . Multiple Vitamin (MULTI-VITAMIN) tablet Take by mouth.    . Multiple Vitamins-Iron (MULTIVITAMINS WITH IRON) TABS tablet Take 1 tablet by mouth daily.    Marland Kitchen omeprazole (PRILOSEC) 40 MG capsule Take 40 mg by mouth daily.    . Vitamin D, Ergocalciferol, (DRISDOL) 1.25 MG (50000 UNIT) CAPS capsule Take 50,000 Units by mouth every 7 (seven) days.     No current facility-administered medications for this visit.    Allergies as of 06/19/2020 - Review Complete 06/19/2020  Allergen Reaction Noted  . Latex  10/31/2009  . Wellbutrin xl [bupropion]  05/15/2020    Family History  Problem Relation Age of Onset  . Heart failure Father   . Diabetes Father   . Colon cancer Neg Hx   . Stomach cancer Neg Hx   . Esophageal cancer Neg Hx   . Pancreatic cancer Neg Hx     Social History   Socioeconomic History  . Marital status: Single    Spouse name: Not on file  . Number of children: Not on file  . Years of education: Not on file  . Highest education level: Not on file  Occupational History  .  Not on file  Tobacco Use  . Smoking status: Never Smoker  . Smokeless tobacco: Never Used  Vaping Use  . Vaping Use: Never used  Substance and Sexual Activity  . Alcohol use: No  . Drug use: No  . Sexual activity: Not on file  Other Topics Concern  . Not on file  Social History Narrative  . Not on file   Social Determinants of Health   Financial Resource Strain:   . Difficulty of Paying Living Expenses:   Food Insecurity:   . Worried About Charity fundraiser in the Last Year:   . Arboriculturist in the Last Year:   Transportation Needs:   . Film/video editor (Medical):   Marland Kitchen Lack of Transportation (Non-Medical):   Physical Activity:   . Days of Exercise per Week:   . Minutes of Exercise per Session:   Stress:   . Feeling of Stress :   Social Connections:   .  Frequency of Communication with Friends and Family:   . Frequency of Social Gatherings with Friends and Family:   . Attends Religious Services:   . Active Member of Clubs or Organizations:   . Attends Archivist Meetings:   Marland Kitchen Marital Status:   Intimate Partner Violence:   . Fear of Current or Ex-Partner:   . Emotionally Abused:   Marland Kitchen Physically Abused:   . Sexually Abused:      Physical Exam: Ht '5\' 6"'  (1.676 m)   Wt (!) 313 lb (142 kg)   BMI 50.52 kg/m  Constitutional: generally well-appearing Psychiatric: alert and oriented x3 Abdomen: soft, nontender, nondistended, no obvious ascites, no peritoneal signs, normal bowel sounds No peripheral edema noted in lower extremities Rectal exam with female assistant in the room.  Small amount of deflated external anal hemorrhoid tissue.  The anus itself was much less tender, I cannot see any fissure.  Is able to do digital rectal exam easily without severe pain.  I was struck most by how glistening and moist the perianal skin was for 5 or 6 cm around the anus and posterior upper gluteal crease.  There is a white shininess to it as well.  Assessment and plan: 44 y.o. female with likely resolved anal fissure, now with perianal yeast infection  I think she probably has a perianal yeast infection from the anal fissure treatment.  The moisturizing balm which she was placing at the site in the gluteal crease may be the biggest offender.  I asked her to stop all chemicals at the site.  To wash it once or twice daily when showering with gentle soap and pat dry with towel.  I am also calling her in a prescription for Diflucan 100 mg pills 1 pill once daily for 7 days.  She knows to call if she is not getting better.  Please see the "Patient Instructions" section for addition details about the plan.  Owens Loffler, MD [facility] 06/19/2020, 2:22 PM   Total time on date of encounter was 32 minutes (this included time spent  preparing to see the patient reviewing records; obtaining and/or reviewing separately obtained history; performing a medically appropriate exam and/or evaluation; counseling and educating the patient and family if present; ordering medications, tests or procedures if applicable; and documenting clinical information in the health record).

## 2020-06-19 NOTE — Patient Instructions (Addendum)
If you are age 45 or older, your body mass index should be between 23-30. Your Body mass index is 50.52 kg/m. If this is out of the aforementioned range listed, please consider follow up with your Primary Care Provider.  If you are age 41 or younger, your body mass index should be between 19-25. Your Body mass index is 50.52 kg/m. If this is out of the aformentioned range listed, please consider follow up with your Primary Care Provider.   We have sent the following medications to your pharmacy for you to pick up at your convenience:  START: diflucan 100mg  take 1 tablet daily for 7 days.  Please call our office if you are still not feeling well after finishing medication.  Thank you for entrusting me with your care and choosing [facility].  Dr Ardis Hughs

## 2020-06-26 ENCOUNTER — Telehealth: Payer: Self-pay

## 2020-06-26 ENCOUNTER — Ambulatory Visit: Payer: PRIVATE HEALTH INSURANCE | Admitting: Family Medicine

## 2020-06-26 ENCOUNTER — Other Ambulatory Visit: Payer: Self-pay | Admitting: Family Medicine

## 2020-06-26 VITALS — BP 116/78 | Temp 97.7°F

## 2020-06-26 DIAGNOSIS — R350 Frequency of micturition: Secondary | ICD-10-CM

## 2020-06-26 LAB — POCT URINALYSIS DIPSTICK
Bilirubin, UA: NEGATIVE
Glucose, UA: NEGATIVE
Leukocytes, UA: NEGATIVE
Nitrite, UA: NEGATIVE
Protein, UA: NEGATIVE
Spec Grav, UA: 1.02 (ref 1.010–1.025)
Urobilinogen, UA: 0.2 E.U./dL
pH, UA: 6 (ref 5.0–8.0)

## 2020-06-26 MED ORDER — OMEPRAZOLE 40 MG PO CPDR: 40.0000 mg | DELAYED_RELEASE_CAPSULE | Freq: Every day | ORAL | 2 refills | Status: DC

## 2020-06-26 NOTE — Progress Notes (Signed)
  Subjective:     Patient ID: Krista Fisher, female   DOB: [birth year], 44 y.o.   MRN: [id number]  HPI Krista Fisher [facility] the [facility] for evaluation of increased urinary frequency and urgency over the last couple days. She denies fever, abdominal pain, N/V, hematuria, dysuria, vaginal discharge, or flank pain. She reports finishing a 7 day course of diflucan yesterday that her GI doctor prescribed. She reports taking prilosec daily with good benefit, requesting a refill. She states she is supposed to take x 6 months post op and then wean off as tolerated.   She recently underwent bariatric surgery in April and is doing well post-op, has had some bowel changes as well as anal fissure and hemorrhoids, but is being followed by GI.    Review of Systems  Constitutional: Negative for chills, fever and unexpected weight change.  Respiratory: Negative for shortness of breath.   Cardiovascular: Negative for chest pain.  Gastrointestinal: Negative for abdominal pain, nausea and vomiting.  Genitourinary: Positive for frequency and urgency. Negative for dysuria, flank pain, hematuria, menstrual problem, pelvic pain and vaginal discharge.       Objective:   Physical Exam Vitals reviewed.  Constitutional:      General: She is not in acute distress.    Appearance: Normal appearance. She is not toxic-appearing.  HENT:     Head: Normocephalic and atraumatic.  Cardiovascular:     Rate and Rhythm: Normal rate and regular rhythm.     Heart sounds: Normal heart sounds.  Pulmonary:     Effort: Pulmonary effort is normal.     Breath sounds: Normal breath sounds.  Abdominal:     Tenderness: There is no right CVA tenderness or left CVA tenderness.  Skin:    General: Skin is warm and dry.  Neurological:     Mental Status: She is alert and oriented to person, place, and time.  Psychiatric:        Mood and Affect: Mood normal.        Behavior: Behavior normal.    Results for orders  placed or performed in visit on 06/26/20  Urinalysis Dipstick  Result Value Ref Range   Color, UA dark yellow    Clarity, UA clear    Glucose, UA Negative Negative   Bilirubin, UA Negative    Ketones, UA moderate    Spec Grav, UA 1.020 1.010 - 1.025   Blood, UA large    pH, UA 6.0 5.0 - 8.0   Protein, UA Negative Negative   Urobilinogen, UA 0.2 0.2 or 1.0 E.U./dL   Nitrite, UA negative    Leukocytes, UA Negative Negative   Appearance     Odor          Assessment:     Urinary frequency      Plan:     1. Urine dipstick today was negative for infection, large amount of blood, however patient states she is having spotting from her period. Will send for culture and notify of results. Recommend increasing water intake. If symptoms worsen, develop fever, abdominal pain, flank pain, N/V, frank hematuria, or any other concerning symptoms she should go to ED for evaluation. If culture is negative for infection will need further f/u if symptoms persist.

## 2020-06-26 NOTE — Telephone Encounter (Signed)
-----   Message from Milus Banister, MD sent at 06/26/2020  6:47 AM EDT ----- She needs a colonoscopy for minor rectal bleeding at [facility] long given her morbid obesity, my first available appointment on a Thursday.  Thanks

## 2020-06-27 ENCOUNTER — Other Ambulatory Visit: Payer: Self-pay

## 2020-06-27 DIAGNOSIS — K602 Anal fissure, unspecified: Secondary | ICD-10-CM

## 2020-06-27 DIAGNOSIS — K6289 Other specified diseases of anus and rectum: Secondary | ICD-10-CM

## 2020-06-27 LAB — URINE CULTURE: Result:: NO GROWTH

## 2020-06-27 NOTE — Telephone Encounter (Signed)
The pt has been scheduled for colon at [facility] with Dr Ardis Hughs on 08/09/20 at [facility].  She does not need COVID testing.  Last vaccine was in Feb.  Pt aware and all information sent to My Chart. Pt confirmed she can review.

## 2020-06-28 NOTE — Progress Notes (Signed)
Krista Fisher,   Your urine culture showed no growth of bacteria, so no urinary tract infection. If your symptoms continue please schedule a follow up visit for further evaluation. If you have any questions let me know.  Take care, Ria Comment, NP

## 2020-07-02 DIAGNOSIS — K909 Intestinal malabsorption, unspecified: Secondary | ICD-10-CM | POA: Insufficient documentation

## 2020-07-02 DIAGNOSIS — R198 Other specified symptoms and signs involving the digestive system and abdomen: Secondary | ICD-10-CM | POA: Insufficient documentation

## 2020-07-03 ENCOUNTER — Other Ambulatory Visit: Payer: Self-pay | Admitting: Obstetrics and Gynecology

## 2020-07-03 DIAGNOSIS — D259 Leiomyoma of uterus, unspecified: Secondary | ICD-10-CM

## 2020-07-11 ENCOUNTER — Encounter: Payer: Self-pay | Admitting: *Deleted

## 2020-07-11 ENCOUNTER — Ambulatory Visit
Admission: RE | Admit: 2020-07-11 | Discharge: 2020-07-11 | Disposition: A | Discharge status: Home / Self Care | Payer: PRIVATE HEALTH INSURANCE | Source: Ambulatory Visit | Attending: Obstetrics and Gynecology | Admitting: Obstetrics and Gynecology

## 2020-07-11 ENCOUNTER — Other Ambulatory Visit: Payer: Self-pay

## 2020-07-11 DIAGNOSIS — D259 Leiomyoma of uterus, unspecified: Secondary | ICD-10-CM

## 2020-07-11 HISTORY — PX: IR RADIOLOGIST EVAL & MGMT: IMG5224

## 2020-07-11 NOTE — Consult Note (Signed)
Chief Complaint: Patient was consulted remotely today (TeleHealth) for symptomatic uterine fibroids at the request of Banga,Cecilia Worema.    Referring Physician(s): Banga,Cecilia Worema  History of Present Illness: Krista Fisher is a 44 y.o. female who was diagnosed with 10 to 12 years ago when she underwent laparoscopic removal for pelvic pain and pressure.  Over the past 3 to 4 months she has had recurrence of the same sort of  pelvic pain and pressure.  She is not having significant menorrhagia.  She is currently on Nexplanon.  She is G1, P1 with no plans for future pregnancy. Past Medical History:  Diagnosis Date  . GERD (gastroesophageal reflux disease)   . Hypertension     Past Surgical History:  Procedure Laterality Date  . CESAREAN SECTION    . CHOLECYSTECTOMY    . GASTRECTOMY    . GASTRIC BYPASS    . KNEE SURGERY    . WRIST FRACTURE SURGERY      Allergies: Latex and Wellbutrin xl [bupropion]  Medications: Prior to Admission medications   Medication Sig Start Date End Date Taking? Authorizing Provider  ALPRAZolam Duanne Moron) 0.5 MG tablet Take 0.5 mg by mouth at bedtime as needed for anxiety.     [provider]  AMBULATORY NON FORMULARY MEDICATION Diltiazem Gel ointment 2%, apply a pea size amount to rectum three times daily every day and then for 1 month after your pain is gone. Up to first knuckle. 05/15/20   Milus Banister, MD  cetirizine (ZYRTEC) 10 MG tablet Take 10 mg by mouth daily.    [provider]  COLLAGEN PO Take by mouth.    [provider]  Multiple Vitamin (MULTI-VITAMIN) tablet Take by mouth. 06/19/11   [provider]  Multiple Vitamins-Iron (MULTIVITAMINS WITH IRON) TABS tablet Take 1 tablet by mouth daily.    [provider]  omeprazole (PRILOSEC) 40 MG capsule Take 1 capsule (40 mg total) by mouth daily. 06/26/20   Cheyenne Adas, NP  Vitamin D, Ergocalciferol, (DRISDOL) 1.25 MG (50000 UNIT)  CAPS capsule Take 50,000 Units by mouth every 7 (seven) days.    [provider]     Family History  Problem Relation Age of Onset  . Heart failure Father   . Diabetes Father   . Colon cancer Neg Hx   . Stomach cancer Neg Hx   . Esophageal cancer Neg Hx   . Pancreatic cancer Neg Hx     Social History   Socioeconomic History  . Marital status: Single    Spouse name: Not on file  . Number of children: Not on file  . Years of education: Not on file  . Highest education level: Not on file  Occupational History  . Not on file  Tobacco Use  . Smoking status: Never Smoker  . Smokeless tobacco: Never Used  Vaping Use  . Vaping Use: Never used  Substance and Sexual Activity  . Alcohol use: No  . Drug use: No  . Sexual activity: Not on file  Other Topics Concern  . Not on file  Social History Narrative  . Not on file   Social Determinants of Health   Financial Resource Strain:   . Difficulty of Paying Living Expenses:   Food Insecurity:   . Worried About Charity fundraiser in the Last Year:   . [place] in the Last Year:   Transportation Needs:   . Lack of  Transportation (Medical):   Marland Kitchen Lack of Transportation (Non-Medical):   Physical Activity:   . Days of Exercise per Week:   . Minutes of Exercise per Session:   Stress:   . Feeling of Stress :   Social Connections:   . Frequency of Communication with Friends and Family:   . Frequency of Social Gatherings with Friends and Family:   . Attends Religious Services:   . Active Member of Clubs or Organizations:   . Attends Archivist Meetings:   Marland Kitchen Marital Status:     ECOG Status: 1 - Symptomatic but completely ambulatory  Review of Systems  Review of Systems: A 12 point ROS discussed and pertinent positives are indicated in the HPI above.  All other systems are negative.  Physical Exam No direct physical exam was performed (except for noted visual exam findings with Video Visits).       Vital Signs: There were no vitals taken for this visit.  Imaging: No results found.  Labs:  CBC: No results for input(s): WBC, HGB, HCT, PLT in the last 8760 hours.  COAGS: No results for input(s): INR, APTT in the last 8760 hours.  BMP: No results for input(s): NA, K, CL, CO2, GLUCOSE, BUN, CALCIUM, CREATININE, GFRNONAA, GFRAA in the last 8760 hours.  Invalid input(s): CMP  LIVER FUNCTION TESTS: No results for input(s): BILITOT, AST, ALT, ALKPHOS, PROT, ALBUMIN in the last 8760 hours.  TUMOR MARKERS: No results for input(s): AFPTM, CEA, CA199, CHROMGRNA in the last 8760 hours.  Assessment and Plan:  My impression is that this patient's recurrent pelvic pain is  likely secondary to uterine fibroids. We spent the majority of the consultation discussing the pathophysiology of uterine leiomyomata, natural history, anticipated  involution post menopause, and treatment options. We discussed myomectomy, hysterectomy, and uterine fibroid embolization. I described the technique of UFE, anticipated benefits, possible risks and complications including but not limited to bleeding, infection, vessel damage, nontarget embolization, and incomplete symptom relief. We discussed the 90% clinical success rate historically and at our experience with UFE for bleeding; 80% for bulk symptoms. We discussed the post procedure course and time course of symptom resolution. We discussed the need for continued gynecologic care.  She seemed to understand and did ask appropriate questions, which were answered.  Based on her evaluation thus far, I think she would be an appropriate candidate for uterine fibroid embolization because of her symptomatology and  uterine fibroids. To complete her evaluation and workup, I would require pelvic MRI with contrast to best determine the exact site of location of her uterine fibroids, specifically to exclude any pedunculated fibroids on a narrow stalk, as well as to exclude any  unexpected pelvic pathology.   After our discussion, the patient was motivated proceed. Accordingly, we can set up the MRI   at her convenience as an outpatient. If this looks okay, she would like to proceed with UFE.   Thank you for this interesting consult.  I greatly enjoyed meeting Krista Fisher and look forward to participating in their care.  A copy of this report was sent to the requesting provider on this date.  Electronically Signed: Rickard Rhymes 07/11/2020, 4:40 PM   I spent a total of  30 Minutes   in remote  clinical consultation, greater than 50% of which was counseling/coordinating care for symptomatic uterine fibroids, pelvic pain.    Visit type: Audio and video [facility] WebEx).   Alternative for in-person consultation at [facility],  [address], [place], [place]. This visit type was conducted due to national recommendations for restrictions regarding the COVID-19 Pandemic (e.g. social distancing).  This format is felt to be most appropriate for this patient at this time.  All issues noted in this document were discussed and addressed.

## 2020-07-16 ENCOUNTER — Other Ambulatory Visit: Payer: Self-pay | Admitting: Interventional Radiology

## 2020-07-16 DIAGNOSIS — D250 Submucous leiomyoma of uterus: Secondary | ICD-10-CM

## 2020-07-19 ENCOUNTER — Ambulatory Visit (HOSPITAL_COMMUNITY)
Admission: RE | Admit: 2020-07-19 | Discharge: 2020-07-19 | Disposition: A | Discharge status: Home / Self Care | Payer: PRIVATE HEALTH INSURANCE | Source: Ambulatory Visit | Attending: Interventional Radiology | Admitting: Interventional Radiology

## 2020-07-19 ENCOUNTER — Other Ambulatory Visit: Payer: Self-pay

## 2020-07-19 DIAGNOSIS — D250 Submucous leiomyoma of uterus: Secondary | ICD-10-CM | POA: Insufficient documentation

## 2020-07-19 IMAGING — MR MR PELVIS WO/W CM
12 of 14 series · 37 of 48 positions shown · IV contrast (gadavist)
Comparison: None.

CLINICAL DATA: Uterine fibroids, pelvic pain and pressure

EXAM:
MRI PELVIS WITHOUT AND WITH CONTRAST
TECHNIQUE: Multiplanar multisequence MR imaging of the pelvis was performed
both before and after administration of intravenous contrast.
CONTRAST:  10mL GADAVIST GADOBUTROL 1 MMOL/ML IV SOLN

[Series 3: T2 · coronal · 5.0mm · 1.56mm/px · 3 of 36 slices shown (1 of 4)]
[im 1/36]
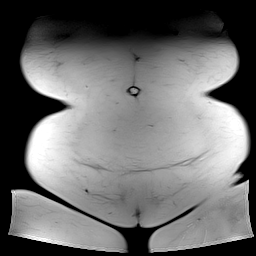
[im 18/36]
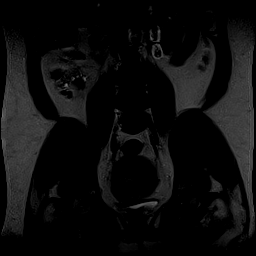
[im 36/36]
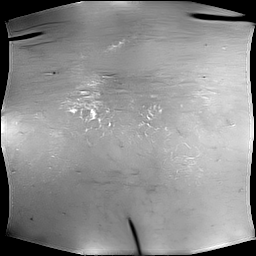

[Series 5: T2 · axial · 5.0mm · 0.39mm/px · z∈[-129,+27]mm · 2 of 27 slices shown (2 of 4)]
[im 1/27]
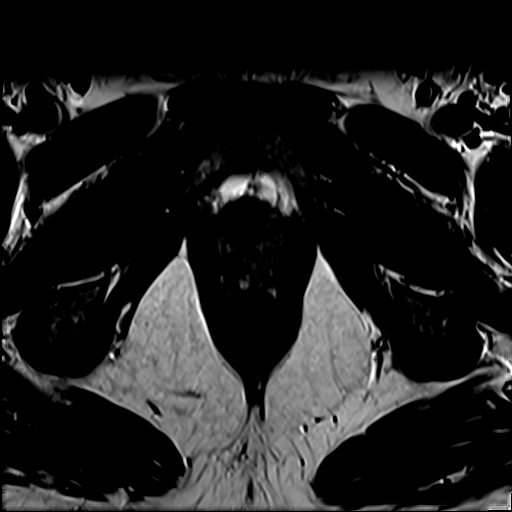
[im 27/27]
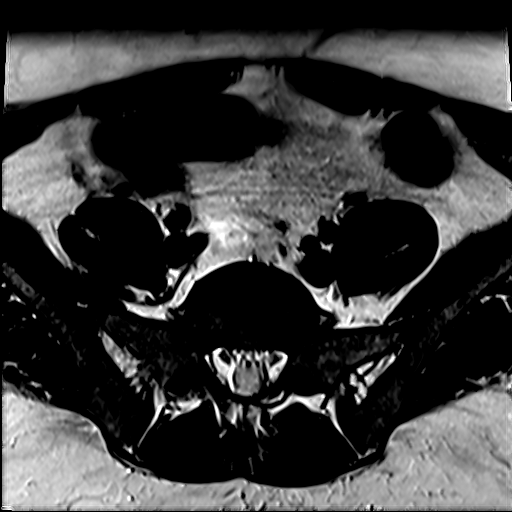

[Series 6: T2 fat-sat · axial · 5.0mm · 0.39mm/px · z∈[-129,+27]mm · 2 of 27 slices shown]
[im 1/27]
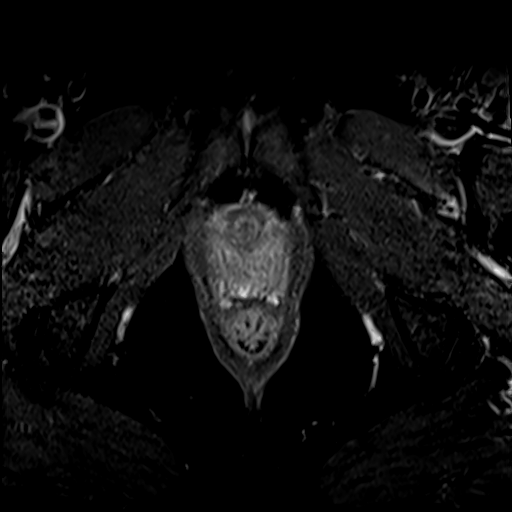
[im 27/27]
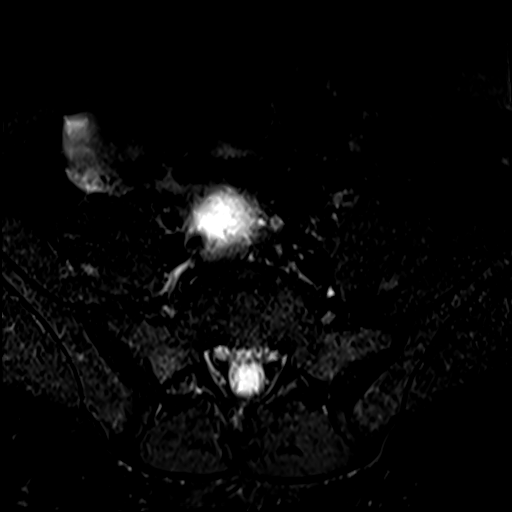

[Series 7: T2 · coronal · 5.0mm · 0.78mm/px · 2 of 26 slices shown (3 of 4)]
[im 1/26]
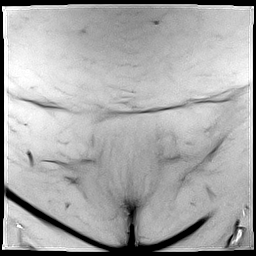
[im 26/26]
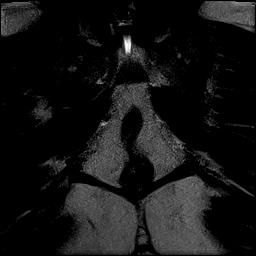

[Series 8: T2 · sagittal · 5.0mm · 0.39mm/px · 2 of 25 slices shown (4 of 4)]
[im 1/25]
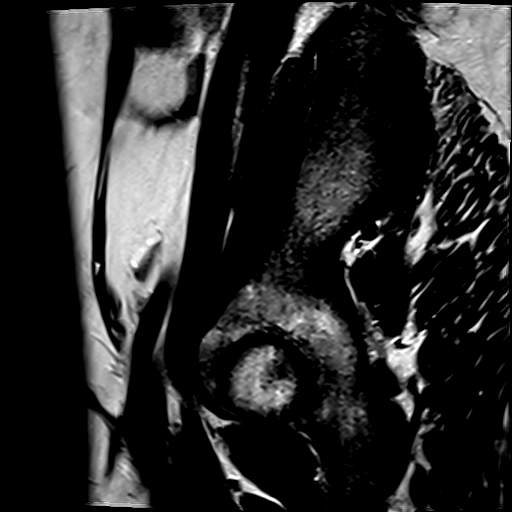
[im 25/25]
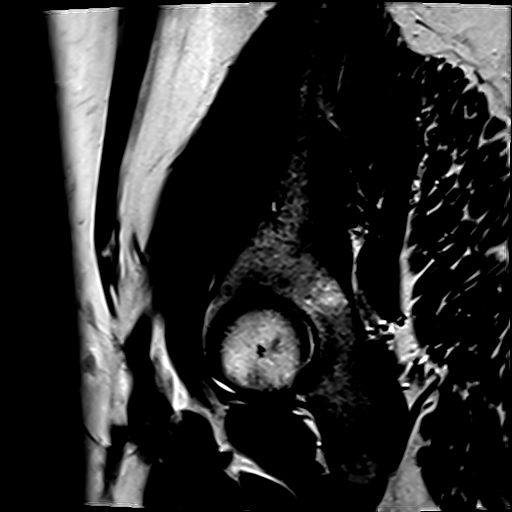

[Series 9: T1 · axial · 4.0mm · 0.84mm/px · z∈[-144,+44]mm · 4 of 48 slices shown (1 of 2)]
[im 1/48]
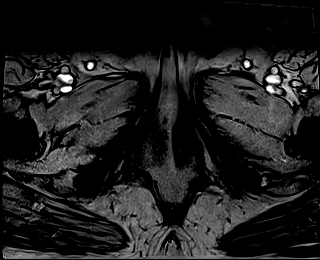
[im 16/48]
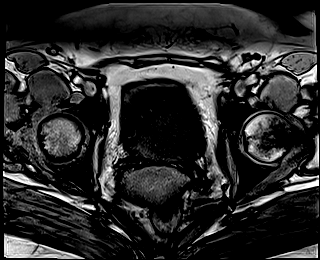
[im 32/48]
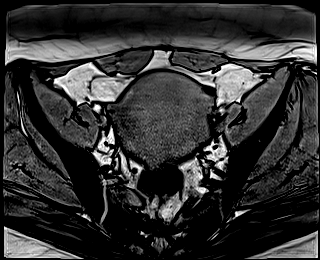
[im 48/48]
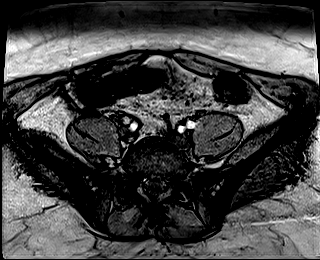

[Series 10: T1 · axial · 4.0mm · 0.84mm/px · z∈[-144,+44]mm · 4 of 48 slices shown (2 of 2)]
[im 1/48]
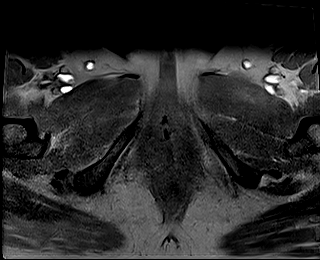
[im 16/48]
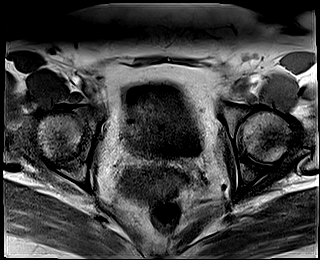
[im 32/48]
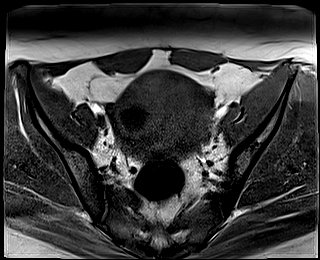
[im 48/48]
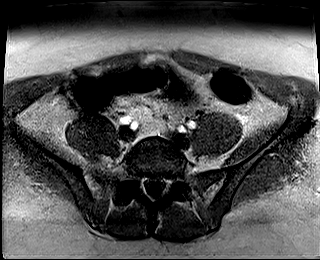

[Series 12: T1 dynamic · axial · 4.0mm · 0.84mm/px · z∈[-161,+59]mm · 5 of 56 slices shown (1 of 3)]
[im 1/56]
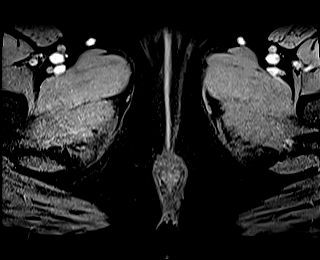
[im 14/56]
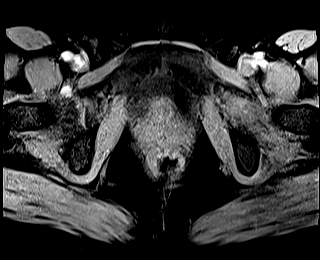
[im 28/56]
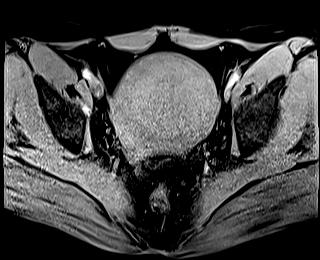
[im 42/56]
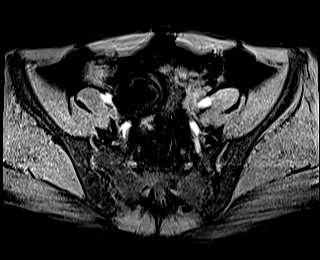
[im 56/56]
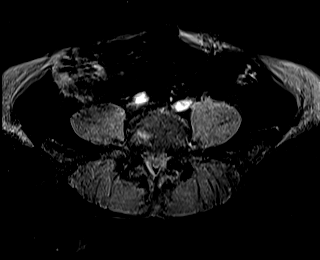

[Series 14: T1 dynamic · axial · 4.0mm · 0.84mm/px · z∈[-161,+59]mm · 5 of 56 slices shown (2 of 3)]
[im 1/56]
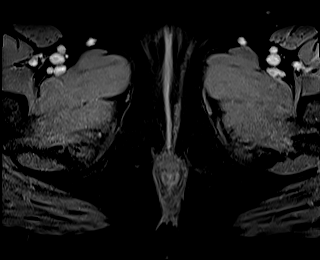
[im 14/56]
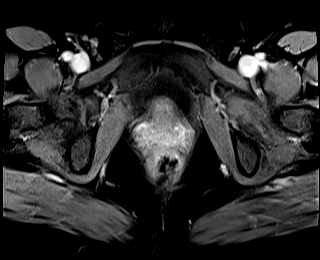
[im 28/56]
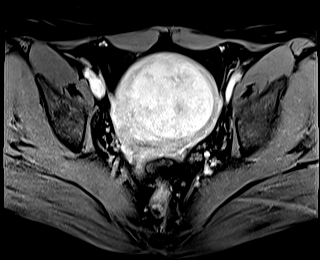
[im 42/56]
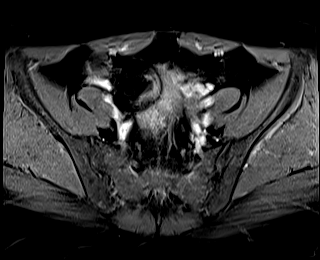
[im 56/56]
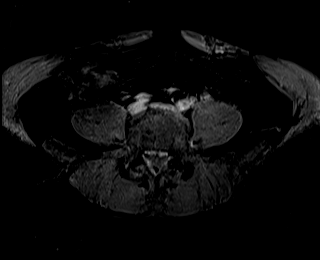

[Series 16: T1 dynamic · axial · 4.0mm · 0.84mm/px · z∈[-161,+59]mm · 5 of 56 slices shown (3 of 3)]
[im 1/56]
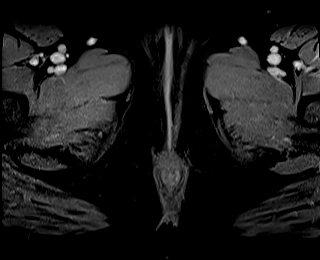
[im 14/56]
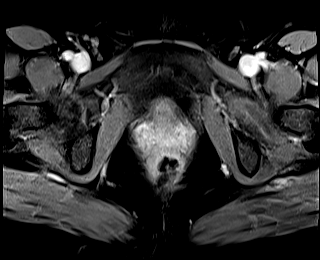
[im 28/56]
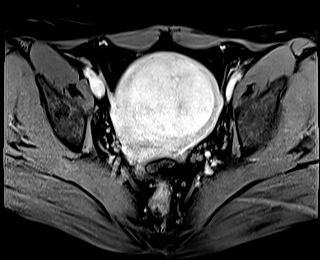
[im 42/56]
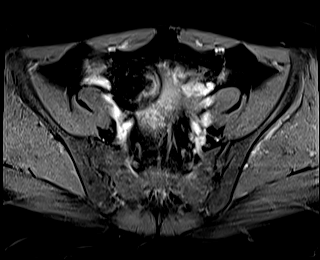
[im 56/56]
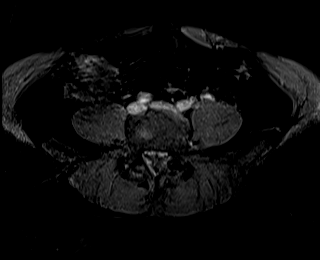

[Series 17: T1 fat-sat post-contrast · coronal · 5.0mm · 0.39mm/px · 2 of 26 slices shown]
[im 1/26]
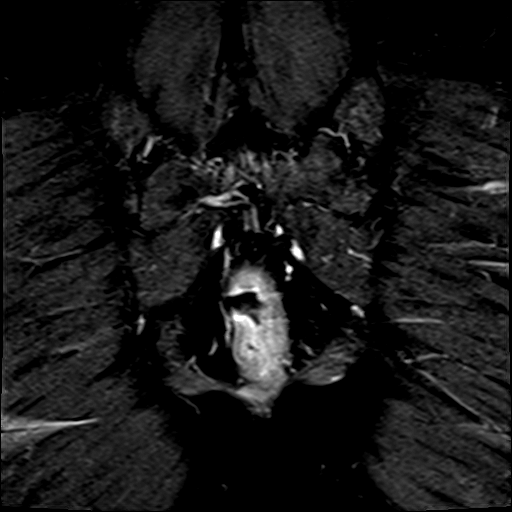
[im 26/26]
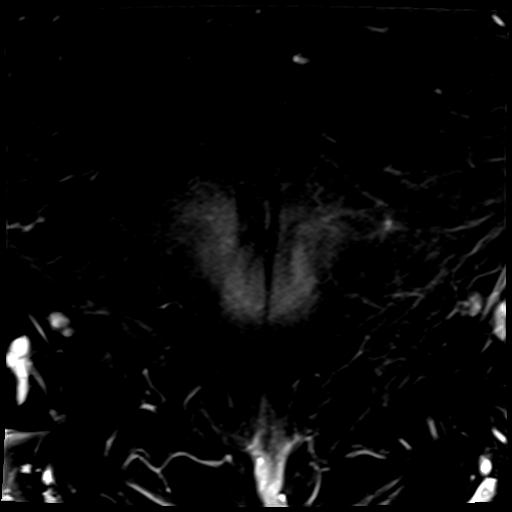

[Series 18: T2 post-contrast · sagittal · 5.0mm · 0.39mm/px · 1 of 25 slices shown]
[im 1/25]
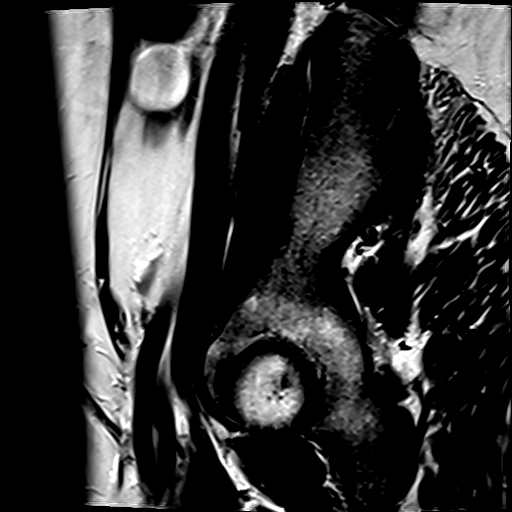

[37 of 48 positions shown; findings below may reference images not displayed]

FINDINGS: Urinary Tract:  No abnormality visualized.

Bowel:  Unremarkable visualized pelvic bowel loops.

Vascular/Lymphatic: No pathologically enlarged lymph nodes. No
significant vascular abnormality seen.

Reproductive: Fibroid uterus, measuring approximately 10.3 x 8.9 x
9.7 cm in total, a dominant fibroid of the anterior uterine fundus
with a large submucosal component distorting the endometrial cavity
measures approximately 8.1 x 6.6 x 6.9 cm. There are additional much
smaller intramural fibroids of the posterior fundus and body; the
presence of significant submucosal components of these fibroids is
more difficult to distinguish. Fluid signal simple cysts of the
right ovary.

Other:  None.

Musculoskeletal: No suspicious bone lesions identified.
IMPRESSION: 1. Fibroid uterus with dominant fibroid of the anterior uterine
fundus with a large submucosal component distorting the endometrial
cavity. Overall bulk of the uterus is approximately 10.3 cm and
largest fibroid is approximately 8.1 cm.

2. There are additional much smaller intramural fibroids of the
posterior fundus and body; the presence of significant submucosal
components of these fibroids is more difficult to distinguish.

## 2020-07-19 MED ORDER — GADOBUTROL 1 MMOL/ML IV SOLN
10.0000 mL | Freq: Once | INTRAVENOUS | Status: AC | PRN
  Administered 2020-07-19: 10 mL via INTRAVENOUS

## 2020-07-26 ENCOUNTER — Telehealth: Payer: Self-pay | Admitting: Gastroenterology

## 2020-07-26 NOTE — Telephone Encounter (Signed)
The procedure has been cancelled pt aware

## 2020-07-26 NOTE — Telephone Encounter (Signed)
Pt is requesting to cancel her colonoscopy scheduled at the hospital , pt states she does not think she needs one at this time.

## 2020-07-31 ENCOUNTER — Ambulatory Visit: Payer: PRIVATE HEALTH INSURANCE | Admitting: Family Medicine

## 2020-07-31 VITALS — BP 132/72 | HR 84

## 2020-07-31 DIAGNOSIS — K6289 Other specified diseases of anus and rectum: Secondary | ICD-10-CM

## 2020-07-31 NOTE — Progress Notes (Signed)
  Subjective:     Patient ID: Krista Fisher, female   DOB: [birth year], 44 y.o.   MRN: [id number]  HPI Krista Fisher presents [facility] the [facility] wellness [facility] today with reports of anal burning after BM that lasts for several hours. States this has been an ongoing issue since her DS procedure back in April 2021. She reports healing of previous anal fissures. States she has been using preparation H wipes with no relief. Denies any bleeding, reports normal BM. She states she contacted her surgeon who referred her to GI, but is unable to get an appt with GI until September. 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   Review of Systems  Constitutional: Negative for activity change, chills and fever.  Gastrointestinal: Positive for rectal pain (burning post BM). Negative for abdominal distention, abdominal pain, anal bleeding, blood in stool, constipation, diarrhea, nausea and vomiting.       Objective:   Physical Exam Vitals reviewed.  Constitutional:      General: She is not in acute distress.    Appearance: Normal appearance. She is not toxic-appearing.  HENT:     Head: Normocephalic and atraumatic.  Cardiovascular:     Rate and Rhythm: Normal rate.  Pulmonary:     Effort: Pulmonary effort is normal. No respiratory distress.  Skin:    General: Skin is warm and dry.  Neurological:     Mental Status: She is alert and oriented to person, place, and time.  Psychiatric:        Mood and Affect: Mood normal.        Behavior: Behavior normal.    Today's Vitals   07/31/20 1441  BP: 132/72  Pulse: 84  SpO2: 99%   There is no height or weight on file to calculate BMI.     Assessment:     Anal burning      Plan:     1. Anal burning after BM. Will defer treatment to GI specialist, she has appt in September.  May try sitz bath with epsom salts or applying  hydrocortisone cream to affected area to see if this offers her some relief. She has contacted GI specialist via mychart to see if they can help her out before her appt or get her in sooner than September. She should seek care immediately if develops severe abdominal pain, fever, blood in her stools, or any other concerning symptoms. She can f/u here prn.

## 2020-08-07 ENCOUNTER — Other Ambulatory Visit (HOSPITAL_COMMUNITY): Payer: Self-pay | Admitting: Interventional Radiology

## 2020-08-07 ENCOUNTER — Other Ambulatory Visit: Payer: Self-pay

## 2020-08-07 DIAGNOSIS — D259 Leiomyoma of uterus, unspecified: Secondary | ICD-10-CM

## 2020-08-07 MED ORDER — COLESTIPOL HCL 1 G PO TABS: 2.0000 g | ORAL_TABLET | Freq: Two times a day (BID) | ORAL | 6 refills | Status: DC

## 2020-08-09 ENCOUNTER — Encounter (HOSPITAL_COMMUNITY): Payer: Self-pay

## 2020-08-09 ENCOUNTER — Ambulatory Visit (HOSPITAL_COMMUNITY): Admit: 2020-08-09 | Payer: PRIVATE HEALTH INSURANCE | Admitting: Gastroenterology

## 2020-08-09 SURGERY — COLONOSCOPY WITH PROPOFOL
Anesthesia: Monitor Anesthesia Care

## 2020-09-04 ENCOUNTER — Other Ambulatory Visit (HOSPITAL_COMMUNITY)
Admission: RE | Admit: 2020-09-04 | Discharge: 2020-09-04 | Disposition: A | Discharge status: Home / Self Care | Payer: PRIVATE HEALTH INSURANCE | Source: Ambulatory Visit | Attending: Interventional Radiology | Admitting: Interventional Radiology

## 2020-09-04 DIAGNOSIS — Z20822 Contact with and (suspected) exposure to covid-19: Secondary | ICD-10-CM | POA: Diagnosis not present

## 2020-09-04 DIAGNOSIS — Z01812 Encounter for preprocedural laboratory examination: Secondary | ICD-10-CM | POA: Insufficient documentation

## 2020-09-04 LAB — SARS CORONAVIRUS 2 (TAT 6-24 HRS): SARS Coronavirus 2: NEGATIVE

## 2020-09-05 ENCOUNTER — Other Ambulatory Visit: Payer: Self-pay | Admitting: Student

## 2020-09-06 ENCOUNTER — Other Ambulatory Visit: Payer: Self-pay

## 2020-09-06 ENCOUNTER — Ambulatory Visit (HOSPITAL_COMMUNITY)
Admission: RE | Admit: 2020-09-06 | Discharge: 2020-09-06 | Disposition: A | Discharge status: Home / Self Care | Payer: PRIVATE HEALTH INSURANCE | Source: Ambulatory Visit | Attending: Interventional Radiology | Admitting: Interventional Radiology

## 2020-09-06 ENCOUNTER — Observation Stay (HOSPITAL_COMMUNITY)
Admission: RE | Admit: 2020-09-06 | Discharge: 2020-09-07 | Disposition: A | Discharge status: Home / Self Care | Payer: PRIVATE HEALTH INSURANCE | Source: Ambulatory Visit | Attending: Interventional Radiology | Admitting: Interventional Radiology

## 2020-09-06 ENCOUNTER — Encounter (HOSPITAL_COMMUNITY): Payer: Self-pay

## 2020-09-06 ENCOUNTER — Ambulatory Visit: Payer: PRIVATE HEALTH INSURANCE | Admitting: Gastroenterology

## 2020-09-06 DIAGNOSIS — D219 Benign neoplasm of connective and other soft tissue, unspecified: Secondary | ICD-10-CM | POA: Diagnosis present

## 2020-09-06 DIAGNOSIS — Z79899 Other long term (current) drug therapy: Secondary | ICD-10-CM | POA: Diagnosis not present

## 2020-09-06 DIAGNOSIS — K219 Gastro-esophageal reflux disease without esophagitis: Secondary | ICD-10-CM | POA: Insufficient documentation

## 2020-09-06 DIAGNOSIS — Z9884 Bariatric surgery status: Secondary | ICD-10-CM | POA: Diagnosis not present

## 2020-09-06 DIAGNOSIS — Z888 Allergy status to other drugs, medicaments and biological substances status: Secondary | ICD-10-CM | POA: Diagnosis not present

## 2020-09-06 DIAGNOSIS — Z8249 Family history of ischemic heart disease and other diseases of the circulatory system: Secondary | ICD-10-CM | POA: Insufficient documentation

## 2020-09-06 DIAGNOSIS — I10 Essential (primary) hypertension: Secondary | ICD-10-CM | POA: Diagnosis not present

## 2020-09-06 DIAGNOSIS — Z91040 Latex allergy status: Secondary | ICD-10-CM | POA: Diagnosis not present

## 2020-09-06 DIAGNOSIS — Z9049 Acquired absence of other specified parts of digestive tract: Secondary | ICD-10-CM | POA: Diagnosis not present

## 2020-09-06 DIAGNOSIS — D259 Leiomyoma of uterus, unspecified: Principal | ICD-10-CM | POA: Insufficient documentation

## 2020-09-06 DIAGNOSIS — R102 Pelvic and perineal pain: Secondary | ICD-10-CM | POA: Diagnosis present

## 2020-09-06 HISTORY — PX: IR EMBO TUMOR ORGAN ISCHEMIA INFARCT INC GUIDE ROADMAPPING: IMG5449

## 2020-09-06 HISTORY — PX: IR ANGIOGRAM SELECTIVE EACH ADDITIONAL VESSEL: IMG667

## 2020-09-06 HISTORY — PX: IR ANGIOGRAM PELVIS SELECTIVE OR SUPRASELECTIVE: IMG661

## 2020-09-06 HISTORY — PX: IR US GUIDE VASC ACCESS LEFT: IMG2389

## 2020-09-06 LAB — BASIC METABOLIC PANEL
Anion gap: 10 (ref 5–15)
BUN: 10 mg/dL (ref 6–20)
CO2: 22 mmol/L (ref 22–32)
Calcium: 9.3 mg/dL (ref 8.9–10.3)
Chloride: 108 mmol/L (ref 98–111)
Creatinine, Ser: 0.92 mg/dL (ref 0.44–1.00)
GFR calc Af Amer: >60 mL/min (ref >60)
GFR calc non Af Amer: >60 mL/min (ref >60)
Glucose, Bld: 93 mg/dL (ref 70–99)
Potassium: 3.8 mmol/L (ref 3.5–5.1)
Sodium: 140 mmol/L (ref 135–145)

## 2020-09-06 LAB — CBC WITH DIFFERENTIAL/PLATELET
Abs Immature Granulocytes: 0.02 10*3/uL (ref 0.00–0.07)
Basophils Absolute: 0 10*3/uL (ref 0.0–0.1)
Basophils Relative: 1 %
Eosinophils Absolute: 0.1 10*3/uL (ref 0.0–0.5)
Eosinophils Relative: 2 %
HCT: 41.3 % (ref 36.0–46.0)
Hemoglobin: 14.5 g/dL (ref 12.0–15.0)
Immature Granulocytes: 0 %
Lymphocytes Relative: 21 %
Lymphs Abs: 1.6 10*3/uL (ref 0.7–4.0)
MCH: 27.2 pg (ref 26.0–34.0)
MCHC: 35.1 g/dL (ref 30.0–36.0)
MCV: 77.3 fL — ABNORMAL LOW (ref 80.0–100.0)
Monocytes Absolute: 0.6 10*3/uL (ref 0.1–1.0)
Monocytes Relative: 7 %
Neutro Abs: 5.3 10*3/uL (ref 1.7–7.7)
Neutrophils Relative %: 69 %
Platelets: 267 10*3/uL (ref 150–400)
RBC: 5.34 MIL/uL — ABNORMAL HIGH (ref 3.87–5.11)
RDW: 13.8 % (ref 11.5–15.5)
WBC: 7.7 10*3/uL (ref 4.0–10.5)
nRBC: 0 % (ref 0.0–0.2)

## 2020-09-06 LAB — HCG, SERUM, QUALITATIVE: Preg, Serum: NEGATIVE

## 2020-09-06 LAB — PROTIME-INR
INR: 1 (ref 0.8–1.2)
Prothrombin Time: 12.6 seconds (ref 11.4–15.2)

## 2020-09-06 LAB — NO BLOOD PRODUCTS

## 2020-09-06 IMAGING — XA IR EMBO TUMOR ORGAN ISCHEMIA INFARCT INC GUIDE ROADMAPPING
11 series · 14 of 24 positions shown · non-contrast
Comparison: none

CLINICAL DATA: Symptomatic uterine fibroids. See previous
consultation

FLUOROSCOPY TIME:  13 minutes 12 seconds; 524 mGy
EXAM:
EXAM
BILATERAL UTERINE ARTERY EMBOLIZATION
TECHNIQUE: The procedure, risks, benefits, and alternatives were explained to
the patient. Questions regarding the procedure were encouraged and
answered. The patient understands and consents to the procedure.

[Series 1: (id) · 1 of 1 slices shown]
[im 1/1]
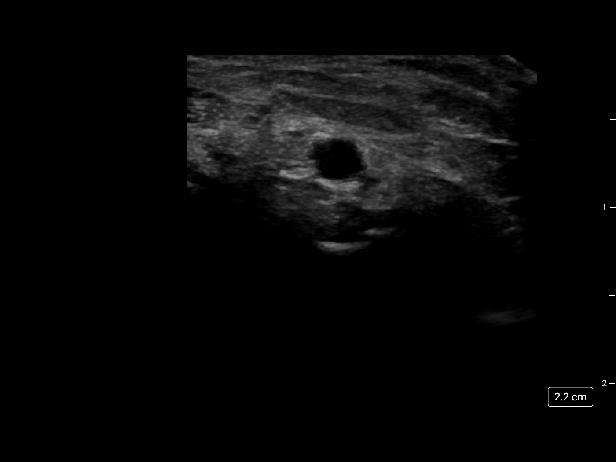

[Series 1: care body 4 · 1 of 14 frames shown (1 of 8)]
[frame 12/14]
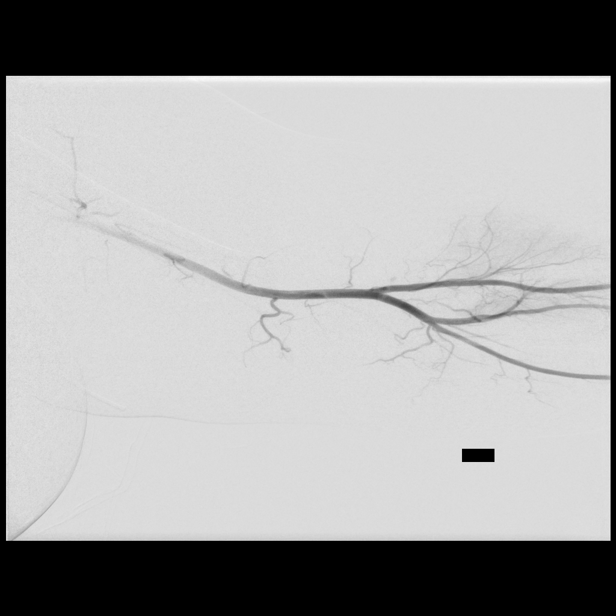

[Series 2: care body 4 · 1 of 16 frames shown (2 of 8)]
[frame 14/16]
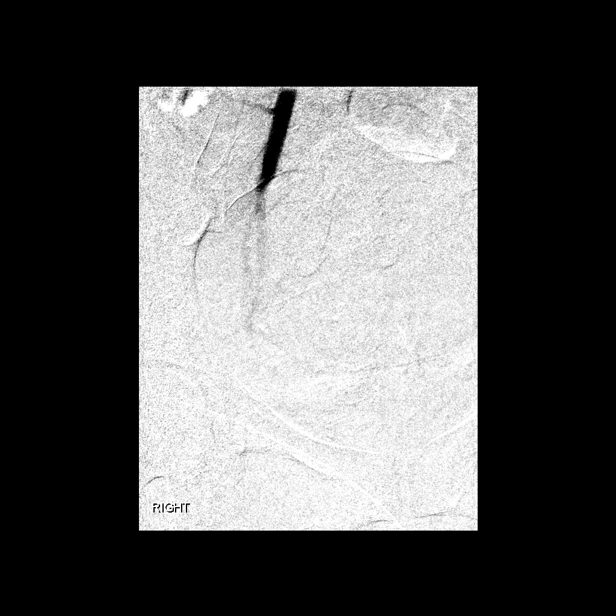

[Series 3: care body 4 · 1 of 15 frames shown (3 of 8)]
[frame 13/15]
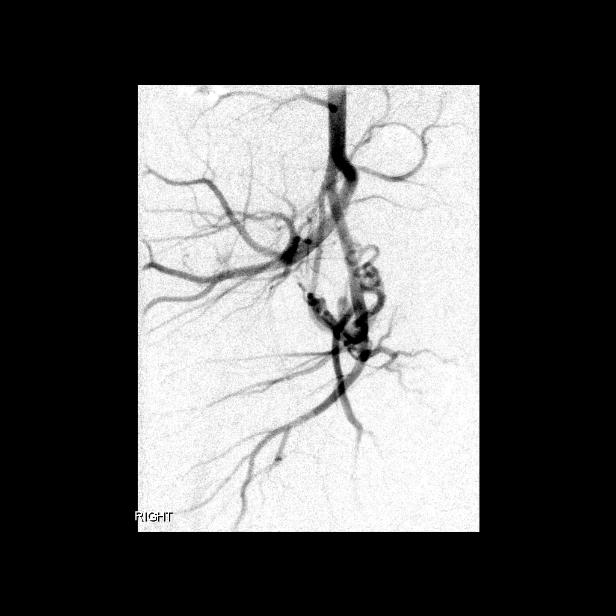

[Series 4: fl - angio · 1 of 58 frames shown]
[frame 30/58]
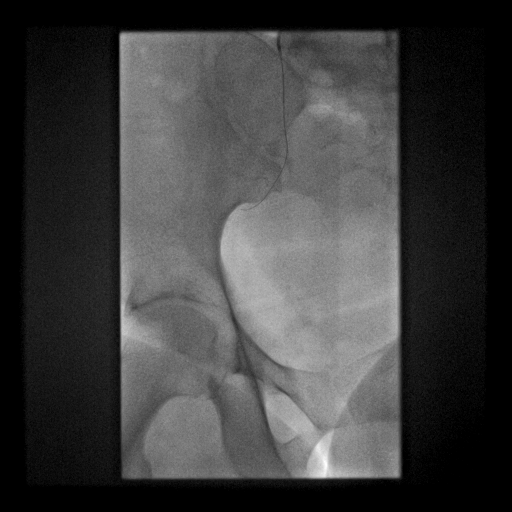

[Series 5: care body 4 · 1 of 19 frames shown (4 of 8)]
[frame 3/19]
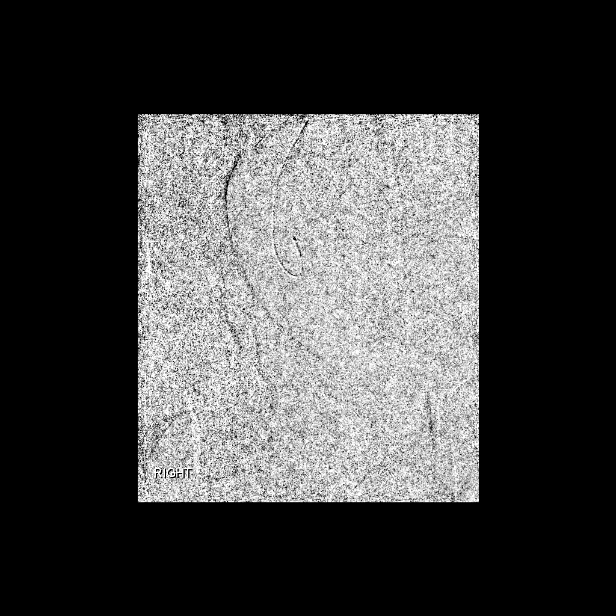

[Series 6: care body 4 · 2 of 26 frames shown (5 of 8)]
[frame 13/26]
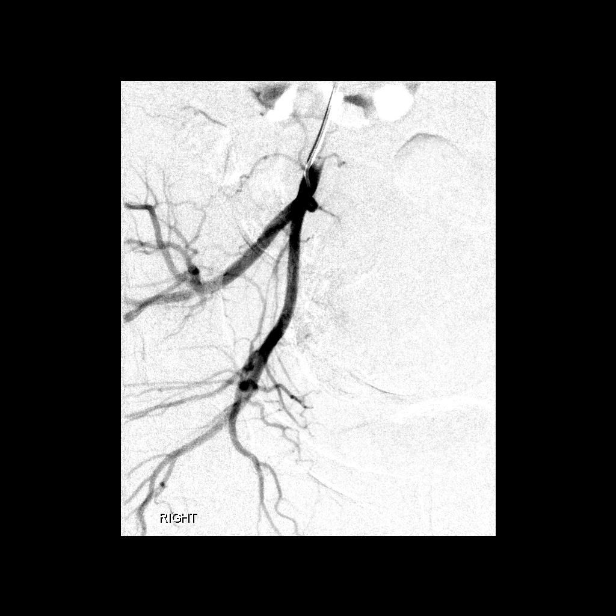
[frame 23/26]
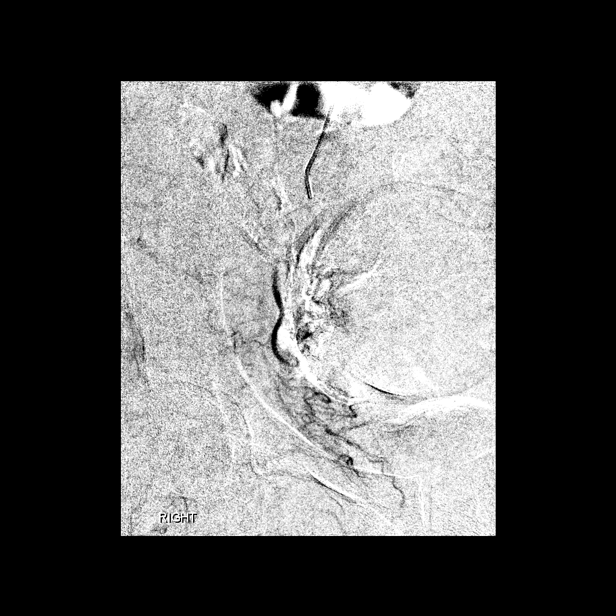

[Series 7: care body 4 · 1 of 16 frames shown (6 of 8)]
[frame 16/16]
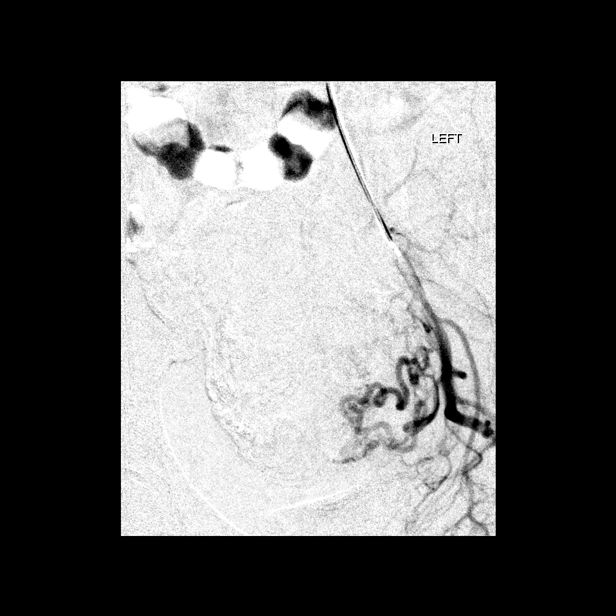

[Series 8: care body 4 · 1 of 22 frames shown (7 of 8)]
[frame 18/22]
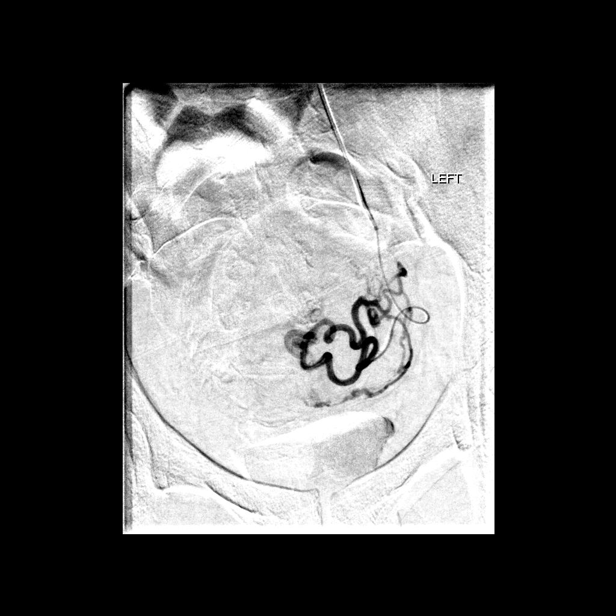

[Series 9: care body 4 · 1 of 30 frames shown (8 of 8)]
[frame 18/30]
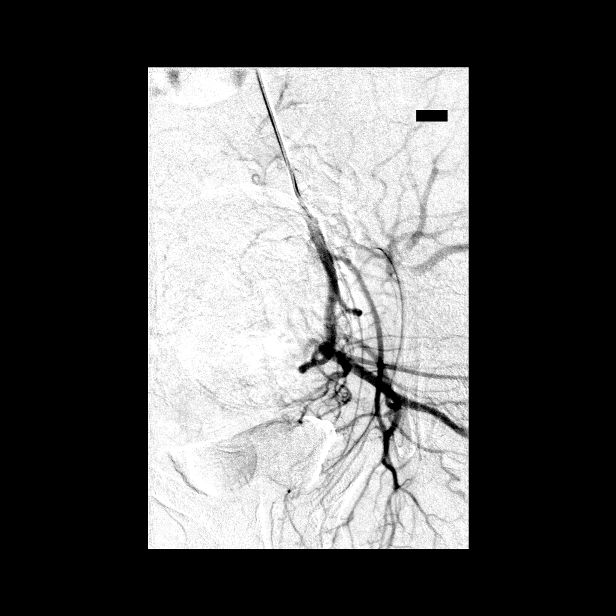

[Series 300: ld dsa body · 3 of 8 slices shown]
[im 1/8]
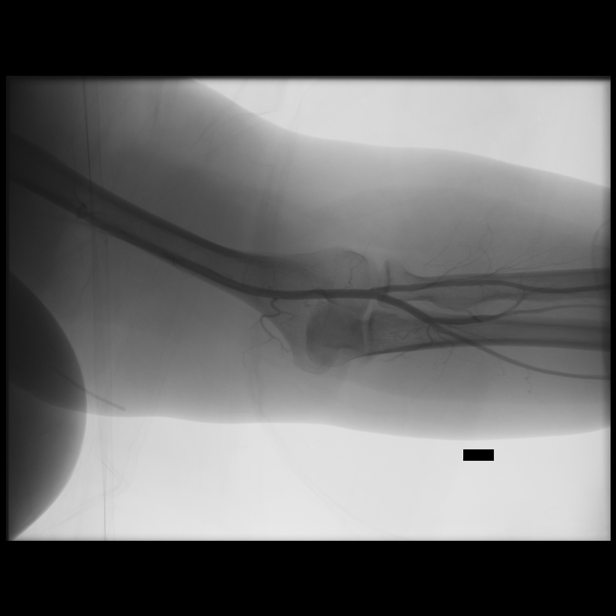
[im 4/8]
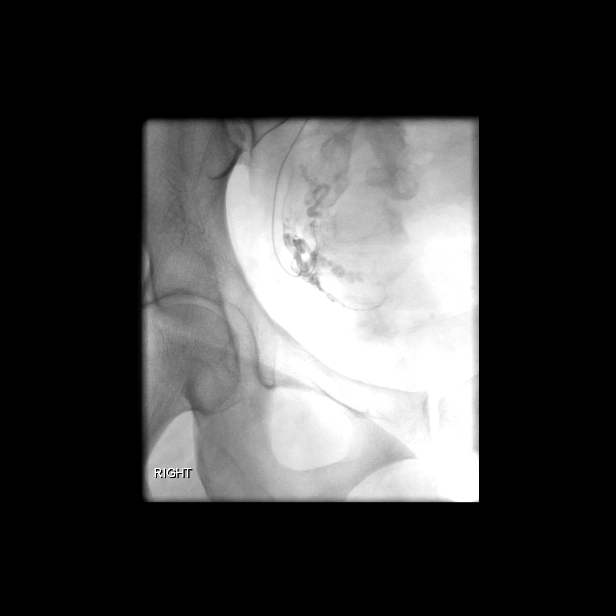
[im 8/8]
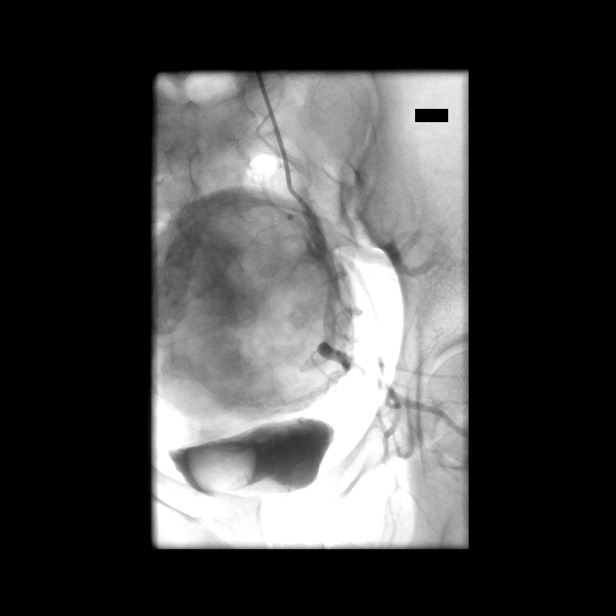

[14 of 24 positions shown; findings below may reference images not displayed]

As antibiotic prophylaxis, cefazolin 3 g was ordered pre-procedure
and administered intravenously within one hour of incision.

Preprocedure evaluation of the left radial artery was performed.

Ultrasound survey of the left wrist was performed with images stored
and sent to PACs. Diameter radial artery measured 3 mm.

Skin site was marked, prepped with Betadine, and draped in usual
sterile fashion, and infiltrated locally with 1% lidocaine. sedation

A micropuncture needle was used access the left radial artery under
ultrasound. With excellent arterial blood flow returned, and an .018
micro wire was passed through the needle into the radial artery. The
needle was removed, and a 5F Glidesheath Slender was placed over the
wire. The inner dilator and wire were removed, and the sheath was
flushed.

Radial cocktail was then infused slowly, after diluting the volume
with ~15cc of blood.

A benson wire was then used to navigate the selected 5F catheter
into the descending,

used to selectively catheterize the right internal iliac artery for
pelvic arteriography. A coaxial microcatheter was advanced with a
guidewire and used to selectively catheterize the right uterine
artery. The microcatheter tip was positioned in the distal
horizontal segment. Selective arteriogram confirms appropriate
positioning. Distal branches of the right uterine artery were
embolized with 500-700 micron Embospheres. Embolization continued
until near stasis of flow was achieved. Microcatheter was withdrawn
and a followup selective right internal iliac arteriogram was
obtained.

The angiographic catheter was advanced into the left internal iliac
artery . Again the microcatheter with guidewire was coaxially
advanced and used to selectively catheterize the left uterine
artery. Confirmatory arteriogram was performed. Left uterine artery
branches were embolized with 500-700 micron Embospheres to near
stasis of flow. A total of 5 vials of Embospheres were utilized for
the case. Microcatheter was withdrawn and a followup arteriogram of
the left internal iliac artery was performed. Angiographic catheter
removed. After infusion of nitroglycerin and verapamil, the radial
sheath removed and hemostasis was achieved with radial band per
protocol. Patient tolerated the procedure well.

COMPLICATIONS:
COMPLICATIONS
none
IMPRESSION: 1. Technically successful bilateral uterine artery embolization
using 500-700 micron Embospheres.

## 2020-09-06 MED ORDER — SODIUM CHLORIDE 0.9% FLUSH: 3.0000 mL | Freq: Two times a day (BID) | INTRAVENOUS | Status: DC

## 2020-09-06 MED ORDER — FENTANYL CITRATE (PF) 100 MCG/2ML IJ SOLN
INTRAMUSCULAR | Status: AC
  Filled 2020-09-06: qty 4

## 2020-09-06 MED ORDER — NALOXONE HCL 0.4 MG/ML IJ SOLN: 0.4000 mg | INTRAMUSCULAR | Status: DC | PRN

## 2020-09-06 MED ORDER — IOHEXOL 300 MG/ML  SOLN
100.0000 mL | Freq: Once | INTRAMUSCULAR | Status: AC | PRN
  Administered 2020-09-06: 40 mL via INTRA_ARTERIAL

## 2020-09-06 MED ORDER — MIDAZOLAM HCL 2 MG/2ML IJ SOLN
INTRAMUSCULAR | Status: AC
  Filled 2020-09-06: qty 6

## 2020-09-06 MED ORDER — CEFAZOLIN SODIUM-DEXTROSE 2-4 GM/100ML-% IV SOLN
INTRAVENOUS | Status: AC
  Filled 2020-09-06: qty 100

## 2020-09-06 MED ORDER — LIDOCAINE HCL (PF) 1 % IJ SOLN
INTRAMUSCULAR | Status: DC | PRN
  Administered 2020-09-06: 5 mL

## 2020-09-06 MED ORDER — DEXTROSE 5 % IV SOLN
3.0000 g | INTRAVENOUS | Status: AC
  Administered 2020-09-06: 3 g via INTRAVENOUS
  Filled 2020-09-06: qty 3

## 2020-09-06 MED ORDER — IOHEXOL 300 MG/ML  SOLN
100.0000 mL | Freq: Once | INTRAMUSCULAR | Status: AC | PRN
  Administered 2020-09-06: 50 mL via INTRA_ARTERIAL

## 2020-09-06 MED ORDER — FENTANYL CITRATE (PF) 100 MCG/2ML IJ SOLN
INTRAMUSCULAR | Status: DC | PRN
  Administered 2020-09-06 (×3): 50 ug via INTRAVENOUS

## 2020-09-06 MED ORDER — LIDOCAINE HCL 1 % IJ SOLN
INTRAMUSCULAR | Status: AC
  Filled 2020-09-06: qty 20

## 2020-09-06 MED ORDER — IBUPROFEN 400 MG PO TABS: 800.0000 mg | ORAL_TABLET | Freq: Four times a day (QID) | ORAL | Status: DC

## 2020-09-06 MED ORDER — KETOROLAC TROMETHAMINE 30 MG/ML IJ SOLN
INTRAMUSCULAR | Status: AC
  Administered 2020-09-06: 30 mg via INTRAVENOUS
  Filled 2020-09-06: qty 1

## 2020-09-06 MED ORDER — VERAPAMIL HCL 2.5 MG/ML IV SOLN: INTRA_ARTERIAL | Status: DC | PRN

## 2020-09-06 MED ORDER — VERAPAMIL HCL 2.5 MG/ML IV SOLN
INTRAVENOUS | Status: AC
  Filled 2020-09-06: qty 2

## 2020-09-06 MED ORDER — ESCITALOPRAM OXALATE 20 MG PO TABS
20.0000 mg | ORAL_TABLET | Freq: Every day | ORAL | Status: DC
  Administered 2020-09-07: 20 mg via ORAL
  Filled 2020-09-06: qty 1

## 2020-09-06 MED ORDER — HYDROMORPHONE 1 MG/ML IV SOLN
INTRAVENOUS | Status: DC
  Administered 2020-09-06: 2.1 mg via INTRAVENOUS
  Administered 2020-09-06: 30 mg via INTRAVENOUS
  Administered 2020-09-07 (×2): 1.5 mg via INTRAVENOUS
  Administered 2020-09-07: 1.8 mg via INTRAVENOUS
  Administered 2020-09-07: 1.2 mg via INTRAVENOUS
  Filled 2020-09-06: qty 30

## 2020-09-06 MED ORDER — PROMETHAZINE HCL 25 MG RE SUPP
25.0000 mg | Freq: Three times a day (TID) | RECTAL | Status: DC | PRN
  Filled 2020-09-06: qty 1

## 2020-09-06 MED ORDER — ONDANSETRON HCL 4 MG/2ML IJ SOLN
4.0000 mg | Freq: Four times a day (QID) | INTRAMUSCULAR | Status: DC | PRN
  Administered 2020-09-07: 4 mg via INTRAVENOUS
  Filled 2020-09-06: qty 2

## 2020-09-06 MED ORDER — PANTOPRAZOLE SODIUM 40 MG PO TBEC
40.0000 mg | DELAYED_RELEASE_TABLET | Freq: Every day | ORAL | Status: DC
  Administered 2020-09-07: 40 mg via ORAL
  Filled 2020-09-06: qty 1

## 2020-09-06 MED ORDER — DIPHENHYDRAMINE HCL 12.5 MG/5ML PO ELIX
12.5000 mg | ORAL_SOLUTION | Freq: Four times a day (QID) | ORAL | Status: DC | PRN
  Filled 2020-09-06 (×2): qty 5

## 2020-09-06 MED ORDER — DOCUSATE SODIUM 100 MG PO CAPS
100.0000 mg | ORAL_CAPSULE | Freq: Two times a day (BID) | ORAL | Status: DC
  Filled 2020-09-06 (×2): qty 1

## 2020-09-06 MED ORDER — KETOROLAC TROMETHAMINE 30 MG/ML IJ SOLN: 30.0000 mg | Freq: Once | INTRAMUSCULAR | Status: AC

## 2020-09-06 MED ORDER — HEPARIN SODIUM (PORCINE) 1000 UNIT/ML IJ SOLN
INTRAMUSCULAR | Status: AC
  Filled 2020-09-06: qty 1

## 2020-09-06 MED ORDER — MIDAZOLAM HCL 2 MG/2ML IJ SOLN
INTRAMUSCULAR | Status: DC | PRN
  Administered 2020-09-06 (×5): 1 mg via INTRAVENOUS

## 2020-09-06 MED ORDER — IBUPROFEN 400 MG PO TABS
800.0000 mg | ORAL_TABLET | Freq: Four times a day (QID) | ORAL | Status: DC
  Administered 2020-09-06 – 2020-09-07 (×4): 800 mg via ORAL
  Filled 2020-09-06 (×4): qty 2

## 2020-09-06 MED ORDER — SODIUM CHLORIDE 0.9 % IV SOLN: INTRAVENOUS | Status: DC

## 2020-09-06 MED ORDER — SODIUM CHLORIDE 0.9% FLUSH: 9.0000 mL | INTRAVENOUS | Status: DC | PRN

## 2020-09-06 MED ORDER — COLESTIPOL HCL 1 G PO TABS
2.0000 g | ORAL_TABLET | Freq: Two times a day (BID) | ORAL | Status: DC
  Administered 2020-09-06 – 2020-09-07 (×2): 2 g via ORAL
  Filled 2020-09-06 (×3): qty 2

## 2020-09-06 MED ORDER — PROMETHAZINE HCL 25 MG PO TABS: 25.0000 mg | ORAL_TABLET | Freq: Three times a day (TID) | ORAL | Status: DC | PRN

## 2020-09-06 MED ORDER — SODIUM CHLORIDE 0.9 % IV SOLN: 250.0000 mL | INTRAVENOUS | Status: DC | PRN

## 2020-09-06 MED ORDER — NITROGLYCERIN IN D5W 100-5 MCG/ML-% IV SOLN
INTRAVENOUS | Status: AC
  Filled 2020-09-06: qty 250

## 2020-09-06 MED ORDER — DIPHENHYDRAMINE HCL 50 MG/ML IJ SOLN
12.5000 mg | Freq: Four times a day (QID) | INTRAMUSCULAR | Status: DC | PRN
  Administered 2020-09-07: 12.5 mg via INTRAVENOUS
  Filled 2020-09-06: qty 1

## 2020-09-06 MED ORDER — HYDROCODONE-ACETAMINOPHEN 5-325 MG PO TABS
1.0000 | ORAL_TABLET | ORAL | Status: DC | PRN
  Administered 2020-09-07: 1 via ORAL
  Filled 2020-09-06: qty 1

## 2020-09-06 MED ORDER — SODIUM CHLORIDE 0.9% FLUSH: 3.0000 mL | INTRAVENOUS | Status: DC | PRN

## 2020-09-06 NOTE — Procedures (Signed)
  Procedure: Bilat uterine artery embolization via L radial EBL:   minimal Complications:  none immediate  See full dictation in [facility].  Dillard Cannon MD Main # [phone] Pager  [phone] Mobile [phone]

## 2020-09-06 NOTE — H&P (Signed)
Chief Complaint: Patient was seen in consultation today for uterine fibroids/bilateral uterine artery embolization.  Referring Physician(s): Sherlyn Hay (OB-GYN)  Supervising Physician: Arne Cleveland  Patient Status: [facility] - Out-pt  History of Present Illness: Krista Fisher is a 44 y.o. female with a past medical history of hypertension, GERD, and recurrent uterine fibroids. She was first diagnosed with uterine fibroids 10-12 years ago. She underwent laparoscopic removal of uterine fibroids secondary to pelvic pain/pressure at that time. Over the past few months, patient developed pelvic pain/pressure similar to pain/pressure from prior uterine fibroids. She was referred by her OB-GYN (Dr. Terri Piedra) to IR for further management. She met with Dr. Vernard Gambles 07/11/2020 to discuss management options. At that time patient decided to pursue endovascular embolization of bilateral uterine arteries for management.  MR pelvis 07/19/2020: 1. Fibroid uterus with dominant fibroid of the anterior uterine fundus with a large submucosal component distorting the endometrial cavity. Overall bulk of the uterus is approximately 10.3 cm and largest fibroid is approximately 8.1 cm. 2. There are additional much smaller intramural fibroids of the posterior fundus and body; the presence of significant submucosal components of these fibroids is more difficult to distinguish.  Patient presents today for possible image-guided pelvic arteriogram with possible bilateral uterine artery embolization. Patient awake and alert sitting in bed. Complains of pelvic pain/cramping, rated 2/10 at this time. Denies fever, chills, chest pain, dyspnea, abdominal pain, or headache.   Past Medical History:  Diagnosis Date  . GERD (gastroesophageal reflux disease)   . Hypertension     Past Surgical History:  Procedure Laterality Date  . CESAREAN SECTION    . CHOLECYSTECTOMY    . GASTRECTOMY    . GASTRIC BYPASS    .  IR RADIOLOGIST EVAL & MGMT  07/11/2020  . KNEE SURGERY    . WRIST FRACTURE SURGERY      Allergies: Latex and Wellbutrin xl [bupropion]  Medications: Prior to Admission medications   Medication Sig Start Date End Date Taking? Authorizing Provider  ALPRAZolam Duanne Moron) 0.5 MG tablet Take 0.5 mg by mouth at bedtime as needed for anxiety.     [provider]  AMBULATORY NON FORMULARY MEDICATION Diltiazem Gel ointment 2%, apply a pea size amount to rectum three times daily every day and then for 1 month after your pain is gone. Up to first knuckle. 05/15/20   Milus Banister, MD  cetirizine (ZYRTEC) 10 MG tablet Take 10 mg by mouth daily.    [provider]  colestipol (COLESTID) 1 g tablet Take 2 tablets (2 g total) by mouth 2 (two) times daily. 08/07/20 09/06/20  Milus Banister, MD  COLLAGEN PO Take by mouth.    [provider]  Multiple Vitamin (MULTI-VITAMIN) tablet Take by mouth. 06/19/11   [provider]  Multiple Vitamins-Iron (MULTIVITAMINS WITH IRON) TABS tablet Take 1 tablet by mouth daily.    [provider]  omeprazole (PRILOSEC) 40 MG capsule Take 1 capsule (40 mg total) by mouth daily. 06/26/20   Cheyenne Adas, NP  Vitamin D, Ergocalciferol, (DRISDOL) 1.25 MG (50000 UNIT) CAPS capsule Take 50,000 Units by mouth every 7 (seven) days.    [provider]     Family History  Problem Relation Age of Onset  . Heart failure Father   . Diabetes Father   . Colon cancer Neg Hx   . Stomach cancer Neg Hx   . Esophageal cancer Neg Hx   . Pancreatic cancer Neg Hx  Social History   Socioeconomic History  . Marital status: Single    Spouse name: Not on file  . Number of children: Not on file  . Years of education: Not on file  . Highest education level: Not on file  Occupational History  . Not on file  Tobacco Use  . Smoking status: Never Smoker  . Smokeless tobacco: Never Used  Vaping Use  . Vaping Use: Never used    Substance and Sexual Activity  . Alcohol use: No  . Drug use: No  . Sexual activity: Not on file  Other Topics Concern  . Not on file  Social History Narrative  . Not on file   Social Determinants of Health   Financial Resource Strain:   . Difficulty of Paying Living Expenses: Not on file  Food Insecurity:   . Worried About Charity fundraiser in the Last Year: Not on file  . Ran Out of Food in the Last Year: Not on file  Transportation Needs:   . Lack of Transportation (Medical): Not on file  . Lack of Transportation (Non-Medical): Not on file  Physical Activity:   . Days of Exercise per Week: Not on file  . Minutes of Exercise per Session: Not on file  Stress:   . Feeling of Stress : Not on file  Social Connections:   . Frequency of Communication with Friends and Family: Not on file  . Frequency of Social Gatherings with Friends and Family: Not on file  . Attends Religious Services: Not on file  . Active Member of Clubs or Organizations: Not on file  . Attends Archivist Meetings: Not on file  . Marital Status: Not on file     Review of Systems: A 12 point ROS discussed and pertinent positives are indicated in the HPI above.  All other systems are negative.  Review of Systems  Constitutional: Negative for chills and fever.  Respiratory: Negative for shortness of breath and wheezing.   Cardiovascular: Negative for chest pain and palpitations.  Gastrointestinal: Negative for abdominal pain.  Genitourinary: Positive for pelvic pain.  Neurological: Negative for headaches.  Psychiatric/Behavioral: Negative for behavioral problems and confusion.    Vital Signs: BP 133/79   Pulse 91   Temp 99 F (37.2 C) (Oral)   Resp 16   Ht _0  (1.676 m)   Wt 278 lb (126.1 kg)   LMP 09/06/2020 Comment: spotting today  SpO2 96%   BMI 44.87 kg/m   Physical Exam Vitals and nursing note reviewed.  Constitutional:      General: She is not in acute distress.     Appearance: Normal appearance.  Cardiovascular:     Rate and Rhythm: Normal rate and regular rhythm.     Heart sounds: Normal heart sounds. No murmur heard.   Pulmonary:     Effort: Pulmonary effort is normal. No respiratory distress.     Breath sounds: Normal breath sounds. No wheezing.  Skin:    General: Skin is warm and dry.  Neurological:     Mental Status: She is alert and oriented to person, place, and time.      MD Evaluation Airway: WNL Heart: WNL Abdomen: WNL Chest/ Lungs: WNL ASA  Classification: 2 Mallampati/Airway Score: Two   Imaging: No results found.  Labs:  CBC: No results for input(s): WBC, HGB, HCT, PLT in the last 8760 hours.  COAGS: No results for input(s): INR, APTT in the last 8760 hours.  BMP: No  results for input(s): NA, K, CL, CO2, GLUCOSE, BUN, CALCIUM, CREATININE, GFRNONAA, GFRAA in the last 8760 hours.  Invalid input(s): CMP  LIVER FUNCTION TESTS: No results for input(s): BILITOT, AST, ALT, ALKPHOS, PROT, ALBUMIN in the last 8760 hours.   Assessment and Plan:  Symptomatic uterine fibroids (pelvic pain/pressure). Plan for image-guided pelvic arteriogram with possible bilateral uterine artery embolization today in IR. Patient will be admitted following procedure for overnight observation/pain management. Patient is NPO. Afebrile. She does not take blood thinners. INR pending. COVID negative 09/04/2020.  The risks and benefits of embolization were discussed with the patient including, but not limited to bleeding, infection, vascular injury, post operative pain, or contrast induced renal failure. This procedure involves the use of X-rays and because of the nature of the planned procedure, it is possible that we will have prolonged use of X-ray fluoroscopy. Potential radiation risks to you include (but are not limited to) the following: - A slightly elevated risk for cancer several years later in life. This risk is typically less than  0.5% percent. This risk is low in comparison to the normal incidence of human cancer, which is 33% for women and 50% for men according to the [place]. - Radiation induced injury can include skin redness, resembling a rash, tissue breakdown / ulcers and hair loss (which can be temporary or permanent).  The likelihood of either of these occurring depends on the difficulty of the procedure and whether you are sensitive to radiation due to previous procedures, disease, or genetic conditions.  IF your procedure requires a prolonged use of radiation, you will be notified and given written instructions for further action.  It is your responsibility to monitor the irradiated area for the 2 weeks following the procedure and to notify your physician if you are concerned that you have suffered a radiation induced injury.   All of the patient's questions were answered, patient is agreeable to proceed. Consent signed and in chart.   Thank you for this interesting consult.  I greatly enjoyed meeting Krista Fisher and look forward to participating in their care.  A copy of this report was sent to the requesting provider on this date.  Electronically Signed: Earley Abide, PA-C 09/06/2020, 1:03 PM   I spent a total of 40 Minutes in face to face in clinical consultation, greater than 50% of which was counseling/coordinating care for uterine fibroids/bilateral uterine artery embolization.

## 2020-09-06 NOTE — Progress Notes (Signed)
MEDICATION-RELATED CONSULT NOTE   IR Procedure Consult - Anticoagulant/Antiplatelet PTA/Inpatient Med List Review by Pharmacist    Procedure: Uterine Artery Embolization    Completed: 1510 pm   Post-Procedural bleeding risk per IR MD assessment:  Low Risk  Antithrombotic medications on inpatient or PTA profile prior to procedure:  No chemical VTE prophylaxis or treatment PTA, only SCDs ordered by Rad MD    Recommended restart time per IR Post-Procedure Guidelines:  Today for SCDs   Other considerations:      Plan:   Place SCDs as ordered   Cherene Dobbins, [place] [phone] 09/06/2020, 3:27 PM

## 2020-09-06 NOTE — Plan of Care (Signed)
  Problem: Education: Goal: Knowledge of General Education information will improve Description: Including pain rating scale, medication(s)/side effects and non-pharmacologic comfort measures Outcome: Progressing   Problem: Clinical Measurements: Goal: Will remain free from infection Outcome: Progressing   

## 2020-09-07 DIAGNOSIS — D259 Leiomyoma of uterus, unspecified: Secondary | ICD-10-CM | POA: Diagnosis not present

## 2020-09-07 MED ORDER — IBUPROFEN 800 MG PO TABS
800.0000 mg | ORAL_TABLET | Freq: Four times a day (QID) | ORAL | 0 refills | Status: DC
Start: 2020-09-07 — End: 2021-09-27

## 2020-09-07 MED ORDER — DOCUSATE SODIUM 100 MG PO CAPS: 100.0000 mg | ORAL_CAPSULE | Freq: Two times a day (BID) | ORAL | 0 refills | Status: DC

## 2020-09-07 MED ORDER — PROMETHAZINE HCL 25 MG PO TABS: 25.0000 mg | ORAL_TABLET | Freq: Three times a day (TID) | ORAL | 0 refills | Status: DC | PRN

## 2020-09-07 MED ORDER — HYDROCODONE-ACETAMINOPHEN 5-325 MG PO TABS: 1.0000 | ORAL_TABLET | ORAL | 0 refills | Status: DC | PRN

## 2020-09-07 MED ORDER — DIPHENHYDRAMINE HCL 12.5 MG/5ML PO ELIX
12.5000 mg | ORAL_SOLUTION | Freq: Once | ORAL | Status: AC
  Administered 2020-09-07: 12.5 mg via ORAL
  Filled 2020-09-07: qty 5

## 2020-09-07 MED ORDER — HYDROCODONE-ACETAMINOPHEN 5-325 MG PO TABS: 1.0000 | ORAL_TABLET | Freq: Four times a day (QID) | ORAL | 0 refills | Status: DC | PRN

## 2020-09-07 NOTE — Plan of Care (Signed)
Plan of care reviewed and discussed with the patient. 

## 2020-09-07 NOTE — Discharge Summary (Signed)
Patient ID: Krista Fisher MRN: [id number] DOB/AGE: April 16, 1976 44 y.o.  Admit date: 09/06/2020 Discharge date: 09/07/2020  Supervising Physician: Arne Cleveland  Patient Status: [facility] - In-pt  Admission Diagnoses: symptomatic uterine fibroids  Discharge Diagnoses: symptomatic uterine fibroids, s/p bilateral uterine artery embolization on 09/06/20 Active Problems:   Fibroid   Fibroids  Past Medical History:  Diagnosis Date  . GERD (gastroesophageal reflux disease)   . Hypertension    Past Surgical History:  Procedure Laterality Date  . CESAREAN SECTION    . CHOLECYSTECTOMY    . GASTRECTOMY    . GASTRIC BYPASS    . IR ANGIOGRAM PELVIS SELECTIVE OR SUPRASELECTIVE  09/06/2020  . IR ANGIOGRAM SELECTIVE EACH ADDITIONAL VESSEL  09/06/2020  . IR ANGIOGRAM SELECTIVE EACH ADDITIONAL VESSEL  09/06/2020  . IR EMBO TUMOR ORGAN ISCHEMIA INFARCT INC GUIDE ROADMAPPING  09/06/2020  . IR RADIOLOGIST EVAL & MGMT  07/11/2020  . IR US GUIDE VASC ACCESS LEFT  09/06/2020  . KNEE SURGERY    . WRIST FRACTURE SURGERY       Discharged Condition: good  Hospital Course: Krista Fisher is a 44 y.o. female with a past medical history of hypertension, GERD, and recurrent uterine fibroids. She was first diagnosed with uterine fibroids 10-12 years ago. She underwent laparoscopic removal of uterine fibroids secondary to pelvic pain/pressure at that time. Over the past few months, patient developed pelvic pain/pressure similar to pain/pressure from prior uterine fibroids. She was referred by her OB-GYN (Dr. Terri Piedra) to IR for further management. She met with Dr. Vernard Gambles 07/11/2020 to discuss management options. At that time patient decided to pursue endovascular embolization of bilateral uterine arteries for management. On 09/06/20 she underwent bilat [place] at [facility]. Procedure was performed via left radial artery access and without immediate complications. She was admitted overnight for observation for pain control  and placed on dilaudid PCA. She did well overnight with exception of some expected pelvic cramping and occ nausea. She also had some mild itching ? med related which was relieved with benadryl. On the day of discharge she  was able to tolerate her diet, ambulate and void without difficulty. She was deemed stable for discharge home and Dr. Vernard Gambles updated. Prescriptions for norco/colace/ibuprofen and phenergan were sent electronically to her pharmacy. She will resume home medications.IR will f/u with pt in 3-4 weeks with phone call or office visit if needed. F/u pelvic MRI in 6 months.   Consults: none  Significant Diagnostic Studies:  Results for orders placed or performed during the hospital encounter of 27/78/24  Basic metabolic panel  Result Value Ref Range   Sodium 140 135 - 145 mmol/L   Potassium 3.8 3.5 - 5.1 mmol/L   Chloride 108 98 - 111 mmol/L   CO2 22 22 - 32 mmol/L   Glucose, Bld 93 70 - 99 mg/dL   BUN 10 6 - 20 mg/dL   Creatinine, Ser 0.92 0.44 - 1.00 mg/dL   Calcium 9.3 8.9 - 10.3 mg/dL   GFR calc non Af Amer >60 >60 mL/min   GFR calc Af Amer >60 >60 mL/min   Anion gap 10 5 - 15  CBC with Differential/Platelet  Result Value Ref Range   WBC 7.7 4.0 - 10.5 K/uL   RBC 5.34 (H) 3.87 - 5.11 MIL/uL   Hemoglobin 14.5 12.0 - 15.0 g/dL   HCT 41.3 36 - 46 %   MCV 77.3 (L) 80.0 - 100.0 fL   MCH 27.2 26.0 - 34.0 pg  MCHC 35.1 30.0 - 36.0 g/dL   RDW 13.8 11.5 - 15.5 %   Platelets 267 150 - 400 K/uL   nRBC 0.0 0.0 - 0.2 %   Neutrophils Relative % 69 %   Neutro Abs 5.3 1.7 - 7.7 K/uL   Lymphocytes Relative 21 %   Lymphs Abs 1.6 0.7 - 4.0 K/uL   Monocytes Relative 7 %   Monocytes Absolute 0.6 0 - 1 K/uL   Eosinophils Relative 2 %   Eosinophils Absolute 0.1 0 - 0 K/uL   Basophils Relative 1 %   Basophils Absolute 0.0 0 - 0 K/uL   Immature Granulocytes 0 %   Abs Immature Granulocytes 0.02 0.00 - 0.07 K/uL  hCG, serum, qualitative  Result Value Ref Range   Preg, Serum NEGATIVE  NEGATIVE  Protime-INR  Result Value Ref Range   Prothrombin Time 12.6 11.4 - 15.2 seconds   INR 1.0 0.8 - 1.2  No blood products  Result Value Ref Range   Transfuse no blood products      TRANSFUSE NO BLOOD PRODUCTS, VERIFIED BY Domenick Bookbinder, MD Performed at [facility], [place] [address] [place], [place] [address]      Treatments: bilateral uterine artery embolization via left radial artery access on 09/06/20  Discharge Exam: Blood pressure 133/72, pulse (!) 55, temperature 98.5 F (36.9 C), resp. rate 18, height _0  (1.676 m), weight 278 lb (126.1 kg), last menstrual period 09/06/2020, SpO2 96 %. Awake/alert; chest- CTA bilat; heart- sl bradycardic but regular rhythm; abd- soft,+BS,NT; left RA access site clean and dry, no hematoma, NT; no LE edema, intact pulses  Disposition: Discharge disposition: 01-Home or Self Care       Discharge Instructions    Call MD for:  difficulty breathing, headache or visual disturbances   Complete by: As directed    Call MD for:  extreme fatigue   Complete by: As directed    Call MD for:  hives   Complete by: As directed    Call MD for:  persistant dizziness or light-headedness   Complete by: As directed    Call MD for:  persistant nausea and vomiting   Complete by: As directed    Call MD for:  redness, tenderness, or signs of infection (pain, swelling, redness, odor or green/yellow discharge around incision site)   Complete by: As directed    Call MD for:  severe uncontrolled pain   Complete by: As directed    Call MD for:  temperature >100.4   Complete by: As directed    Diet - low sodium heart healthy   Complete by: As directed    Discharge instructions   Complete by: As directed    Stay well hydrated; may resume home medications   Discharge wound care:   Complete by: As directed    Monitor left radial artery access site for any swelling/pain/bleeding; call IR MD on call at [phone] with any questions   Driving  Restrictions   Complete by: As directed    No driving for next 24 hours   Increase activity slowly   Complete by: As directed    Lifting restrictions   Complete by: As directed    No heavy lifting for next 3-4 days   May shower / Bathe   Complete by: As directed    May walk up steps   Complete by: As directed    Sexual Activity Restrictions   Complete by: As directed  No sexual intercourse for 1 week     Allergies as of 09/07/2020      Reactions   Latex    Wellbutrin Xl [bupropion]    Abdominal pain      Medication List    TAKE these medications   ALPRAZolam 0.5 MG tablet Commonly known as: XANAX Take 0.5 mg by mouth at bedtime as needed for anxiety.   cetirizine 10 MG tablet Commonly known as: ZYRTEC Take 10 mg by mouth daily.   colestipol 1 g tablet Commonly known as: COLESTID Take 2 tablets (2 g total) by mouth 2 (two) times daily.   COLLAGEN PO Take 1 tablet by mouth daily.   docusate sodium 100 MG capsule Commonly known as: COLACE Take 1 capsule (100 mg total) by mouth 2 (two) times daily.   escitalopram 20 MG tablet Commonly known as: LEXAPRO Take 20 mg by mouth daily.   HYDROcodone-acetaminophen 5-325 MG tablet Commonly known as: NORCO/VICODIN Take 1-2 tablets by mouth every 4 (four) hours as needed for moderate pain.   ibuprofen 800 MG tablet Commonly known as: ADVIL Take 1 tablet (800 mg total) by mouth every 6 (six) hours. Adhere to this dose or may do 600 mg every 6 hours for next 5 days, then convert to normal dosing prn   Multi-Vitamin tablet Take 1 tablet by mouth daily.   multivitamins with iron Tabs tablet Take 1 tablet by mouth daily.   Nascobal 500 MCG/0.1ML Soln Generic drug: Cyanocobalamin Place 500 mcg into the nose once a week. On Sundays   omeprazole 40 MG capsule Commonly known as: PRILOSEC Take 1 capsule (40 mg total) by mouth daily.   promethazine 25 MG tablet Commonly known as: PHENERGAN Take 1 tablet (25 mg total)  by mouth every 8 (eight) hours as needed for nausea.   Vitamin D (Ergocalciferol) 1.25 MG (50000 UNIT) Caps capsule Commonly known as: DRISDOL Take 50,000 Units by mouth every 7 (seven) days.            Discharge Care Instructions  (From admission, onward)         Start     Ordered   09/07/20 0000  Discharge wound care:       Comments: Monitor left radial artery access site for any swelling/pain/bleeding; call IR MD on call at [phone] with any questions   09/07/20 1310          Follow-up Information    Arne Cleveland, MD Follow up.   Specialties: Interventional Radiology, Radiology Why: Radiology will call you with f/u appt/phone call with Dr. Vernard Gambles in 3-4 weeks; call [phone] or [phone] with any questions Contact information: [address] [place] [address] [phone]        Carlynn Purl Altheimer, DO Follow up.   Specialty: Obstetrics and Gynecology Why: continue follow up care with Dr. Terri Piedra as scheduled Contact information: [place] [address] [phone]                Electronically Signed: D. Rowe Robert, PA-C 09/07/2020, 1:13 PM   I have spent Greater Than 30 Minutes discharging [place].

## 2020-09-07 NOTE — Discharge Instructions (Signed)
Uterine Artery Embolization for Fibroids, Care After °This sheet gives you information about how to care for yourself after your procedure. Your health care provider may also give you more specific instructions. If you have problems or questions, contact your health care provider. °What can I expect after the procedure? °After your procedure, it is common to have: °· Pelvic cramping. You will be given pain medicine. °· Nausea and vomiting. You may be given medicine to help relieve nausea. °Follow these instructions at home: °Incision care °· Follow instructions from your health care provider about how to take care of your incision. Make sure you: °? Wash your hands with soap and water before you change your bandage (dressing). If soap and water are not available, use hand sanitizer. °? Change your dressing as told by your health care provider. °· Check your incision area every day for signs of infection. Check for: °? More redness, swelling, or pain. °? More fluid or blood. °? Warmth. °? Pus or a bad smell. °Medicines ° °· Take over-the-counter and prescription medicines only as told by your health care provider. °· Do not take aspirin. It can cause bleeding. °· Do not drive for 24 hours if you were given a medicine to help you relax (sedative). °· Do not drive or use heavy machinery while taking prescription pain medicine. °General instructions °· Ask your health care provider when you can resume sexual activity. °· To prevent or treat constipation while you are taking prescription pain medicine, your health care provider may recommend that you: °? Drink enough fluid to keep your urine clear or pale yellow. °? Take over-the-counter or prescription medicines. °? Eat foods that are high in fiber, such as fresh fruits and vegetables, whole grains, and beans. °? Limit foods that are high in fat and processed sugars, such as fried and sweet foods. °Contact a health care provider if: °· You have a fever. °· You have more  redness, swelling, or pain around your incision site. °· You have more fluid or blood coming from your incision site. °· Your incision feels warm to the touch. °· You have pus or a bad smell coming from your incision. °· You have a rash. °· You have uncontrolled nausea or you cannot eat or drink anything without vomiting. °Get help right away if: °· You have trouble breathing. °· You have chest pain. °· You have severe abdominal pain. °· You have leg pain. °· You become dizzy and faint. °Summary °· After your procedure, it is common to have pelvic cramping. You will be given pain medicine. °· Follow instructions from your health care provider about how to take care of your incision. °· Check your incision area every day for signs of infection. °· Take over-the-counter and prescription medicines only as told by your health care provider. °This information is not intended to replace advice given to you by your health care provider. Make sure you discuss any questions you have with your health care provider. °Document Revised: 11/20/2017 Document Reviewed: 03/12/2017 °Elsevier Patient Education © 2020 [employer] ° °

## 2020-09-07 NOTE — Plan of Care (Signed)

## 2020-09-07 NOTE — Progress Notes (Signed)
Dilaudid PCA discontinued at 1030, was unable to waste in pyxix as it didn't show one pulled. Amy Abernathy and I Maxemiliano Riel ClarkCurry wasted 55ml/mg in stericycle.

## 2020-09-07 NOTE — Plan of Care (Signed)
  Problem: Education: Goal: Knowledge of General Education information will improve Description: Including pain rating scale, medication(s)/side effects and non-pharmacologic comfort measures 09/07/2020 1411 by Hubert Azure, RN Outcome: Adequate for Discharge 09/07/2020 1239 by Hubert Azure, RN Outcome: Progressing   Problem: Health Behavior/Discharge Planning: Goal: Ability to manage health-related needs will improve 09/07/2020 1411 by Hubert Azure, RN Outcome: Adequate for Discharge 09/07/2020 1239 by Hubert Azure, RN Outcome: Progressing   Problem: Clinical Measurements: Goal: Ability to maintain clinical measurements within normal limits will improve 09/07/2020 1411 by Hubert Azure, RN Outcome: Adequate for Discharge 09/07/2020 1239 by Hubert Azure, RN Outcome: Progressing Goal: Will remain free from infection 09/07/2020 1411 by Hubert Azure, RN Outcome: Adequate for Discharge 09/07/2020 1239 by Hubert Azure, RN Outcome: Progressing Goal: Diagnostic test results will improve Outcome: Adequate for Discharge Goal: Respiratory complications will improve Outcome: Adequate for Discharge Goal: Cardiovascular complication will be avoided Outcome: Adequate for Discharge   Problem: Activity: Goal: Risk for activity intolerance will decrease Outcome: Adequate for Discharge   Problem: Coping: Goal: Level of anxiety will decrease Outcome: Adequate for Discharge   Problem: Elimination: Goal: Will not experience complications related to bowel motility Outcome: Adequate for Discharge Goal: Will not experience complications related to urinary retention Outcome: Adequate for Discharge   Problem: Pain Managment: Goal: General experience of comfort will improve Outcome: Adequate for Discharge   Problem: Safety: Goal: Ability to remain free from injury will improve Outcome: Adequate for Discharge

## 2020-09-14 ENCOUNTER — Encounter: Payer: Self-pay | Admitting: *Deleted

## 2020-09-19 ENCOUNTER — Ambulatory Visit
Admission: RE | Admit: 2020-09-19 | Discharge: 2020-09-19 | Disposition: A | Discharge status: Home / Self Care | Payer: PRIVATE HEALTH INSURANCE | Source: Ambulatory Visit | Attending: Radiology | Admitting: Radiology

## 2020-09-19 ENCOUNTER — Other Ambulatory Visit: Payer: Self-pay

## 2020-09-19 DIAGNOSIS — D259 Leiomyoma of uterus, unspecified: Secondary | ICD-10-CM

## 2020-09-19 HISTORY — PX: IR RADIOLOGIST EVAL & MGMT: IMG5224

## 2020-09-19 NOTE — Progress Notes (Signed)
Patient ID: Krista Fisher, female   DOB: [birth year], 44 y.o.   MRN: [id number]       Chief Complaint: Patient was consulted remotely today ([place]) for scheduled follow-up uterine fibroid embolization at the request of Allred,Darrell K.    Referring Physician(s): Banga,Cecilia Worema    History of Present Illness: Krista Fisher is a 44 y.o. female  who was diagnosed with 10 to 12 years ago when she underwent laparoscopic removal for pelvic pain and pressure. Over the past 3 to 4 months she had had recurrence of the same sort of pelvic pain and pressure. She was not having significant menorrhagia. She was on Nexplanon. She is G1, P1 with no plans for future pregnancy. 07/19/2020 MRI demonstrated multiple uterine fibroids up to 8.1 cm, no pedunculated lesions on a narrow stalk. 09/06/2020 she underwent uncomplicated transradial bilateral uterine fibroid embolization.  She did well overnight and felt like the available medications adequately controlled her postembolization syndrome.  She was discharged the following morning.  She has done well at home.  She had a little rash at her left wrist near the radial entry site, which resolved.  Her post procedure symptoms resolved over the following 4 to 5 days.  Currently she feels like she is basically asymptomatic from the procedure.  She is back to full activity.   Past Medical History:  Diagnosis Date  . GERD (gastroesophageal reflux disease)   . Hypertension     Past Surgical History:  Procedure Laterality Date  . CESAREAN SECTION    . CHOLECYSTECTOMY    . GASTRECTOMY    . GASTRIC BYPASS    . IR ANGIOGRAM PELVIS SELECTIVE OR SUPRASELECTIVE  09/06/2020  . IR ANGIOGRAM SELECTIVE EACH ADDITIONAL VESSEL  09/06/2020  . IR ANGIOGRAM SELECTIVE EACH ADDITIONAL VESSEL  09/06/2020  . IR EMBO TUMOR ORGAN ISCHEMIA INFARCT INC GUIDE ROADMAPPING  09/06/2020  . IR RADIOLOGIST EVAL & MGMT  07/11/2020  . IR US GUIDE VASC ACCESS LEFT  09/06/2020  .  KNEE SURGERY    . WRIST FRACTURE SURGERY      Allergies: Latex and Wellbutrin xl [bupropion]  Medications: Prior to Admission medications   Medication Sig Start Date End Date Taking? Authorizing Provider  ALPRAZolam Duanne Moron) 0.5 MG tablet Take 0.5 mg by mouth at bedtime as needed for anxiety.     [provider]  cetirizine (ZYRTEC) 10 MG tablet Take 10 mg by mouth daily.    [provider]  colestipol (COLESTID) 1 g tablet Take 2 tablets (2 g total) by mouth 2 (two) times daily. 08/07/20 09/06/20  Milus Banister, MD  COLLAGEN PO Take 1 tablet by mouth daily.     [provider]  docusate sodium (COLACE) 100 MG capsule Take 1 capsule (100 mg total) by mouth 2 (two) times daily. 09/07/20   Allred, Darrell K, PA-C  escitalopram (LEXAPRO) 20 MG tablet Take 20 mg by mouth daily.    [provider]  HYDROcodone-acetaminophen (NORCO) 5-325 MG tablet Take 1-2 tablets by mouth every 6 (six) hours as needed for moderate pain. 09/07/20   Allred, Shirlyn Goltz, PA-C  HYDROcodone-acetaminophen (NORCO/VICODIN) 5-325 MG tablet Take 1-2 tablets by mouth every 4 (four) hours as needed for moderate pain. 09/07/20   Allred, Darrell K, PA-C  ibuprofen (ADVIL) 800 MG tablet Take 1 tablet (800 mg total) by mouth every 6 (six) hours. Adhere to this dose or may do 600 mg every 6 hours for next 5 days, then convert to  normal dosing prn 09/07/20   Allred, Darrell K, PA-C  Multiple Vitamin (MULTI-VITAMIN) tablet Take 1 tablet by mouth daily.  06/19/11   [provider]  Multiple Vitamins-Iron (MULTIVITAMINS WITH IRON) TABS tablet Take 1 tablet by mouth daily.    [provider]  NASCOBAL 500 MCG/0.1ML SOLN Place 500 mcg into the nose once a week. On Sundays 07/18/20   [provider]  omeprazole (PRILOSEC) 40 MG capsule Take 1 capsule (40 mg total) by mouth daily. 06/26/20   Cheyenne Adas, NP  promethazine (PHENERGAN) 25 MG tablet Take 1 tablet (25 mg total) by mouth  every 8 (eight) hours as needed for nausea. 09/07/20   Allred, Darrell K, PA-C  Vitamin D, Ergocalciferol, (DRISDOL) 1.25 MG (50000 UNIT) CAPS capsule Take 50,000 Units by mouth every 7 (seven) days.    [provider]     Family History  Problem Relation Age of Onset  . Heart failure Father   . Diabetes Father   . Colon cancer Neg Hx   . Stomach cancer Neg Hx   . Esophageal cancer Neg Hx   . Pancreatic cancer Neg Hx     Social History   Socioeconomic History  . Marital status: Single    Spouse name: Not on file  . Number of children: Not on file  . Years of education: Not on file  . Highest education level: Not on file  Occupational History  . Not on file  Tobacco Use  . Smoking status: Never Smoker  . Smokeless tobacco: Never Used  Vaping Use  . Vaping Use: Never used  Substance and Sexual Activity  . Alcohol use: No  . Drug use: No  . Sexual activity: Not on file  Other Topics Concern  . Not on file  Social History Narrative  . Not on file   Social Determinants of Health   Financial Resource Strain:   . Difficulty of Paying Living Expenses: Not on file  Food Insecurity:   . Worried About Charity fundraiser in the Last Year: Not on file  . Ran Out of Food in the Last Year: Not on file  Transportation Needs:   . Lack of Transportation (Medical): Not on file  . Lack of Transportation (Non-Medical): Not on file  Physical Activity:   . Days of Exercise per Week: Not on file  . Minutes of Exercise per Session: Not on file  Stress:   . Feeling of Stress : Not on file  Social Connections:   . Frequency of Communication with Friends and Family: Not on file  . Frequency of Social Gatherings with Friends and Family: Not on file  . Attends Religious Services: Not on file  . Active Member of Clubs or Organizations: Not on file  . Attends Archivist Meetings: Not on file  . Marital Status: Not on file    ECOG Status: 1 - Symptomatic but  completely ambulatory  Review of Systems  Review of Systems: A 12 point ROS discussed and pertinent positives are indicated in the HPI above.  All other systems are negative.  Physical Exam No direct physical exam was performed (except for noted visual exam findings with Video Visits).    Vital Signs: LMP 09/06/2020 Comment: spotting today  Imaging: IR Angiogram Pelvis Selective Or Supraselective  Result Date: 09/06/2020 CLINICAL DATA:  Symptomatic uterine fibroids. See previous consultation FLUOROSCOPY TIME:  13 minutes 12 seconds; 524 mGy EXAM: EXAM BILATERAL UTERINE ARTERY EMBOLIZATION TECHNIQUE: The procedure,  risks, benefits, and alternatives were explained to the patient. Questions regarding the procedure were encouraged and answered. The patient understands and consents to the procedure. As antibiotic prophylaxis, cefazolin 3 g was ordered pre-procedure and administered intravenously within one hour of incision. Preprocedure evaluation of the left radial artery was performed. Ultrasound survey of the left wrist was performed with images stored and sent to PACs. Diameter radial artery measured 3 mm. Skin site was marked, prepped with Betadine, and draped in usual sterile fashion, and infiltrated locally with 1% lidocaine. sedation A micropuncture needle was used access the left radial artery under ultrasound. With excellent arterial blood flow returned, and an .018 micro wire was passed through the needle into the radial artery. The needle was removed, and a 7F Glidesheath Slender was placed over the wire. The inner dilator and wire were removed, and the sheath was flushed. Radial cocktail was then infused slowly, after diluting the volume with ~15cc of blood. A benson wire was then used to navigate the selected 7F catheter into the descending, used to selectively catheterize the right internal iliac artery for pelvic arteriography. A coaxial microcatheter was advanced with a guidewire and used  to selectively catheterize the right uterine artery. The microcatheter tip was positioned in the distal horizontal segment. Selective arteriogram confirms appropriate positioning. Distal branches of the right uterine artery were embolized with 500-700 micron Embospheres. Embolization continued until near stasis of flow was achieved. Microcatheter was withdrawn and a followup selective right internal iliac arteriogram was obtained. The angiographic catheter was advanced into the left internal iliac artery . Again the microcatheter with guidewire was coaxially advanced and used to selectively catheterize the left uterine artery. Confirmatory arteriogram was performed. Left uterine artery branches were embolized with 500-700 micron Embospheres to near stasis of flow. A total of 5 vials of Embospheres were utilized for the case. Microcatheter was withdrawn and a followup arteriogram of the left internal iliac artery was performed. Angiographic catheter removed. After infusion of nitroglycerin and verapamil, the radial sheath removed and hemostasis was achieved with radial band per protocol. Patient tolerated the procedure well. COMPLICATIONS: COMPLICATIONS none IMPRESSION: 1. Technically successful bilateral uterine artery embolization using 500-700 micron Embospheres. Electronically Signed   By: Lucrezia Europe M.D.   On: 09/06/2020 15:27   IR Angiogram Selective Each Additional Vessel  Result Date: 09/06/2020 CLINICAL DATA:  Symptomatic uterine fibroids. See previous consultation FLUOROSCOPY TIME:  13 minutes 12 seconds; 524 mGy EXAM: EXAM BILATERAL UTERINE ARTERY EMBOLIZATION TECHNIQUE: The procedure, risks, benefits, and alternatives were explained to the patient. Questions regarding the procedure were encouraged and answered. The patient understands and consents to the procedure. As antibiotic prophylaxis, cefazolin 3 g was ordered pre-procedure and administered intravenously within one hour of incision.  Preprocedure evaluation of the left radial artery was performed. Ultrasound survey of the left wrist was performed with images stored and sent to PACs. Diameter radial artery measured 3 mm. Skin site was marked, prepped with Betadine, and draped in usual sterile fashion, and infiltrated locally with 1% lidocaine. sedation A micropuncture needle was used access the left radial artery under ultrasound. With excellent arterial blood flow returned, and an .018 micro wire was passed through the needle into the radial artery. The needle was removed, and a 7F Glidesheath Slender was placed over the wire. The inner dilator and wire were removed, and the sheath was flushed. Radial cocktail was then infused slowly, after diluting the volume with ~15cc of blood. A benson wire was then used  to navigate the selected 88F catheter into the descending, used to selectively catheterize the right internal iliac artery for pelvic arteriography. A coaxial microcatheter was advanced with a guidewire and used to selectively catheterize the right uterine artery. The microcatheter tip was positioned in the distal horizontal segment. Selective arteriogram confirms appropriate positioning. Distal branches of the right uterine artery were embolized with 500-700 micron Embospheres. Embolization continued until near stasis of flow was achieved. Microcatheter was withdrawn and a followup selective right internal iliac arteriogram was obtained. The angiographic catheter was advanced into the left internal iliac artery . Again the microcatheter with guidewire was coaxially advanced and used to selectively catheterize the left uterine artery. Confirmatory arteriogram was performed. Left uterine artery branches were embolized with 500-700 micron Embospheres to near stasis of flow. A total of 5 vials of Embospheres were utilized for the case. Microcatheter was withdrawn and a followup arteriogram of the left internal iliac artery was performed.  Angiographic catheter removed. After infusion of nitroglycerin and verapamil, the radial sheath removed and hemostasis was achieved with radial band per protocol. Patient tolerated the procedure well. COMPLICATIONS: COMPLICATIONS none IMPRESSION: 1. Technically successful bilateral uterine artery embolization using 500-700 micron Embospheres. Electronically Signed   By: Lucrezia Europe M.D.   On: 09/06/2020 15:27   IR Angiogram Selective Each Additional Vessel  Result Date: 09/06/2020 CLINICAL DATA:  Symptomatic uterine fibroids. See previous consultation FLUOROSCOPY TIME:  13 minutes 12 seconds; 524 mGy EXAM: EXAM BILATERAL UTERINE ARTERY EMBOLIZATION TECHNIQUE: The procedure, risks, benefits, and alternatives were explained to the patient. Questions regarding the procedure were encouraged and answered. The patient understands and consents to the procedure. As antibiotic prophylaxis, cefazolin 3 g was ordered pre-procedure and administered intravenously within one hour of incision. Preprocedure evaluation of the left radial artery was performed. Ultrasound survey of the left wrist was performed with images stored and sent to PACs. Diameter radial artery measured 3 mm. Skin site was marked, prepped with Betadine, and draped in usual sterile fashion, and infiltrated locally with 1% lidocaine. sedation A micropuncture needle was used access the left radial artery under ultrasound. With excellent arterial blood flow returned, and an .018 micro wire was passed through the needle into the radial artery. The needle was removed, and a 88F Glidesheath Slender was placed over the wire. The inner dilator and wire were removed, and the sheath was flushed. Radial cocktail was then infused slowly, after diluting the volume with ~15cc of blood. A benson wire was then used to navigate the selected 88F catheter into the descending, used to selectively catheterize the right internal iliac artery for pelvic arteriography. A coaxial  microcatheter was advanced with a guidewire and used to selectively catheterize the right uterine artery. The microcatheter tip was positioned in the distal horizontal segment. Selective arteriogram confirms appropriate positioning. Distal branches of the right uterine artery were embolized with 500-700 micron Embospheres. Embolization continued until near stasis of flow was achieved. Microcatheter was withdrawn and a followup selective right internal iliac arteriogram was obtained. The angiographic catheter was advanced into the left internal iliac artery . Again the microcatheter with guidewire was coaxially advanced and used to selectively catheterize the left uterine artery. Confirmatory arteriogram was performed. Left uterine artery branches were embolized with 500-700 micron Embospheres to near stasis of flow. A total of 5 vials of Embospheres were utilized for the case. Microcatheter was withdrawn and a followup arteriogram of the left internal iliac artery was performed. Angiographic catheter removed. After infusion of nitroglycerin  and verapamil, the radial sheath removed and hemostasis was achieved with radial band per protocol. Patient tolerated the procedure well. COMPLICATIONS: COMPLICATIONS none IMPRESSION: 1. Technically successful bilateral uterine artery embolization using 500-700 micron Embospheres. Electronically Signed   By: Lucrezia Europe M.D.   On: 09/06/2020 15:27   IR US Guide Vasc Access Left  Result Date: 09/06/2020 CLINICAL DATA:  Symptomatic uterine fibroids. See previous consultation FLUOROSCOPY TIME:  13 minutes 12 seconds; 524 mGy EXAM: EXAM BILATERAL UTERINE ARTERY EMBOLIZATION TECHNIQUE: The procedure, risks, benefits, and alternatives were explained to the patient. Questions regarding the procedure were encouraged and answered. The patient understands and consents to the procedure. As antibiotic prophylaxis, cefazolin 3 g was ordered pre-procedure and administered intravenously  within one hour of incision. Preprocedure evaluation of the left radial artery was performed. Ultrasound survey of the left wrist was performed with images stored and sent to PACs. Diameter radial artery measured 3 mm. Skin site was marked, prepped with Betadine, and draped in usual sterile fashion, and infiltrated locally with 1% lidocaine. sedation A micropuncture needle was used access the left radial artery under ultrasound. With excellent arterial blood flow returned, and an .018 micro wire was passed through the needle into the radial artery. The needle was removed, and a 599F Glidesheath Slender was placed over the wire. The inner dilator and wire were removed, and the sheath was flushed. Radial cocktail was then infused slowly, after diluting the volume with ~15cc of blood. A benson wire was then used to navigate the selected 599F catheter into the descending, used to selectively catheterize the right internal iliac artery for pelvic arteriography. A coaxial microcatheter was advanced with a guidewire and used to selectively catheterize the right uterine artery. The microcatheter tip was positioned in the distal horizontal segment. Selective arteriogram confirms appropriate positioning. Distal branches of the right uterine artery were embolized with 500-700 micron Embospheres. Embolization continued until near stasis of flow was achieved. Microcatheter was withdrawn and a followup selective right internal iliac arteriogram was obtained. The angiographic catheter was advanced into the left internal iliac artery . Again the microcatheter with guidewire was coaxially advanced and used to selectively catheterize the left uterine artery. Confirmatory arteriogram was performed. Left uterine artery branches were embolized with 500-700 micron Embospheres to near stasis of flow. A total of 5 vials of Embospheres were utilized for the case. Microcatheter was withdrawn and a followup arteriogram of the left internal iliac  artery was performed. Angiographic catheter removed. After infusion of nitroglycerin and verapamil, the radial sheath removed and hemostasis was achieved with radial band per protocol. Patient tolerated the procedure well. COMPLICATIONS: COMPLICATIONS none IMPRESSION: 1. Technically successful bilateral uterine artery embolization using 500-700 micron Embospheres. Electronically Signed   By: Lucrezia Europe M.D.   On: 09/06/2020 15:27   IR EMBO TUMOR ORGAN ISCHEMIA INFARCT INC GUIDE ROADMAPPING  Result Date: 09/06/2020 CLINICAL DATA:  Symptomatic uterine fibroids. See previous consultation FLUOROSCOPY TIME:  13 minutes 12 seconds; 524 mGy EXAM: EXAM BILATERAL UTERINE ARTERY EMBOLIZATION TECHNIQUE: The procedure, risks, benefits, and alternatives were explained to the patient. Questions regarding the procedure were encouraged and answered. The patient understands and consents to the procedure. As antibiotic prophylaxis, cefazolin 3 g was ordered pre-procedure and administered intravenously within one hour of incision. Preprocedure evaluation of the left radial artery was performed. Ultrasound survey of the left wrist was performed with images stored and sent to PACs. Diameter radial artery measured 3 mm. Skin site was marked, prepped with Betadine,  and draped in usual sterile fashion, and infiltrated locally with 1% lidocaine. sedation A micropuncture needle was used access the left radial artery under ultrasound. With excellent arterial blood flow returned, and an .018 micro wire was passed through the needle into the radial artery. The needle was removed, and a 70F Glidesheath Slender was placed over the wire. The inner dilator and wire were removed, and the sheath was flushed. Radial cocktail was then infused slowly, after diluting the volume with ~15cc of blood. A benson wire was then used to navigate the selected 70F catheter into the descending, used to selectively catheterize the right internal iliac artery for  pelvic arteriography. A coaxial microcatheter was advanced with a guidewire and used to selectively catheterize the right uterine artery. The microcatheter tip was positioned in the distal horizontal segment. Selective arteriogram confirms appropriate positioning. Distal branches of the right uterine artery were embolized with 500-700 micron Embospheres. Embolization continued until near stasis of flow was achieved. Microcatheter was withdrawn and a followup selective right internal iliac arteriogram was obtained. The angiographic catheter was advanced into the left internal iliac artery . Again the microcatheter with guidewire was coaxially advanced and used to selectively catheterize the left uterine artery. Confirmatory arteriogram was performed. Left uterine artery branches were embolized with 500-700 micron Embospheres to near stasis of flow. A total of 5 vials of Embospheres were utilized for the case. Microcatheter was withdrawn and a followup arteriogram of the left internal iliac artery was performed. Angiographic catheter removed. After infusion of nitroglycerin and verapamil, the radial sheath removed and hemostasis was achieved with radial band per protocol. Patient tolerated the procedure well. COMPLICATIONS: COMPLICATIONS none IMPRESSION: 1. Technically successful bilateral uterine artery embolization using 500-700 micron Embospheres. Electronically Signed   By: Lucrezia Europe M.D.   On: 09/06/2020 15:27    Labs:  CBC: Recent Labs    09/06/20 1242  WBC 7.7  HGB 14.5  HCT 41.3  PLT 267    COAGS: Recent Labs    09/06/20 1242  INR 1.0    BMP: Recent Labs    09/06/20 1242  NA 140  K 3.8  CL 108  CO2 22  GLUCOSE 93  BUN 10  CALCIUM 9.3  CREATININE 0.92  GFRNONAA >60  GFRAA >60    LIVER FUNCTION TESTS: No results for input(s): BILITOT, AST, ALT, ALKPHOS, PROT, ALBUMIN in the last 8760 hours.  TUMOR MARKERS: No results for input(s): AFPTM, CEA, CA199, CHROMGRNA in the  last 8760 hours.  Assessment and Plan:  My impression is that the patient has done very well in the immediate period post bilateral uterine fibroid embolization.  Her post embolization syndrome was well controlled and has largely resolved.  We discussed the anticipated 3-61-month time period for complete involution of the embolized uterine fibroids.  We discussed anticipation of improvement in any abnormal uterine bleeding and pain over that time.  I have a size that she needs to contact me right away if she notices any passage of tissue fragments which might suggest uterine fibroid fragmentation, a risk factor for endometritis which might require D&C.  Otherwise, I will plan to follow-up with her at the 79-month mark to make sure her symptoms have resolved to her satisfaction, with anticipation of long-term durable relief of symptoms.  We emphasized again the importance of continued routine gynecologic follow-up.  She knows to call in the interval if any questions or problems.  Thank you for this interesting consult.  I greatly enjoyed meeting  ADRIEN DIETZMAN and look forward to participating in their care.  A copy of this report was sent to the requesting provider on this date.  Electronically Signed: Rickard Rhymes 09/19/2020, 3:44 PM   I spent a total of    15 Minutes in remote  clinical consultation, greater than 50% of which was counseling/coordinating care for uterine fibroids,  post uterine artery embolization.    Visit type: Audio only (telephone). Audio (no video) only due to patient's lack of internet/smartphone capability. Alternative for in-person consultation at [facility], [address] [place], [place], [place]. This visit type was conducted due to national recommendations for restrictions regarding the COVID-19 Pandemic (e.g. social distancing).  This format is felt to be most appropriate for this patient at this time.  All issues noted in this document were discussed and  addressed.

## 2020-09-20 ENCOUNTER — Encounter: Payer: Self-pay | Admitting: *Deleted

## 2021-01-15 ENCOUNTER — Ambulatory Visit: Payer: Self-pay | Admitting: Family Medicine

## 2021-01-22 ENCOUNTER — Encounter: Payer: Self-pay | Admitting: Family Medicine

## 2021-01-22 ENCOUNTER — Ambulatory Visit: Payer: PRIVATE HEALTH INSURANCE | Admitting: Family Medicine

## 2021-01-22 VITALS — BP 114/78 | HR 80

## 2021-01-22 DIAGNOSIS — J019 Acute sinusitis, unspecified: Secondary | ICD-10-CM

## 2021-01-22 MED ORDER — AMOXICILLIN-POT CLAVULANATE 875-125 MG PO TABS: 1.0000 | ORAL_TABLET | Freq: Two times a day (BID) | ORAL | 0 refills | Status: DC

## 2021-01-22 NOTE — Progress Notes (Signed)
Subjective:     Patient ID: Krista Fisher, female   DOB: [birth year], 45 y.o.   MRN: [id number]  HPI  Fate presents to the [facility] clinic for evaluation of a cough x 1 month. She reports last two weeks cough has worsened, become more productive, with ear fullness and congestion. She denies fever, shortness of breath, or chest pain. She denies any GI symptoms. She reports having multiple negative covid-19 tests done at work. She has tried otc robitussin DM and mucinex DM, which have helped some.   Past Medical History:  Diagnosis Date  . GERD (gastroesophageal reflux disease)   . Hypertension    Allergies  Allergen Reactions  . Latex   . Wellbutrin Xl [Bupropion]     Abdominal pain    Current Outpatient Medications:  .  ALPRAZolam (XANAX) 0.5 MG tablet, Take 0.5 mg by mouth at bedtime as needed for anxiety. , Disp: , Rfl:  .  amoxicillin-clavulanate (AUGMENTIN) 875-125 MG tablet, Take 1 tablet by mouth 2 (two) times daily., Disp: 20 tablet, Rfl: 0 .  COLLAGEN PO, Take 1 tablet by mouth daily. , Disp: , Rfl:  .  escitalopram (LEXAPRO) 20 MG tablet, Take 20 mg by mouth daily., Disp: , Rfl:  .  ibuprofen (ADVIL) 800 MG tablet, Take 1 tablet (800 mg total) by mouth every 6 (six) hours. Adhere to this dose or may do 600 mg every 6 hours for next 5 days, then convert to normal dosing prn, Disp: 30 tablet, Rfl: 0 .  loratadine (CLARITIN) 10 MG tablet, Take 10 mg by mouth daily., Disp: , Rfl:  .  omeprazole (PRILOSEC) 40 MG capsule, Take 1 capsule (40 mg total) by mouth daily., Disp: 30 capsule, Rfl: 2 .  Vitamin D, Ergocalciferol, (DRISDOL) 1.25 MG (50000 UNIT) CAPS capsule, Take 50,000 Units by mouth every 7 (seven) days., Disp: , Rfl:  .  fluticasone (FLONASE) 50 MCG/ACT nasal spray, 1 spray by Both Nostrils route daily., Disp: , Rfl:  .  Multiple Vitamins-Iron (MULTIVITAMINS WITH IRON) TABS tablet, Take 1 tablet by mouth daily., Disp: , Rfl:    Review of Systems   Constitutional: Negative for activity change, appetite change, chills, fever and unexpected weight change.  HENT: Positive for congestion, ear pain, postnasal drip and sinus pressure. Negative for facial swelling, sore throat and trouble swallowing.   Eyes: Negative for pain, discharge, redness, itching and visual disturbance.  Respiratory: Positive for cough. Negative for shortness of breath and wheezing.   Cardiovascular: Negative for chest pain, palpitations and leg swelling.  Gastrointestinal: Negative for abdominal pain, constipation, diarrhea, nausea and vomiting.  Musculoskeletal: Negative for neck pain.  Skin: Negative for color change.  Neurological: Negative for dizziness and headaches.       Objective:   Physical Exam Vitals reviewed.  Constitutional:      General: She is not in acute distress.    Appearance: Normal appearance. She is well-developed.  HENT:     Head: Normocephalic and atraumatic.     Ears:     Comments: Bilateral TM partially obscured by cerumen. Mild erythema noted with fluid.     Nose:     Right Sinus: Frontal sinus tenderness present. No maxillary sinus tenderness.     Left Sinus: Frontal sinus tenderness present. No maxillary sinus tenderness.  Eyes:     General:        Right eye: No discharge.        Left eye: No discharge.  Cardiovascular:  Rate and Rhythm: Normal rate and regular rhythm.     Heart sounds: Normal heart sounds.  Pulmonary:     Effort: Pulmonary effort is normal. No respiratory distress.     Breath sounds: Normal breath sounds. No wheezing or rales.  Musculoskeletal:     Cervical back: Neck supple. No rigidity.  Lymphadenopathy:     Cervical: No cervical adenopathy.  Skin:    General: Skin is warm and dry.  Neurological:     Mental Status: She is alert and oriented to person, place, and time.  Psychiatric:        Mood and Affect: Mood normal.        Behavior: Behavior normal.    Today's Vitals   01/22/21 1634  BP:  114/78  Pulse: 80  SpO2: 93%   There is no height or weight on file to calculate BMI.      Assessment:     Acute rhinosinusitis      Plan:     1. Given duration of symptoms and negative covid-19 testing will treat with abx. Will monitor for treatment failure d/t malabsorption from bariatric surgery (duodenal switch). Continue symptom management with mucinex prn since this has been effective for pt. If symptoms worsen, develop fever, shortness of breath or other concerning symptoms she should be seen in urgent care or ED. F/u if no improvement over the next week.

## 2021-02-26 ENCOUNTER — Other Ambulatory Visit: Payer: Self-pay | Admitting: Interventional Radiology

## 2021-02-26 DIAGNOSIS — D250 Submucous leiomyoma of uterus: Secondary | ICD-10-CM

## 2021-04-08 DIAGNOSIS — H52221 Regular astigmatism, right eye: Secondary | ICD-10-CM | POA: Diagnosis not present

## 2021-04-08 DIAGNOSIS — H5203 Hypermetropia, bilateral: Secondary | ICD-10-CM | POA: Diagnosis not present

## 2021-04-08 DIAGNOSIS — H524 Presbyopia: Secondary | ICD-10-CM | POA: Diagnosis not present

## 2021-05-03 ENCOUNTER — Ambulatory Visit (HOSPITAL_COMMUNITY)
Admission: RE | Admit: 2021-05-03 | Discharge: 2021-05-03 | Disposition: A | Discharge status: Home / Self Care | Payer: 59 | Source: Ambulatory Visit | Attending: Interventional Radiology | Admitting: Interventional Radiology

## 2021-05-03 ENCOUNTER — Other Ambulatory Visit: Payer: Self-pay

## 2021-05-03 DIAGNOSIS — D259 Leiomyoma of uterus, unspecified: Secondary | ICD-10-CM | POA: Diagnosis not present

## 2021-05-03 DIAGNOSIS — D250 Submucous leiomyoma of uterus: Secondary | ICD-10-CM | POA: Diagnosis not present

## 2021-05-03 IMAGING — MR MR PELVIS WO/W CM
20 series · 48 of 48 positions shown · IV contrast (10 ML GADAVIST)
Comparison: [DATE]

CLINICAL DATA: Follow-up uterine fibroids. Approximately 8 months
status post uterine artery embolization.

EXAM:
MRI PELVIS WITHOUT AND WITH CONTRAST
TECHNIQUE: Multiplanar multisequence MR imaging of the pelvis was performed
both before and after administration of intravenous contrast.
CONTRAST:  10mL GADAVIST GADOBUTROL 1 MMOL/ML IV SOLN

[Series 2: T2 · coronal · 6.0mm · 1.56mm/px · 2 of 30 slices shown (1 of 4)]
[im 1/30]
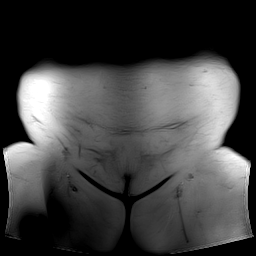
[im 30/30]
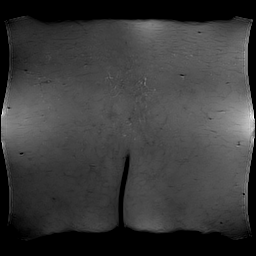

[Series 3: T2 · axial · 5.0mm · 0.51mm/px · z∈[-59,+151]mm · 2 of 36 slices shown (2 of 4)]
[im 1/36]
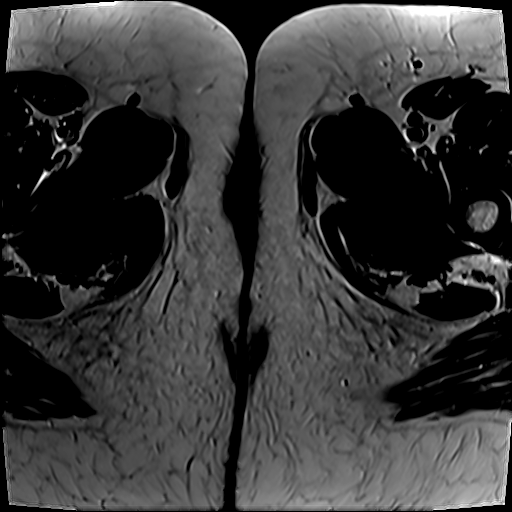
[im 36/36]
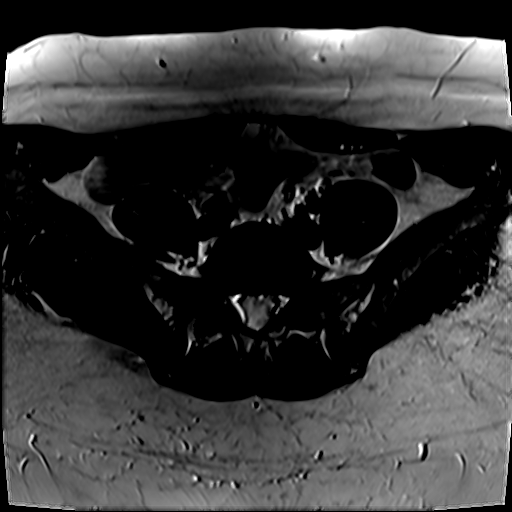

[Series 4: T2 · coronal · 4.0mm · 0.51mm/px · 1 of 41 slices shown (3 of 4)]
[im 1/41]
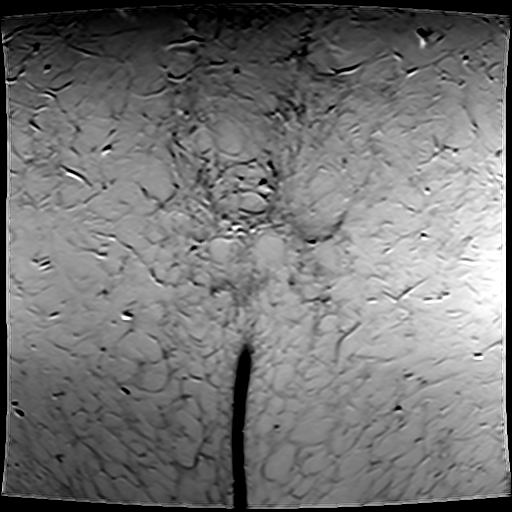

[Series 5: T2 fat-sat · axial · 5.0mm · 0.51mm/px · 1 of 36 slices shown]
[im 1/36]
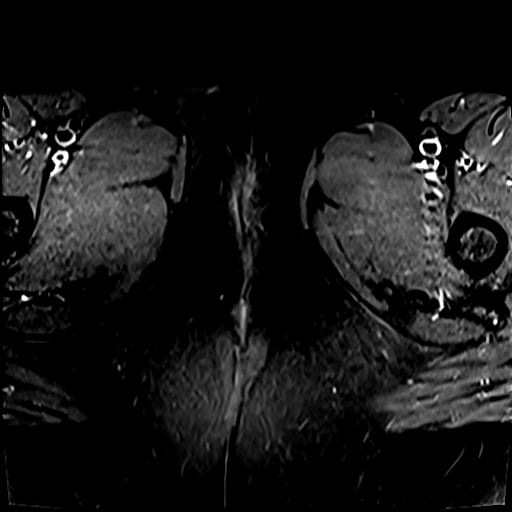

[Series 6: T2 · sagittal · 5.0mm · 0.55mm/px · 1 of 40 slices shown (4 of 4)]
[im 1/40]
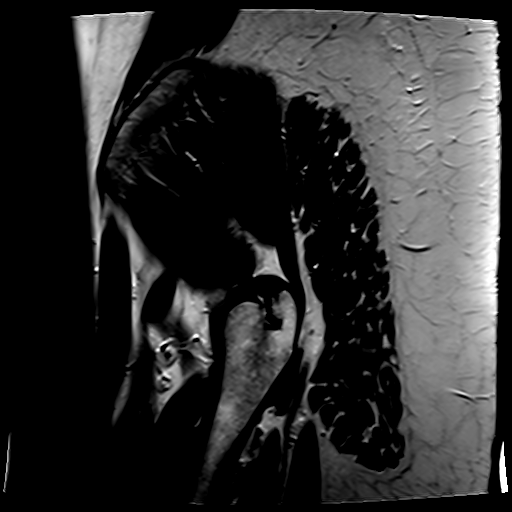

[Series 7: T1 · axial · 4.0mm · 0.84mm/px · z∈[-65,+187]mm · 2 of 64 slices shown (1 of 2)]
[im 1/64]
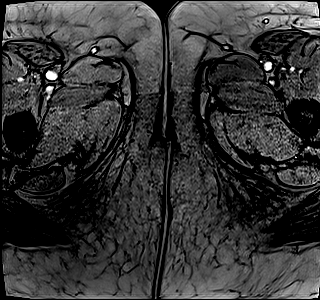
[im 64/64]
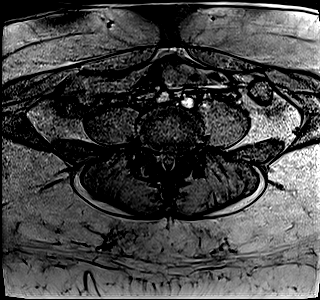

[Series 8: T1 · axial · 4.0mm · 0.84mm/px · z∈[-65,+187]mm · 2 of 64 slices shown (2 of 2)]
[im 1/64]
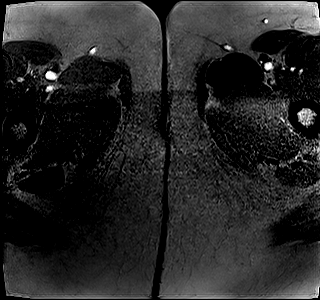
[im 64/64]
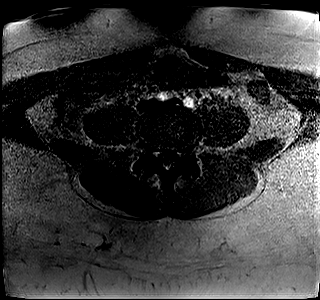

[Series 9: DWI · axial · 5.0mm · 2.80mm/px · z∈[-82,+163]mm · 5 of 149 slices shown (1 of 3)]
[im 1/149]
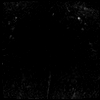
[im 38/149]
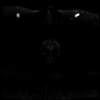
[im 75/149]
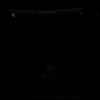
[im 112/149]
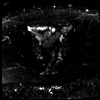
[im 149/149]
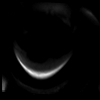

[Series 10: DWI · axial · 5.0mm · 2.80mm/px · z∈[-82,+163]mm · 2 of 50 slices shown (2 of 3)]
[im 1/50]
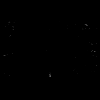
[im 50/50]
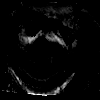

[Series 11: DWI · axial · 5.0mm · 2.80mm/px · z∈[-82,+163]mm · 2 of 50 slices shown (3 of 3)]
[im 1/50]
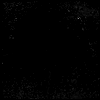
[im 50/50]
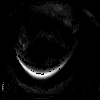

[Series 13: T1 dynamic · axial · 3.0mm · 0.84mm/px · z∈[-95,+142]mm · 3 of 80 slices shown (1 of 7)]
[im 1/80]
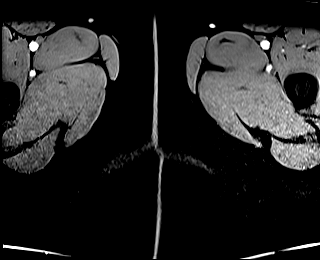
[im 40/80]
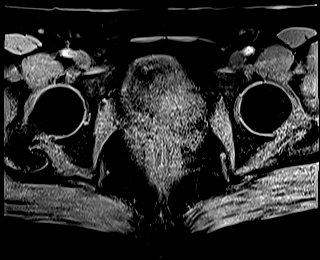
[im 80/80]
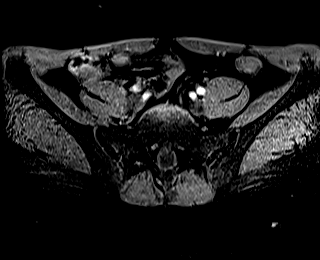

[Series 17: T1 dynamic · axial · 3.0mm · 0.84mm/px · z∈[-95,+142]mm · 3 of 80 slices shown (2 of 7)]
[im 1/80]
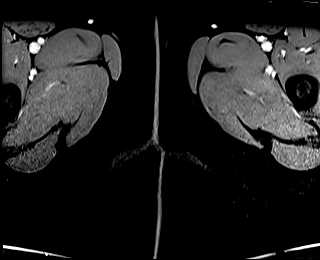
[im 40/80]
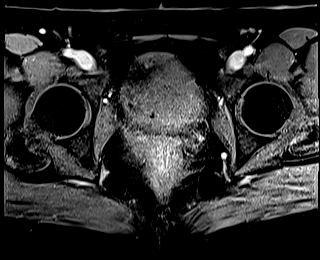
[im 80/80]
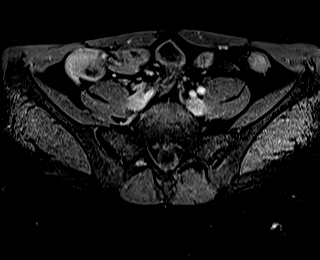

[Series 18: T1 dynamic · axial · 3.0mm · 0.84mm/px · z∈[-95,+142]mm · 3 of 80 slices shown (3 of 7)]
[im 1/80]
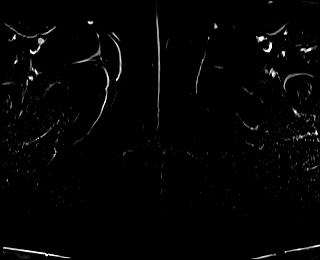
[im 40/80]
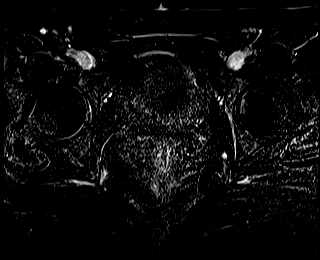
[im 80/80]
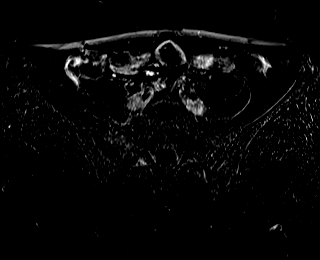

[Series 21: T1 dynamic · axial · 3.0mm · 0.84mm/px · z∈[-95,+142]mm · 3 of 80 slices shown (4 of 7)]
[im 1/80]
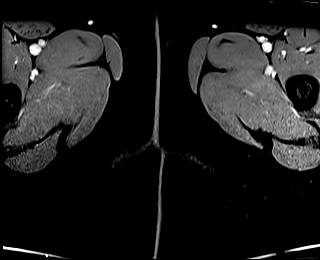
[im 40/80]
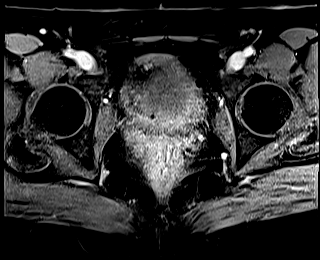
[im 80/80]
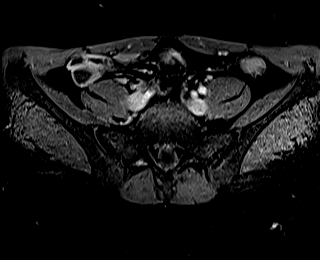

[Series 22: T1 dynamic · axial · 3.0mm · 0.84mm/px · z∈[-95,+142]mm · 3 of 80 slices shown (5 of 7)]
[im 1/80]
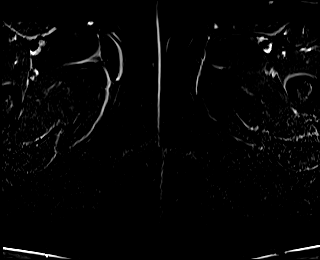
[im 40/80]
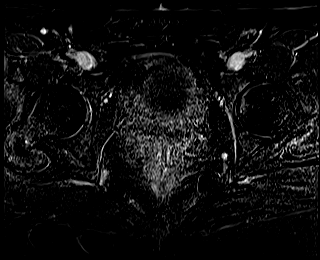
[im 80/80]
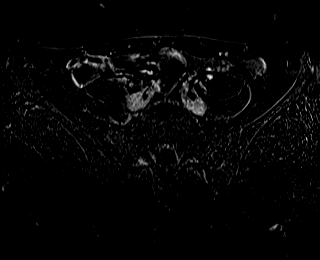

[Series 25: T1 dynamic · axial · 3.0mm · 0.84mm/px · z∈[-95,+142]mm · 3 of 80 slices shown (6 of 7)]
[im 1/80]
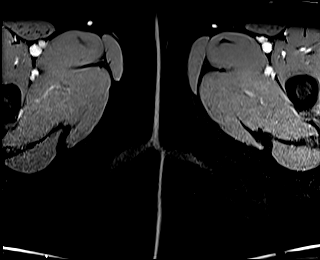
[im 40/80]
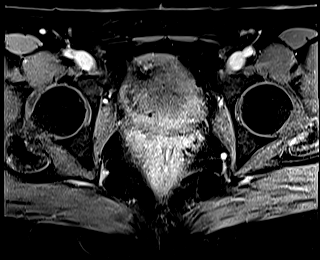
[im 80/80]
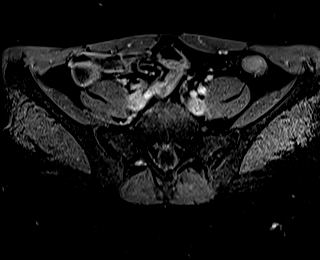

[Series 26: T1 dynamic · axial · 3.0mm · 0.84mm/px · z∈[-95,+142]mm · 3 of 80 slices shown (7 of 7)]
[im 1/80]
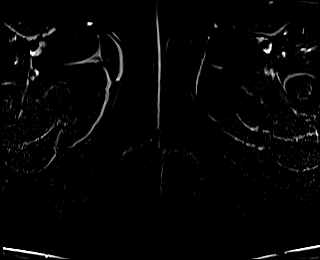
[im 40/80]
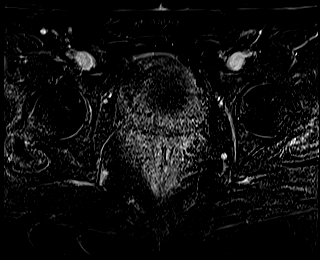
[im 80/80]
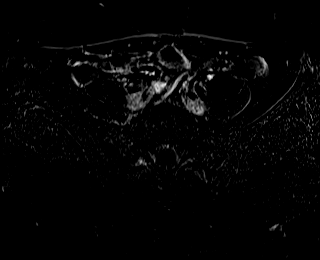

[Series 28: T1 dynamic post-contrast · axial · 3.0mm · 1.06mm/px · z∈[-91,+194]mm · 3 of 96 slices shown (1 of 2)]
[im 1/96]
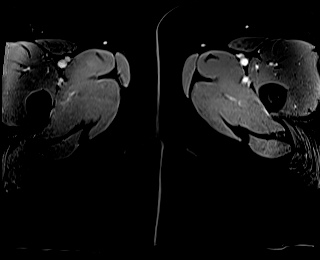
[im 48/96]
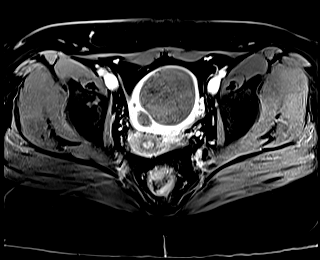
[im 96/96]
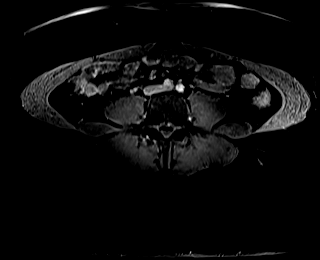

[Series 29: T2 post-contrast · sagittal · 5.0mm · 0.55mm/px · 1 of 40 slices shown]
[im 1/40]
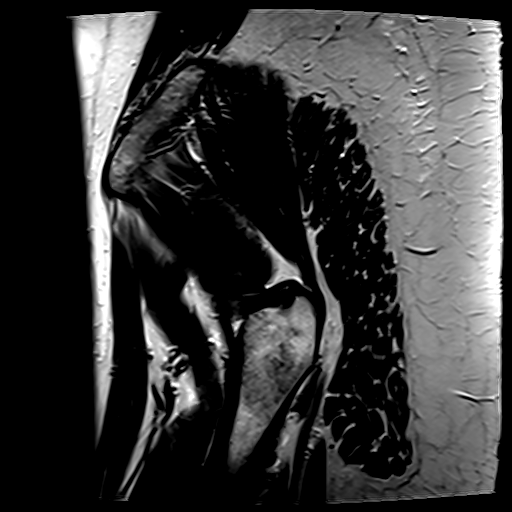

[Series 31: T1 dynamic post-contrast · sagittal · 3.0mm · 0.88mm/px · 3 of 80 slices shown (2 of 2)]
[im 1/80]
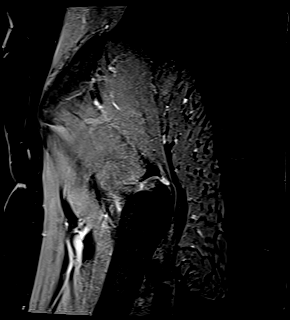
[im 40/80]
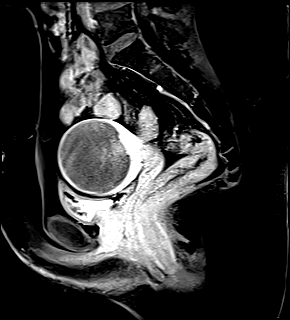
[im 80/80]
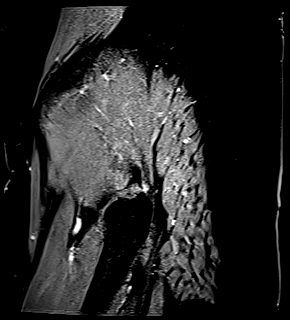

[48 of 48 positions shown; findings below may reference images not displayed]

FINDINGS: Lower Urinary Tract: No bladder or urethral abnormality identified.

Bowel:  Unremarkable visualized pelvic bowel loops.

Vascular/Lymphatic: No pathologically enlarged lymph nodes or other
significant abnormality.

Reproductive:

-- Uterus: Measures 9.6 x 6.7 x 6.9 cm (volume = 230 cm^3),
decreased from approximately 550 cm3 on prior study. Largest fibroid
in the upper uterine corpus and fundus currently measures 5.9 cm
compared to 8.1 cm previously. Smaller fibroid in the right anterior
lower uterine segment measures 2.2 cm.

-- Intracavitary fibroids:  None.

-- Pedunculated fibroids: None.

-- Fibroid contrast enhancement: These fibroids show no significant
residual contrast enhancement on subtraction imaging.

-- Right ovary:  Appears normal.  No mass identified.

-- Left ovary:  Appears normal.  No mass identified.

Other: No abnormal free fluid.

Musculoskeletal:  Unremarkable.
IMPRESSION: Significant decrease in overall uterine volume and size of
individual fibroids, as described above.

Normal appearance of both ovaries.

## 2021-05-03 MED ORDER — GADOBUTROL 1 MMOL/ML IV SOLN
10.0000 mL | Freq: Once | INTRAVENOUS | Status: AC | PRN
  Administered 2021-05-03: 10 mL via INTRAVENOUS

## 2021-05-06 ENCOUNTER — Other Ambulatory Visit (HOSPITAL_BASED_OUTPATIENT_CLINIC_OR_DEPARTMENT_OTHER): Payer: Self-pay

## 2021-05-06 MED ORDER — OMEPRAZOLE 40 MG PO CPDR
DELAYED_RELEASE_CAPSULE | ORAL | 3 refills | Status: DC
  Filled 2021-05-06: qty 30, 30d supply, fill #0

## 2021-05-06 MED ORDER — ESCITALOPRAM OXALATE 20 MG PO TABS
ORAL_TABLET | ORAL | 3 refills | Status: DC
  Filled 2021-05-06: qty 30, 30d supply, fill #0

## 2021-05-08 ENCOUNTER — Ambulatory Visit
Admission: RE | Admit: 2021-05-08 | Discharge: 2021-05-08 | Disposition: A | Discharge status: Home / Self Care | Payer: PRIVATE HEALTH INSURANCE | Source: Ambulatory Visit | Attending: Interventional Radiology | Admitting: Interventional Radiology

## 2021-05-08 ENCOUNTER — Encounter: Payer: Self-pay | Admitting: *Deleted

## 2021-05-08 ENCOUNTER — Other Ambulatory Visit: Payer: Self-pay

## 2021-05-08 DIAGNOSIS — D250 Submucous leiomyoma of uterus: Secondary | ICD-10-CM

## 2021-05-08 HISTORY — PX: IR RADIOLOGIST EVAL & MGMT: IMG5224

## 2021-05-08 NOTE — Progress Notes (Signed)
Patient ID: Krista Fisher, female   DOB: 12/18/1976, 45 y.o.   MRN: [id number]       Chief Complaint: Patient was consulted remotely today ([place]) for follow-up uterine fibroid embolization at the request of Malcolm Quast.    Referring Physician(s): Banga,Cecilia Worema (OB-GYN)    History of Present Illness: Krista Fisher is a 45 y.o. female   with a past medical history of hypertension, GERD, and recurrent uterine fibroids. She was first diagnosed with uterine fibroids 10-12 years ago. She underwent laparoscopic removal of uterine fibroids secondary to pelvic pain/pressure at that time. Over the past few months, patient developed pelvic pain/pressure similar to pain/pressure from prior uterine fibroids. 07/19/2020 MR pelvis  1. Fibroid uterus with dominant fibroid of the anterior uterine fundus with a large submucosal component distorting the endometrial cavity. Overall bulk of the uterus is approximately 10.3 cm and largest fibroid is approximately 8.1 cm. 2. There are additional much smaller intramural fibroids of the posterior fundus and body; the presence of significant submucosal components of these fibroids is more difficult to distinguish. 09/06/2020 patient underwent uncomplicated transradial bilateral uterine artery embolization.  Patient was discharged home the following morning without complication. She did well and her postembolization syndrome was well controlled.  She is back to full activity.  She feels like her fibroid symptoms have essentially resolved.  She is not having periods since November probably due to [place].  No significant abnormal uterine bleeding.  Only minimal pelvic pain.  She is satisfied with her symptomatic relief.  She had no specific recommendations on improvements to our system.  Past Medical History:  Diagnosis Date  . GERD (gastroesophageal reflux disease)   . Hypertension     Past Surgical History:  Procedure Laterality Date  .  CESAREAN SECTION    . CHOLECYSTECTOMY    . GASTRECTOMY    . GASTRIC BYPASS    . IR ANGIOGRAM PELVIS SELECTIVE OR SUPRASELECTIVE  09/06/2020  . IR ANGIOGRAM SELECTIVE EACH ADDITIONAL VESSEL  09/06/2020  . IR ANGIOGRAM SELECTIVE EACH ADDITIONAL VESSEL  09/06/2020  . IR EMBO TUMOR ORGAN ISCHEMIA INFARCT INC GUIDE ROADMAPPING  09/06/2020  . IR RADIOLOGIST EVAL & MGMT  07/11/2020  . IR RADIOLOGIST EVAL & MGMT  09/19/2020  . IR RADIOLOGIST EVAL & MGMT  05/08/2021  . IR US GUIDE VASC ACCESS LEFT  09/06/2020  . KNEE SURGERY    . WRIST FRACTURE SURGERY      Allergies: Latex and Wellbutrin xl [bupropion]  Medications: Prior to Admission medications   Medication Sig Start Date End Date Taking? Authorizing Provider  ALPRAZolam Duanne Moron) 0.5 MG tablet Take 0.5 mg by mouth at bedtime as needed for anxiety.     [provider]  amoxicillin-clavulanate (AUGMENTIN) 875-125 MG tablet Take 1 tablet by mouth 2 (two) times daily. 01/22/21   Cheyenne Adas, NP  COLLAGEN PO Take 1 tablet by mouth daily.     [provider]  escitalopram (LEXAPRO) 20 MG tablet Take 20 mg by mouth daily.    [provider]  escitalopram (LEXAPRO) 20 MG tablet Take 1 tablet by mouth daily 02/06/21     fluticasone (FLONASE) 50 MCG/ACT nasal spray 1 spray by Both Nostrils route daily.    [provider]  ibuprofen (ADVIL) 800 MG tablet Take 1 tablet (800 mg total) by mouth every 6 (six) hours. Adhere to this dose or may do 600 mg every 6 hours for next 5 days, then convert to normal dosing prn 09/07/20  Allred, Darrell K, PA-C  loratadine (CLARITIN) 10 MG tablet Take 10 mg by mouth daily.    [provider]  Multiple Vitamins-Iron (MULTIVITAMINS WITH IRON) TABS tablet Take 1 tablet by mouth daily.    [provider]  omeprazole (PRILOSEC) 40 MG capsule Take 1 capsule (40 mg total) by mouth daily. 06/26/20   Cheyenne Adas, NP  omeprazole (PRILOSEC) 40 MG capsule Take 1 capsule by mouth  daily before a meal 02/06/21     Vitamin D, Ergocalciferol, (DRISDOL) 1.25 MG (50000 UNIT) CAPS capsule Take 50,000 Units by mouth every 7 (seven) days.    [provider]     Family History  Problem Relation Age of Onset  . Heart failure Father   . Diabetes Father   . Colon cancer Neg Hx   . Stomach cancer Neg Hx   . Esophageal cancer Neg Hx   . Pancreatic cancer Neg Hx     Social History   Socioeconomic History  . Marital status: Married    Spouse name: Not on file  . Number of children: Not on file  . Years of education: Not on file  . Highest education level: Not on file  Occupational History  . Not on file  Tobacco Use  . Smoking status: Never Smoker  . Smokeless tobacco: Never Used  Vaping Use  . Vaping Use: Never used  Substance and Sexual Activity  . Alcohol use: No  . Drug use: No  . Sexual activity: Not on file  Other Topics Concern  . Not on file  Social History Narrative  . Not on file   Social Determinants of Health   Financial Resource Strain: Not on file  Food Insecurity: Not on file  Transportation Needs: Not on file  Physical Activity: Not on file  Stress: Not on file  Social Connections: Not on file    ECOG Status: 0 - Asymptomatic  Review of Systems  Review of Systems: A 12 point ROS discussed and pertinent positives are indicated in the HPI above.  All other systems are negative.  Physical Exam No direct physical exam was performed (except for noted visual exam findings with Video Visits).     Vital Signs: There were no vitals taken for this visit.  Imaging: MR PELVIS W WO CONTRAST  Result Date: 05/05/2021 CLINICAL DATA:  Follow-up uterine fibroids. Approximately 8 months status post uterine artery embolization. EXAM: MRI PELVIS WITHOUT AND WITH CONTRAST TECHNIQUE: Multiplanar multisequence MR imaging of the pelvis was performed both before and after administration of intravenous contrast. CONTRAST:  28mL GADAVIST GADOBUTROL  1 MMOL/ML IV SOLN COMPARISON:  07/19/2020 FINDINGS: Lower Urinary Tract: No bladder or urethral abnormality identified. Bowel:  Unremarkable visualized pelvic bowel loops. Vascular/Lymphatic: No pathologically enlarged lymph nodes or other significant abnormality. Reproductive: -- Uterus: Measures 9.6 x 6.7 x 6.9 cm (volume = 230 cm^3), decreased from approximately 550 cm3 on prior study. Largest fibroid in the upper uterine corpus and fundus currently measures 5.9 cm compared to 8.1 cm previously. Smaller fibroid in the right anterior lower uterine segment measures 2.2 cm. -- Intracavitary fibroids:  None. -- Pedunculated fibroids: None. -- Fibroid contrast enhancement: These fibroids show no significant residual contrast enhancement on subtraction imaging. -- Right ovary:  Appears normal.  No mass identified. -- Left ovary:  Appears normal.  No mass identified. Other: No abnormal free fluid. Musculoskeletal:  Unremarkable. IMPRESSION: Significant decrease in overall uterine volume and size of individual fibroids, as described above. Normal appearance  of both ovaries. Electronically Signed   By: Marlaine Hind M.D.   On: 05/05/2021 08:11   IR Radiologist Eval & Mgmt  Result Date: 05/08/2021 Please refer to notes tab for details about interventional procedure. (Op Note)   Labs:  CBC: Recent Labs    09/06/20 1242  WBC 7.7  HGB 14.5  HCT 41.3  PLT 267    COAGS: Recent Labs    09/06/20 1242  INR 1.0    BMP: Recent Labs    09/06/20 1242  NA 140  K 3.8  CL 108  CO2 22  GLUCOSE 93  BUN 10  CALCIUM 9.3  CREATININE 0.92  GFRNONAA >60  GFRAA >60    LIVER FUNCTION TESTS: No results for input(s): BILITOT, AST, ALT, ALKPHOS, PROT, ALBUMIN in the last 8760 hours.  TUMOR MARKERS: No results for input(s): AFPTM, CEA, CA199, CHROMGRNA in the last 8760 hours.  Assessment and Plan:   My impression is that the patient is done very well post bilateral uterine artery embolization for  symptomatic uterine fibroids.  She is in a clinical success.  The MRI looks great with significant reduction in size of the dominant fibroids.  No residual profused or enlarging fibroids.  Her uterus is near normal in size, significant provement since the preop study.  I reviewed the findings with the patient.  I would anticipate no further   fibroid symptoms, which should persist until menopause.  I reiterated the need to continue with routine schedule gynecologic follow-up.  The patient seemed to understand, and did ask appropriate questions which were answered.  I do not require any additional follow-up with our service although I am happy to see her on a as needed basis as needed.  Thank you for this interesting consult.  I greatly enjoyed meeting RASHEDA LEDGER and look forward to participating in their care.  A copy of this report was sent to the requesting provider on this date.  Electronically Signed: Rickard Rhymes 05/08/2021, 3:50 PM   I spent a total of    15 Minutes in remote  clinical consultation, greater than 50% of which was counseling/coordinating care for symptomatic uterine fibroids, post embolization.    Visit type: Audio only (telephone). Audio (no video) only due to patient's lack of internet/smartphone capability. Alternative for in-person consultation at [facility], [address], [place], [place]. This visit type was conducted due to national recommendations for restrictions regarding the COVID-19 Pandemic (e.g. social distancing).  This format is felt to be most appropriate for this patient at this time.  All issues noted in this document were discussed and addressed.

## 2021-05-23 ENCOUNTER — Other Ambulatory Visit (HOSPITAL_BASED_OUTPATIENT_CLINIC_OR_DEPARTMENT_OTHER): Payer: Self-pay

## 2021-05-23 DIAGNOSIS — E441 Mild protein-calorie malnutrition: Secondary | ICD-10-CM | POA: Diagnosis not present

## 2021-05-23 DIAGNOSIS — E559 Vitamin D deficiency, unspecified: Secondary | ICD-10-CM | POA: Diagnosis not present

## 2021-05-23 DIAGNOSIS — I10 Essential (primary) hypertension: Secondary | ICD-10-CM | POA: Diagnosis not present

## 2021-05-23 DIAGNOSIS — Z0000 Encounter for general adult medical examination without abnormal findings: Secondary | ICD-10-CM | POA: Diagnosis not present

## 2021-05-23 DIAGNOSIS — E612 Magnesium deficiency: Secondary | ICD-10-CM | POA: Diagnosis not present

## 2021-05-23 DIAGNOSIS — R5383 Other fatigue: Secondary | ICD-10-CM | POA: Diagnosis not present

## 2021-05-23 DIAGNOSIS — E669 Obesity, unspecified: Secondary | ICD-10-CM | POA: Diagnosis not present

## 2021-05-23 DIAGNOSIS — F419 Anxiety disorder, unspecified: Secondary | ICD-10-CM | POA: Diagnosis not present

## 2021-05-23 MED ORDER — ESCITALOPRAM OXALATE 20 MG PO TABS
ORAL_TABLET | ORAL | 3 refills | Status: DC
  Filled 2021-05-23 – 2021-06-03 (×2): qty 30, 30d supply, fill #0

## 2021-05-23 MED ORDER — OMEPRAZOLE 40 MG PO CPDR
DELAYED_RELEASE_CAPSULE | ORAL | 3 refills | Status: DC
  Filled 2021-05-23 – 2021-06-07 (×3): qty 30, 30d supply, fill #0
  Filled 2021-07-08: qty 30, 30d supply, fill #1
  Filled 2021-08-05: qty 30, 30d supply, fill #2

## 2021-05-30 ENCOUNTER — Other Ambulatory Visit (HOSPITAL_BASED_OUTPATIENT_CLINIC_OR_DEPARTMENT_OTHER): Payer: Self-pay

## 2021-05-30 MED ORDER — ESCITALOPRAM OXALATE 20 MG PO TABS
ORAL_TABLET | ORAL | 3 refills | Status: DC
  Filled 2021-05-30 – 2021-06-07 (×2): qty 30, 30d supply, fill #0
  Filled 2021-07-08: qty 30, 30d supply, fill #1
  Filled 2021-08-05: qty 30, 30d supply, fill #2
  Filled 2021-09-04: qty 30, 30d supply, fill #3

## 2021-05-30 MED ORDER — OMEPRAZOLE 40 MG PO CPDR
DELAYED_RELEASE_CAPSULE | ORAL | 3 refills | Status: DC
  Filled 2021-05-30 – 2021-09-04 (×2): qty 30, 30d supply, fill #0

## 2021-06-03 ENCOUNTER — Other Ambulatory Visit (HOSPITAL_BASED_OUTPATIENT_CLINIC_OR_DEPARTMENT_OTHER): Payer: Self-pay

## 2021-06-04 ENCOUNTER — Telehealth: Payer: 59 | Admitting: Emergency Medicine

## 2021-06-04 DIAGNOSIS — K649 Unspecified hemorrhoids: Secondary | ICD-10-CM

## 2021-06-04 NOTE — Progress Notes (Signed)
E-Visit for Hemorrhoid  We are sorry that you are not feeling well. We are here to help!  I contacted [employer] [facility].  They are going to refill your prescription and should let you know when it's ready.  Hemorrhoids are swollen veins in the rectum. They can cause itching, bleeding, and pain. Hemorrhoids are very common.  In some cases, you can see or feel hemorrhoids around the outside of the rectum. In other cases, you cannot see them because they are hidden inside the rectum. Be patient - It can take months for this to improve or go away.   Hemorrhoids do not always cause symptoms. But when they do, symptoms can include: ?Itching of the skin around the anus ?Bleeding - Bleeding is usually painless. You might see bright red blood after using the toilet. ?Pain - If a blood clot forms inside a hemorrhoid, this can cause pain. It can also cause a lump that you might be able to feel.   What can I do to keep from getting more hemorrhoids? -- The most important thing you can do is to keep from getting constipated. You should have a bowel movement at least a few times a week. When you have a bowel movement, you also should not have to push too much. Plus, your bowel movements should not be too hard. Being constipated and having hard bowel movements can make hemorrhoids worse.     HOME CARE: Sitz Baths twice daily. Soak buttocks in 2 or 3 inches of warm water for 10 to 15 minutes. Do not add soap, bubble bath, or anything to the water. Stool softener such as Colace 100 mg twice daily AND Miralax 1 scoop daily until you have regular soft stools Over the counter Preparation H Tucks Pads Witch Hazel  Here are some steps you can take to avoid getting constipated or having hard stools:  ?Eat lots of fruits, vegetables, and other foods with fiber. Fiber helps to increase bowel movements. If you do not get enough fiber from your diet, you can take fiber supplements. These come in the form of  powders, wafers, or pills. Some examples are Metamucil, Citrucel, Benefiber and FiberCon. If you take a fiber supplement, be sure to read the label so you know how much to take. If you're not sure, ask your provider or nurse. ?Take medicines called "stool softeners" such as docusate sodium (sample brand names: Colace, Dulcolax). These medicines increase the number of bowel movements you have. They are safe to take and they can prevent problems later.  You should request a referral for a follow up evaluation with a Gastroenterologist (GI doctor) to evaluate this chronic and relapsing condition - even if it improves to see what further steps need to be taken. This is highly linked to chronic constipation and straining to have a bowel movement. It may require further treatment or surgical intervention.   GET HELP RIGHT AWAY IF: You develop severe pain You have heavy bleeding   FOLLOW UP WITH YOUR PRIMARY PROVIDER IF: If your symptoms do not improve within 10 days  MAKE SURE YOU  Understand these instructions. Will watch your condition. Will get help right away if you are not doing well or get worse.  Your e-visit answers were reviewed by a board certified advanced clinical practitioner to complete your personal care plan. Depending upon the condition, your plan could have included both over the counter or prescription medications.  Your safety is important to [place]. If you have  drug allergies check your prescription carefully.   You can use MyChart to ask questions about today's visit, request a non-urgent call back, or ask for a work or school excuse for 24 hours related to this e-Visit. If it has been greater than 24 hours you will need to follow up with your provider, or enter a new e-Visit to address those concerns.  You will get an e-mail with a link to a survey asking about your experience.  We hope that your e-visit has been valuable and will speed your recovery! Thank you for using  e-visits.  Approximately 5 minutes was used in reviewing the patient's chart, questionnaire, prescribing medications, and documentation.

## 2021-06-07 ENCOUNTER — Other Ambulatory Visit (HOSPITAL_BASED_OUTPATIENT_CLINIC_OR_DEPARTMENT_OTHER): Payer: Self-pay

## 2021-07-08 ENCOUNTER — Other Ambulatory Visit (HOSPITAL_BASED_OUTPATIENT_CLINIC_OR_DEPARTMENT_OTHER): Payer: Self-pay

## 2021-07-15 ENCOUNTER — Other Ambulatory Visit (HOSPITAL_BASED_OUTPATIENT_CLINIC_OR_DEPARTMENT_OTHER): Payer: Self-pay

## 2021-07-15 MED ORDER — ALPRAZOLAM 0.5 MG PO TABS
ORAL_TABLET | ORAL | 0 refills | Status: DC
  Filled 2021-07-15: qty 30, 30d supply, fill #0

## 2021-07-25 ENCOUNTER — Other Ambulatory Visit (HOSPITAL_BASED_OUTPATIENT_CLINIC_OR_DEPARTMENT_OTHER): Payer: Self-pay

## 2021-07-25 DIAGNOSIS — Z1231 Encounter for screening mammogram for malignant neoplasm of breast: Secondary | ICD-10-CM | POA: Diagnosis not present

## 2021-07-25 DIAGNOSIS — Z1389 Encounter for screening for other disorder: Secondary | ICD-10-CM | POA: Diagnosis not present

## 2021-07-25 DIAGNOSIS — Z1151 Encounter for screening for human papillomavirus (HPV): Secondary | ICD-10-CM | POA: Diagnosis not present

## 2021-07-25 DIAGNOSIS — Z01419 Encounter for gynecological examination (general) (routine) without abnormal findings: Secondary | ICD-10-CM | POA: Diagnosis not present

## 2021-07-25 DIAGNOSIS — Z113 Encounter for screening for infections with a predominantly sexual mode of transmission: Secondary | ICD-10-CM | POA: Diagnosis not present

## 2021-07-25 DIAGNOSIS — Z124 Encounter for screening for malignant neoplasm of cervix: Secondary | ICD-10-CM | POA: Diagnosis not present

## 2021-07-25 DIAGNOSIS — K602 Anal fissure, unspecified: Secondary | ICD-10-CM | POA: Diagnosis not present

## 2021-07-25 DIAGNOSIS — Z6841 Body Mass Index (BMI) 40.0 and over, adult: Secondary | ICD-10-CM | POA: Diagnosis not present

## 2021-07-25 DIAGNOSIS — Z130 Encounter for screening for diseases of the blood and blood-forming organs and certain disorders involving the immune mechanism: Secondary | ICD-10-CM | POA: Diagnosis not present

## 2021-07-25 MED ORDER — PREMARIN 0.625 MG/GM VA CREA
TOPICAL_CREAM | VAGINAL | 1 refills | Status: DC
  Filled 2021-07-25: qty 30, 30d supply, fill #0

## 2021-08-05 ENCOUNTER — Other Ambulatory Visit (HOSPITAL_BASED_OUTPATIENT_CLINIC_OR_DEPARTMENT_OTHER): Payer: Self-pay

## 2021-08-12 DIAGNOSIS — R309 Painful micturition, unspecified: Secondary | ICD-10-CM | POA: Diagnosis not present

## 2021-08-12 DIAGNOSIS — N898 Other specified noninflammatory disorders of vagina: Secondary | ICD-10-CM | POA: Diagnosis not present

## 2021-08-13 DIAGNOSIS — R309 Painful micturition, unspecified: Secondary | ICD-10-CM | POA: Diagnosis not present

## 2021-08-16 ENCOUNTER — Other Ambulatory Visit (HOSPITAL_BASED_OUTPATIENT_CLINIC_OR_DEPARTMENT_OTHER): Payer: Self-pay

## 2021-08-16 MED ORDER — NITROFURANTOIN MONOHYD MACRO 100 MG PO CAPS
ORAL_CAPSULE | ORAL | 0 refills | Status: DC
  Filled 2021-08-16: qty 14, 7d supply, fill #0

## 2021-09-04 ENCOUNTER — Other Ambulatory Visit (HOSPITAL_BASED_OUTPATIENT_CLINIC_OR_DEPARTMENT_OTHER): Payer: Self-pay

## 2021-09-04 MED ORDER — AMPICILLIN 500 MG PO CAPS
500.0000 mg | ORAL_CAPSULE | Freq: Three times a day (TID) | ORAL | 0 refills | Status: DC
  Filled 2021-09-04: qty 30, 10d supply, fill #0

## 2021-09-09 ENCOUNTER — Telehealth: Payer: 59 | Admitting: Family Medicine

## 2021-09-09 ENCOUNTER — Other Ambulatory Visit (HOSPITAL_BASED_OUTPATIENT_CLINIC_OR_DEPARTMENT_OTHER): Payer: Self-pay

## 2021-09-09 DIAGNOSIS — T3695XA Adverse effect of unspecified systemic antibiotic, initial encounter: Secondary | ICD-10-CM | POA: Diagnosis not present

## 2021-09-09 DIAGNOSIS — B379 Candidiasis, unspecified: Secondary | ICD-10-CM

## 2021-09-09 DIAGNOSIS — K649 Unspecified hemorrhoids: Secondary | ICD-10-CM

## 2021-09-09 MED ORDER — FLUCONAZOLE 150 MG PO TABS
150.0000 mg | ORAL_TABLET | Freq: Once | ORAL | 0 refills | Status: AC
  Filled 2021-09-09: qty 1, 1d supply, fill #0

## 2021-09-09 NOTE — Progress Notes (Signed)
Ms. Krista Fisher are scheduled for a virtual visit with your provider today.    Just as we do with appointments in the office, we must obtain your consent to participate.  Your consent will be active for this visit and any virtual visit you may have with one of our providers in the next 365 days.    If you have a MyChart account, I can also send a copy of this consent to you electronically.  All virtual visits are billed to your insurance company just like a traditional visit in the office.  As this is a virtual visit, video technology does not allow for your provider to perform a traditional examination.  This may limit your provider's ability to fully assess your condition.  If your provider identifies any concerns that need to be evaluated in person or the need to arrange testing such as labs, EKG, etc, we will make arrangements to do so.    Although advances in technology are sophisticated, we cannot ensure that it will always work on either your end or our end.  If the connection with a video visit is poor, we may have to switch to a telephone visit.  With either a video or telephone visit, we are not always able to ensure that we have a secure connection.   I need to obtain your verbal consent now.   Are you willing to proceed with your visit today?   Krista Fisher has provided verbal consent on 09/09/2021 for a virtual visit (video or telephone).   Perlie Mayo, NP 09/09/2021  10:45 AM   Date:  09/09/2021   ID:  Krista Fisher, Krista Fisher 1976-07-23, MRN [id number]  Patient Location: Home Provider Location: Home Office   Participants: Patient and Provider for Visit and Wrap up  Method of visit: Video  Location of Patient: Home Location of Provider: Home Office Consent was obtain for visit over the video. Services rendered by provider: Visit was performed via video  A video enabled telemedicine application was used and I verified that I am speaking with the correct person using two  identifiers.  PCP:  Mardi Mainland, FNP   Chief Complaint:  hemorrhoids   History of Present Illness:    Krista Fisher is a 45 y.o. female with history as stated below. Presents video telehealth for an acute care visit due to increased frequency of stools after being put on an antibiotic for UTI. She has noted some increased pressure/fullness and tenderness to the hemorrhoid area. Denies excessive blood in stool or toilet. No other changes on bladder or bowel habits. Noted yeast risk with antibiotic as well.  Denies abdomen or flank pain, no fevers or chills.   Past Medical, Surgical, Social History, Allergies, and Medications have been Reviewed.  Past Medical History:  Diagnosis Date   GERD (gastroesophageal reflux disease)    Hypertension     No outpatient medications have been marked as taking for the 09/09/21 encounter (Appointment) with [place].     Allergies:   Latex and Wellbutrin xl [bupropion]   ROS See HPI for history of present illness.  Physical Exam Constitutional:      Appearance: Normal appearance.  HENT:     Head: Normocephalic and atraumatic.     Right Ear: External ear normal.     Left Ear: External ear normal.  Pulmonary:     Effort: Pulmonary effort is normal.  Musculoskeletal:        General: Normal range of  motion.     Cervical back: Normal range of motion.  Neurological:     Mental Status: She is alert and oriented to person, place, and time.  Psychiatric:        Mood and Affect: Mood normal.        Behavior: Behavior normal.        Thought Content: Thought content normal.        Judgment: Judgment normal.              A&P  1. Antibiotic-induced yeast infection On antibiotic for UTI has caused yeast infection  Diflucan provided   Reviewed side effects, risks and benefits of medication.   Patient acknowledged agreement and understanding of the plan.    - fluconazole (DIFLUCAN) 150 MG tablet; Take 1 tablet  (150 mg total) by mouth once for 1 dose.  Dispense: 1 tablet; Refill: 0  2. Hemorrhoids, unspecified hemorrhoid type Needs refill on Nifedipine 0.3% ointment due to stools causing flare of hemorrhoids Refill called in- unable to order e scripted  Patient acknowledged agreement and understanding of the plan.   I discussed the assessment and treatment plan with the patient. The patient was provided an opportunity to ask questions and all were answered. The patient agreed with the plan and demonstrated an understanding of the instructions.   The patient was advised to call back or seek an in-person evaluation if the symptoms worsen or if the condition fails to improve as anticipated.   The above assessment and management plan was discussed with the patient. The patient verbalized understanding of and has agreed to the management plan. Patient is aware to call the clinic if symptoms persist or worsen. Patient is aware when to return to the clinic for a follow-up visit. Patient educated on when it is appropriate to go to the emergency department.   Time:   Today, I have spent 10 minutes with the patient with telehealth technology discussing the above problems, reviewing the chart, previous notes, medications and orders.   Medication Changes: No orders of the defined types were placed in this encounter.    Disposition:  Follow up PCP Signed, Perlie Mayo, NP  09/09/2021 10:45 AM

## 2021-09-09 NOTE — Patient Instructions (Signed)
I appreciate the opportunity to provide you with care for your health and wellness.  Hope you feel better soon!   Please continue to practice social distancing to keep you, your family, and our community safe.  If you must go out, please wear a mask and practice good handwashing.  Have a wonderful day. With Gratitude, Cherly Beach, DNP, AGNP-BC

## 2021-09-14 ENCOUNTER — Emergency Department (HOSPITAL_BASED_OUTPATIENT_CLINIC_OR_DEPARTMENT_OTHER): Payer: 59

## 2021-09-14 ENCOUNTER — Emergency Department (HOSPITAL_BASED_OUTPATIENT_CLINIC_OR_DEPARTMENT_OTHER)
Admission: EM | Admit: 2021-09-14 | Discharge: 2021-09-14 | Disposition: A | Discharge status: Home / Self Care | Payer: 59 | Attending: Emergency Medicine | Admitting: Emergency Medicine

## 2021-09-14 ENCOUNTER — Encounter (HOSPITAL_BASED_OUTPATIENT_CLINIC_OR_DEPARTMENT_OTHER): Payer: Self-pay | Admitting: Emergency Medicine

## 2021-09-14 ENCOUNTER — Other Ambulatory Visit: Payer: Self-pay

## 2021-09-14 DIAGNOSIS — I10 Essential (primary) hypertension: Secondary | ICD-10-CM | POA: Diagnosis not present

## 2021-09-14 DIAGNOSIS — K219 Gastro-esophageal reflux disease without esophagitis: Secondary | ICD-10-CM | POA: Insufficient documentation

## 2021-09-14 DIAGNOSIS — K7689 Other specified diseases of liver: Secondary | ICD-10-CM | POA: Insufficient documentation

## 2021-09-14 DIAGNOSIS — Z91040 Latex allergy status: Secondary | ICD-10-CM | POA: Diagnosis not present

## 2021-09-14 DIAGNOSIS — Z79899 Other long term (current) drug therapy: Secondary | ICD-10-CM | POA: Insufficient documentation

## 2021-09-14 DIAGNOSIS — R109 Unspecified abdominal pain: Secondary | ICD-10-CM | POA: Diagnosis not present

## 2021-09-14 DIAGNOSIS — R7989 Other specified abnormal findings of blood chemistry: Secondary | ICD-10-CM

## 2021-09-14 DIAGNOSIS — R160 Hepatomegaly, not elsewhere classified: Secondary | ICD-10-CM

## 2021-09-14 DIAGNOSIS — R945 Abnormal results of liver function studies: Secondary | ICD-10-CM | POA: Insufficient documentation

## 2021-09-14 DIAGNOSIS — R1011 Right upper quadrant pain: Secondary | ICD-10-CM | POA: Diagnosis present

## 2021-09-14 LAB — COMPREHENSIVE METABOLIC PANEL
ALT: 48 U/L — ABNORMAL HIGH (ref 0–44)
AST: 48 U/L — ABNORMAL HIGH (ref 15–41)
Albumin: 3.7 g/dL (ref 3.5–5.0)
Alkaline Phosphatase: 178 U/L — ABNORMAL HIGH (ref 38–126)
Anion gap: 3 — ABNORMAL LOW (ref 5–15)
BUN: 14 mg/dL (ref 6–20)
CO2: 25 mmol/L (ref 22–32)
Calcium: 8.6 mg/dL — ABNORMAL LOW (ref 8.9–10.3)
Chloride: 111 mmol/L (ref 98–111)
Creatinine, Ser: 0.88 mg/dL (ref 0.44–1.00)
GFR, Estimated: >60 mL/min (ref >60)
Glucose, Bld: 90 mg/dL (ref 70–99)
Potassium: 4.4 mmol/L (ref 3.5–5.1)
Sodium: 139 mmol/L (ref 135–145)
Total Bilirubin: 0.2 mg/dL — ABNORMAL LOW (ref 0.3–1.2)
Total Protein: 7.1 g/dL (ref 6.5–8.1)

## 2021-09-14 LAB — CBC WITH DIFFERENTIAL/PLATELET
Abs Immature Granulocytes: 0.02 10*3/uL (ref 0.00–0.07)
Basophils Absolute: 0.1 10*3/uL (ref 0.0–0.1)
Basophils Relative: 1 %
Eosinophils Absolute: 0.2 10*3/uL (ref 0.0–0.5)
Eosinophils Relative: 2 %
HCT: 37.4 % (ref 36.0–46.0)
Hemoglobin: 12.9 g/dL (ref 12.0–15.0)
Immature Granulocytes: 0 %
Lymphocytes Relative: 30 %
Lymphs Abs: 2.7 10*3/uL (ref 0.7–4.0)
MCH: 26.3 pg (ref 26.0–34.0)
MCHC: 34.5 g/dL (ref 30.0–36.0)
MCV: 76.3 fL — ABNORMAL LOW (ref 80.0–100.0)
Monocytes Absolute: 0.9 10*3/uL (ref 0.1–1.0)
Monocytes Relative: 10 %
Neutro Abs: 5 10*3/uL (ref 1.7–7.7)
Neutrophils Relative %: 57 %
Platelets: 219 10*3/uL (ref 150–400)
RBC: 4.9 MIL/uL (ref 3.87–5.11)
RDW: 13.9 % (ref 11.5–15.5)
WBC: 8.9 10*3/uL (ref 4.0–10.5)
nRBC: 0 % (ref 0.0–0.2)

## 2021-09-14 LAB — URINALYSIS, ROUTINE W REFLEX MICROSCOPIC
Glucose, UA: NEGATIVE mg/dL
Hgb urine dipstick: NEGATIVE
Ketones, ur: NEGATIVE mg/dL
Leukocytes,Ua: NEGATIVE
Nitrite: NEGATIVE
Protein, ur: NEGATIVE mg/dL
Specific Gravity, Urine: 1.025 (ref 1.005–1.030)
pH: 6 (ref 5.0–8.0)

## 2021-09-14 LAB — LIPASE, BLOOD: Lipase: 36 U/L (ref 11–51)

## 2021-09-14 LAB — PREGNANCY, URINE: Preg Test, Ur: NEGATIVE

## 2021-09-14 MED ORDER — FENTANYL CITRATE PF 50 MCG/ML IJ SOSY
50.0000 ug | PREFILLED_SYRINGE | Freq: Once | INTRAMUSCULAR | Status: DC
  Filled 2021-09-14: qty 1

## 2021-09-14 MED ORDER — DICYCLOMINE HCL 20 MG PO TABS: 20.0000 mg | ORAL_TABLET | Freq: Two times a day (BID) | ORAL | 0 refills | Status: DC

## 2021-09-14 MED ORDER — ALUM & MAG HYDROXIDE-SIMETH 200-200-20 MG/5ML PO SUSP: 30.0000 mL | Freq: Once | ORAL | Status: DC

## 2021-09-14 MED ORDER — ACETAMINOPHEN 500 MG PO TABS
1000.0000 mg | ORAL_TABLET | Freq: Once | ORAL | Status: AC
  Administered 2021-09-14: 1000 mg via ORAL
  Filled 2021-09-14: qty 2

## 2021-09-14 MED ORDER — ONDANSETRON HCL 4 MG/2ML IJ SOLN
4.0000 mg | Freq: Once | INTRAMUSCULAR | Status: AC
  Administered 2021-09-14: 4 mg via INTRAVENOUS
  Filled 2021-09-14: qty 2

## 2021-09-14 MED ORDER — IOHEXOL 350 MG/ML SOLN
100.0000 mL | Freq: Once | INTRAVENOUS | Status: AC | PRN
  Administered 2021-09-14: 100 mL via INTRAVENOUS

## 2021-09-14 MED ORDER — SUCRALFATE 1 GM/10ML PO SUSP
1.0000 g | Freq: Three times a day (TID) | ORAL | 0 refills | Status: DC
Start: 2021-09-14 — End: 2022-02-25

## 2021-09-14 MED ORDER — SODIUM CHLORIDE 0.9 % IV SOLN: INTRAVENOUS | Status: DC

## 2021-09-14 NOTE — ED Notes (Addendum)
Upon IV insertion attempt by this RN PT begins stating that the IV insertion is hurting and begins moving her right arm causing a risk for infiltration. This RN states to Pt that there is flash in the needle and that this RN will need the PT to remain as still as possible while the IV is threaded into the vein. Pt demands that this RN discontinue IV insertion stating, "That is not proper technique! You have no idea what you are doing!". When this RN d/c IV and showed PT that flash was obtained the PT states, "Do not argue with me! I said you are done!". Charge notified for continued need of IV catheter for CT and blood work. MD notified at this time for difficult IV insertion.

## 2021-09-14 NOTE — ED Notes (Addendum)
MD at PT bedside at this time. MD asking PT if there is anything we can offer for her pain. PT states, "Maybe some tylenol.". ED MD educates PT about this risks of PO medication before having a resulted CT and that we offer pain control intravenously in case of a need for surgery. PT still refuses and states that she will let us know if anything changes.

## 2021-09-14 NOTE — ED Triage Notes (Signed)
Pt to ED from home with c/o upper Abd pain that began Wednesday. Pt states she has had flatulence and thought it was gas. Pt has taken GAS-X with no relief.

## 2021-09-14 NOTE — ED Provider Notes (Signed)
[name] EMERGENCY DEPARTMENT Provider Note   CSN: [id number] Arrival date & time: 09/14/21  0141     History Chief Complaint  Patient presents with   Abdominal Pain    Krista Fisher is a 45 y.o. female.   Abdominal Pain Pain location:  RUQ and LUQ (above the umbilicus) Pain quality comment:  "like gas" Pain radiates to:  Does not radiate Pain severity:  Severe Onset quality:  Gradual Duration:  3 days Timing:  Constant Progression:  Worsening Chronicity:  New Context: not trauma   Relieved by:  Nothing Worsened by:  Nothing Ineffective treatments:  None tried Associated symptoms: flatus   Associated symptoms: no diarrhea, no fever and no vomiting   Associated symptoms comment:  Passing flatus  Risk factors: multiple surgeries   Risk factors comment:  S/p cholecystectomy, s/p gastric sleeve, s/p duodenal switch     Past Medical History:  Diagnosis Date   GERD (gastroesophageal reflux disease)    Hypertension     Patient Active Problem List   Diagnosis Date Noted   Fibroid 09/06/2020   Fibroids 09/06/2020   Bariatric surgery status 01/17/2020   Excessive daytime sleepiness 01/17/2020   Hypersomnia with sleep apnea 01/17/2020   Loud snoring 01/17/2020   Morbid obesity with body mass index of 50 or higher ([place]) 01/17/2020   Arm numbness 03/08/2018   EDEMA 02/04/2011   CHEST PAIN-UNSPECIFIED 02/04/2011   VITAMIN D DEFICIENCY 12/03/2009   EAR PAIN, BILATERAL 12/03/2009   ALLERGIC RHINITIS 12/03/2009   Morbid obesity ([place]) 10/31/2009   DEPRESSION 10/31/2009   HYPERTENSION, BENIGN ESSENTIAL 10/31/2009   BLOCK, OTHER HEART 10/31/2009   URI 10/31/2009   GERD 10/31/2009   SLEEP APNEA 10/31/2009   PALPITATIONS 10/31/2009   1st degree AV block 10/31/2009    Past Surgical History:  Procedure Laterality Date   CESAREAN SECTION     CHOLECYSTECTOMY     GASTRECTOMY     GASTRIC BYPASS     IR ANGIOGRAM PELVIS SELECTIVE OR SUPRASELECTIVE   09/06/2020   IR ANGIOGRAM SELECTIVE EACH ADDITIONAL VESSEL  09/06/2020   IR ANGIOGRAM SELECTIVE EACH ADDITIONAL VESSEL  09/06/2020   IR EMBO TUMOR ORGAN ISCHEMIA INFARCT INC GUIDE ROADMAPPING  09/06/2020   IR RADIOLOGIST EVAL & MGMT  07/11/2020   IR RADIOLOGIST EVAL & MGMT  09/19/2020   IR RADIOLOGIST EVAL & MGMT  05/08/2021   IR US GUIDE VASC ACCESS LEFT  09/06/2020   KNEE SURGERY     WRIST FRACTURE SURGERY       OB History   No obstetric history on file.     Family History  Problem Relation Age of Onset   Heart failure Father    Diabetes Father    Colon cancer Neg Hx    Stomach cancer Neg Hx    Esophageal cancer Neg Hx    Pancreatic cancer Neg Hx     Social History   Tobacco Use   Smoking status: Never   Smokeless tobacco: Never  Vaping Use   Vaping Use: Never used  Substance Use Topics   Alcohol use: No   Drug use: No    Home Medications Prior to Admission medications   Medication Sig Start Date End Date Taking? Authorizing Provider  ALPRAZolam Duanne Moron) 0.5 MG tablet Take 0.5 mg by mouth at bedtime as needed for anxiety.     [provider]  ALPRAZolam Duanne Moron) 0.5 MG tablet Take 1 tablet by mouth daily as needed 07/15/21  amoxicillin-clavulanate (AUGMENTIN) 875-125 MG tablet Take 1 tablet by mouth 2 (two) times daily. 01/22/21   Cheyenne Adas, NP  ampicillin (PRINCIPEN) 500 MG capsule Take 1 capsule (500 mg total) by mouth every 8 (eight) hours for 10 days. 09/04/21     COLLAGEN PO Take 1 tablet by mouth daily.     [provider]  conjugated estrogens (PREMARIN) vaginal cream Insert 0.5 applicatorsful twice a day by vaginal route as needed for 10 days. 07/25/21     escitalopram (LEXAPRO) 20 MG tablet Take 20 mg by mouth daily.    [provider]  escitalopram (LEXAPRO) 20 MG tablet Take 1 tablet by mouth once daily 05/23/21     escitalopram (LEXAPRO) 20 MG tablet take 1 tablet by mouth once daily 05/29/21     fluticasone (FLONASE) 50 MCG/ACT nasal  spray 1 spray by Both Nostrils route daily.    [provider]  ibuprofen (ADVIL) 800 MG tablet Take 1 tablet (800 mg total) by mouth every 6 (six) hours. Adhere to this dose or may do 600 mg every 6 hours for next 5 days, then convert to normal dosing prn 09/07/20   Allred, Darrell K, PA-C  loratadine (CLARITIN) 10 MG tablet Take 10 mg by mouth daily.    [provider]  Multiple Vitamins-Iron (MULTIVITAMINS WITH IRON) TABS tablet Take 1 tablet by mouth daily.    [provider]  nitrofurantoin, macrocrystal-monohydrate, (MACROBID) 100 MG capsule Take 1 capsule every 12 hours by mouth for 7 days. 08/16/21     omeprazole (PRILOSEC) 40 MG capsule Take 1 capsule (40 mg total) by mouth daily. 06/26/20   Cheyenne Adas, NP  omeprazole (PRILOSEC) 40 MG capsule Take 1 capsule by mouth once daily before a meal 05/23/21     omeprazole (PRILOSEC) 40 MG capsule take 1 capsule by mouth once daily before a meal 05/29/21     Vitamin D, Ergocalciferol, (DRISDOL) 1.25 MG (50000 UNIT) CAPS capsule Take 50,000 Units by mouth every 7 (seven) days.    [provider]    Allergies    Latex and Wellbutrin xl [bupropion]  Review of Systems   Review of Systems  Constitutional:  Negative for fever.  HENT:  Negative for facial swelling.   Eyes:  Negative for redness.  Respiratory:  Negative for wheezing and stridor.   Gastrointestinal:  Positive for abdominal pain and flatus. Negative for diarrhea and vomiting.  All other systems reviewed and are negative.  Physical Exam Updated Vital Signs BP (!) 96/56 (BP Location: Right Arm)   Pulse 83   Temp 98.5 F (36.9 C) (Oral)   Resp 18   Ht 5\' 6"  (1.676 m)   Wt 111.1 kg   SpO2 99%   BMI 39.54 kg/m   Physical Exam  ED Results / Procedures / Treatments   Labs (all labs ordered are listed, but only abnormal results are displayed) Results for orders placed or performed during the hospital encounter of 09/14/21  CBC with  Differential/Platelet  Result Value Ref Range   WBC 8.9 4.0 - 10.5 K/uL   RBC 4.90 3.87 - 5.11 MIL/uL   Hemoglobin 12.9 12.0 - 15.0 g/dL   HCT 37.4 36.0 - 46.0 %   MCV 76.3 (L) 80.0 - 100.0 fL   MCH 26.3 26.0 - 34.0 pg   MCHC 34.5 30.0 - 36.0 g/dL   RDW 13.9 11.5 - 15.5 %   Platelets 219 150 - 400 K/uL   nRBC 0.0 0.0 -  0.2 %   Neutrophils Relative % 57 %   Neutro Abs 5.0 1.7 - 7.7 K/uL   Lymphocytes Relative 30 %   Lymphs Abs 2.7 0.7 - 4.0 K/uL   Monocytes Relative 10 %   Monocytes Absolute 0.9 0.1 - 1.0 K/uL   Eosinophils Relative 2 %   Eosinophils Absolute 0.2 0.0 - 0.5 K/uL   Basophils Relative 1 %   Basophils Absolute 0.1 0.0 - 0.1 K/uL   Immature Granulocytes 0 %   Abs Immature Granulocytes 0.02 0.00 - 0.07 K/uL  Comprehensive metabolic panel  Result Value Ref Range   Sodium 139 135 - 145 mmol/L   Potassium 4.4 3.5 - 5.1 mmol/L   Chloride 111 98 - 111 mmol/L   CO2 25 22 - 32 mmol/L   Glucose, Bld 90 70 - 99 mg/dL   BUN 14 6 - 20 mg/dL   Creatinine, Ser 0.88 0.44 - 1.00 mg/dL   Calcium 8.6 (L) 8.9 - 10.3 mg/dL   Total Protein 7.1 6.5 - 8.1 g/dL   Albumin 3.7 3.5 - 5.0 g/dL   AST 48 (H) 15 - 41 U/L   ALT 48 (H) 0 - 44 U/L   Alkaline Phosphatase 178 (H) 38 - 126 U/L   Total Bilirubin 0.2 (L) 0.3 - 1.2 mg/dL   GFR, Estimated >60 >60 mL/min   Anion gap 3 (L) 5 - 15  Lipase, blood  Result Value Ref Range   Lipase 36 11 - 51 U/L  Urinalysis, Routine w reflex microscopic Urine, Clean Catch  Result Value Ref Range   Color, Urine YELLOW YELLOW   APPearance CLEAR CLEAR   Specific Gravity, Urine 1.025 1.005 - 1.030   pH 6.0 5.0 - 8.0   Glucose, UA NEGATIVE NEGATIVE mg/dL   Hgb urine dipstick NEGATIVE NEGATIVE   Bilirubin Urine MODERATE (A) NEGATIVE   Ketones, ur NEGATIVE NEGATIVE mg/dL   Protein, ur NEGATIVE NEGATIVE mg/dL   Nitrite NEGATIVE NEGATIVE   Leukocytes,Ua NEGATIVE NEGATIVE  Pregnancy, urine  Result Value Ref Range   Preg Test, Ur NEGATIVE NEGATIVE    No results found.   Radiology No results found.  Procedures Procedures   Medications Ordered in ED Medications  fentaNYL (SUBLIMAZE) injection 50 mcg (has no administration in time range)  0.9 %  sodium chloride infusion (has no administration in time range)  ondansetron (ZOFRAN) injection 4 mg (has no administration in time range)    ED Course  I have reviewed the triage vital signs and the nursing notes.  Pertinent labs & imaging results that were available during my care of the patient were reviewed by me and considered in my medical decision making (see chart for details).   EDP immediately ordered pain medication for patient.  Nurse has informed EDP that patient has refused IV pain medication  EDP went to patient's room to readdress patient's comfort as she has not had pain medication.  Patient has refused as she is concerned this will affect her.  EDP stated it was ordered for her comfort as EDP does not want the patient to continue to have pain.  Patient has declined pain medication at this time.     247am:  EDP has reviewed liver function tests from [facility] from 09/2020 and today's AST and ALT are improved from 2021 but still above the upper limit of normal.    249am: I have informed the patient of the AST/ALT which are both 48 and that they are improved from testing  at [facility] but still above the top limit of normal.   510am: case d/w Dr. Alisia Ferrari bariatric surgeon On call for Dr. Ranjan [place] at [facility].  EDP informed [facility] of hepatic mass seen on previous CT scan ordered by Dr. Saint Lucia in December 2021.  It has increased in size and will need abdominal MRI.  No additional work up at this time.  Please discharge and have patient call the office on Monday to be seen regarding mass and abdominal pain.    Final Clinical Impression(s) / ED Diagnoses  Return for intractable cough, coughing up blood, fevers > 100.4 unrelieved by medication, shortness of breath, intractable  vomiting, chest pain, shortness of breath, weakness, numbness, changes in speech, facial asymmetry, abdominal pain, passing out, Inability to tolerate liquids or food, cough, altered mental status or any concerns. No signs of systemic illness or infection. The patient is nontoxic-appearing on exam and vital signs are within normal limits. I have reviewed the triage vital signs and the nursing notes. Pertinent labs & imaging results that were available during my care of the patient were reviewed by me and considered in my medical decision making (see chart for details). After history, exam, and medical workup I feel the patient has been appropriately medically screened and is safe for discharge home. Pertinent diagnoses were discussed with the patient. Patient was given return precautions.   Willys Salvino, MD 09/14/21 [phone]

## 2021-09-27 ENCOUNTER — Ambulatory Visit
Admission: EM | Admit: 2021-09-27 | Discharge: 2021-09-27 | Disposition: A | Discharge status: Home / Self Care | Payer: 59 | Attending: Emergency Medicine | Admitting: Emergency Medicine

## 2021-09-27 ENCOUNTER — Other Ambulatory Visit: Payer: Self-pay

## 2021-09-27 ENCOUNTER — Ambulatory Visit: Payer: PRIVATE HEALTH INSURANCE | Admitting: Nurse Practitioner

## 2021-09-27 ENCOUNTER — Other Ambulatory Visit (HOSPITAL_BASED_OUTPATIENT_CLINIC_OR_DEPARTMENT_OTHER): Payer: Self-pay

## 2021-09-27 DIAGNOSIS — G4700 Insomnia, unspecified: Secondary | ICD-10-CM

## 2021-09-27 DIAGNOSIS — F99 Mental disorder, not otherwise specified: Secondary | ICD-10-CM

## 2021-09-27 DIAGNOSIS — T7411XA Adult physical abuse, confirmed, initial encounter: Secondary | ICD-10-CM | POA: Diagnosis not present

## 2021-09-27 DIAGNOSIS — F5105 Insomnia due to other mental disorder: Secondary | ICD-10-CM

## 2021-09-27 DIAGNOSIS — M542 Cervicalgia: Secondary | ICD-10-CM

## 2021-09-27 DIAGNOSIS — F4310 Post-traumatic stress disorder, unspecified: Secondary | ICD-10-CM | POA: Diagnosis not present

## 2021-09-27 DIAGNOSIS — T7491XA Unspecified adult maltreatment, confirmed, initial encounter: Secondary | ICD-10-CM

## 2021-09-27 MED ORDER — IBUPROFEN 600 MG PO TABS
600.0000 mg | ORAL_TABLET | Freq: Four times a day (QID) | ORAL | 0 refills | Status: DC | PRN
  Filled 2021-09-27: qty 30, 8d supply, fill #0

## 2021-09-27 MED ORDER — KETOROLAC TROMETHAMINE 60 MG/2ML IM SOLN
60.0000 mg | Freq: Once | INTRAMUSCULAR | Status: AC
  Administered 2021-09-27: 60 mg via INTRAMUSCULAR

## 2021-09-27 MED ORDER — TEMAZEPAM 30 MG PO CAPS
30.0000 mg | ORAL_CAPSULE | Freq: Every evening | ORAL | 0 refills | Status: DC | PRN
Start: 2021-09-27 — End: 2022-02-25
  Filled 2021-09-27: qty 14, 14d supply, fill #0
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
## 2021-10-07 ENCOUNTER — Other Ambulatory Visit (HOSPITAL_BASED_OUTPATIENT_CLINIC_OR_DEPARTMENT_OTHER): Payer: Self-pay

## 2021-10-07 DIAGNOSIS — F419 Anxiety disorder, unspecified: Secondary | ICD-10-CM | POA: Diagnosis not present

## 2021-10-07 DIAGNOSIS — G4700 Insomnia, unspecified: Secondary | ICD-10-CM | POA: Diagnosis not present

## 2021-10-07 DIAGNOSIS — T7491XA Unspecified adult maltreatment, confirmed, initial encounter: Secondary | ICD-10-CM | POA: Diagnosis not present

## 2021-10-07 MED ORDER — OMEPRAZOLE 40 MG PO CPDR
DELAYED_RELEASE_CAPSULE | ORAL | 3 refills | Status: DC
  Filled 2021-10-07: qty 30, 30d supply, fill #0
  Filled 2021-11-11: qty 30, 30d supply, fill #1
  Filled 2021-12-10: qty 30, 30d supply, fill #2
  Filled 2022-01-08: qty 30, 30d supply, fill #3

## 2021-10-07 MED ORDER — HYDROXYZINE HCL 50 MG PO TABS
ORAL_TABLET | ORAL | 2 refills | Status: DC
  Filled 2021-10-07: qty 90, 30d supply, fill #0
  Filled 2022-01-08: qty 90, 30d supply, fill #1

## 2021-10-07 MED ORDER — ESCITALOPRAM OXALATE 20 MG PO TABS
ORAL_TABLET | ORAL | 3 refills | Status: DC
  Filled 2021-10-07: qty 30, 30d supply, fill #0
  Filled 2021-11-11: qty 30, 30d supply, fill #1
  Filled 2021-12-10: qty 30, 30d supply, fill #2
  Filled 2022-01-08: qty 30, 30d supply, fill #3

## 2021-10-09 ENCOUNTER — Other Ambulatory Visit (HOSPITAL_BASED_OUTPATIENT_CLINIC_OR_DEPARTMENT_OTHER): Payer: Self-pay

## 2021-10-15 ENCOUNTER — Encounter (HOSPITAL_COMMUNITY): Payer: Self-pay | Admitting: Radiology

## 2021-11-11 ENCOUNTER — Other Ambulatory Visit (HOSPITAL_BASED_OUTPATIENT_CLINIC_OR_DEPARTMENT_OTHER): Payer: Self-pay

## 2021-12-10 ENCOUNTER — Other Ambulatory Visit (HOSPITAL_BASED_OUTPATIENT_CLINIC_OR_DEPARTMENT_OTHER): Payer: Self-pay

## 2022-01-08 ENCOUNTER — Other Ambulatory Visit (HOSPITAL_BASED_OUTPATIENT_CLINIC_OR_DEPARTMENT_OTHER): Payer: Self-pay

## 2022-01-13 ENCOUNTER — Other Ambulatory Visit (HOSPITAL_BASED_OUTPATIENT_CLINIC_OR_DEPARTMENT_OTHER): Payer: Self-pay

## 2022-01-23 ENCOUNTER — Telehealth: Payer: 59 | Admitting: Physician Assistant

## 2022-01-23 ENCOUNTER — Other Ambulatory Visit (HOSPITAL_BASED_OUTPATIENT_CLINIC_OR_DEPARTMENT_OTHER): Payer: Self-pay

## 2022-01-23 DIAGNOSIS — B379 Candidiasis, unspecified: Secondary | ICD-10-CM | POA: Diagnosis not present

## 2022-01-23 MED ORDER — ALPRAZOLAM 0.5 MG PO TABS
0.5000 mg | ORAL_TABLET | ORAL | 0 refills | Status: DC | PRN
  Filled 2022-01-23: qty 30, 30d supply, fill #0

## 2022-01-23 MED ORDER — FLUCONAZOLE 150 MG PO TABS
150.0000 mg | ORAL_TABLET | Freq: Every day | ORAL | 0 refills | Status: AC
  Filled 2022-01-23: qty 3, 3d supply, fill #0

## 2022-01-23 NOTE — Progress Notes (Signed)

## 2022-01-23 NOTE — Progress Notes (Signed)
I have spent 5 minutes in review of e-visit questionnaire, review and updating patient chart, medical decision making and response to patient.   Atari Novick Cody Khushi Zupko, PA-C    

## 2022-02-13 ENCOUNTER — Other Ambulatory Visit (HOSPITAL_BASED_OUTPATIENT_CLINIC_OR_DEPARTMENT_OTHER): Payer: Self-pay

## 2022-02-13 MED ORDER — OMEPRAZOLE 40 MG PO CPDR
40.0000 mg | DELAYED_RELEASE_CAPSULE | Freq: Every day | ORAL | 3 refills | Status: DC
  Filled 2022-02-13: qty 30, 30d supply, fill #0
  Filled 2022-03-14: qty 30, 30d supply, fill #1
  Filled 2022-04-15: qty 30, 30d supply, fill #2
  Filled 2022-05-14: qty 30, 30d supply, fill #3

## 2022-02-13 MED ORDER — ESCITALOPRAM OXALATE 20 MG PO TABS
20.0000 mg | ORAL_TABLET | Freq: Every day | ORAL | 3 refills | Status: DC
  Filled 2022-02-13: qty 30, 30d supply, fill #0
  Filled 2022-03-14: qty 30, 30d supply, fill #1

## 2022-02-18 ENCOUNTER — Ambulatory Visit: Payer: PRIVATE HEALTH INSURANCE | Admitting: Family

## 2022-02-19 ENCOUNTER — Other Ambulatory Visit (HOSPITAL_BASED_OUTPATIENT_CLINIC_OR_DEPARTMENT_OTHER): Payer: Self-pay

## 2022-02-24 ENCOUNTER — Other Ambulatory Visit (HOSPITAL_BASED_OUTPATIENT_CLINIC_OR_DEPARTMENT_OTHER): Payer: Self-pay

## 2022-02-25 ENCOUNTER — Encounter: Payer: Self-pay | Admitting: Family

## 2022-02-25 ENCOUNTER — Ambulatory Visit: Payer: 59 | Admitting: Family

## 2022-02-25 VITALS — BP 112/72 | HR 82 | Temp 98.3°F | Ht 66.0 in | Wt 275.8 lb

## 2022-02-25 DIAGNOSIS — F419 Anxiety disorder, unspecified: Secondary | ICD-10-CM | POA: Diagnosis not present

## 2022-02-25 DIAGNOSIS — K769 Liver disease, unspecified: Secondary | ICD-10-CM

## 2022-02-25 DIAGNOSIS — F32A Depression, unspecified: Secondary | ICD-10-CM | POA: Diagnosis not present

## 2022-02-25 DIAGNOSIS — Z6841 Body Mass Index (BMI) 40.0 and over, adult: Secondary | ICD-10-CM

## 2022-02-25 DIAGNOSIS — R7989 Other specified abnormal findings of blood chemistry: Secondary | ICD-10-CM | POA: Diagnosis not present

## 2022-02-25 LAB — COMPREHENSIVE METABOLIC PANEL
ALT: 47 U/L — ABNORMAL HIGH (ref 0–35)
AST: 42 U/L — ABNORMAL HIGH (ref 0–37)
Albumin: 4.3 g/dL (ref 3.5–5.2)
Alkaline Phosphatase: 190 U/L — ABNORMAL HIGH (ref 39–117)
BUN: 16 mg/dL (ref 6–23)
CO2: 27 mEq/L (ref 19–32)
Calcium: 9.5 mg/dL (ref 8.4–10.5)
Chloride: 105 mEq/L (ref 96–112)
Creatinine, Ser: 0.91 mg/dL (ref 0.40–1.20)
GFR: 75.81 mL/min (ref >60.00)
Glucose, Bld: 92 mg/dL (ref 70–99)
Potassium: 4.6 mEq/L (ref 3.5–5.1)
Sodium: 140 mEq/L (ref 135–145)
Total Bilirubin: 0.5 mg/dL (ref 0.2–1.2)
Total Protein: 7.4 g/dL (ref 6.0–8.3)

## 2022-02-25 LAB — CBC WITH DIFFERENTIAL/PLATELET
Basophils Absolute: 0 10*3/uL (ref 0.0–0.1)
Basophils Relative: 0.6 % (ref 0.0–3.0)
Eosinophils Absolute: 0.2 10*3/uL (ref 0.0–0.7)
Eosinophils Relative: 2.2 % (ref 0.0–5.0)
HCT: 41.6 % (ref 36.0–46.0)
Hemoglobin: 14.1 g/dL (ref 12.0–15.0)
Lymphocytes Relative: 32.2 % (ref 12.0–46.0)
Lymphs Abs: 2.4 10*3/uL (ref 0.7–4.0)
MCHC: 33.7 g/dL (ref 30.0–36.0)
MCV: 79.8 fl (ref 78.0–100.0)
Monocytes Absolute: 0.7 10*3/uL (ref 0.1–1.0)
Monocytes Relative: 9.3 % (ref 3.0–12.0)
Neutro Abs: 4.1 10*3/uL (ref 1.4–7.7)
Neutrophils Relative %: 55.7 % (ref 43.0–77.0)
Platelets: 223 10*3/uL (ref 150.0–400.0)
RBC: 5.22 Mil/uL — ABNORMAL HIGH (ref 3.87–5.11)
RDW: 14.1 % (ref 11.5–15.5)
WBC: 7.4 10*3/uL (ref 4.0–10.5)

## 2022-02-25 NOTE — Progress Notes (Signed)
?Krista Fisher is a 46 y.o. female with the following history as recorded in EpicCare:  ?Patient Active Problem List  ? Diagnosis Date Noted  ? Fibroid 09/06/2020  ? Fibroids 09/06/2020  ? Bariatric surgery status 01/17/2020  ? Excessive daytime sleepiness 01/17/2020  ? Hypersomnia with sleep apnea 01/17/2020  ? Loud snoring 01/17/2020  ? Morbid obesity with body mass index of 50 or higher ([place]) 01/17/2020  ? Arm numbness 03/08/2018  ? EDEMA 02/04/2011  ? CHEST PAIN-UNSPECIFIED 02/04/2011  ? VITAMIN D DEFICIENCY 12/03/2009  ? EAR PAIN, BILATERAL 12/03/2009  ? ALLERGIC RHINITIS 12/03/2009  ? Morbid obesity ([place]) 10/31/2009  ? DEPRESSION 10/31/2009  ? HYPERTENSION, BENIGN ESSENTIAL 10/31/2009  ? BLOCK, OTHER HEART 10/31/2009  ? URI 10/31/2009  ? GERD 10/31/2009  ? SLEEP APNEA 10/31/2009  ? PALPITATIONS 10/31/2009  ? 1st degree AV block 10/31/2009  ?  ?Current Outpatient Medications  ?Medication Sig Dispense Refill  ? ALPRAZolam (XANAX) 0.5 MG tablet Take 1 tablet (0.5 mg total) by mouth daily as needed for nerves. 30 tablet 0  ? Calcium Carb-Cholecalciferol 600-10 MG-MCG TABS Take by mouth.    ? cetirizine (ZYRTEC) 10 MG tablet Take by mouth.    ? COLLAGEN PO Take 1 tablet by mouth daily.     ? escitalopram (LEXAPRO) 20 MG tablet Take 1 tablet (20 mg total) by mouth daily. 30 tablet 3  ? etonogestrel (NEXPLANON) 68 MG IMPL implant     ? ferrous sulfate 325 (65 FE) MG tablet Take by mouth.    ? fluticasone (FLONASE) 50 MCG/ACT nasal spray 1 spray by Both Nostrils route daily.    ? Ginger, Zingiber officinalis, (GINGER PO) Take by mouth.    ? hydrOXYzine (ATARAX) 50 MG tablet take 1 tablet (50 mg) by mouth up to 3 times per day as needed 90 tablet 2  ? magnesium 30 MG tablet Take 30 mg by mouth 2 (two) times daily.    ? Multiple Vitamins-Iron (MULTIVITAMINS WITH IRON) TABS tablet Take 1 tablet by mouth daily.    ? omeprazole (PRILOSEC) 40 MG capsule Take 1 capsule (40 mg total) by mouth daily before a meal. 30  capsule 3  ? Turmeric (QC TUMERIC COMPLEX PO) Take by mouth.    ? Vitamin A (A-25 PO) Take by mouth.    ? zinc gluconate 50 MG tablet Take 50 mg by mouth daily.    ? ?No current facility-administered medications for this visit.  ?  ?Allergies: Latex and Wellbutrin xl [bupropion]  ?Past Medical History:  ?Diagnosis Date  ? GERD (gastroesophageal reflux disease)   ? Hypertension   ?  ?Past Surgical History:  ?Procedure Laterality Date  ? CESAREAN SECTION    ? CHOLECYSTECTOMY    ? GASTRECTOMY    ? GASTRIC BYPASS    ? IR ANGIOGRAM PELVIS SELECTIVE OR SUPRASELECTIVE  09/06/2020  ? IR ANGIOGRAM SELECTIVE EACH ADDITIONAL VESSEL  09/06/2020  ? IR ANGIOGRAM SELECTIVE EACH ADDITIONAL VESSEL  09/06/2020  ? IR EMBO TUMOR ORGAN ISCHEMIA INFARCT INC GUIDE ROADMAPPING  09/06/2020  ? IR RADIOLOGIST EVAL & MGMT  07/11/2020  ? IR RADIOLOGIST EVAL & MGMT  09/19/2020  ? IR RADIOLOGIST EVAL & MGMT  05/08/2021  ? IR US GUIDE VASC ACCESS LEFT  09/06/2020  ? KNEE SURGERY    ? WRIST FRACTURE SURGERY    ?  ?Family History  ?Problem Relation Age of Onset  ? Learning disabilities Father   ? Hypertension Father   ? Hyperlipidemia Father   ?  Heart disease Father   ? Heart failure Father   ? Diabetes Father   ? Learning disabilities Son   ? Depression Son   ? Depression Paternal Uncle   ? Drug abuse Paternal Uncle   ? Early death Paternal Uncle   ? Diabetes Maternal Grandmother   ? Intellectual disability Maternal Grandmother   ? Hypertension Maternal Grandmother   ? Hyperlipidemia Maternal Grandmother   ? Heart disease Maternal Grandmother   ? Diabetes Paternal Grandmother   ? Colon cancer Neg Hx   ? Stomach cancer Neg Hx   ? Esophageal cancer Neg Hx   ? Pancreatic cancer Neg Hx   ?  ?Social History  ? ?Tobacco Use  ? Smoking status: Never  ? Smokeless tobacco: Never  ?Substance Use Topics  ? Alcohol use: No  ?  ?Subjective:  ? ?Presents today as a new patient; had abdominal CT in 08/2021 for elevated LFTs and abdominal MRI was recommended; unfortunately  patient has not been able to get this test scheduled but is agreeable to scheduling; ? ?She is working with a Social worker for her anxiety/ PTSD; unfortunately, she is going through a very difficult divorce; would be open to meeting with psychiatrist to discuss medication management;  ? ?History of bariatric surgery- has had history of gastric sleeve and revision with duodenal switch in 2021; would like to discuss possibility of starting phentermine; admits she is not exercising regularly or eating like she should due to personal stressors;  ? ? ? ? ?Objective:  ?Vitals:  ? 02/25/22 1315  ?BP: 112/72  ?Pulse: 82  ?Temp: 98.3 ?F (36.8 ?C)  ?TempSrc: Oral  ?SpO2: 98%  ?Weight: 275 lb 12.8 oz (125.1 kg)  ?Height: _0  (1.676 m)  ?  ?General: Well developed, well nourished, in no acute distress  ?Skin : Warm and dry.  ?Head: Normocephalic and atraumatic  ?Lungs: Respirations unlabored; clear to auscultation bilaterally without wheeze, rales, rhonchi  ?CVS exam: normal rate and regular rhythm.  ?Neurologic: Alert and oriented; speech intact; face symmetrical; moves all extremities well; CNII-XII intact without focal deficit  ? ?Assessment:  ?1. Elevated LFTs   ?2. Liver disease   ?3. Anxiety and depression   ?4. BMI 40.0-44.9, adult ([place])   ?  ?Plan:  ?& 2. Update labs today including CBC, CMP, hepatitis panel; will update abdominal MRI; follow up to be determined; ?3.   Refer to psychiatrist for medication evaluation; continue with her counselor; ?4.   Encouraged patient to try and work on her lifestyle including diet/ exercise; will not be able to prescribe Phentermine; prefer not to write any medication until clarification regarding liver lesion is resolved;  ? ?This visit occurred during the SARS-CoV-2 public health emergency.  Safety protocols were in place, including screening questions prior to the visit, additional usage of staff PPE, and extensive cleaning of exam room while observing appropriate contact time as  indicated for disinfecting solutions.  ? ? ?No follow-ups on file.  ?Orders Placed This Encounter  ?Procedures  ? MR Abdomen W Wo Contrast  ?  Standing Status:   Future  ?  Standing Expiration Date:   02/26/2023  ?  Order Specific Question:   If indicated for the ordered procedure, I authorize the administration of contrast media per Radiology protocol  ?  Answer:   Yes  ?  Order Specific Question:   What is the patient's sedation requirement?  ?  Answer:   No Sedation  ?  Order Specific  Question:   Does the patient have a pacemaker or implanted devices?  ?  Answer:   No  ?  Order Specific Question:   Preferred imaging location?  ?  Answer:   [facility] (table limit-550lbs)  ? CBC with Differential/Platelet  ? Comp Met (CMET)  ? Hepatitis panel, acute  ? Ambulatory referral to Psychiatry  ?  Referral Priority:   Routine  ?  Referral Type:   Psychiatric  ?  Referral Reason:   Specialty Services Required  ?  Requested Specialty:   Psychiatry  ?  Number of Visits Requested:   1  ?  ?Requested Prescriptions  ? ? No prescriptions requested or ordered in this encounter  ?  ? ?

## 2022-02-25 NOTE — Progress Notes (Signed)
Abstracted. Colonoscopy.  ?

## 2022-02-26 LAB — HEPATITIS PANEL, ACUTE
Hep A IgM: NONREACTIVE
Hep B C IgM: NONREACTIVE
Hepatitis B Surface Ag: NONREACTIVE
Hepatitis C Ab: NONREACTIVE
SIGNAL TO CUT-OFF: 0.02 (ref <1.00)

## 2022-03-13 ENCOUNTER — Other Ambulatory Visit: Payer: Self-pay

## 2022-03-13 ENCOUNTER — Encounter: Payer: Self-pay | Admitting: Nurse Practitioner

## 2022-03-13 ENCOUNTER — Ambulatory Visit (INDEPENDENT_AMBULATORY_CARE_PROVIDER_SITE_OTHER): Payer: 59 | Admitting: Nurse Practitioner

## 2022-03-13 VITALS — BP 120/88 | HR 95 | Temp 97.7°F | Ht 66.0 in | Wt 275.8 lb

## 2022-03-13 DIAGNOSIS — R002 Palpitations: Secondary | ICD-10-CM

## 2022-03-13 DIAGNOSIS — E349 Endocrine disorder, unspecified: Secondary | ICD-10-CM | POA: Diagnosis not present

## 2022-03-13 LAB — CBC
HCT: 40.7 % (ref 36.0–46.0)
Hemoglobin: 13.7 g/dL (ref 12.0–15.0)
MCHC: 33.6 g/dL (ref 30.0–36.0)
MCV: 78.7 fl (ref 78.0–100.0)
Platelets: 210 10*3/uL (ref 150.0–400.0)
RBC: 5.17 Mil/uL — ABNORMAL HIGH (ref 3.87–5.11)
RDW: 14.3 % (ref 11.5–15.5)
WBC: 6 10*3/uL (ref 4.0–10.5)

## 2022-03-13 LAB — TSH: TSH: 2.59 u[IU]/mL (ref 0.35–5.50)

## 2022-03-13 LAB — COMPREHENSIVE METABOLIC PANEL
ALT: 76 U/L — ABNORMAL HIGH (ref 0–35)
AST: 80 U/L — ABNORMAL HIGH (ref 0–37)
Albumin: 4.2 g/dL (ref 3.5–5.2)
Alkaline Phosphatase: 188 U/L — ABNORMAL HIGH (ref 39–117)
BUN: 9 mg/dL (ref 6–23)
CO2: 28 mEq/L (ref 19–32)
Calcium: 9.1 mg/dL (ref 8.4–10.5)
Chloride: 108 mEq/L (ref 96–112)
Creatinine, Ser: 0.84 mg/dL (ref 0.40–1.20)
GFR: 83.42 mL/min (ref >60.00)
Glucose, Bld: 90 mg/dL (ref 70–99)
Potassium: 4.5 mEq/L (ref 3.5–5.1)
Sodium: 141 mEq/L (ref 135–145)
Total Bilirubin: 0.4 mg/dL (ref 0.2–1.2)
Total Protein: 7.1 g/dL (ref 6.0–8.3)

## 2022-03-13 NOTE — Progress Notes (Signed)
EKG interpreted by me on 03/13/2022 showed normal sinus rhythm with first-degree AV block, heart rate 75.  No ST or T wave changes. ? ?

## 2022-03-13 NOTE — Assessment & Plan Note (Signed)
She had an episode of heart palpitations with lightheadedness yesterday while driving and then again last night.  She states this is different than her normal palpitations as she has never had the lightheadedness associated with it before.  EKG done which showed sinus rhythm with first-degree AV block, heart rate 75.  There are no ST or T wave changes.  EKG is similar to prior EKG done in 2018.  We will check CMP, CBC, TSH today.  Encouraged her to drink plenty of water over the next few days.  Orthostatic vital signs done and were negative.  Palpitations could be associated with anxiety since the Xanax had helped her symptoms last night.  If this occurs again, we will refer to cardiology for evaluation.  Follow-up if symptoms worsen or with any concerns. ?

## 2022-03-13 NOTE — Progress Notes (Signed)
? ?Acute Office Visit ? ?Subjective:  ? ? Patient ID: Krista Fisher, female    DOB: September 19, 1976, 46 y.o.   MRN: [id number] ? ?Chief Complaint  ?Patient presents with  ? Loss of Consciousness  ?  Pt c/o near syncope episode x2 days w/ heart flutters possibly due to 1st degree AV blockage. Pt states felt some SOB last night w/ irregular HR.   ? ? ?HPI ?Patient is in today for palpitations with lightheadedness x2 episodes yesterday and last night. ? ?She has history of 1 degree AV block with some palpitations that last a few seconds and go away.  Yesterday she had some palpitations while she was driving, then she felt her heart squeezing, and then she felt lightheaded.  She felt like she might almost pass out.  The episode passed on its own.  Then last night when she was laying in bed it happened again, caused her to jump out of bed, and she felt a sense of impending doom.  This was associated with more palpitations and lightheadedness.  She took a Xanax because she does have a history of anxiety and she states that this calmed her down and the palpitations and lightheadedness went away. ? ?She has been having some increased stress at home recently.  She denies any shortness of breath with these episodes.  She states that she does not drink a lot of water.  She does drink 1 cup of coffee a day, no other caffeine. ? ?Then yesterday, she had palpitations, felt heart squeeizng, light-headed. Then last night she woke up, felt a feeling of impending doom, palpitations.  She went to cardiology in 2012 and has not needed follow-up since then.  She has not had any of these episodes today. ? ?She also states that she has a history of elevated PTH.  She would like this repeated with her lab check today. ? ?Past Medical History:  ?Diagnosis Date  ? GERD (gastroesophageal reflux disease)   ? Hypertension   ? ? ?Past Surgical History:  ?Procedure Laterality Date  ? CESAREAN SECTION    ? CHOLECYSTECTOMY    ? GASTRECTOMY    ?  GASTRIC BYPASS    ? IR ANGIOGRAM PELVIS SELECTIVE OR SUPRASELECTIVE  09/06/2020  ? IR ANGIOGRAM SELECTIVE EACH ADDITIONAL VESSEL  09/06/2020  ? IR ANGIOGRAM SELECTIVE EACH ADDITIONAL VESSEL  09/06/2020  ? IR EMBO TUMOR ORGAN ISCHEMIA INFARCT INC GUIDE ROADMAPPING  09/06/2020  ? IR RADIOLOGIST EVAL & MGMT  07/11/2020  ? IR RADIOLOGIST EVAL & MGMT  09/19/2020  ? IR RADIOLOGIST EVAL & MGMT  05/08/2021  ? IR US GUIDE VASC ACCESS LEFT  09/06/2020  ? KNEE SURGERY    ? WRIST FRACTURE SURGERY    ? ? ?Family History  ?Problem Relation Age of Onset  ? Learning disabilities Father   ? Hypertension Father   ? Hyperlipidemia Father   ? Heart disease Father   ? Heart failure Father   ? Diabetes Father   ? Learning disabilities Son   ? Depression Son   ? Depression Paternal Uncle   ? Drug abuse Paternal Uncle   ? Early death Paternal Uncle   ? Diabetes Maternal Grandmother   ? Intellectual disability Maternal Grandmother   ? Hypertension Maternal Grandmother   ? Hyperlipidemia Maternal Grandmother   ? Heart disease Maternal Grandmother   ? Diabetes Paternal Grandmother   ? Colon cancer Neg Hx   ? Stomach cancer Neg Hx   ? Esophageal cancer Neg  Hx   ? Pancreatic cancer Neg Hx   ? ? ?Social History  ? ?Socioeconomic History  ? Marital status: Married  ?  Spouse name: Not on file  ? Number of children: Not on file  ? Years of education: Not on file  ? Highest education level: Not on file  ?Occupational History  ? Not on file  ?Tobacco Use  ? Smoking status: Never  ? Smokeless tobacco: Never  ?Vaping Use  ? Vaping Use: Never used  ?Substance and Sexual Activity  ? Alcohol use: No  ? Drug use: No  ? Sexual activity: Not on file  ?Other Topics Concern  ? Not on file  ?Social History Narrative  ? Not on file  ? ?Social Determinants of Health  ? ?Financial Resource Strain: Not on file  ?Food Insecurity: Not on file  ?Transportation Needs: Not on file  ?Physical Activity: Not on file  ?Stress: Not on file  ?Social Connections: Not on file   ?Intimate Partner Violence: Not on file  ? ? ?Outpatient Medications Prior to Visit  ?Medication Sig Dispense Refill  ? ALPRAZolam (XANAX) 0.5 MG tablet Take 1 tablet (0.5 mg total) by mouth daily as needed for nerves. 30 tablet 0  ? Calcium Carb-Cholecalciferol 600-10 MG-MCG TABS Take by mouth.    ? cetirizine (ZYRTEC) 10 MG tablet Take by mouth.    ? COLLAGEN PO Take 1 tablet by mouth daily.     ? escitalopram (LEXAPRO) 20 MG tablet Take 1 tablet (20 mg total) by mouth daily. 30 tablet 3  ? etonogestrel (NEXPLANON) 68 MG IMPL implant     ? ferrous sulfate 325 (65 FE) MG tablet Take by mouth.    ? fluticasone (FLONASE) 50 MCG/ACT nasal spray 1 spray by Both Nostrils route daily.    ? Ginger, Zingiber officinalis, (GINGER PO) Take by mouth.    ? hydrOXYzine (ATARAX) 50 MG tablet take 1 tablet (50 mg) by mouth up to 3 times per day as needed 90 tablet 2  ? magnesium 30 MG tablet Take 30 mg by mouth 2 (two) times daily.    ? Multiple Vitamins-Iron (MULTIVITAMINS WITH IRON) TABS tablet Take 1 tablet by mouth daily.    ? omeprazole (PRILOSEC) 40 MG capsule Take 1 capsule (40 mg total) by mouth daily before a meal. 30 capsule 3  ? Turmeric (QC TUMERIC COMPLEX PO) Take by mouth.    ? Vitamin A (A-25 PO) Take by mouth.    ? zinc gluconate 50 MG tablet Take 50 mg by mouth daily.    ? ?No facility-administered medications prior to visit.  ? ? ?Allergies  ?Allergen Reactions  ? Latex   ? Wellbutrin Xl [Bupropion]   ?  Abdominal pain  ? ? ?Review of Systems ?See pertinent positives and negatives per HPI. ?   ?Objective:  ?  ?Physical Exam ?Vitals and nursing note reviewed.  ?Constitutional:   ?   General: She is not in acute distress. ?   Appearance: Normal appearance.  ?HENT:  ?   Head: Normocephalic.  ?Eyes:  ?   Conjunctiva/sclera: Conjunctivae normal.  ?Cardiovascular:  ?   Rate and Rhythm: Normal rate and regular rhythm.  ?   Pulses: Normal pulses.  ?   Heart sounds: Normal heart sounds.  ?Pulmonary:  ?   Effort:  Pulmonary effort is normal.  ?   Breath sounds: Normal breath sounds.  ?Musculoskeletal:  ?   Cervical back: Normal range of motion and neck  supple. No tenderness.  ?Lymphadenopathy:  ?   Cervical: No cervical adenopathy.  ?Skin: ?   General: Skin is warm.  ?Neurological:  ?   General: No focal deficit present.  ?   Mental Status: She is alert and oriented to person, place, and time.  ?Psychiatric:     ?   Mood and Affect: Mood normal.     ?   Behavior: Behavior normal.     ?   Thought Content: Thought content normal.     ?   Judgment: Judgment normal.  ? ? ?BP 120/88 (BP Location: Left Arm, Patient Position: Sitting, Cuff Size: Large)   Pulse 95   Temp 97.7 ?F (36.5 ?C) (Temporal)   Ht '5\' 6"'$  (1.676 m)   Wt 275 lb 12.8 oz (125.1 kg)   SpO2 96%   BMI 44.52 kg/m?  ?Wt Readings from Last 3 Encounters:  ?03/13/22 275 lb 12.8 oz (125.1 kg)  ?02/25/22 275 lb 12.8 oz (125.1 kg)  ?09/14/21 245 lb (111.1 kg)  ? ?Orthostatic VS for the past 72 hrs (Last 3 readings): ? Orthostatic BP Patient Position BP Location Cuff Size Orthostatic Pulse  ?03/13/22 1150 116/80 Standing Right Arm Large 88  ?03/13/22 1147 110/84 Sitting Right Arm -- 78  ?03/13/22 1145 98/76 Supine Right Arm Large 74  ?03/13/22 1104 -- Sitting Left Arm Large --  ? ? ? ?Health Maintenance Due  ?Topic Date Due  ? HIV Screening  Never done  ? PAP SMEAR-Modifier  02/04/2015  ? COVID-19 Vaccine (4 - Booster) 12/25/2020  ? ? ?There are no preventive care reminders to display for this patient. ? ? ?Lab Results  ?Component Value Date  ? TSH 3.730 05/06/2018  ? ?Lab Results  ?Component Value Date  ? WBC 7.4 02/25/2022  ? HGB 14.1 02/25/2022  ? HCT 41.6 02/25/2022  ? MCV 79.8 02/25/2022  ? PLT 223.0 02/25/2022  ? ?Lab Results  ?Component Value Date  ? NA 140 02/25/2022  ? K 4.6 02/25/2022  ? CO2 27 02/25/2022  ? GLUCOSE 92 02/25/2022  ? BUN 16 02/25/2022  ? CREATININE 0.91 02/25/2022  ? BILITOT 0.5 02/25/2022  ? ALKPHOS 190 (H) 02/25/2022  ? AST 42 (H) 02/25/2022   ? ALT 47 (H) 02/25/2022  ? PROT 7.4 02/25/2022  ? ALBUMIN 4.3 02/25/2022  ? CALCIUM 9.5 02/25/2022  ? ANIONGAP 3 (L) 09/14/2021  ? GFR 75.81 02/25/2022  ? ?Lab Results  ?Component Value Date  ? CHOL 145 10/31/2009  ? ?Lab Resul

## 2022-03-13 NOTE — Patient Instructions (Signed)
It was great to see you! ? ?We are checking your labs today and will send the results to [email or web address] if they are not viewed. Increase the amount of water you are drinking. Please reach out if this episode happens again and we will refer you to cardiology.  ? ?Let's follow-up if your symptoms don't improve or worsen. Keep regular scheduled appointments with your primary care provider. ? ?Take care, ? ?Vance Peper, NP ? ?

## 2022-03-14 ENCOUNTER — Other Ambulatory Visit: Payer: Self-pay | Admitting: Family

## 2022-03-14 ENCOUNTER — Other Ambulatory Visit (HOSPITAL_BASED_OUTPATIENT_CLINIC_OR_DEPARTMENT_OTHER): Payer: Self-pay

## 2022-03-14 ENCOUNTER — Encounter: Payer: Self-pay | Admitting: Family

## 2022-03-14 LAB — PTH, INTACT AND CALCIUM
Calcium: 9 mg/dL (ref 8.6–10.2)
PTH: 115 pg/mL — ABNORMAL HIGH (ref 16–77)

## 2022-03-14 MED ORDER — ESCITALOPRAM OXALATE 20 MG PO TABS
20.0000 mg | ORAL_TABLET | Freq: Every day | ORAL | 0 refills | Status: DC
  Filled 2022-03-17: qty 90, 90d supply, fill #0

## 2022-03-17 ENCOUNTER — Other Ambulatory Visit (HOSPITAL_BASED_OUTPATIENT_CLINIC_OR_DEPARTMENT_OTHER): Payer: Self-pay

## 2022-03-18 ENCOUNTER — Other Ambulatory Visit: Payer: Self-pay | Admitting: Family

## 2022-03-18 ENCOUNTER — Other Ambulatory Visit (HOSPITAL_BASED_OUTPATIENT_CLINIC_OR_DEPARTMENT_OTHER): Payer: Self-pay

## 2022-03-18 MED ORDER — ALPRAZOLAM 0.5 MG PO TABS
0.5000 mg | ORAL_TABLET | ORAL | 0 refills | Status: DC | PRN
  Filled 2022-03-18: qty 30, 30d supply, fill #0

## 2022-03-23 ENCOUNTER — Other Ambulatory Visit: Payer: 59

## 2022-04-15 ENCOUNTER — Other Ambulatory Visit: Payer: 59

## 2022-04-15 ENCOUNTER — Other Ambulatory Visit (HOSPITAL_COMMUNITY): Payer: Self-pay

## 2022-04-16 ENCOUNTER — Ambulatory Visit (INDEPENDENT_AMBULATORY_CARE_PROVIDER_SITE_OTHER): Payer: 59 | Admitting: Family Medicine

## 2022-04-16 ENCOUNTER — Encounter: Payer: Self-pay | Admitting: Family Medicine

## 2022-04-16 VITALS — BP 112/78 | HR 85 | Temp 97.7°F | Ht 66.0 in

## 2022-04-16 DIAGNOSIS — H6122 Impacted cerumen, left ear: Secondary | ICD-10-CM

## 2022-04-16 DIAGNOSIS — R0982 Postnasal drip: Secondary | ICD-10-CM

## 2022-04-16 DIAGNOSIS — J029 Acute pharyngitis, unspecified: Secondary | ICD-10-CM | POA: Diagnosis not present

## 2022-04-16 LAB — POCT RAPID STREP A (OFFICE): Rapid Strep A Screen: NEGATIVE

## 2022-04-16 NOTE — Progress Notes (Signed)
Patient consent obtained. Irrigation with water and peroxide performed. Full view of tympanic membranes after procedure.  Patient tolerated procedure well.   

## 2022-04-16 NOTE — Progress Notes (Signed)
Subjective: ? Krista Fisher is a 46 y.o. female who presents for evaluation of sore throat.  She has not had a recent close exposure to someone with proven streptococcal pharyngitis.  Associated symptoms include ?Nasal congestion and post nasal drainage. Right ear pain, head pressure, tender lymph nodes. Mild cough.  ? ?No hx of strep throat.  ? ?Taking Mucinex DM. Tylenol.  ? ?No other aggravating or relieving factors.  No other c/o. ? ?The following portions of the patient's history were reviewed and updated as appropriate: allergies, current medications, past medical history, past social history, past surgical history and problem list. ? ?ROS as in subjective ?  ?Objective: ?Vitals:  ? 04/16/22 0934  ?BP: 112/78  ?Pulse: 85  ?Temp: 97.7 ?F (36.5 ?C)  ?SpO2: 97%  ? ? ?General appearance: no distress, WD/WN,  well-appearing ?HEENT: normocephalic, conjunctiva/corneas normal, sclerae anicteric, nares patent, no discharge or erythema, pharynx with erythema, without exudate or edema. Left ear with cerumen impaction. right ear canal and TM normal.  ?Oral cavity: MMM, no lesions  ?Neck: supple, mild right anterior cervical adenopathy with tenderness, otherwise no lymphadenopathy  ?Heart: RRR,  ?Lungs: CTA bilaterally, no wheezes, rhonchi, or rales ? ? ? ?Laboratory ?Strep test not done. Results:negative.  ?  ?Assessment and Plan: ?Acute pharyngitis, unspecified etiology ? ?Sore throat - Plan: POCT rapid strep A ? ?Left ear impacted cerumen - Plan: Ear Lavage ? ?Post-nasal drainage ? ?Right ear lavage done by CMA Hannah. Verbal consent obtained. Patient tolerated procedure well. Reported improvement post lavage. TM normal appearing post lavage.  ?Advised that symptoms and exam suggest a viral etiology.  Discussed symptomatic treatment including salt water gargles, warm fluids, rest, hydrate well, can use over-the-counter Tylenol or ibuprofen for throat pain and Mucinex DM, steroid nasal spray if needed.  If worse or not  improving within 2-3 days, call or return. ? ?

## 2022-04-16 NOTE — Patient Instructions (Signed)
At this point your symptoms appear to be related to a viral illness. ? ?I recommend Tylenol 1000 mg twice daily or ibuprofen 800 mg 3 times per day with food for the next 2 to 3 days.  You can also alternate between Tylenol and ibuprofen. ? ?I also recommend salt water gargles (1/2 teaspoon in 6 ounces of warm water) and Chloraseptic spray or throat lozenges. ? ?Drink plenty of warm beverages and plenty of water. ? ?You may continue with Mucinex DM and may want to consider adding a steroid nasal spray such as Flonase or Nasacort if you are having significant nasal congestion.  ? ?Follow-up if you are getting worse or if you are not noticing improvement in the next 2 to 3 days. ?

## 2022-05-02 ENCOUNTER — Other Ambulatory Visit (HOSPITAL_COMMUNITY): Payer: Self-pay

## 2022-05-02 ENCOUNTER — Encounter (HOSPITAL_COMMUNITY): Payer: Self-pay | Admitting: Psychiatry

## 2022-05-02 ENCOUNTER — Telehealth (HOSPITAL_BASED_OUTPATIENT_CLINIC_OR_DEPARTMENT_OTHER): Payer: 59 | Admitting: Psychiatry

## 2022-05-02 VITALS — Wt 285.0 lb

## 2022-05-02 DIAGNOSIS — F410 Panic disorder [episodic paroxysmal anxiety] without agoraphobia: Secondary | ICD-10-CM | POA: Diagnosis not present

## 2022-05-02 DIAGNOSIS — T1490XA Injury, unspecified, initial encounter: Secondary | ICD-10-CM

## 2022-05-02 DIAGNOSIS — F331 Major depressive disorder, recurrent, moderate: Secondary | ICD-10-CM

## 2022-05-02 MED ORDER — LAMOTRIGINE 25 MG PO TABS
ORAL_TABLET | ORAL | 1 refills | Status: DC
Start: 2022-05-02 — End: 2022-08-18
  Filled 2022-05-02 – 2022-05-15 (×2): qty 60, 30d supply, fill #0
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
## 2022-05-05 ENCOUNTER — Ambulatory Visit
Admission: RE | Admit: 2022-05-05 | Discharge: 2022-05-05 | Disposition: A | Discharge status: Home / Self Care | Payer: 59 | Source: Ambulatory Visit | Attending: Family | Admitting: Family

## 2022-05-05 DIAGNOSIS — K449 Diaphragmatic hernia without obstruction or gangrene: Secondary | ICD-10-CM | POA: Diagnosis not present

## 2022-05-05 DIAGNOSIS — K769 Liver disease, unspecified: Secondary | ICD-10-CM

## 2022-05-05 DIAGNOSIS — R932 Abnormal findings on diagnostic imaging of liver and biliary tract: Secondary | ICD-10-CM | POA: Diagnosis not present

## 2022-05-05 IMAGING — MR MR ABDOMEN WO/W CM
19 series · 48 of 48 positions shown · IV contrast (agent unspecified)
Comparison: CT abdomen and pelvis [DATE]
COMPARISON: CT abdomen and pelvis [DATE]

Addendum:
CLINICAL DATA: Liver lesion

EXAM:
MRI ABDOMEN WITHOUT AND WITH CONTRAST
TECHNIQUE: Multiplanar multisequence MR imaging of the abdomen was performed
both before and after the administration of intravenous contrast.
CONTRAST:  10 mL Vueway

[Series 3: T2 · coronal · 5.0mm · 1.56mm/px · 1 of 36 slices shown (1 of 3)]
[im 1/36]
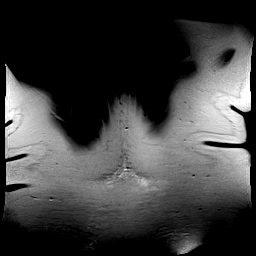

[Series 4: T1 · axial · 3.0mm · 1.19mm/px · z∈[-95,+118]mm · 4 of 144 slices shown]
[im 1/144]
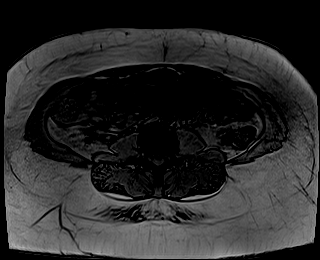
[im 48/144]
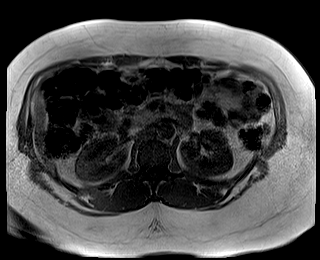
[im 96/144]
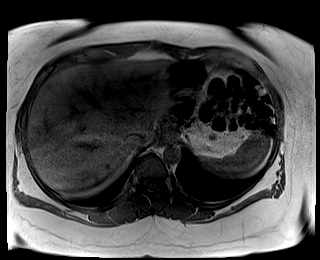
[im 144/144]
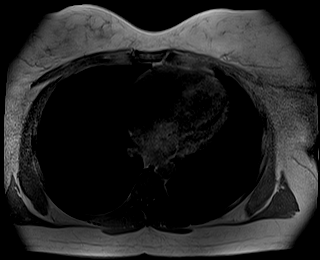

[Series 5: bSSFP · axial · 5.0mm · 1.25mm/px · 1 of 33 slices shown]
[im 1/33]
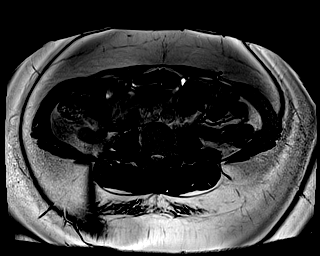

[Series 6: T2 · axial · 5.0mm · 1.48mm/px · 1 of 38 slices shown (2 of 3)]
[im 1/38]
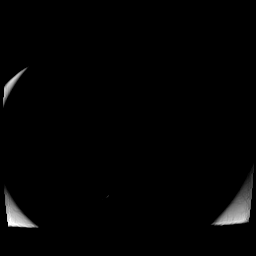

[Series 7: DWI · axial · 5.0mm · 1.42mm/px · z∈[-102,+108]mm · 3 of 108 slices shown (1 of 2)]
[im 1/108]
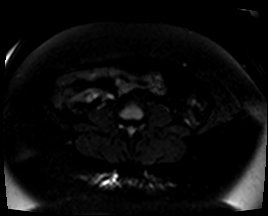
[im 54/108]
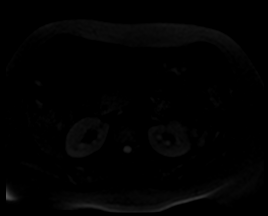
[im 108/108]
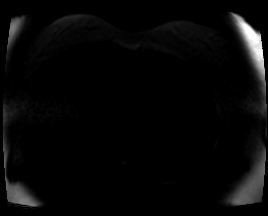

[Series 8: DWI · axial · 5.0mm · 1.42mm/px · 1 of 36 slices shown (2 of 2)]
[im 1/36]
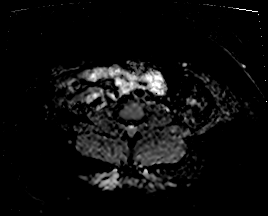

[Series 9: T2 · axial · 6.0mm · 1.19mm/px · 1 of 30 slices shown (3 of 3)]
[im 1/30]
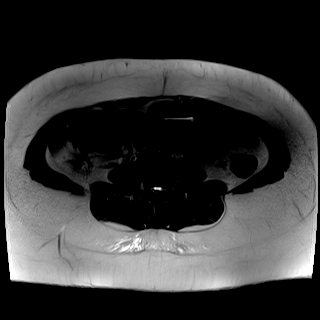

[Series 10: T1 dynamic · axial · non-contrast · 3.0mm · 1.25mm/px · z∈[-104,+109]mm · 3 of 72 slices shown]
[im 1/72]
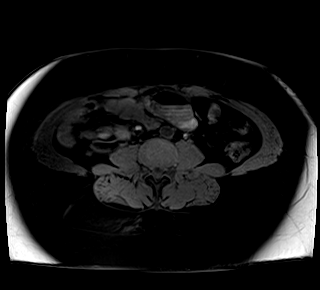
[im 36/72]
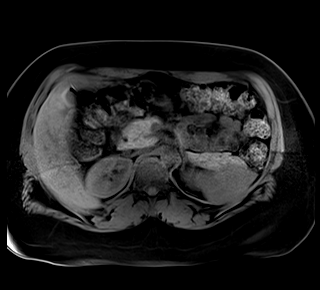
[im 72/72]
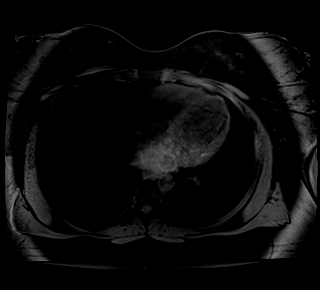

[Series 11: T1 dynamic post-contrast · axial · 3.0mm · 1.25mm/px · z∈[-104,+109]mm · 3 of 72 slices shown (1 of 11)]
[im 1/72]
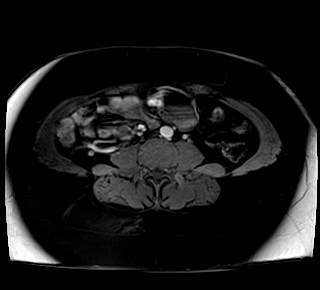
[im 36/72]
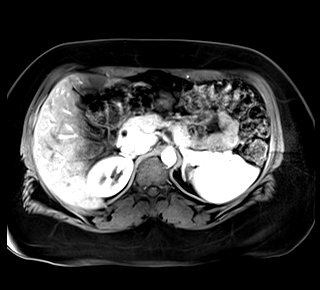
[im 72/72]
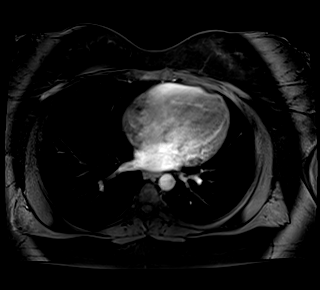

[Series 12: T1 dynamic post-contrast · axial · 3.0mm · 1.25mm/px · z∈[-104,+109]mm · 3 of 72 slices shown (2 of 11)]
[im 1/72]
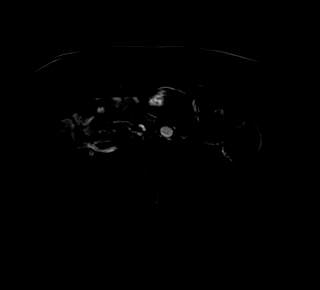
[im 36/72]
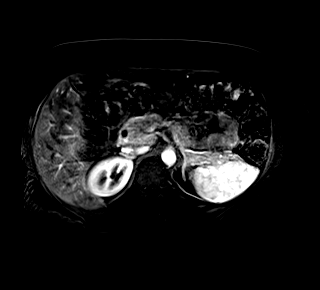
[im 72/72]
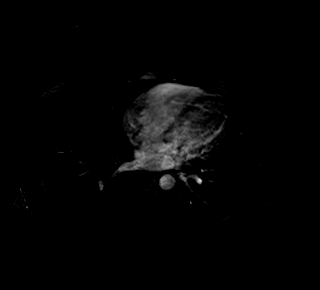

[Series 13: T1 dynamic post-contrast · axial · 3.0mm · 1.25mm/px · z∈[-104,+109]mm · 3 of 72 slices shown (3 of 11)]
[im 1/72]
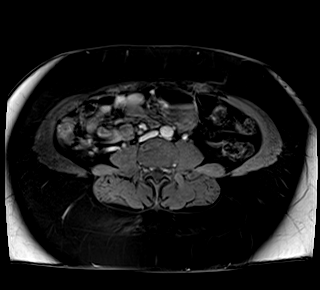
[im 36/72]
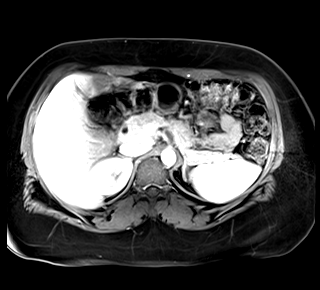
[im 72/72]
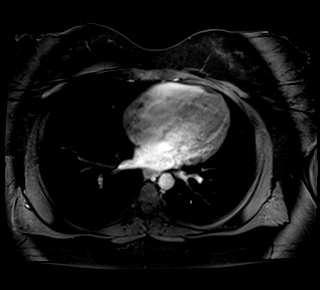

[Series 14: T1 dynamic post-contrast · axial · 3.0mm · 1.25mm/px · z∈[-104,+109]mm · 3 of 72 slices shown (4 of 11)]
[im 1/72]
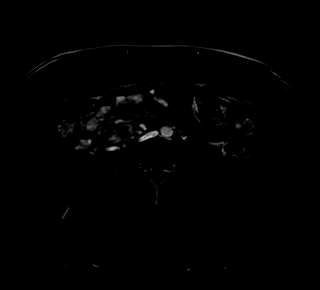
[im 36/72]
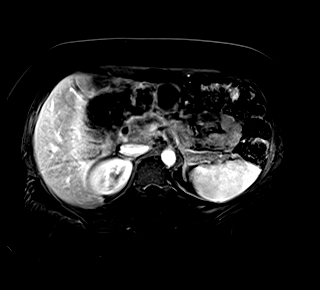
[im 72/72]
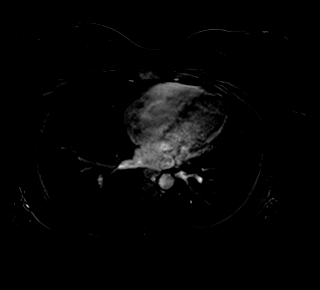

[Series 15: T1 dynamic post-contrast · axial · 3.0mm · 1.25mm/px · z∈[-104,+109]mm · 3 of 72 slices shown (5 of 11)]
[im 1/72]
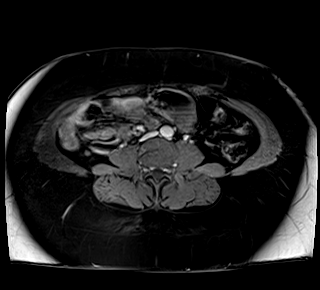
[im 36/72]
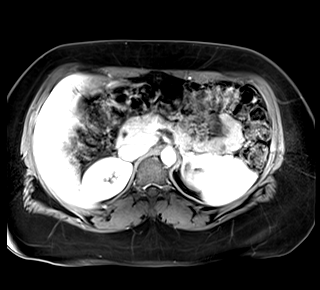
[im 72/72]
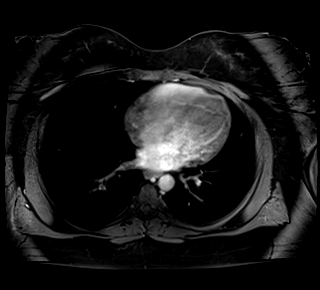

[Series 16: T1 dynamic post-contrast · axial · 3.0mm · 1.25mm/px · z∈[-104,+109]mm · 3 of 72 slices shown (6 of 11)]
[im 1/72]
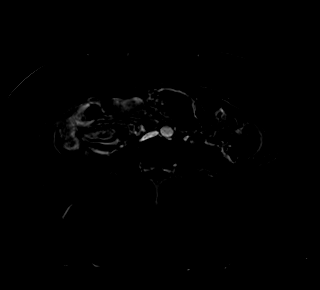
[im 36/72]
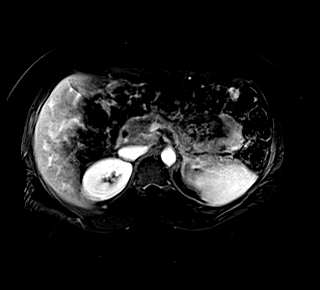
[im 72/72]
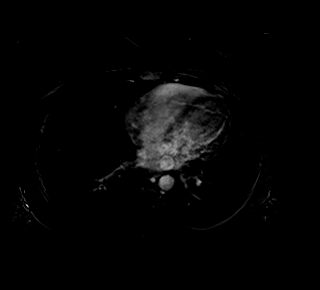

[Series 17: T1 dynamic post-contrast · coronal · 3.0mm · 1.25mm/px · 3 of 72 slices shown (7 of 11)]
[im 1/72]
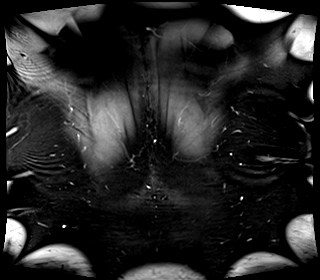
[im 36/72]
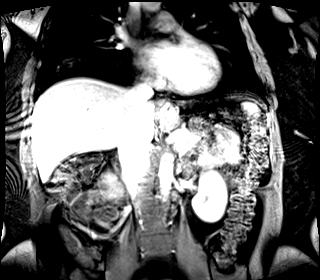
[im 72/72]
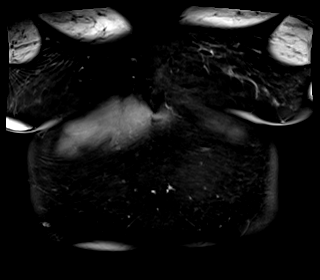

[Series 18: T1 dynamic post-contrast · axial · 3.0mm · 1.25mm/px · z∈[-104,+109]mm · 3 of 72 slices shown (8 of 11)]
[im 1/72]
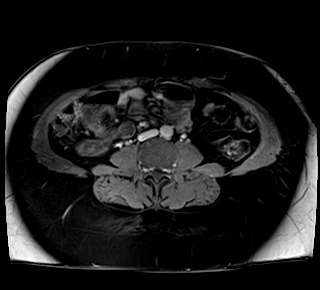
[im 36/72]
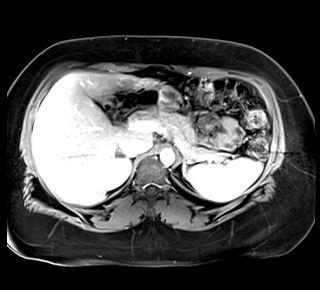
[im 72/72]
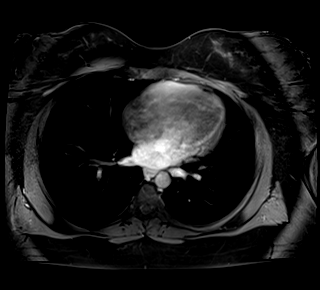

[Series 19: T1 dynamic post-contrast · axial · 3.0mm · 1.25mm/px · z∈[-104,+109]mm · 3 of 72 slices shown (9 of 11)]
[im 1/72]
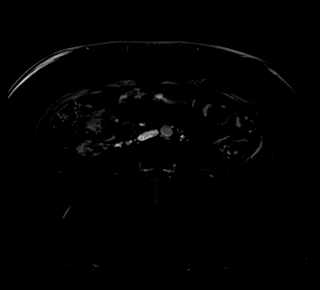
[im 36/72]
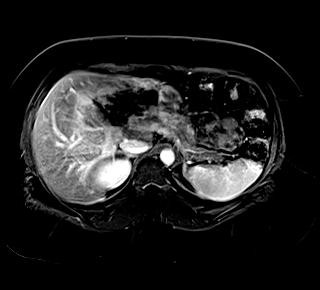
[im 72/72]
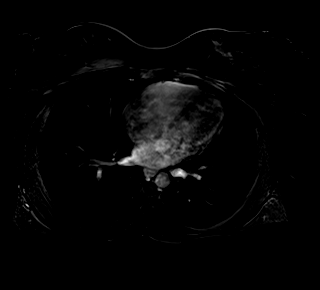

[Series 20: T1 dynamic post-contrast · axial · 3.0mm · 1.25mm/px · z∈[-104,+109]mm · 3 of 72 slices shown (10 of 11)]
[im 1/72]
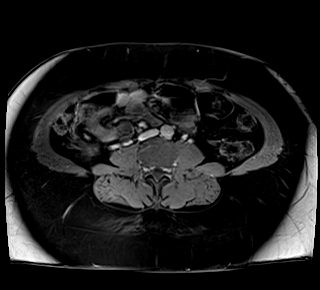
[im 36/72]
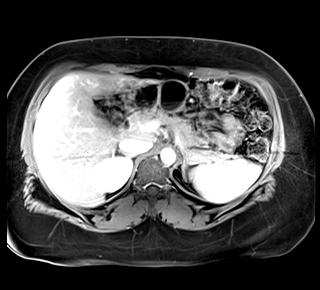
[im 72/72]
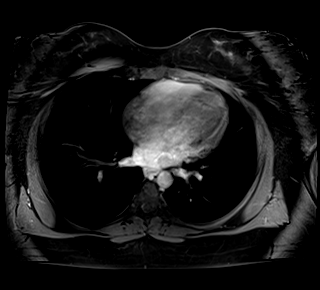

[Series 21: T1 dynamic post-contrast · axial · 3.0mm · 1.25mm/px · z∈[-104,+109]mm · 3 of 72 slices shown (11 of 11)]
[im 1/72]
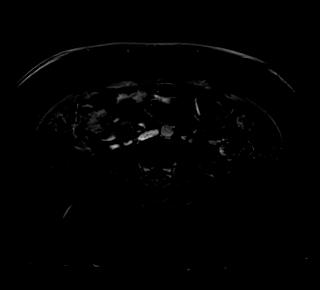
[im 36/72]
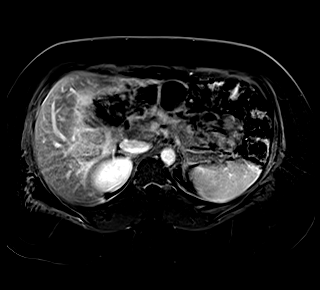
[im 72/72]
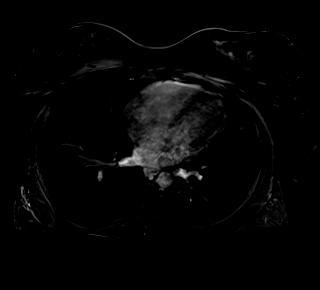

[48 of 48 positions shown; findings below may reference images not displayed]

FINDINGS: Lower chest: No acute findings.

Hepatobiliary: Liver is normal in size and contour. Approximately
2.9 x 1.4 cm area of focal fatty infiltration visualized in the
anterior left hepatic lobe, correlating with the finding on CT.
There also 2 subcentimeter foci of early arterial hyperenhancement
in the right hepatic lobe segment 4 and segment [DATE], likely
representing flash hemangiomas. Gallbladder is surgically absent. No
biliary ductal dilatation identified.

Pancreas: No mass, inflammatory changes, or other parenchymal
abnormality identified.

Spleen:  Within normal limits in size and appearance.

Adrenals/Urinary Tract: No masses identified. No evidence of
hydronephrosis.

Stomach/Bowel: Small hiatal hernia.  Evidence of bowel obstruction.

Vascular/Lymphatic: No pathologically enlarged lymph nodes
identified. No abdominal aortic aneurysm demonstrated.

Other:  No ascites.

Musculoskeletal: No suspicious bone lesions identified.
IMPRESSION: 1. Area of focal fatty infiltration at the anterior left hepatic
lobe which corresponds with the finding on CT.
[DATE] subcentimeter enhancing likely flash hemangiomas in the right
hepatic lobe.
3. Small hiatal hernia.

ADDENDUM:
Transcription error. In the stomach/bowel section it should read: NO
evidence of bowel obstruction.

*** End of Addendum ***
FINDINGS: Lower chest: No acute findings.

Hepatobiliary: Liver is normal in size and contour. Approximately
2.9 x 1.4 cm area of focal fatty infiltration visualized in the
anterior left hepatic lobe, correlating with the finding on CT.
There also 2 subcentimeter foci of early arterial hyperenhancement
in the right hepatic lobe segment 4 and segment [DATE], likely
representing flash hemangiomas. Gallbladder is surgically absent. No
biliary ductal dilatation identified.

Pancreas: No mass, inflammatory changes, or other parenchymal
abnormality identified.

Spleen:  Within normal limits in size and appearance.

Adrenals/Urinary Tract: No masses identified. No evidence of
hydronephrosis.

Stomach/Bowel: Small hiatal hernia.  Evidence of bowel obstruction.

Vascular/Lymphatic: No pathologically enlarged lymph nodes
identified. No abdominal aortic aneurysm demonstrated.

Other:  No ascites.

Musculoskeletal: No suspicious bone lesions identified.
IMPRESSION: 1. Area of focal fatty infiltration at the anterior left hepatic
lobe which corresponds with the finding on CT.
[DATE] subcentimeter enhancing likely flash hemangiomas in the right
hepatic lobe.
3. Small hiatal hernia.

## 2022-05-05 MED ORDER — GADOPICLENOL 0.5 MMOL/ML IV SOLN
10.0000 mL | Freq: Once | INTRAVENOUS | Status: AC | PRN
  Administered 2022-05-05: 10 mL via INTRAVENOUS

## 2022-05-06 ENCOUNTER — Other Ambulatory Visit: Payer: Self-pay | Admitting: Family

## 2022-05-06 ENCOUNTER — Encounter: Payer: Self-pay | Admitting: Family

## 2022-05-06 DIAGNOSIS — R7989 Other specified abnormal findings of blood chemistry: Secondary | ICD-10-CM

## 2022-05-14 ENCOUNTER — Encounter: Payer: Self-pay | Admitting: Family

## 2022-05-14 ENCOUNTER — Other Ambulatory Visit (HOSPITAL_COMMUNITY): Payer: Self-pay

## 2022-05-15 ENCOUNTER — Other Ambulatory Visit (HOSPITAL_COMMUNITY): Payer: Self-pay

## 2022-05-22 ENCOUNTER — Telehealth (HOSPITAL_COMMUNITY): Payer: Self-pay | Admitting: Psychiatry

## 2022-05-23 ENCOUNTER — Other Ambulatory Visit (HOSPITAL_COMMUNITY): Payer: Self-pay

## 2022-06-06 ENCOUNTER — Encounter (HOSPITAL_COMMUNITY): Payer: Self-pay | Admitting: Psychiatry

## 2022-06-06 ENCOUNTER — Other Ambulatory Visit (HOSPITAL_COMMUNITY): Payer: Self-pay | Admitting: Psychiatry

## 2022-06-06 ENCOUNTER — Telehealth (HOSPITAL_BASED_OUTPATIENT_CLINIC_OR_DEPARTMENT_OTHER): Payer: 59 | Admitting: Psychiatry

## 2022-06-06 ENCOUNTER — Other Ambulatory Visit (HOSPITAL_COMMUNITY): Payer: Self-pay

## 2022-06-06 VITALS — Wt 281.0 lb

## 2022-06-06 DIAGNOSIS — F410 Panic disorder [episodic paroxysmal anxiety] without agoraphobia: Secondary | ICD-10-CM

## 2022-06-06 DIAGNOSIS — T1490XA Injury, unspecified, initial encounter: Secondary | ICD-10-CM

## 2022-06-06 DIAGNOSIS — F331 Major depressive disorder, recurrent, moderate: Secondary | ICD-10-CM

## 2022-06-06 MED ORDER — ESCITALOPRAM OXALATE 20 MG PO TABS
20.0000 mg | ORAL_TABLET | Freq: Every day | ORAL | 0 refills | Status: DC
  Filled 2022-06-06: qty 90, 90d supply, fill #0

## 2022-06-06 MED ORDER — ALPRAZOLAM 0.5 MG PO TABS
ORAL_TABLET | ORAL | 0 refills | Status: DC
  Filled 2022-06-06: qty 20, fill #0
  Filled 2022-06-10: qty 20, 20d supply, fill #0

## 2022-06-06 NOTE — Progress Notes (Signed)
Virtual Visit via Video Note  I connected with Krista Fisher on 06/06/22 at 10:30 AM EDT by a video enabled telemedicine application and verified that I am speaking with the correct person using two identifiers.  Location: Patient: Work Provider: Biomedical scientist   I discussed the limitations of evaluation and management by telemedicine and the availability of in person appointments. The patient expressed understanding and agreed to proceed.  History of Present Illness: Patient is evaluated by video session.  She is a 46 year old African-American female who is having a lot of anxiety and depression.  She is going through a difficult divorce and having a hard time managing her symptoms.  We started her on Lamictal however after taking for 2 weeks she had to stop because she was having headaches, abdominal cramping, poor sleep.  However she is feeling better and has started walking every day and she is watching what she ate and trying to focus on her general health.  She had a panic attack when her ex showed up on her old office.  She has to take the Xanax to calm her down to relieve her panic attack.  Now she has a attorney who is handling all the legal issues.  She is pleased that she had lost 3 pounds from the past.  She is now living in her apartment after selling her old house.  She has occasionally crying spells but overall she is more optimistic.  Her upcoming court date is on July 20.  She is restarted therapy with employee assistance program.  Since started walking she noticed her sleep is much better.  Her energy level is improved.  She had a support from her mother, sister, brother-in-law and cousins.  Occasionally she has nightmares and flashback but she feels things are going better.  She is taking Lexapro 20 mg.  She has not taken hydroxyzine and taken few times Xanax.  Due to high liver enzymes she has recently MRI and she has upcoming appointment to have her result discussed.  She is working as  a Buyer, retail at [employer].  She is also doing masters on line and hoping to finish her school in fall 2024.  Past Psychiatric History: H/O depression, anxiety and childhood trauma. Saw psychiatrist once but most of the time medicines were given by PCP. Prozac caused nightmares and Celexa caused increased anxiety.  Wellbutrin but Paxil help the most until stopped on her own.  No history of suicidal attempt.  No history of psychiatric inpatient treatment.     Psychiatric Specialty Exam: Physical Exam  Review of Systems  Weight 281 lb (127.5 kg).There is no height or weight on file to calculate BMI.  General Appearance: Casual  Eye Contact:  Good  Speech:  Clear and Coherent and Normal Rate  Volume:  Normal  Mood:  Anxious  Affect:  Appropriate  Thought Process:  Goal Directed  Orientation:  Full (Time, Place, and Person)  Thought Content:  Logical  Suicidal Thoughts:  No  Homicidal Thoughts:  No  Memory:  Immediate;   Good Recent;   Good Remote;   Good  Judgement:  Intact  Insight:  Present  Psychomotor Activity:  Normal  Concentration:  Concentration: Good and Attention Span: Good  Recall:  Good  Fund of Knowledge:  Good  Language:  Good  Akathisia:  No  Handed:  Right  AIMS (if indicated):     Assets:  Communication Skills Desire for [employer] Talents/Skills  ADL's:  Intact  Cognition:  WNL  Sleep:   better      Assessment and Plan: PTSD.  Major depressive disorder, recurrent.  Panic attacks.  We will discontinue Lamictal since patient started to having side effects.  She is doing much better since she started changes in her lifestyle and now have attorney to deal with her legal issues.  She lost weight.  She like to keep the Lexapro and Xanax to take as needed.  She has not taken hydroxyzine since the last visit.  I encouraged to continue therapy with employee assistance program.  Continue Lexapro 20 mg daily and  Xanax take as needed for severe anxiety.  Discussed medication side effects and benefits.  Recommended to call us back if she has any question or any concern.  Follow-up in 2 months.  Follow Up Instructions:    I discussed the assessment and treatment plan with the patient. The patient was provided an opportunity to ask questions and all were answered. The patient agreed with the plan and demonstrated an understanding of the instructions.   The patient was advised to call back or seek an in-person evaluation if the symptoms worsen or if the condition fails to improve as anticipated.  Collaboration of Care: Primary Care Provider AEB notes are available in epic to review.  Patient/Guardian was advised Release of Information must be obtained prior to any record release in order to collaborate their care with an outside provider. Patient/Guardian was advised if they have not already done so to contact the registration department to sign all necessary forms in order for [place] to release information regarding their care.   Consent: Patient/Guardian gives verbal consent for treatment and assignment of benefits for services provided during this visit. Patient/Guardian expressed understanding and agreed to proceed.    I provided 25 minutes of non-face-to-face time during this encounter.   Kathlee Nations, MD

## 2022-06-10 ENCOUNTER — Other Ambulatory Visit (HOSPITAL_COMMUNITY): Payer: Self-pay

## 2022-06-11 ENCOUNTER — Other Ambulatory Visit (HOSPITAL_COMMUNITY): Payer: Self-pay

## 2022-06-12 ENCOUNTER — Encounter: Payer: Self-pay | Admitting: Family

## 2022-06-12 DIAGNOSIS — K219 Gastro-esophageal reflux disease without esophagitis: Secondary | ICD-10-CM

## 2022-06-13 ENCOUNTER — Other Ambulatory Visit (HOSPITAL_COMMUNITY): Payer: Self-pay

## 2022-06-13 MED ORDER — OMEPRAZOLE 40 MG PO CPDR
40.0000 mg | DELAYED_RELEASE_CAPSULE | Freq: Every day | ORAL | 3 refills | Status: DC
  Filled 2022-06-13: qty 30, 30d supply, fill #0
  Filled 2022-07-13: qty 30, 30d supply, fill #1
  Filled 2022-08-15: qty 30, 30d supply, fill #2

## 2022-07-14 ENCOUNTER — Other Ambulatory Visit (HOSPITAL_COMMUNITY): Payer: Self-pay

## 2022-07-16 ENCOUNTER — Other Ambulatory Visit (HOSPITAL_COMMUNITY): Payer: Self-pay

## 2022-08-06 ENCOUNTER — Encounter (HOSPITAL_COMMUNITY): Payer: Self-pay | Admitting: Psychiatry

## 2022-08-06 ENCOUNTER — Other Ambulatory Visit (HOSPITAL_COMMUNITY): Payer: Self-pay

## 2022-08-06 ENCOUNTER — Telehealth (HOSPITAL_BASED_OUTPATIENT_CLINIC_OR_DEPARTMENT_OTHER): Payer: 59 | Admitting: Psychiatry

## 2022-08-06 DIAGNOSIS — F331 Major depressive disorder, recurrent, moderate: Secondary | ICD-10-CM

## 2022-08-06 DIAGNOSIS — F410 Panic disorder [episodic paroxysmal anxiety] without agoraphobia: Secondary | ICD-10-CM | POA: Diagnosis not present

## 2022-08-06 DIAGNOSIS — T1490XA Injury, unspecified, initial encounter: Secondary | ICD-10-CM

## 2022-08-06 MED ORDER — ESCITALOPRAM OXALATE 20 MG PO TABS
20.0000 mg | ORAL_TABLET | Freq: Every day | ORAL | 0 refills | Status: DC
  Filled 2022-08-06 – 2022-10-07 (×3): qty 90, 90d supply, fill #0

## 2022-08-06 MED ORDER — ALPRAZOLAM 0.5 MG PO TABS
ORAL_TABLET | ORAL | 0 refills | Status: DC
  Filled 2022-08-06 – 2022-09-06 (×2): qty 20, 20d supply, fill #0

## 2022-08-06 NOTE — Progress Notes (Signed)
Virtual Visit via Video Note  I connected with Krista Fisher on 08/06/22 at  2:20 PM EDT by a video enabled telemedicine application and verified that I am speaking with the correct person using two identifiers.  Location: Patient: Work Secondary school teacher: Biomedical scientist.   I discussed the limitations of evaluation and management by telemedicine and the availability of in person appointments. The patient expressed understanding and agreed to proceed.  History of Present Illness: Patient is evaluated by video session.  She is taking Lexapro and Xanax as needed for panic attack.  Patient told last few weeks was very sad and she is having some time tearfulness because she find out that her ex diagnosed with terminal illness with pancreatic cancer.  She reported having a lot of conflict in her head because she is not sure what to do.  Patient told his life expectancy is not good.  Before the meals she was doing better with the Xanax and Lexapro.  Now she noticed some time trouble with sleep and having nightmares.  She is in therapy through the employee assistance program.  She has few panic attack and taking Xanax.  She had some ruminative thoughts but denies any suicidal thoughts.  She had a court date in July and her ex did not show up and her next court date is in September.  Patient is still has order of protection against him.  Patient will be been married for 2 years but known to each other for 12 years.  Her job is going well.  Currently level is fair.  She had a good support from her mother, sister, cousin and brother-in-law.  She has no tremor or shakes or any EPS.  She denies any anger or mania.  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   Past Psychiatric History: H/O depression, anxiety and childhood trauma. Saw psychiatrist once but most of the time medicines were given by PCP. Prozac caused nightmares and  Celexa caused increased anxiety.  Wellbutrin but Paxil help the most until stopped on her own.  No history of suicidal attempt.  No history of psychiatric inpatient treatment.      Psychiatric Specialty Exam: Physical Exam  Review of Systems  Weight 281 lb (127.5 kg).There is no height or weight on file to calculate BMI.  General Appearance: Casual  Eye Contact:  Good  Speech:  Slow  Volume:  Decreased  Mood:  Dysphoric and sad  Affect:  Congruent  Thought Process:  Goal Directed  Orientation:  Full (Time, Place, and Person)  Thought Content:  Rumination  Suicidal Thoughts:  No  Homicidal Thoughts:  No  Memory:  Immediate;   Good Recent;   Good Remote;   Good  Judgement:  Intact  Insight:  Present  Psychomotor Activity:  Normal  Concentration:  Concentration: Good and Attention Span: Good  Recall:  Good  Fund of Knowledge:  Good  Language:  Good  Akathisia:  No  Handed:  Right  AIMS (if indicated):     Assets:  Communication Skills Desire for Improvement Housing Social Support Transportation  ADL's:  Intact  Cognition:  WNL  Sleep:   nightmares      Assessment and Plan: PTSD.  Major depressive disorder, recurrent.  Panic attacks.  Discuss her stress related to ex who is diagnosed with terminal illness.  Reassurance given.  Recommend to discuss with her  therapist, family members and even consider legal help if she ever decided to contact him.  Patient reported a lot of mixed and conflict emotions.  She is hoping that therapy and talking the family will help to resolve these complex emotions.  She has her next court date coming in September to extend order of protection.  She is not sure if her ex will show up.  She is also not sure what kind of mental and physical condition he is.  We talk about medication and patient agreed to continue Lexapro daily and Xanax as needed for severe anxiety and panic attack.  We will send a new prescription to her local pharmacy at [facility].  Recommended to call us back if she is any questions or any concern.  Follow-up in 3 months.  Follow Up Instructions:    I discussed the assessment and treatment plan with the patient. The patient was provided an opportunity to ask questions and all were answered. The patient agreed with the plan and demonstrated an understanding of the instructions.   The patient was advised to call back or seek an in-person evaluation if the symptoms worsen or if the condition fails to improve as anticipated.  Collaboration of Care: Other provider involved in patient's care AEB notes are available in epic to review.  Patient/Guardian was advised Release of Information must be obtained prior to any record release in order to collaborate their care with an outside provider. Patient/Guardian was advised if they have not already done so to contact the registration department to sign all necessary forms in order for [place] to release information regarding their care.   Consent: Patient/Guardian gives verbal consent for treatment and assignment of benefits for services provided during this visit. Patient/Guardian expressed understanding and agreed to proceed.    I provided 20 minutes of non-face-to-face time during this encounter.   Kathlee Nations, MD

## 2022-08-07 ENCOUNTER — Other Ambulatory Visit (HOSPITAL_COMMUNITY): Payer: Self-pay

## 2022-08-08 ENCOUNTER — Encounter (HOSPITAL_COMMUNITY): Admission: EM | Disposition: A | Discharge status: Home / Self Care | Payer: Self-pay | Source: Home / Self Care

## 2022-08-08 ENCOUNTER — Other Ambulatory Visit: Payer: Self-pay

## 2022-08-08 ENCOUNTER — Inpatient Hospital Stay (HOSPITAL_COMMUNITY): Payer: 59 | Admitting: Anesthesiology

## 2022-08-08 ENCOUNTER — Inpatient Hospital Stay (HOSPITAL_COMMUNITY)
Admission: EM | Admit: 2022-08-08 | Discharge: 2022-08-14 | DRG: 330 | Disposition: A | Discharge status: Home / Self Care | Payer: 59 | Attending: General Surgery | Admitting: General Surgery

## 2022-08-08 ENCOUNTER — Inpatient Hospital Stay (HOSPITAL_COMMUNITY): Payer: 59

## 2022-08-08 ENCOUNTER — Encounter (HOSPITAL_COMMUNITY): Payer: Self-pay

## 2022-08-08 ENCOUNTER — Emergency Department (HOSPITAL_COMMUNITY): Payer: 59

## 2022-08-08 DIAGNOSIS — R531 Weakness: Secondary | ICD-10-CM | POA: Diagnosis not present

## 2022-08-08 DIAGNOSIS — K55029 Acute infarction of small intestine, extent unspecified: Secondary | ICD-10-CM | POA: Diagnosis not present

## 2022-08-08 DIAGNOSIS — J9589 Other postprocedural complications and disorders of respiratory system, not elsewhere classified: Secondary | ICD-10-CM | POA: Diagnosis not present

## 2022-08-08 DIAGNOSIS — K460 Unspecified abdominal hernia with obstruction, without gangrene: Secondary | ICD-10-CM | POA: Diagnosis not present

## 2022-08-08 DIAGNOSIS — R0689 Other abnormalities of breathing: Secondary | ICD-10-CM | POA: Diagnosis not present

## 2022-08-08 DIAGNOSIS — Z4682 Encounter for fitting and adjustment of non-vascular catheter: Secondary | ICD-10-CM | POA: Diagnosis not present

## 2022-08-08 DIAGNOSIS — Z6841 Body Mass Index (BMI) 40.0 and over, adult: Secondary | ICD-10-CM

## 2022-08-08 DIAGNOSIS — E872 Acidosis, unspecified: Secondary | ICD-10-CM

## 2022-08-08 DIAGNOSIS — Z888 Allergy status to other drugs, medicaments and biological substances status: Secondary | ICD-10-CM

## 2022-08-08 DIAGNOSIS — R109 Unspecified abdominal pain: Secondary | ICD-10-CM | POA: Diagnosis present

## 2022-08-08 DIAGNOSIS — F32A Depression, unspecified: Secondary | ICD-10-CM | POA: Diagnosis present

## 2022-08-08 DIAGNOSIS — F419 Anxiety disorder, unspecified: Secondary | ICD-10-CM | POA: Diagnosis present

## 2022-08-08 DIAGNOSIS — Z818 Family history of other mental and behavioral disorders: Secondary | ICD-10-CM | POA: Diagnosis not present

## 2022-08-08 DIAGNOSIS — R0600 Dyspnea, unspecified: Secondary | ICD-10-CM | POA: Diagnosis not present

## 2022-08-08 DIAGNOSIS — J9601 Acute respiratory failure with hypoxia: Secondary | ICD-10-CM | POA: Diagnosis not present

## 2022-08-08 DIAGNOSIS — K219 Gastro-esophageal reflux disease without esophagitis: Secondary | ICD-10-CM | POA: Diagnosis present

## 2022-08-08 DIAGNOSIS — Z9884 Bariatric surgery status: Secondary | ICD-10-CM

## 2022-08-08 DIAGNOSIS — K567 Ileus, unspecified: Secondary | ICD-10-CM | POA: Diagnosis not present

## 2022-08-08 DIAGNOSIS — K922 Gastrointestinal hemorrhage, unspecified: Secondary | ICD-10-CM | POA: Diagnosis not present

## 2022-08-08 DIAGNOSIS — Z8249 Family history of ischemic heart disease and other diseases of the circulatory system: Secondary | ICD-10-CM

## 2022-08-08 DIAGNOSIS — R739 Hyperglycemia, unspecified: Secondary | ICD-10-CM | POA: Diagnosis present

## 2022-08-08 DIAGNOSIS — R52 Pain, unspecified: Secondary | ICD-10-CM | POA: Diagnosis not present

## 2022-08-08 DIAGNOSIS — Z5331 Laparoscopic surgical procedure converted to open procedure: Secondary | ICD-10-CM | POA: Diagnosis not present

## 2022-08-08 DIAGNOSIS — R9431 Abnormal electrocardiogram [ECG] [EKG]: Secondary | ICD-10-CM | POA: Diagnosis not present

## 2022-08-08 DIAGNOSIS — R1084 Generalized abdominal pain: Secondary | ICD-10-CM | POA: Diagnosis not present

## 2022-08-08 DIAGNOSIS — F1320 Sedative, hypnotic or anxiolytic dependence, uncomplicated: Secondary | ICD-10-CM | POA: Diagnosis present

## 2022-08-08 DIAGNOSIS — K56609 Unspecified intestinal obstruction, unspecified as to partial versus complete obstruction: Secondary | ICD-10-CM | POA: Diagnosis not present

## 2022-08-08 DIAGNOSIS — Z9889 Other specified postprocedural states: Secondary | ICD-10-CM | POA: Diagnosis not present

## 2022-08-08 DIAGNOSIS — Z91040 Latex allergy status: Secondary | ICD-10-CM | POA: Diagnosis not present

## 2022-08-08 DIAGNOSIS — Z9049 Acquired absence of other specified parts of digestive tract: Secondary | ICD-10-CM

## 2022-08-08 DIAGNOSIS — I10 Essential (primary) hypertension: Secondary | ICD-10-CM | POA: Diagnosis not present

## 2022-08-08 DIAGNOSIS — N179 Acute kidney failure, unspecified: Secondary | ICD-10-CM | POA: Diagnosis present

## 2022-08-08 DIAGNOSIS — R1011 Right upper quadrant pain: Secondary | ICD-10-CM | POA: Diagnosis not present

## 2022-08-08 DIAGNOSIS — E8721 Acute metabolic acidosis: Secondary | ICD-10-CM | POA: Diagnosis not present

## 2022-08-08 DIAGNOSIS — K6289 Other specified diseases of anus and rectum: Secondary | ICD-10-CM | POA: Diagnosis present

## 2022-08-08 DIAGNOSIS — K2970 Gastritis, unspecified, without bleeding: Secondary | ICD-10-CM | POA: Diagnosis not present

## 2022-08-08 DIAGNOSIS — G4730 Sleep apnea, unspecified: Secondary | ICD-10-CM | POA: Diagnosis not present

## 2022-08-08 DIAGNOSIS — K6389 Other specified diseases of intestine: Secondary | ICD-10-CM | POA: Diagnosis not present

## 2022-08-08 DIAGNOSIS — K449 Diaphragmatic hernia without obstruction or gangrene: Secondary | ICD-10-CM | POA: Diagnosis not present

## 2022-08-08 DIAGNOSIS — K458 Other specified abdominal hernia without obstruction or gangrene: Secondary | ICD-10-CM | POA: Diagnosis not present

## 2022-08-08 DIAGNOSIS — R188 Other ascites: Secondary | ICD-10-CM | POA: Diagnosis not present

## 2022-08-08 DIAGNOSIS — R140 Abdominal distension (gaseous): Secondary | ICD-10-CM | POA: Diagnosis not present

## 2022-08-08 HISTORY — PX: LAPAROTOMY: SHX154

## 2022-08-08 HISTORY — PX: LAPAROSCOPY: SHX197

## 2022-08-08 LAB — URINALYSIS, ROUTINE W REFLEX MICROSCOPIC
Bilirubin Urine: NEGATIVE
Glucose, UA: NEGATIVE mg/dL
Hgb urine dipstick: NEGATIVE
Ketones, ur: 20 mg/dL — AB
Nitrite: NEGATIVE
Protein, ur: NEGATIVE mg/dL
Specific Gravity, Urine: 1.017 (ref 1.005–1.030)
pH: 5 (ref 5.0–8.0)

## 2022-08-08 LAB — CBC WITH DIFFERENTIAL/PLATELET
Abs Immature Granulocytes: 0.03 10*3/uL (ref 0.00–0.07)
Basophils Absolute: 0 10*3/uL (ref 0.0–0.1)
Basophils Relative: 0 %
Eosinophils Absolute: 0.1 10*3/uL (ref 0.0–0.5)
Eosinophils Relative: 1 %
HCT: 45.5 % (ref 36.0–46.0)
Hemoglobin: 15.2 g/dL — ABNORMAL HIGH (ref 12.0–15.0)
Immature Granulocytes: 0 %
Lymphocytes Relative: 12 %
Lymphs Abs: 1.4 10*3/uL (ref 0.7–4.0)
MCH: 26.4 pg (ref 26.0–34.0)
MCHC: 33.4 g/dL (ref 30.0–36.0)
MCV: 79.1 fL — ABNORMAL LOW (ref 80.0–100.0)
Monocytes Absolute: 0.8 10*3/uL (ref 0.1–1.0)
Monocytes Relative: 7 %
Neutro Abs: 9.1 10*3/uL — ABNORMAL HIGH (ref 1.7–7.7)
Neutrophils Relative %: 80 %
Platelets: 237 10*3/uL (ref 150–400)
RBC: 5.75 MIL/uL — ABNORMAL HIGH (ref 3.87–5.11)
RDW: 14.3 % (ref 11.5–15.5)
WBC: 11.4 10*3/uL — ABNORMAL HIGH (ref 4.0–10.5)
nRBC: 0 % (ref 0.0–0.2)

## 2022-08-08 LAB — COMPREHENSIVE METABOLIC PANEL
ALT: 33 U/L (ref 0–44)
AST: 50 U/L — ABNORMAL HIGH (ref 15–41)
Albumin: 4 g/dL (ref 3.5–5.0)
Alkaline Phosphatase: 149 U/L — ABNORMAL HIGH (ref 38–126)
Anion gap: 13 (ref 5–15)
BUN: 14 mg/dL (ref 6–20)
CO2: 17 mmol/L — ABNORMAL LOW (ref 22–32)
Calcium: 9.7 mg/dL (ref 8.9–10.3)
Chloride: 112 mmol/L — ABNORMAL HIGH (ref 98–111)
Creatinine, Ser: 1 mg/dL (ref 0.44–1.00)
GFR, Estimated: >60 mL/min (ref >60)
Glucose, Bld: 119 mg/dL — ABNORMAL HIGH (ref 70–99)
Potassium: 3.9 mmol/L (ref 3.5–5.1)
Sodium: 142 mmol/L (ref 135–145)
Total Bilirubin: 0.6 mg/dL (ref 0.3–1.2)
Total Protein: 7.6 g/dL (ref 6.5–8.1)

## 2022-08-08 LAB — POCT I-STAT 7, (LYTES, BLD GAS, ICA,H+H)
Acid-base deficit: 6 mmol/L — ABNORMAL HIGH (ref 0.0–2.0)
Acid-base deficit: 9 mmol/L — ABNORMAL HIGH (ref 0.0–2.0)
Acid-base deficit: 3 mmol/L — ABNORMAL HIGH (ref 0.0–2.0)
Bicarbonate: 20.5 mmol/L (ref 20.0–28.0)
Bicarbonate: 17.3 mmol/L — ABNORMAL LOW (ref 20.0–28.0)
Bicarbonate: 22.8 mmol/L (ref 20.0–28.0)
Calcium, Ion: 1.2 mmol/L (ref 1.15–1.40)
Calcium, Ion: 1.16 mmol/L (ref 1.15–1.40)
Calcium, Ion: 1.13 mmol/L — ABNORMAL LOW (ref 1.15–1.40)
HCT: 46 % (ref 36.0–46.0)
HCT: 41 % (ref 36.0–46.0)
HCT: 44 % (ref 36.0–46.0)
Hemoglobin: 15.6 g/dL — ABNORMAL HIGH (ref 12.0–15.0)
Hemoglobin: 13.9 g/dL (ref 12.0–15.0)
Hemoglobin: 15 g/dL (ref 12.0–15.0)
O2 Saturation: 100 %
O2 Saturation: 97 %
O2 Saturation: 100 %
Patient temperature: 97.9
Potassium: 5 mmol/L (ref 3.5–5.1)
Potassium: 4.7 mmol/L (ref 3.5–5.1)
Potassium: 4.2 mmol/L (ref 3.5–5.1)
Sodium: 139 mmol/L (ref 135–145)
Sodium: 140 mmol/L (ref 135–145)
Sodium: 140 mmol/L (ref 135–145)
TCO2: 22 mmol/L (ref 22–32)
TCO2: 18 mmol/L — ABNORMAL LOW (ref 22–32)
TCO2: 24 mmol/L (ref 22–32)
pCO2 arterial: 43.9 mmHg (ref 32–48)
pCO2 arterial: 38.6 mmHg (ref 32–48)
pCO2 arterial: 42.1 mmHg (ref 32–48)
pH, Arterial: 7.276 — ABNORMAL LOW (ref 7.35–7.45)
pH, Arterial: 7.259 — ABNORMAL LOW (ref 7.35–7.45)
pH, Arterial: 7.339 — ABNORMAL LOW (ref 7.35–7.45)
pO2, Arterial: 400 mmHg — ABNORMAL HIGH (ref 83–108)
pO2, Arterial: 108 mmHg (ref 83–108)
pO2, Arterial: 580 mmHg — ABNORMAL HIGH (ref 83–108)

## 2022-08-08 LAB — LACTIC ACID, PLASMA
Lactic Acid, Venous: 5.1 mmol/L (ref 0.5–1.9)
Lactic Acid, Venous: 2.6 mmol/L (ref 0.5–1.9)
Lactic Acid, Venous: 5.7 mmol/L (ref 0.5–1.9)

## 2022-08-08 LAB — BASIC METABOLIC PANEL
Anion gap: 7 (ref 5–15)
BUN: 13 mg/dL (ref 6–20)
CO2: 18 mmol/L — ABNORMAL LOW (ref 22–32)
Calcium: 7.9 mg/dL — ABNORMAL LOW (ref 8.9–10.3)
Chloride: 115 mmol/L — ABNORMAL HIGH (ref 98–111)
Creatinine, Ser: 0.78 mg/dL (ref 0.44–1.00)
GFR, Estimated: >60 mL/min (ref >60)
Glucose, Bld: 163 mg/dL — ABNORMAL HIGH (ref 70–99)
Potassium: 4.4 mmol/L (ref 3.5–5.1)
Sodium: 140 mmol/L (ref 135–145)

## 2022-08-08 LAB — GLUCOSE, CAPILLARY: Glucose-Capillary: 172 mg/dL — ABNORMAL HIGH (ref 70–99)

## 2022-08-08 LAB — MAGNESIUM: Magnesium: 1.4 mg/dL — ABNORMAL LOW (ref 1.7–2.4)

## 2022-08-08 LAB — I-STAT BETA HCG BLOOD, ED (MC, WL, AP ONLY): I-stat hCG, quantitative: <5 m[IU]/mL (ref <5)

## 2022-08-08 LAB — LIPASE, BLOOD: Lipase: 33 U/L (ref 11–51)

## 2022-08-08 LAB — HEMOGLOBIN A1C
Hgb A1c MFr Bld: 5 % (ref 4.8–5.6)
Mean Plasma Glucose: 96.8 mg/dL

## 2022-08-08 LAB — NO BLOOD PRODUCTS

## 2022-08-08 LAB — PHOSPHORUS: Phosphorus: 5.5 mg/dL — ABNORMAL HIGH (ref 2.5–4.6)

## 2022-08-08 SURGERY — LAPAROSCOPY, DIAGNOSTIC
Anesthesia: General | Site: Abdomen

## 2022-08-08 MED ORDER — MELATONIN 3 MG PO TABS: 3.0000 mg | ORAL_TABLET | Freq: Every evening | ORAL | Status: DC | PRN

## 2022-08-08 MED ORDER — HYDROMORPHONE HCL 1 MG/ML IJ SOLN
1.0000 mg | Freq: Once | INTRAMUSCULAR | Status: AC
  Administered 2022-08-08: 1 mg via INTRAVENOUS
  Filled 2022-08-08: qty 1

## 2022-08-08 MED ORDER — ORAL CARE MOUTH RINSE: 15.0000 mL | Freq: Once | OROMUCOSAL | Status: DC

## 2022-08-08 MED ORDER — CHLORHEXIDINE GLUCONATE 0.12 % MT SOLN: 15.0000 mL | Freq: Once | OROMUCOSAL | Status: DC

## 2022-08-08 MED ORDER — PHENYLEPHRINE 80 MCG/ML (10ML) SYRINGE FOR IV PUSH (FOR BLOOD PRESSURE SUPPORT)
PREFILLED_SYRINGE | INTRAVENOUS | Status: DC | PRN
  Administered 2022-08-08: 160 ug via INTRAVENOUS
  Administered 2022-08-08 (×3): 240 ug via INTRAVENOUS

## 2022-08-08 MED ORDER — PANTOPRAZOLE SODIUM 40 MG IV SOLR
40.0000 mg | INTRAVENOUS | Status: DC
  Administered 2022-08-08 – 2022-08-09 (×2): 40 mg via INTRAVENOUS
  Filled 2022-08-08 (×2): qty 10

## 2022-08-08 MED ORDER — ENOXAPARIN SODIUM 40 MG/0.4ML IJ SOSY
40.0000 mg | PREFILLED_SYRINGE | INTRAMUSCULAR | Status: DC
  Administered 2022-08-09: 40 mg via SUBCUTANEOUS
  Filled 2022-08-08: qty 0.4

## 2022-08-08 MED ORDER — CHLORHEXIDINE GLUCONATE CLOTH 2 % EX PADS
6.0000 | MEDICATED_PAD | Freq: Every day | CUTANEOUS | Status: DC
  Administered 2022-08-08 – 2022-08-14 (×6): 6 via TOPICAL

## 2022-08-08 MED ORDER — IOHEXOL 350 MG/ML SOLN
100.0000 mL | Freq: Once | INTRAVENOUS | Status: AC | PRN
  Administered 2022-08-08: 100 mL via INTRAVENOUS

## 2022-08-08 MED ORDER — 0.9 % SODIUM CHLORIDE (POUR BTL) OPTIME
TOPICAL | Status: DC | PRN
  Administered 2022-08-08 (×5): 1000 mL

## 2022-08-08 MED ORDER — KETAMINE HCL 50 MG/5ML IJ SOSY
PREFILLED_SYRINGE | INTRAMUSCULAR | Status: AC
  Filled 2022-08-08: qty 5

## 2022-08-08 MED ORDER — POLYETHYLENE GLYCOL 3350 17 G PO PACK: 17.0000 g | PACK | Freq: Every day | ORAL | Status: DC

## 2022-08-08 MED ORDER — FENTANYL CITRATE PF 50 MCG/ML IJ SOSY
50.0000 ug | PREFILLED_SYRINGE | Freq: Once | INTRAMUSCULAR | Status: AC
  Administered 2022-08-08: 50 ug via INTRAVENOUS

## 2022-08-08 MED ORDER — KETAMINE HCL 10 MG/ML IJ SOLN
INTRAMUSCULAR | Status: DC | PRN
  Administered 2022-08-08 (×2): 20 mg via INTRAVENOUS
  Administered 2022-08-08: 10 mg via INTRAVENOUS

## 2022-08-08 MED ORDER — LACTATED RINGERS IV BOLUS
1000.0000 mL | Freq: Once | INTRAVENOUS | Status: AC
  Administered 2022-08-08: 1000 mL via INTRAVENOUS

## 2022-08-08 MED ORDER — FENTANYL 2500MCG IN NS 250ML (10MCG/ML) PREMIX INFUSION
50.0000 ug/h | INTRAVENOUS | Status: DC
  Administered 2022-08-08: 50 ug/h via INTRAVENOUS
  Administered 2022-08-09: 100 ug/h via INTRAVENOUS
  Filled 2022-08-08 (×2): qty 250

## 2022-08-08 MED ORDER — METOCLOPRAMIDE HCL 5 MG/ML IJ SOLN
10.0000 mg | Freq: Once | INTRAMUSCULAR | Status: AC
Start: 2022-08-08 — End: 2022-08-08
  Administered 2022-08-08: 10 mg via INTRAVENOUS
  Filled 2022-08-08: qty 2

## 2022-08-08 MED ORDER — FENTANYL CITRATE (PF) 250 MCG/5ML IJ SOLN
INTRAMUSCULAR | Status: DC | PRN
Start: 2022-08-08 — End: 2022-08-08
  Administered 2022-08-08 (×3): 50 ug via INTRAVENOUS

## 2022-08-08 MED ORDER — SODIUM CHLORIDE 0.9 % IV SOLN: INTRAVENOUS | Status: DC

## 2022-08-08 MED ORDER — SODIUM CHLORIDE 0.9 % IV SOLN: INTRAVENOUS | Status: DC | PRN

## 2022-08-08 MED ORDER — ONDANSETRON HCL 4 MG/2ML IJ SOLN: 4.0000 mg | Freq: Once | INTRAMUSCULAR | Status: DC | PRN

## 2022-08-08 MED ORDER — INSULIN ASPART 100 UNIT/ML IJ SOLN
0.0000 [IU] | INTRAMUSCULAR | Status: DC
  Administered 2022-08-08: 2 [IU] via SUBCUTANEOUS
  Administered 2022-08-09 – 2022-08-12 (×8): 1 [IU] via SUBCUTANEOUS

## 2022-08-08 MED ORDER — PROPOFOL 1000 MG/100ML IV EMUL
0.0000 ug/kg/min | INTRAVENOUS | Status: DC
  Administered 2022-08-08: 30 ug/kg/min via INTRAVENOUS
  Filled 2022-08-08: qty 100

## 2022-08-08 MED ORDER — PROPOFOL 1000 MG/100ML IV EMUL
INTRAVENOUS | Status: AC
  Filled 2022-08-08: qty 100

## 2022-08-08 MED ORDER — PHENYLEPHRINE HCL-NACL 20-0.9 MG/250ML-% IV SOLN
INTRAVENOUS | Status: DC | PRN
  Administered 2022-08-08: 150 ug/min via INTRAVENOUS

## 2022-08-08 MED ORDER — CHLORHEXIDINE GLUCONATE 0.12 % MT SOLN
OROMUCOSAL | Status: AC
  Filled 2022-08-08: qty 15

## 2022-08-08 MED ORDER — ONDANSETRON 4 MG PO TBDP: 4.0000 mg | ORAL_TABLET | Freq: Four times a day (QID) | ORAL | Status: DC | PRN

## 2022-08-08 MED ORDER — ONDANSETRON HCL 4 MG/2ML IJ SOLN
4.0000 mg | Freq: Four times a day (QID) | INTRAMUSCULAR | Status: DC | PRN
  Administered 2022-08-08: 4 mg via INTRAVENOUS
  Filled 2022-08-08 (×2): qty 2

## 2022-08-08 MED ORDER — METHOCARBAMOL 1000 MG/10ML IJ SOLN
500.0000 mg | Freq: Three times a day (TID) | INTRAMUSCULAR | Status: DC | PRN
  Filled 2022-08-08: qty 5

## 2022-08-08 MED ORDER — METHOCARBAMOL 500 MG PO TABS: 500.0000 mg | ORAL_TABLET | Freq: Three times a day (TID) | ORAL | Status: DC | PRN

## 2022-08-08 MED ORDER — FENTANYL 2500MCG IN NS 250ML (10MCG/ML) PREMIX INFUSION: 0.0000 ug/h | INTRAVENOUS | Status: DC

## 2022-08-08 MED ORDER — FENTANYL BOLUS VIA INFUSION
50.0000 ug | INTRAVENOUS | Status: DC | PRN
  Administered 2022-08-08 – 2022-08-09 (×5): 100 ug via INTRAVENOUS

## 2022-08-08 MED ORDER — PROPOFOL 10 MG/ML IV BOLUS
INTRAVENOUS | Status: AC
  Filled 2022-08-08: qty 20

## 2022-08-08 MED ORDER — SODIUM CHLORIDE 0.9 % IV SOLN
INTRAVENOUS | Status: DC
  Filled 2022-08-08 (×4): qty 1000

## 2022-08-08 MED ORDER — LACTATED RINGERS IV SOLN: INTRAVENOUS | Status: DC

## 2022-08-08 MED ORDER — ORAL CARE MOUTH RINSE
15.0000 mL | OROMUCOSAL | Status: DC
  Administered 2022-08-09 (×5): 15 mL via OROMUCOSAL

## 2022-08-08 MED ORDER — FENTANYL CITRATE (PF) 250 MCG/5ML IJ SOLN
INTRAMUSCULAR | Status: AC
  Filled 2022-08-08: qty 5

## 2022-08-08 MED ORDER — SODIUM BICARBONATE 8.4 % IV SOLN
INTRAVENOUS | Status: DC | PRN
  Administered 2022-08-08: 50 meq via INTRAVENOUS

## 2022-08-08 MED ORDER — DIPHENHYDRAMINE HCL 25 MG PO CAPS: 25.0000 mg | ORAL_CAPSULE | Freq: Four times a day (QID) | ORAL | Status: DC | PRN

## 2022-08-08 MED ORDER — HYDROMORPHONE HCL 1 MG/ML IJ SOLN
1.0000 mg | INTRAMUSCULAR | Status: DC | PRN
  Administered 2022-08-08: 2 mg via INTRAVENOUS
  Filled 2022-08-08: qty 2

## 2022-08-08 MED ORDER — MIDAZOLAM-SODIUM CHLORIDE 100-0.9 MG/100ML-% IV SOLN: 0.5000 mg/h | INTRAVENOUS | Status: DC

## 2022-08-08 MED ORDER — CEFAZOLIN SODIUM-DEXTROSE 2-4 GM/100ML-% IV SOLN
2.0000 g | INTRAVENOUS | Status: AC
  Administered 2022-08-08: 2 g via INTRAVENOUS
  Filled 2022-08-08 (×2): qty 100

## 2022-08-08 MED ORDER — ORAL CARE MOUTH RINSE: 15.0000 mL | OROMUCOSAL | Status: DC | PRN

## 2022-08-08 MED ORDER — MIDAZOLAM HCL 2 MG/2ML IJ SOLN
INTRAMUSCULAR | Status: AC
  Filled 2022-08-08: qty 2

## 2022-08-08 MED ORDER — PIPERACILLIN-TAZOBACTAM 3.375 G IVPB
3.3750 g | Freq: Three times a day (TID) | INTRAVENOUS | Status: AC
  Administered 2022-08-08 – 2022-08-10 (×7): 3.375 g via INTRAVENOUS
  Filled 2022-08-08 (×7): qty 50

## 2022-08-08 MED ORDER — DIPHENHYDRAMINE HCL 50 MG/ML IJ SOLN
25.0000 mg | Freq: Four times a day (QID) | INTRAMUSCULAR | Status: DC | PRN
Start: 2022-08-08 — End: 2022-08-09

## 2022-08-08 MED ORDER — DOCUSATE SODIUM 50 MG/5ML PO LIQD: 100.0000 mg | Freq: Two times a day (BID) | ORAL | Status: DC

## 2022-08-08 MED ORDER — HYDROMORPHONE HCL 1 MG/ML IJ SOLN: 1.0000 mg | INTRAMUSCULAR | Status: DC | PRN

## 2022-08-08 MED ORDER — MIDAZOLAM HCL 5 MG/5ML IJ SOLN
INTRAMUSCULAR | Status: DC | PRN
  Administered 2022-08-08 (×2): 1 mg via INTRAVENOUS

## 2022-08-08 MED ORDER — KCL IN DEXTROSE-NACL 20-5-0.45 MEQ/L-%-% IV SOLN
INTRAVENOUS | Status: DC
  Filled 2022-08-08: qty 1000

## 2022-08-08 SURGICAL SUPPLY — 72 items
ADH SKN CLS APL DERMABOND .7 (GAUZE/BANDAGES/DRESSINGS) ×1
APL PRP STRL LF DISP 70% ISPRP (MISCELLANEOUS) ×1
BAG COUNTER SPONGE SURGICOUNT (BAG) ×1 IMPLANT
BAG SPNG CNTER NS LX DISP (BAG) ×1
BIOPATCH RED 1 DISK 7.0 (GAUZE/BANDAGES/DRESSINGS) IMPLANT
CANISTER SUCT 3000ML PPV (MISCELLANEOUS) IMPLANT
CHLORAPREP W/TINT 26 (MISCELLANEOUS) ×1 IMPLANT
COVER SURGICAL LIGHT HANDLE (MISCELLANEOUS) ×1 IMPLANT
DERMABOND ADVANCED (GAUZE/BANDAGES/DRESSINGS) ×1
DERMABOND ADVANCED .7 DNX12 (GAUZE/BANDAGES/DRESSINGS) ×1 IMPLANT
DRAIN CHANNEL 19F RND (DRAIN) IMPLANT
DRAIN PENROSE 0.25X18 (DRAIN) IMPLANT
DRAPE WARM FLUID 44X44 (DRAPES) ×1 IMPLANT
DRSG OPSITE POSTOP 4X12 (GAUZE/BANDAGES/DRESSINGS) IMPLANT
DRSG TEGADERM 4X4.75 (GAUZE/BANDAGES/DRESSINGS) IMPLANT
ELECT BLADE 6.5 EXT (BLADE) IMPLANT
ELECT CAUTERY BLADE 6.4 (BLADE) IMPLANT
ELECT REM PT RETURN 9FT ADLT (ELECTROSURGICAL) ×1
ELECTRODE REM PT RTRN 9FT ADLT (ELECTROSURGICAL) ×1 IMPLANT
EVACUATOR SILICONE 100CC (DRAIN) IMPLANT
GAUZE SPONGE 4X4 12PLY STRL (GAUZE/BANDAGES/DRESSINGS) IMPLANT
GLOVE BIO SURGEON STRL SZ7.5 (GLOVE) ×1 IMPLANT
GLOVE BIOGEL PI IND STRL 7.0 (GLOVE) IMPLANT
GLOVE BIOGEL PI IND STRL 7.5 (GLOVE) IMPLANT
GLOVE BIOGEL PI IND STRL 8 (GLOVE) ×1 IMPLANT
GLOVE BIOGEL PI INDICATOR 7.0 (GLOVE) ×1
GLOVE BIOGEL PI INDICATOR 7.5 (GLOVE) ×1
GLOVE BIOGEL PI INDICATOR 8 (GLOVE) ×1
GLOVE ECLIPSE 7.0 STRL STRAW (GLOVE) IMPLANT
GLOVE INDICATOR 7.0 STRL GRN (GLOVE) IMPLANT
GLOVE INDICATOR 7.5 STRL GRN (GLOVE) IMPLANT
GLOVE SURG SS PI 7.5 STRL IVOR (GLOVE) IMPLANT
GOWN STRL REUS W/ TWL LRG LVL3 (GOWN DISPOSABLE) ×2 IMPLANT
GOWN STRL REUS W/ TWL XL LVL3 (GOWN DISPOSABLE) ×1 IMPLANT
GOWN STRL REUS W/TWL LRG LVL3 (GOWN DISPOSABLE) ×2
GOWN STRL REUS W/TWL XL LVL3 (GOWN DISPOSABLE) ×1
HANDLE SUCTION POOLE (INSTRUMENTS) IMPLANT
KIT BASIN OR (CUSTOM PROCEDURE TRAY) ×1 IMPLANT
KIT TURNOVER KIT B (KITS) ×1 IMPLANT
LIGASURE IMPACT 36 18CM CVD LR (INSTRUMENTS) IMPLANT
NEEDLE 22X1 1/2 (OR ONLY) (NEEDLE) ×1 IMPLANT
NS IRRIG 1000ML POUR BTL (IV SOLUTION) ×1 IMPLANT
PAD ARMBOARD 7.5X6 YLW CONV (MISCELLANEOUS) ×2 IMPLANT
PENCIL SMOKE EVACUATOR (MISCELLANEOUS) IMPLANT
RELOAD STAPLE 60 2.6 WHT THN (STAPLE) IMPLANT
RELOAD STAPLE 60 3.6 BLU REG (STAPLE) IMPLANT
RELOAD STAPLER BLUE 60MM (STAPLE) ×4 IMPLANT
RELOAD STAPLER WHITE 60MM (STAPLE) ×4 IMPLANT
SET TUBE SMOKE EVAC HIGH FLOW (TUBING) ×1 IMPLANT
SPONGE T-LAP 18X18 ~~LOC~~+RFID (SPONGE) IMPLANT
STAPLE ECHEON FLEX 60 POW ENDO (STAPLE) IMPLANT
STAPLER RELOAD BLUE 60MM (STAPLE) ×4
STAPLER RELOAD WHITE 60MM (STAPLE) ×4
STAPLER VISISTAT 35W (STAPLE) IMPLANT
SUCTION POOLE HANDLE (INSTRUMENTS) ×2
SUT ETHILON 2 0 FS 18 (SUTURE) IMPLANT
SUT MNCRL AB 4-0 PS2 18 (SUTURE) ×1 IMPLANT
SUT SILK 2 0 SH (SUTURE) IMPLANT
SUT SILK 2 0 SH CR/8 (SUTURE) IMPLANT
SUT SILK 2 0 TIES 10X30 (SUTURE) IMPLANT
SUT SILK 3 0 SH CR/8 (SUTURE) IMPLANT
SUT SILK 3 0 TIES 10X30 (SUTURE) IMPLANT
SUT STRATAFIX 1PDS 45CM VIOLET (SUTURE) IMPLANT
SUT VIC AB 2-0 SH 18 (SUTURE) IMPLANT
SUT VIC AB 3-0 SH 27 (SUTURE) ×2
SUT VIC AB 3-0 SH 27X BRD (SUTURE) IMPLANT
TOWEL GREEN STERILE (TOWEL DISPOSABLE) ×1 IMPLANT
TRAY LAPAROSCOPIC MC (CUSTOM PROCEDURE TRAY) ×1 IMPLANT
TROCAR XCEL NON-BLD 5MMX100MML (ENDOMECHANICALS) IMPLANT
TROCAR Z-THREAD OPTICAL 5X100M (TROCAR) IMPLANT
WARMER LAPAROSCOPE (MISCELLANEOUS) ×1 IMPLANT
YANKAUER SUCT BULB TIP NO VENT (SUCTIONS) IMPLANT

## 2022-08-08 NOTE — ED Notes (Signed)
Assisted pt to bedside commode.

## 2022-08-08 NOTE — ED Provider Notes (Signed)
[facility] EMERGENCY DEPARTMENT Provider Note   CSN: [id number] Arrival date & time: 08/08/22  8099     History  Chief Complaint  Patient presents with   Abdominal Pain    Krista Fisher is a 46 y.o. female.  Patient presents to the emergency department for evaluation of abdominal pain.  Patient experiencing severe right upper and mid abdominal pain.  She comes to the ER by ambulance.  She was given fentanyl 100 mcg IV with no improvement.  She was given ketamine and had some improvement but it is now wearing off and the pain is severe again.  She has just started having nausea and vomiting as well.  Patient with previous cholecystectomy and duodenal switch bariatric surgery.       Home Medications Prior to Admission medications   Medication Sig Start Date End Date Taking? Authorizing Provider  ALPRAZolam Duanne Moron) 0.5 MG tablet Take 1 tablet by mouth daily as needed for severe anxiety/panic attack. 08/06/22   Arfeen, Arlyce Harman, MD  Calcium Carb-Cholecalciferol 600-10 MG-MCG TABS Take by mouth. 01/25/20   [provider]  cetirizine (ZYRTEC) 10 MG tablet Take by mouth.    [provider]  COLLAGEN PO Take 1 tablet by mouth daily.     [provider]  escitalopram (LEXAPRO) 20 MG tablet Take 1 tablet (20 mg total) by mouth daily. 08/06/22   Kathlee Nations, MD  etonogestrel (NEXPLANON) 68 MG IMPL implant  06/06/20   [provider]  ferrous sulfate 325 (65 FE) MG tablet Take by mouth.    [provider]  fluticasone (FLONASE) 50 MCG/ACT nasal spray 1 spray by Both Nostrils route daily.    [provider]  Ginger, Zingiber officinalis, (GINGER PO) Take by mouth.    [provider]  hydrOXYzine (ATARAX) 50 MG tablet take 1 tablet (50 mg) by mouth up to 3 times per day as needed Patient not taking: Reported on 06/06/2022 10/07/21     lamoTRIgine (LAMICTAL) 25 MG tablet Take 1 tablet by mouth once daily for 1 week  then take 1 tablet twice daily as directed. Patient not taking: Reported on 06/06/2022 05/02/22   Arfeen, Arlyce Harman, MD  magnesium 30 MG tablet Take 30 mg by mouth 2 (two) times daily.    [provider]  Multiple Vitamins-Iron (MULTIVITAMINS WITH IRON) TABS tablet Take 1 tablet by mouth daily.    [provider]  omeprazole (PRILOSEC) 40 MG capsule Take 1 capsule (40 mg total) by mouth daily before a meal. 06/13/22   Terrilyn Saver, NP  Turmeric (QC TUMERIC COMPLEX PO) Take by mouth.    [provider]  Vitamin A (A-25 PO) Take by mouth.    [provider]  zinc gluconate 50 MG tablet Take 50 mg by mouth daily.    [provider]      Allergies    Lamotrigine, Latex, and Wellbutrin xl [bupropion]    Review of Systems   Review of Systems  Physical Exam Updated Vital Signs BP 128/76   Pulse (!) 59   Temp 98.1 F (36.7 C)   Resp 17   Ht '5\' 6"'$  (1.676 m)   Wt 127.5 kg   SpO2 97%   BMI 45.37 kg/m  Physical Exam Vitals and nursing note reviewed.  Constitutional:      General: She is not in acute distress.    Appearance: She is well-developed.  HENT:     Head: Normocephalic and  atraumatic.     Mouth/Throat:     Mouth: Mucous membranes are moist.  Eyes:     General: Vision grossly intact. Gaze aligned appropriately.     Extraocular Movements: Extraocular movements intact.     Conjunctiva/sclera: Conjunctivae normal.  Cardiovascular:     Rate and Rhythm: Normal rate and regular rhythm.     Pulses: Normal pulses.     Heart sounds: Normal heart sounds, S1 normal and S2 normal. No murmur heard.    No friction rub. No gallop.  Pulmonary:     Effort: Pulmonary effort is normal. No respiratory distress.     Breath sounds: Normal breath sounds.  Abdominal:     General: Bowel sounds are normal.     Palpations: Abdomen is soft.     Tenderness: There is abdominal tenderness in the right upper quadrant and epigastric area. There is no guarding or  rebound.     Hernia: No hernia is present.  Musculoskeletal:        General: No swelling.     Cervical back: Full passive range of motion without pain, normal range of motion and neck supple. No spinous process tenderness or muscular tenderness. Normal range of motion.     Right lower leg: No edema.     Left lower leg: No edema.  Skin:    General: Skin is warm and dry.     Capillary Refill: Capillary refill takes less than 2 seconds.     Findings: No ecchymosis, erythema, rash or wound.  Neurological:     General: No focal deficit present.     Mental Status: She is alert and oriented to person, place, and time.     GCS: GCS eye subscore is 4. GCS verbal subscore is 5. GCS motor subscore is 6.     Cranial Nerves: Cranial nerves 2-12 are intact.     Sensory: Sensation is intact.     Motor: Motor function is intact.     Coordination: Coordination is intact.  Psychiatric:        Attention and Perception: Attention normal.        Mood and Affect: Mood normal.        Speech: Speech normal.        Behavior: Behavior normal.     ED Results / Procedures / Treatments   Labs (all labs ordered are listed, but only abnormal results are displayed) Labs Reviewed  CBC WITH DIFFERENTIAL/PLATELET - Abnormal; Notable for the following components:      Result Value   WBC 11.4 (*)    RBC 5.75 (*)    Hemoglobin 15.2 (*)    MCV 79.1 (*)    Neutro Abs 9.1 (*)    All other components within normal limits  COMPREHENSIVE METABOLIC PANEL - Abnormal; Notable for the following components:   Chloride 112 (*)    CO2 17 (*)    Glucose, Bld 119 (*)    AST 50 (*)    Alkaline Phosphatase 149 (*)    All other components within normal limits  LACTIC ACID, PLASMA - Abnormal; Notable for the following components:   Lactic Acid, Venous 5.1 (*)    All other components within normal limits  URINALYSIS, ROUTINE W REFLEX MICROSCOPIC - Abnormal; Notable for the following components:   APPearance HAZY (*)     Ketones, ur 20 (*)    Leukocytes,Ua TRACE (*)    Bacteria, UA RARE (*)    All other components within normal limits  LIPASE, BLOOD  I-STAT BETA HCG BLOOD, ED ([facility], [facility], [facility] ONLY)    EKG EKG Interpretation  Date/Time:  Friday August 08 2022 03:34:58 EDT Ventricular Rate:  68 PR Interval:  184 QRS Duration: 108 QT Interval:  388 QTC Calculation: 413 R Axis:   87 Text Interpretation: Sinus or ectopic atrial rhythm Low voltage, extremity and precordial leads Nonspecific T abnormalities, anterior leads Confirmed by [name] [phone]) on 08/08/2022 3:41:15 AM  Radiology No results found.  Procedures Procedures    Medications Ordered in ED Medications  HYDROmorphone (DILAUDID) injection 1 mg (1 mg Intravenous Given 08/08/22 0332)  HYDROmorphone (DILAUDID) injection 1 mg (1 mg Intravenous Given 08/08/22 0510)  lactated ringers bolus 1,000 mL (1,000 mLs Intravenous New Bag/Given 08/08/22 0555)    ED Course/ Medical Decision Making/ A&P                           Medical Decision Making Amount and/or Complexity of Data Reviewed Labs: ordered. Radiology: ordered.  Risk Prescription drug management.   Patient presents to the emergency department for evaluation of abdominal pain.  Patient reports onset of severe upper abdominal pain around 11 PM after eating.  Pain is more to the right side.  Patient arrives to the ED moaning and crying out due to the level of pain.  EMS reports that she was in severe distress at arrival, diaphoretic.  Patient has required multiple analgesia doses to become somewhat comfortable.  Patient does have a history of bariatric surgery.  Pain seemed to be somewhat out of proportion to the exam.  Basic labs are fairly unremarkable but she does have a significantly elevated lactic acid.  Will obtain CT angiography of abdomen and pelvis to further evaluate.  Signout oncoming ER physician to follow-up on results.        Final Clinical Impression(s)  / ED Diagnoses Final diagnoses:  Right upper quadrant abdominal pain    Rx / DC Orders ED Discharge Orders     None         [name], MD 08/08/22 [phone]

## 2022-08-08 NOTE — H&P (Addendum)
Krista Fisher 1976-08-05  [id number].    Chief Complaint/Reason for Consult: abdominal pain, s/p bariatric surgery  HPI:  This is a very pleasant 46 year old female with a history of anxiety and depression who underwent a gastric sleeve at [facility] in 2010.  She initially lost weight but then regained her weight.  She saw [facility] in 2021 and underwent a biliopancreatic diversion with duodenal switch.  She has since lost between 80 and 90 pounds.  She intermittently will have gas pain that she takes Gas-X for and this tends to resolve.  However, last night her pain started and she initially felt like it was gas pain.  She took more Gas-X but this did not help.  Her pain is a constant cramp with intermittent severe sharp pains.  She has had some nausea and vomiting.  This was nonbloody however she did vomit in front of me in the ER and it did have some red tinge to it.  It is unclear if this was blood versus some of the Gas-X, which is red.  She did have a bowel movement last night which did not help resolve her pain.  She presented to the emergency department today secondary to persistence of her pain.  Her lactic acid is 5.1.  Her white blood cell count is 11.4 with a hemoglobin of 15.2 indicating that she probably is relatively dry as well.  Her CMET is overall unremarkable.  She underwent a CT scan that reveals a distal small bowel obstruction of indeterminate etiology.  No mesenteric arterial occlusive disease was noted.  We have been asked to evaluate the patient for further recommendations.  ROS: ROS see HPI  Family History  Problem Relation Age of Onset   Learning disabilities Father    Hypertension Father    Hyperlipidemia Father    Heart disease Father    Heart failure Father    Diabetes Father    Learning disabilities Son    Depression Son    Depression Paternal Uncle    Drug abuse Paternal Uncle    Early death Paternal Uncle    Diabetes Maternal Grandmother    Intellectual  disability Maternal Grandmother    Hypertension Maternal Grandmother    Hyperlipidemia Maternal Grandmother    Heart disease Maternal Grandmother    Diabetes Paternal Grandmother    Colon cancer Neg Hx    Stomach cancer Neg Hx    Esophageal cancer Neg Hx    Pancreatic cancer Neg Hx     Past Medical History:  Diagnosis Date   GERD (gastroesophageal reflux disease)    Hypertension     Past Surgical History:  Procedure Laterality Date   CESAREAN SECTION     CHOLECYSTECTOMY     GASTRECTOMY     GASTRIC BYPASS     IR ANGIOGRAM PELVIS SELECTIVE OR SUPRASELECTIVE  09/06/2020   IR ANGIOGRAM SELECTIVE EACH ADDITIONAL VESSEL  09/06/2020   IR ANGIOGRAM SELECTIVE EACH ADDITIONAL VESSEL  09/06/2020   IR EMBO TUMOR ORGAN ISCHEMIA INFARCT INC GUIDE ROADMAPPING  09/06/2020   IR RADIOLOGIST EVAL & MGMT  07/11/2020   IR RADIOLOGIST EVAL & MGMT  09/19/2020   IR RADIOLOGIST EVAL & MGMT  05/08/2021   IR US GUIDE VASC ACCESS LEFT  09/06/2020   KNEE SURGERY     WRIST FRACTURE SURGERY      Social History:  reports that she has never smoked. She has never used smokeless tobacco. She reports that she does not drink alcohol and  does not use drugs.  Allergies:  Allergies  Allergen Reactions   Lamotrigine Other (See Comments)    Patient had headaches, abdominal cramping   Wellbutrin Xl [Bupropion] Other (See Comments)    Abdominal pain   Latex Rash and Other (See Comments)    Burning    (Not in a hospital admission)    Physical Exam: Blood pressure (!) 156/90, pulse 60, temperature 98 F (36.7 C), temperature source Oral, resp. rate 16, height '5\' 6"'$  (1.676 m), weight 127.5 kg, SpO2 96 %. General: pleasant, WD, WN, moderately obese black female who is laying in bed in mild distress secondary to pain HEENT: head is normocephalic, atraumatic.  Sclera are noninjected.  PERRL.  Ears and nose without any masses or lesions.  Mouth is pink and moist Heart: regular, rate, and rhythm.  Normal s1,s2. No  obvious murmurs, gallops, or rubs noted.  Palpable radial and pedal pulses bilaterally Lungs: CTAB, no wheezes, rhonchi, or rales noted.  Respiratory effort nonlabored Abd: soft, very tender in her upper abdomen particularly in her epigastrium with some voluntary guarding.  She refuses to lay on her back secondary to pain.  Subjective distention but difficult to objectively tell due to obesity. Absent BS, no masses, hernias, or organomegaly MS: all 4 extremities are symmetrical with no cyanosis, clubbing, or edema. Skin: warm and dry with no masses, lesions, or rashes Neuro: Cranial nerves 2-12 grossly intact, sensation is normal throughout Psych: A&Ox3 with an appropriate affect.   Results for orders placed or performed during the hospital encounter of 08/08/22 (from the past 48 hour(s))  CBC with Differential/Platelet     Status: Abnormal   Collection Time: 08/08/22  3:59 AM  Result Value Ref Range   WBC 11.4 (H) 4.0 - 10.5 K/uL   RBC 5.75 (H) 3.87 - 5.11 MIL/uL   Hemoglobin 15.2 (H) 12.0 - 15.0 g/dL   HCT 45.5 36.0 - 46.0 %   MCV 79.1 (L) 80.0 - 100.0 fL   MCH 26.4 26.0 - 34.0 pg   MCHC 33.4 30.0 - 36.0 g/dL   RDW 14.3 11.5 - 15.5 %   Platelets 237 150 - 400 K/uL   nRBC 0.0 0.0 - 0.2 %   Neutrophils Relative % 80 %   Neutro Abs 9.1 (H) 1.7 - 7.7 K/uL   Lymphocytes Relative 12 %   Lymphs Abs 1.4 0.7 - 4.0 K/uL   Monocytes Relative 7 %   Monocytes Absolute 0.8 0.1 - 1.0 K/uL   Eosinophils Relative 1 %   Eosinophils Absolute 0.1 0.0 - 0.5 K/uL   Basophils Relative 0 %   Basophils Absolute 0.0 0.0 - 0.1 K/uL   Immature Granulocytes 0 %   Abs Immature Granulocytes 0.03 0.00 - 0.07 K/uL    Comment: Performed at [facility], [address] [place], [place] [address]  Comprehensive metabolic panel     Status: Abnormal   Collection Time: 08/08/22  3:59 AM  Result Value Ref Range   Sodium 142 135 - 145 mmol/L   Potassium 3.9 3.5 - 5.1 mmol/L   Chloride 112 (H) 98 - 111  mmol/L   CO2 17 (L) 22 - 32 mmol/L   Glucose, Bld 119 (H) 70 - 99 mg/dL    Comment: Glucose reference range applies only to samples taken after fasting for at least 8 hours.   BUN 14 6 - 20 mg/dL   Creatinine, Ser 1.00 0.44 - 1.00 mg/dL   Calcium 9.7 8.9 -  10.3 mg/dL   Total Protein 7.6 6.5 - 8.1 g/dL   Albumin 4.0 3.5 - 5.0 g/dL   AST 50 (H) 15 - 41 U/L   ALT 33 0 - 44 U/L   Alkaline Phosphatase 149 (H) 38 - 126 U/L   Total Bilirubin 0.6 0.3 - 1.2 mg/dL   GFR, Estimated >60 >60 mL/min    Comment: (NOTE) Calculated using the CKD-EPI Creatinine Equation (2021)    Anion gap 13 5 - 15    Comment: Performed at [facility] [address] [place], [place] [address]  Lactic acid, plasma     Status: Abnormal   Collection Time: 08/08/22  3:59 AM  Result Value Ref Range   Lactic Acid, Venous 5.1 (HH) 0.5 - 1.9 mmol/L    Comment: CRITICAL RESULT CALLED TO, READ BACK BY AND VERIFIED WITH S.BLACKWELL, RN [phone] 08.18.23 MRIVET Performed at [facility], [place] [address] [place], [place] [address]   Lipase, blood     Status: None   Collection Time: 08/08/22  3:59 AM  Result Value Ref Range   Lipase 33 11 - 51 U/L    Comment: Performed at [facility] [address] [place], [place] [address]  I-Stat beta hCG blood, ED ([facility], [facility], [facility] only)     Status: None   Collection Time: 08/08/22  4:11 AM  Result Value Ref Range   I-stat hCG, quantitative <5.0 <5 mIU/mL   Comment 3            Comment:   GEST. AGE      CONC.  (mIU/mL)   <=1 WEEK        5 - 50     2 WEEKS       50 - 500     3 WEEKS       100 - 10,000     4 WEEKS     1,000 - 30,000        FEMALE AND NON-PREGNANT FEMALE:     LESS THAN 5 mIU/mL   Urinalysis, Routine w reflex microscopic Urine, Clean Catch     Status: Abnormal   Collection Time: 08/08/22  5:02 AM  Result Value Ref Range   Color, Urine YELLOW YELLOW   APPearance HAZY (A) CLEAR   Specific Gravity, Urine 1.017 1.005 - 1.030   pH 5.0 5.0 - 8.0   Glucose,  UA NEGATIVE NEGATIVE mg/dL   Hgb urine dipstick NEGATIVE NEGATIVE   Bilirubin Urine NEGATIVE NEGATIVE   Ketones, ur 20 (A) NEGATIVE mg/dL   Protein, ur NEGATIVE NEGATIVE mg/dL   Nitrite NEGATIVE NEGATIVE   Leukocytes,Ua TRACE (A) NEGATIVE   RBC / HPF 0-5 0 - 5 RBC/hpf   WBC, UA 0-5 0 - 5 WBC/hpf   Bacteria, UA RARE (A) NONE SEEN   Squamous Epithelial / LPF 0-5 0 - 5   Mucus PRESENT     Comment: Performed at [facility], [address] [place],  [address]   CT Angio Abd/Pel W and/or Wo Contrast  Result Date: 08/08/2022 CLINICAL DATA:  Mesenteric ischemia, acute mesenteric ischemia suspected EXAM: CTA ABDOMEN AND PELVIS WITHOUT AND WITH CONTRAST TECHNIQUE: Multidetector CT imaging of the abdomen and pelvis was performed using the standard protocol during bolus administration of intravenous contrast. Multiplanar reconstructed images and MIPs were obtained and reviewed to evaluate the vascular anatomy. RADIATION DOSE REDUCTION: This exam was performed according to the departmental dose-optimization program which includes automated  exposure control, adjustment of the mA and/or kV according to patient size and/or use of iterative reconstruction technique. CONTRAST:  166m OMNIPAQUE IOHEXOL 350 MG/ML SOLN COMPARISON:  09/14/2021 and previous FINDINGS: VASCULAR Aorta: Normal caliber aorta without aneurysm, dissection, vasculitis or significant stenosis. Celiac: Patent without evidence of aneurysm, dissection, vasculitis or significant stenosis. SMA: Patent without evidence of aneurysm, dissection, vasculitis or significant stenosis. Renals: Both renal arteries are patent without evidence of aneurysm, dissection, vasculitis, fibromuscular dysplasia or significant stenosis. IMA: Patent without evidence of aneurysm, dissection, vasculitis or significant stenosis. Inflow: Patent without evidence of aneurysm, dissection, vasculitis or significant stenosis. Proximal Outflow: Bilateral common femoral  and visualized portions of the superficial and profunda femoral arteries are patent without evidence of aneurysm, dissection, vasculitis or significant stenosis. Veins: Patent hepatic veins, portal vein, SMV, splenic vein, bilateral renal veins. Iliac venous system and IVC unremarkable. No venous pathology evident. Review of the MIP images confirms the above findings. NON-VASCULAR Lower chest: Fluid distends the visualized distal esophagus. Small hiatal hernia. No pleural or pericardial effusion. Visualized lung bases clear. Hepatobiliary: No focal liver abnormality is seen. Status post cholecystectomy. No biliary dilatation. Pancreas: Unremarkable. No pancreatic ductal dilatation or surrounding inflammatory changes. Spleen: Normal in size without focal abnormality. Adrenals/Urinary Tract: Adrenal glands are unremarkable. Kidneys are normal, without renal calculi, focal lesion, or hydronephrosis. Bladder is unremarkable. Stomach/Bowel: Small hiatal hernia. Post gastric bypass. There is new fluid distension of proximal and mid small bowel loops, dilated up to 3.5 cm. There is no significant bowel wall thickening or abnormal enhancement. Distal small bowel loops are decompressed. The exact transition point is not conspicuous. The terminal ileum is incompletely distended and unremarkable. Lymphatic: No abdominal or pelvic adenopathy. Reproductive: Partially calcified uterine fibroids As before. No adnexal mass. Other: small volume pelvic ascites. No free air. Musculoskeletal: No acute or significant osseous findings. IMPRESSION: 1. Distal small bowel obstruction of of indeterminate etiology, possibly adhesions. 2. No evidence of significant mesenteric arterial occlusive disease or other vascular pathology. 3. Post gastric bypass surgery with small hiatal hernia. Electronically Signed   By: DLucrezia EuropeM.D.   On: 08/08/2022 10:27      Assessment/Plan Abdominal pain, possible SBO in the setting of prior bariatric  surgery The patient has been seen, examined, chart, labs, vitals, imaging have all personally been reviewed.  The patient has findings concerning for a small bowel obstruction of unclear etiology.  However the patient has a significantly elevated lactic acid at 5.1 which needs to be repeated as she also appears somewhat dehydrated: But she does have a history of bariatric surgery and persistent severe abdominal pain.  At this point in time we recommend emergent diagnostic laparoscopy with possible laparotomy to rule out complications from her bariatric surgery.  I have discussed this with the patient as well as her mother who is at bedside.  She understands and is agreeable to proceed.  FEN - NPO/IVFs VTE - lovenox to start post op ID - Ancef on call to OR Admit - inpatient  Anxiety Depression  I reviewed ED provider notes, last 24 h vitals and pain scores, last 48 h intake and output, last 24 h labs and trends, and last 24 h imaging results.  KHenreitta Cea PKona [facility] HospitalSur[facility] 08/08/2022, 11:39 AM Please see Amion for pager number during day hours 7:00am-4:30pm or 7:00am -11:30am on weekends

## 2022-08-08 NOTE — Transfer of Care (Signed)
Immediate Anesthesia Transfer of Care Note  Patient: Krista Fisher  Procedure(s) Performed: LAPAROSCOPY DIAGNOSTIC (Abdomen) EXPLORATORY LAPAROTOMY (Abdomen)  Patient Location: ICU  Anesthesia Type:General  Level of Consciousness: Patient remains intubated per anesthesia plan  Airway & Oxygen Therapy: Patient remains intubated per anesthesia plan and Patient placed on Ventilator (see vital sign flow sheet for setting)  Post-op Assessment: Report given to RN and Post -op Vital signs reviewed and stable  Post vital signs: Reviewed and stable  Last Vitals:  Vitals Value Taken Time  BP 129/81 08/08/22 1745  Temp    Pulse 78 08/08/22 1744  Resp 22 08/08/22 1748  SpO2 100 % 08/08/22 1744  Vitals shown include unvalidated device data.  Last Pain:  Vitals:   08/08/22 1254  TempSrc: Oral  PainSc: 10-Worst pain ever         Complications: No notable events documented.

## 2022-08-08 NOTE — ED Notes (Signed)
Pt returned to room by CT

## 2022-08-08 NOTE — Progress Notes (Signed)
Ms. Krista Fisher had a sleeve and subsequently a duodenal switch:  Laparoscopic Sleeve Gastrectomy 03/11/2011, with Dr. Eldridge Abrahams at [facility] Duodenal switch on 04/03/2020, with Dr. Ranjan Saint Lucia, MD at [facility]   She presents today with 12 hours of severe abdominal pain, bowel obstruction on CT and lactic acidosis concerning for a strangulated internal hernia.  She may have an adhesive bowel obstruction, however her history, pain, and tenderness are concerning so I do not think an attempt at NG tube decompression is indicated.  I recommended emergent diagnostic laparoscopy, possible exploratory laparotomy.  Discussed this with the patient who granted consent to proceed.  Will proceed emergently to the OR.  Felicie Morn, MD General, Bariatric and Minimally Invasive Surgery [place]

## 2022-08-08 NOTE — ED Provider Notes (Signed)
9:43 AM I discussed CT scan images with Dr. Vernard Gambles.  Seems most likely consistent with SBO of unclear transition point.  She has required multiple doses of IV Dilaudid for pain.  I will give her another liter of fluid.  There is no obvious arterial abnormality.  Discussed with Claiborne Billings of general surgery, and they will come evaluate for admission and potentially surgery.  Patient will be kept NPO.  Surgery admitted patient and will take to OR.    Sherwood Gambler, MD 08/08/22 1600

## 2022-08-08 NOTE — ED Notes (Signed)
Patient transported to CT 

## 2022-08-08 NOTE — ED Notes (Signed)
Critical lab lactic acid 5.1 called in at 0521 read to md Pollina at 0526.

## 2022-08-08 NOTE — Consult Note (Signed)
NAME:  Krista Fisher, MRN:  [id number], DOB:  1976/11/21, LOS: 0 ADMISSION DATE:  08/08/2022, CONSULTATION DATE:  8/18   REFERRING MD: Dr. Thermon Leyland, CHIEF COMPLAINT: Abdominal pain  History of Present Illness:  46 year old female who presented to [facility] ER on 8/18 with reports of abdominal pain  She has a past medical history of sleeve gastrectomy at [facility] in 2012, anxiety and depression.  The patient initially lost weight but then unfortunately regained her weight.  In 2021, she was evaluated at [facility] and underwent a biliopancreatic diversion with duodenal switch.  Since that time she has lost approximately 80 to 90 pounds.  The patient reports after surgery she intermittently has gas pain which is relieved by Gas-X.  However on the evening prior to presentation she took Gas-X and it did not relieve her pain.  Her abdominal pain was reported as a constant cramp with intermittent severe sharp pains.  She had associated nausea and vomiting.  Emesis was nonbloody per report.  However in the emergency room she was noted to have some red tinge-it was unclear if this was blood versus Gas-X which is red.  She had a bowel movement prior to presentation which did not resolve her abdominal pain.  She presented to the emergency department on 8/18 for evaluation in the setting of persistent pain.  Initial lab work notable for lactate of 5.1, WBC 11.1, Hgb 15.2.  CMP with CO2 of 17, glucose 119, alk phos 149, AST 50.  CT of the abdomen showed concern for strangulated internal hernia, adhesive bowel obstruction.  No mesenteric arterial occlusive disease noted.  Patient was admitted per general surgery.  Given her symptoms conservative management was deferred and she was recommended for diagnostic laparoscopically and possible exploratory laparotomy.  Patient was taken urgently to the operating room per Dr. Thermon Leyland.    PCCM consulted for post-operative ICU evaluation.   Pertinent  Medical History   Laparoscopic sleeve gastrectomy - 02/2009 per Dr. Toney Rakes at [facility] Duodenal switch - 03/2020 per Dr. [place] at [facility] Events: Including procedures, antibiotic start and stop dates in addition to other pertinent events   8/18 Admit with abd pain in setting of strangulated internal hernia, adhesive bowel obstruction. To OR.   Interim History / Subjective:  Afebrile  Vent - 100% FiO2, PEEP 5 On arrival to ICU, concern Aline not functional, RN questioning NGT placement  On propofol infusion   Objective   Blood pressure (!) 139/102, pulse (!) 120, temperature 98 F (36.7 C), temperature source Oral, resp. rate 18, height _0  (1.676 m), weight 127.5 kg, SpO2 96 %.        Intake/Output Summary (Last 24 hours) at 08/08/2022 1654 Last data filed at 08/08/2022 1635 Gross per 24 hour  Intake 3000 ml  Output --  Net 3000 ml   Filed Weights   08/08/22 0325  Weight: 127.5 kg    Examination: General: adult female lying in bed on vent in NAD HENT: MM pink/moist, ETT, anicteric, pupils 10m sluggishly reactive  Lungs: non-labored on vent, lungs bilaterally clear with good air entry Cardiovascular: s1s2 RRR, SR on monitor, no m/r/g Abdomen: soft, midline waffle dressing intact, JP drain with serosanguinous drainage Extremities: cool/dry, no edema, aline appears non-functional  Neuro: sedate on propofol   Resolved Hospital Problem list     Assessment & Plan:   Abdominal Pain in setting of Strangulated Hernia, Adhesive Bowel Obstruction  S/p Exploratory Laparotomy Hx Duodenal Switch  Intra-op findings of strangulated internal hernia / retro-roux defect, small bowel resection -post operative care per CCS -NPO, no meds per tube until cleared by CCS, defer timing of feeding to surgery  -PPI while on vent  -hold home MVI  Acute Respiratory Insufficiency in setting of Abd Pain  Intubated with #7 per CRNA, Sabra Heck 3, 1 attempt  -PRVC with LTVV   -wean PEEP / FiO2 for sats >90% -assess ABG one hour post arrival to ICU  -follow up CXR now  -PAD protocol with propofol + fentanyl for RASS of -1 to -2  -SBT / WUA in am with goal for extubation   Acute Metabolic Acidosis / Lactic Acidosis  At Risk AKI  In setting of bowel obstruction  -trend lactate, repeat now  -NS with KCL at 192m/hr  -follow up CMP now  -Trend BMP / urinary output -Replace electrolytes as indicated -Avoid nephrotoxic agents, ensure adequate renal perfusion  Mild Elevation LFT's  In setting of sepsis -trend LFT's   Depression, Anxiety  Benzodiazepine Dependent  -hold home wellbutrin, lexapro, xanax, lamictal until taking PO's   Malfunctioning ALine -discontinue   Best Practice (right click and "Reselect all SmartList Selections" daily)  Diet/type: NPO DVT prophylaxis: LMWH GI prophylaxis: PPI Lines: N/A Foley:  Yes, and it is still needed Code Status:  full code Last date of multidisciplinary goals of care discussion: full code.  Mother called for update from ICU perspective.  Reviewed plan of care for potential extubation in am.   Labs   CBC: Recent Labs  Lab 08/08/22 0359  WBC 11.4*  NEUTROABS 9.1*  HGB 15.2*  HCT 45.5  MCV 79.1*  PLT 2834   Basic Metabolic Panel: Recent Labs  Lab 08/08/22 0359  NA 142  K 3.9  CL 112*  CO2 17*  GLUCOSE 119*  BUN 14  CREATININE 1.00  CALCIUM 9.7   GFR: Estimated Creatinine Clearance: 96.1 mL/min (by C-G formula based on SCr of 1 mg/dL). Recent Labs  Lab 08/08/22 0359  WBC 11.4*  LATICACIDVEN 5.1*    Liver Function Tests: Recent Labs  Lab 08/08/22 0359  AST 50*  ALT 33  ALKPHOS 149*  BILITOT 0.6  PROT 7.6  ALBUMIN 4.0   Recent Labs  Lab 08/08/22 0359  LIPASE 33   No results for input(s): "AMMONIA" in the last 168 hours.  ABG    Component Value Date/Time   TCO2 22 04/13/2015 1228     Coagulation Profile: No results for input(s): "INR", "PROTIME" in the last 168  hours.  Cardiac Enzymes: No results for input(s): "CKTOTAL", "CKMB", "CKMBINDEX", "TROPONINI" in the last 168 hours.  HbA1C: Hgb A1c MFr Bld  Date/Time Value Ref Range Status  05/06/2018 04:18 PM 5.4 4.8 - 5.6 % Final    Comment:             Prediabetes: 5.7 - 6.4          Diabetes: >6.4          Glycemic control for adults with diabetes: <7.0   10/31/2009 12:00 AM 5.7 %     CBG: No results for input(s): "GLUCAP" in the last 168 hours.  Review of Systems:   Unable to complete as patient is post-operative / sedate.  Past Medical History:  She,  has a past medical history of GERD (gastroesophageal reflux disease) and Hypertension.   Surgical History:   Past Surgical History:  Procedure Laterality Date   CESAREAN SECTION     CHOLECYSTECTOMY  GASTRECTOMY     GASTRIC BYPASS     IR ANGIOGRAM PELVIS SELECTIVE OR SUPRASELECTIVE  09/06/2020   IR ANGIOGRAM SELECTIVE EACH ADDITIONAL VESSEL  09/06/2020   IR ANGIOGRAM SELECTIVE EACH ADDITIONAL VESSEL  09/06/2020   IR EMBO TUMOR ORGAN ISCHEMIA INFARCT INC GUIDE ROADMAPPING  09/06/2020   IR RADIOLOGIST EVAL & MGMT  07/11/2020   IR RADIOLOGIST EVAL & MGMT  09/19/2020   IR RADIOLOGIST EVAL & MGMT  05/08/2021   IR US GUIDE VASC ACCESS LEFT  09/06/2020   KNEE SURGERY     WRIST FRACTURE SURGERY       Social History:   reports that she has never smoked. She has never used smokeless tobacco. She reports that she does not drink alcohol and does not use drugs.   Family History:  Her family history includes Depression in her paternal uncle and son; Diabetes in her father, maternal grandmother, and paternal grandmother; Drug abuse in her paternal uncle; Early death in her paternal uncle; Heart disease in her father and maternal grandmother; Heart failure in her father; Hyperlipidemia in her father and maternal grandmother; Hypertension in her father and maternal grandmother; Intellectual disability in her maternal grandmother; Learning  disabilities in her father and son. There is no history of Colon cancer, Stomach cancer, Esophageal cancer, or Pancreatic cancer.   Allergies Allergies  Allergen Reactions   Lamotrigine Other (See Comments)    Patient had headaches, abdominal cramping   Wellbutrin Xl [Bupropion] Other (See Comments)    Abdominal pain   Latex Rash and Other (See Comments)    Burning     Home Medications  Prior to Admission medications   Medication Sig Start Date End Date Taking? Authorizing Provider  ALPRAZolam Duanne Moron) 0.5 MG tablet Take 1 tablet by mouth daily as needed for severe anxiety/panic attack. 08/06/22  Yes Arfeen, Arlyce Harman, MD  Calcium Carb-Cholecalciferol 600-10 MG-MCG TABS Take 1 tablet by mouth daily. 01/25/20  Yes [provider]  cetirizine (ZYRTEC) 10 MG tablet Take 10 mg by mouth daily.   Yes [provider]  COLLAGEN PO Take 1 tablet by mouth daily.    Yes [provider]  escitalopram (LEXAPRO) 20 MG tablet Take 1 tablet (20 mg total) by mouth daily. 08/06/22  Yes Arfeen, Arlyce Harman, MD  etonogestrel (NEXPLANON) 68 MG IMPL implant 1 each by Subdermal route once. 06/06/20  Yes [provider]  ferrous sulfate 325 (65 FE) MG tablet Take 325 mg by mouth daily with breakfast.   Yes [provider]  fluticasone (FLONASE) 50 MCG/ACT nasal spray Place 1 spray into both nostrils daily.   Yes [provider]  Ginger, Zingiber officinalis, (GINGER PO) Take 1 tablet by mouth daily.   Yes [provider]  magnesium 30 MG tablet Take 30 mg by mouth 2 (two) times daily.   Yes [provider]  Multiple Vitamins-Iron (MULTIVITAMINS WITH IRON) TABS tablet Take 1 tablet by mouth daily.   Yes [provider]  omeprazole (PRILOSEC) 40 MG capsule Take 1 capsule (40 mg total) by mouth daily before a meal. 06/13/22  Yes Terrilyn Saver, NP  Turmeric (QC TUMERIC COMPLEX PO) Take 1 tablet by mouth daily.   Yes [provider]  Vitamin  A (A-25 PO) Take 1 tablet by mouth daily.   Yes [provider]  zinc gluconate 50 MG tablet Take 50 mg by mouth daily.   Yes [provider]  hydrOXYzine (ATARAX) 50 MG tablet take 1  tablet (50 mg) by mouth up to 3 times per day as needed Patient not taking: Reported on 06/06/2022 10/07/21     lamoTRIgine (LAMICTAL) 25 MG tablet Take 1 tablet by mouth once daily for 1 week then take 1 tablet twice daily as directed. Patient not taking: Reported on 06/06/2022 05/02/22   Kathlee Nations, MD     Critical care time: 52 minutes    Noe Gens, MSN, APRN, NP-C, AGACNP-BC [facility] 08/08/2022, 4:54 PM   Please see [email or web address] for pager details.   From 7A-7P if no response, please call [phone] After hours, please call ELink [phone]

## 2022-08-08 NOTE — Anesthesia Procedure Notes (Signed)
Procedure Name: Intubation Date/Time: 08/08/2022 1:55 PM  Performed by: Georgia Duff, CRNAPre-anesthesia Checklist: Patient identified, Emergency Drugs available, Suction available and Patient being monitored Patient Re-evaluated:Patient Re-evaluated prior to induction Oxygen Delivery Method: Circle System Utilized Preoxygenation: Pre-oxygenation with 100% oxygen Induction Type: IV induction Ventilation: Mask ventilation without difficulty Laryngoscope Size: Miller and 3 Grade View: Grade I Tube type: Oral Tube size: 7.0 mm Number of attempts: 1 Airway Equipment and Method: Stylet and Oral airway Placement Confirmation: ETT inserted through vocal cords under direct vision, positive ETCO2 and breath sounds checked- equal and bilateral Secured at: 21 cm Tube secured with: Tape Dental Injury: Teeth and Oropharynx as per pre-operative assessment

## 2022-08-08 NOTE — Op Note (Signed)
Patient: Krista Fisher (03/26/1976, [id number])  Date of Surgery: 08/08/2022   Preoperative Diagnosis: Right abdominal pain   Postoperative Diagnosis: Internal hernia with strangulation of the hepatobiliary limb of the duodenal switch anatomy  Surgical Procedure:  Diagnostic laparoscopy converted to exploratory laparotomy Reduction of strangulated internal hernia (retro-roux defect) Small bowel resection (hepatobiliary limb from a few centimeters distal to the ligament of treitz to the small bowel anastomosis) Side to side, functionally end to end, stapled small bowel anastomosis (revision of old small bowel anastomosis) Omega loop hand sewn small bowel anastomosis (hepatobiliary limb to the alimentary limb)  Operative Team Members:  Surgeon(s) and Role:    * Andru Genter, Nickola Major, MD - Primary   Saverio Danker, PA-C - Assistant  Anesthesiologist: Oleta Mouse, MD; Janeece Riggers, MD CRNA: Georgia Duff, CRNA   Anesthesia: General   Fluids:  Total I/O In: 4000 [B.W.:6203] Out: -   Complications: None  Drains:  Penrose drain in the subcutaneous space and (19 Fr) Jackson-Pratt drain(s) with closed bulb suction in the abdomen placed near the hand sewn anastomosis    Specimen:  ID Type Source Tests Collected by Time Destination  1 : small bowel Tissue PATH Other SURGICAL PATHOLOGY Benjamin Casanas, Nickola Major, MD 08/08/2022 1632      Disposition:  ICU - intubated and critically ill.  Plan of Care: Admit to inpatient     Indications for Procedure: [name] is a 46 y.o. female with her history of sleeve gastrectomy, and later duodenal switch due to refractory morbid obesity who presented with abdominal pain, nausea and vomiting, lactic acidosis, and bowel obstruction on CT.  I recommended diagnostic laparoscopy, possible exploratory laparotomy for the patient..  The procedure itself as well as its risks, benefits and alternatives were discussed.  The risks discussed  included but were not limited to the risk of infection, bleeding, damage to nearby structures, and need for further surgery or operation postoperatively.  After a full discussion and all questions answered the patient granted consent to proceed.  Findings: Internal hernia of the hepatobiliary limb from the left abdomen through retro-roux defect to the right abdomen with necrosis of the hepatobiliary limb from a few centimeters distal to the ligament of treitz to the small bowel anastomosis.  Anatomic reconstruction pictured here:    Retro-roux internal hernia with herniation and strangulation of the hepatobiliary limb into the right abdomen pictured here:       Description of Procedure:   On the date stated above the patient taken the operating room suite and placed in supine position.  General endotracheal anesthesia was induced.  A timeout was completed verifying the correct patient, procedure, position, and equipment needed for the case.  The patient's abdomen was prepped and draped in usual sterile fashion.  Antibiotics were given prior to the case start.  And with a laparoscopic approach.  A 5 mm trocar was inserted in the right upper quadrant using an optical entry technique.  The abdomen was insufflated to 15 mmHg.  The laparoscope was inserted.  There is no trauma to the underlying viscera with initial trocar placement.  On inspection of the abdomen we encountered a large portion of the small bowel appeared necrotic so we decided to transition to open surgery.  A midline laparotomy was created we entered the abdomen without issues.  There was thin ascites throughout the abdomen including some proteinaceous gelatin-like ascites.    We worked to identify her anatomy.  I started the ligament of Treitz and  worked backwards.  I encountered a small bowel anastomosis approximately 100 cm from the terminal ileum.  2 segments of intestine plugged into this anastomosis consistent with her duodenal  switch anatomy.  1 segment appeared necrotic.  The necrotic segment appeared to have a separate mesentery, adjusting this of the hepatobiliary line.  The healthy segment shared a mesentery with the terminal ileum suggesting this was the Roux limb.  Tracing her limb proximally confirmed the anastomosis with the sleeve gastrectomy.  As we traced this segment, the hepatobiliary limb was noted to herniate, and appeared to be strangulated through the retro-Roux defect, being abnormally located in the right upper quadrant.  Please see photo above.  I warned anesthesia for a lactic acid load and then reduced this hernia.  I quickly worked to divide the bowel just proximal and distal to this long segment of necrosis.  The proximal resection was only a few centimeters past the ligament of Treitz.  The distal resection was added to the small bowel anastomosis.  The mesentery was divided using the LigaSure.  The specimen was passed off the field as small bowel.  I then examined the remaining intestine and decided to proceed with revision of the previous small bowel anastomosis and an omega loop type handsewn small bowel anastomosis to connect the stub of the hepatobiliary limb to the alimentary limb.  First I revised the previous JJ anastomosis.  Enterotomies were made in the antimesenteric border of the proximal and distal limbs of intestine and two 60 mm white loads of the endoscopic linear stapler were fired to create a oversized anastomosis.  The end of the anastomosis was then divided with a blue load of the endoscopic linear stapler to resect approximately half of the previous anastomosis and fully resect the old hepatobiliary connection to the anastomosis.  This left basically a stricturoplasty of one segment of intestine.  Due to the nature of this connection, it had no mesenteric defect as it shared one mesentery.    Then I worked about 50 cm proximally to create the connection between the hepatobiliary limb and  the alimentary limb.  Due to the location underneath the transverse colon near the ligament of Treitz, decision was made to proceed with a handsewn anastomosis.  We mobilized the ligament of Treitz to provide additional length to the small bowel and take tension off the anastomosis.  The staple line of the hepatobiliary limb was resected using scissors.  A posterior outer layer of the anastomosis was created using interrupted 2-0 Vicryl sutures.  The inner layer of the anastomosis was created using two running 3-0 Vicryl suture.  The corners of the inner layer were closed with Connell technique.  With the inner layer complete and outer anterior layer of the anastomosis was created using interrupted 2-0 Vicryl sutures.  There was a serosal tear on the hepatobiliary limb close to the ligament of Treitz.  This was oversewn using a running 2-0 Vicryl suture.  The mesenteric defect between the alimentary limb and the hepatobiliary limb was closed using running 2-0 silk suture.  The mesenteric defect between the Roux limb and the mesocolon and the colon was closed using running 2-0 silk suture.  This was a defect where the bowel had been strangulated.  Notably, some V-Loc suture material was still noted in this area, however it appears that had failed and the hernia defect had opened up.    A 19 [place] JP drain was brought in through the left abdomen and placed  near the handsewn anastomosis.  The abdomen was irrigated with copious saline irrigation.  No other abnormalities were noted within the abdomen other than a fibroid uterus.  A nasogastric tube was secured in place by anesthesia.  The fascia was closed using running oh strata fix suture.  The wound was irrigated with saline irrigation.  1/4 inch Penrose drain was placed in the subcutaneous space exiting the lower aspect of the incision to prevent a wound infection.  The skin was closed with 2-0 Vicryl for the deep dermal layer and skin staples.  A sterile dressing  was applied.  All sponge needle counts were correct at the end of this case.  At the end of the case we reviewed the infection status of the case. Patient: [facility] Patient Case: Emergent Infection Present At Time Of Surgery (PATOS):  Necrotic small intestine  Louanna Raw, MD General, Bariatric, & Minimally Invasive Surgery [facility], [place]

## 2022-08-08 NOTE — Progress Notes (Signed)
Initial assessment finds malpositioned NGT. Dr. Lanny Hurst notified and given verbal order to advance NGT as appropriate. Pt tolerated well. KUB ordered.

## 2022-08-08 NOTE — Progress Notes (Signed)
CCM notified of ABG results.  Fio2 weaned to 40% s/p ABG results.

## 2022-08-08 NOTE — ED Triage Notes (Signed)
One began having 10/10 RUQ pain after eating at 2300. Abd is tender to palpation and  pt c/o NVD. When ems arrived pt was drenched in sweat. Pt received 100 mg fentanyl in route and '4mg'$  zofran

## 2022-08-08 NOTE — Anesthesia Preprocedure Evaluation (Addendum)
Anesthesia Evaluation  Patient identified by MRN, date of birth, ID band Patient awake    Reviewed: Allergy & Precautions, H&P , NPO status , Patient's Chart, lab work & pertinent test results, reviewed documented beta blocker date and time   Airway Mallampati: I  TM Distance: >3 FB Neck ROM: full    Dental no notable dental hx. (+) Teeth Intact, Dental Advisory Given   Pulmonary sleep apnea ,    Pulmonary exam normal breath sounds clear to auscultation       Cardiovascular Exercise Tolerance: Good hypertension, Pt. on medications + dysrhythmias  Rhythm:regular Rate:Normal     Neuro/Psych PSYCHIATRIC DISORDERS Depression negative neurological ROS     GI/Hepatic Neg liver ROS, GERD  Medicated,  Endo/Other  Morbid obesity  Renal/GU negative Renal ROS  negative genitourinary   Musculoskeletal   Abdominal   Peds  Hematology negative hematology ROS (+)   Anesthesia Other Findings   Reproductive/Obstetrics negative OB ROS                            Anesthesia Physical Anesthesia Plan  ASA: 3 and emergent  Anesthesia Plan: General   Post-op Pain Management: Ofirmev IV (intra-op)* and Toradol IV (intra-op)*   Induction: Intravenous, Cricoid pressure planned and Rapid sequence  PONV Risk Score and Plan: 3 and Ondansetron, Dexamethasone and Treatment may vary due to age or medical condition  Airway Management Planned: Oral ETT  Additional Equipment: None  Intra-op Plan:   Post-operative Plan: Extubation in OR and Possible Post-op intubation/ventilation  Informed Consent: I have reviewed the patients History and Physical, chart, labs and discussed the procedure including the risks, benefits and alternatives for the proposed anesthesia with the patient or authorized representative who has indicated his/her understanding and acceptance.     Dental Advisory Given  Plan Discussed with:  CRNA and Anesthesiologist  Anesthesia Plan Comments: (  HPI:  This is a very pleasant 46 year old female with a history of anxiety and depression who underwent a gastric sleeve at [facility] in 2010.  She initially lost weight but then regained her weight.  She saw [facility] in 2021 and underwent a biliopancreatic diversion with duodenal switch.  She has since lost between 80 and 90 pounds.  She intermittently will have gas pain that she takes Gas-X for and this tends to resolve.  However, last night her pain started and she initially felt like it was gas pain.  She took more Gas-X but this did not help.  Her pain is a constant cramp with intermittent severe sharp pains.  She has had some nausea and vomiting.  This was nonbloody however she did vomit in front of me in the ER and it did have some red tinge to it.  It is unclear if this was blood versus some of the Gas-X, which is red.  She did have a bowel movement last night which did not help resolve her pain.  She presented to the emergency department today secondary to persistence of her pain.  Her lactic acid is 5.1.  Her white blood cell count is 11.4 with a hemoglobin of 15.2 indicating that she probably is relatively dry as well.  Her CMET is overall unremarkable.  She underwent a CT scan that reveals a distal small bowel obstruction of indeterminate etiology.  No mesenteric arterial occlusive disease was noted.)      Anesthesia Quick Evaluation

## 2022-08-09 DIAGNOSIS — K460 Unspecified abdominal hernia with obstruction, without gangrene: Secondary | ICD-10-CM | POA: Diagnosis not present

## 2022-08-09 DIAGNOSIS — J9589 Other postprocedural complications and disorders of respiratory system, not elsewhere classified: Secondary | ICD-10-CM | POA: Diagnosis not present

## 2022-08-09 DIAGNOSIS — Z9889 Other specified postprocedural states: Secondary | ICD-10-CM

## 2022-08-09 DIAGNOSIS — E872 Acidosis, unspecified: Secondary | ICD-10-CM | POA: Diagnosis not present

## 2022-08-09 LAB — CBC
HCT: 44.5 % (ref 36.0–46.0)
Hemoglobin: 15.4 g/dL — ABNORMAL HIGH (ref 12.0–15.0)
MCH: 26.5 pg (ref 26.0–34.0)
MCHC: 34.6 g/dL (ref 30.0–36.0)
MCV: 76.5 fL — ABNORMAL LOW (ref 80.0–100.0)
Platelets: 198 10*3/uL (ref 150–400)
RBC: 5.82 MIL/uL — ABNORMAL HIGH (ref 3.87–5.11)
RDW: 14.1 % (ref 11.5–15.5)
WBC: 14.8 10*3/uL — ABNORMAL HIGH (ref 4.0–10.5)
nRBC: 0 % (ref 0.0–0.2)

## 2022-08-09 LAB — TRIGLYCERIDES: Triglycerides: 32 mg/dL (ref <150)

## 2022-08-09 LAB — GLUCOSE, CAPILLARY
Glucose-Capillary: 107 mg/dL — ABNORMAL HIGH (ref 70–99)
Glucose-Capillary: 110 mg/dL — ABNORMAL HIGH (ref 70–99)
Glucose-Capillary: 112 mg/dL — ABNORMAL HIGH (ref 70–99)
Glucose-Capillary: 130 mg/dL — ABNORMAL HIGH (ref 70–99)
Glucose-Capillary: 131 mg/dL — ABNORMAL HIGH (ref 70–99)
Glucose-Capillary: 149 mg/dL — ABNORMAL HIGH (ref 70–99)
Glucose-Capillary: 96 mg/dL (ref 70–99)

## 2022-08-09 LAB — LACTIC ACID, PLASMA: Lactic Acid, Venous: 3.3 mmol/L (ref 0.5–1.9)

## 2022-08-09 LAB — BASIC METABOLIC PANEL
Anion gap: 10 (ref 5–15)
BUN: 16 mg/dL (ref 6–20)
CO2: 20 mmol/L — ABNORMAL LOW (ref 22–32)
Calcium: 8.1 mg/dL — ABNORMAL LOW (ref 8.9–10.3)
Chloride: 113 mmol/L — ABNORMAL HIGH (ref 98–111)
Creatinine, Ser: 1.02 mg/dL — ABNORMAL HIGH (ref 0.44–1.00)
GFR, Estimated: >60 mL/min (ref >60)
Glucose, Bld: 129 mg/dL — ABNORMAL HIGH (ref 70–99)
Potassium: 5 mmol/L (ref 3.5–5.1)
Sodium: 143 mmol/L (ref 135–145)

## 2022-08-09 LAB — HIV ANTIBODY (ROUTINE TESTING W REFLEX): HIV Screen 4th Generation wRfx: NONREACTIVE

## 2022-08-09 MED ORDER — METOPROLOL TARTRATE 5 MG/5ML IV SOLN
5.0000 mg | Freq: Four times a day (QID) | INTRAVENOUS | Status: DC
  Administered 2022-08-10 – 2022-08-13 (×13): 5 mg via INTRAVENOUS
  Filled 2022-08-09 (×15): qty 5

## 2022-08-09 MED ORDER — ORAL CARE MOUTH RINSE
15.0000 mL | OROMUCOSAL | Status: DC
  Administered 2022-08-09 – 2022-08-14 (×19): 15 mL via OROMUCOSAL

## 2022-08-09 MED ORDER — DEXTROSE-NACL 5-0.45 % IV SOLN: INTRAVENOUS | Status: DC

## 2022-08-09 MED ORDER — HYDROMORPHONE 1 MG/ML IV SOLN
INTRAVENOUS | Status: DC
  Filled 2022-08-09: qty 30

## 2022-08-09 MED ORDER — SODIUM CHLORIDE 0.9 % IV SOLN
12.5000 mg | Freq: Four times a day (QID) | INTRAVENOUS | Status: DC | PRN
  Administered 2022-08-09: 12.5 mg via INTRAVENOUS
  Filled 2022-08-09: qty 0.5

## 2022-08-09 MED ORDER — NALOXONE HCL 0.4 MG/ML IJ SOLN: 0.4000 mg | INTRAMUSCULAR | Status: DC | PRN

## 2022-08-09 MED ORDER — LACTATED RINGERS IV BOLUS
1000.0000 mL | Freq: Once | INTRAVENOUS | Status: AC
  Administered 2022-08-09: 1000 mL via INTRAVENOUS

## 2022-08-09 MED ORDER — DIPHENHYDRAMINE HCL 12.5 MG/5ML PO ELIX: 12.5000 mg | ORAL_SOLUTION | Freq: Four times a day (QID) | ORAL | Status: DC | PRN

## 2022-08-09 MED ORDER — MAGNESIUM SULFATE 2 GM/50ML IV SOLN
2.0000 g | Freq: Once | INTRAVENOUS | Status: AC
  Administered 2022-08-09: 2 g via INTRAVENOUS
  Filled 2022-08-09: qty 50

## 2022-08-09 MED ORDER — FENTANYL 50 MCG/ML IV PCA SOLN
INTRAVENOUS | Status: DC
  Administered 2022-08-09: 165 ug via INTRAVENOUS
  Administered 2022-08-10: 120 ug via INTRAVENOUS
  Administered 2022-08-10 – 2022-08-11 (×2): 105 ug via INTRAVENOUS
  Administered 2022-08-11: 60 ug via INTRAVENOUS
  Administered 2022-08-11: 90 ug via INTRAVENOUS
  Filled 2022-08-09 (×2): qty 25

## 2022-08-09 MED ORDER — ONDANSETRON HCL 4 MG/2ML IJ SOLN
4.0000 mg | Freq: Four times a day (QID) | INTRAMUSCULAR | Status: DC | PRN
  Administered 2022-08-09: 4 mg via INTRAVENOUS

## 2022-08-09 MED ORDER — DIPHENHYDRAMINE HCL 50 MG/ML IJ SOLN
12.5000 mg | Freq: Four times a day (QID) | INTRAMUSCULAR | Status: DC | PRN
  Administered 2022-08-14: 12.5 mg via INTRAVENOUS
  Filled 2022-08-09: qty 1

## 2022-08-09 MED ORDER — LACTATED RINGERS IV SOLN: INTRAVENOUS | Status: DC

## 2022-08-09 MED ORDER — SODIUM CHLORIDE 0.9% FLUSH: 9.0000 mL | INTRAVENOUS | Status: DC | PRN

## 2022-08-09 MED ORDER — ORAL CARE MOUTH RINSE: 15.0000 mL | OROMUCOSAL | Status: DC | PRN

## 2022-08-09 NOTE — Progress Notes (Signed)
PT Cancellation Note  Patient Details Name: Anelia Carriveau MRN: [id number] DOB: 1976-07-25   Cancelled Treatment:    Reason Eval/Treat Not Completed: Pain limiting ability to participate this afternoon. RN requesting therapy to hold until under control. Will continue to follow and evaluate as appropriate.   West Carbo, PT, DPT   Acute Rehabilitation Department   Sandra Cockayne 08/09/2022, 5:36 PM

## 2022-08-09 NOTE — Progress Notes (Signed)
1 Day Post-Op   Subjective/Chief Complaint: Left intubated post op. No overnight events.     Objective: Vital signs in last 24 hours: Temp:  [96.7 F (35.9 C)-98.4 F (36.9 C)] 97.9 F (36.6 C) (08/19 0800) Pulse Rate:  [78-132] 103 (08/19 1217) Resp:  [16-24] 16 (08/19 1217) BP: (96-139)/(64-102) 129/77 (08/19 1217) SpO2:  [94 %-100 %] 99 % (08/19 1217) FiO2 (%):  [40 %-100 %] 40 % (08/19 0800) Last BM Date :  (PTA)  Intake/Output from previous day: 08/18 0701 - 08/19 0700 In: 6593.6 [I.V.:5523.3; IV Piggyback:1070.2] Out: 4132 [Urine:1305; Drains:180; Blood:250] Intake/Output this shift: Total I/O In: 888 [I.V.:857.7; IV Piggyback:30.3] Out: 125 [Urine:125]  Gen: awake on vent, denies pain.  CV- regular, tachycardic in 100s-110s Resp: breathing comfortably, able to take deep breaths. Abd: soft, non distended, approp tender at incisions.  Ext: warm, well perfused. No edema.    Lab Results:  Recent Labs    08/08/22 0359 08/08/22 1524 08/08/22 1841 08/09/22 0425  WBC 11.4*  --   --  14.8*  HGB 15.2*   < > 15.0 15.4*  HCT 45.5   < > 44.0 44.5  PLT 237  --   --  198   < > = values in this interval not displayed.   BMET Recent Labs    08/08/22 1838 08/08/22 1841 08/09/22 0425  NA 140 140 143  K 4.4 4.2 5.0  CL 115*  --  113*  CO2 18*  --  20*  GLUCOSE 163*  --  129*  BUN 13  --  16  CREATININE 0.78  --  1.02*  CALCIUM 7.9*  --  8.1*   PT/INR No results for input(s): "LABPROT", "INR" in the last 72 hours. ABG Recent Labs    08/08/22 1640 08/08/22 1841  PHART 7.259* 7.339*  HCO3 17.3* 22.8    Studies/Results: DG Abd 1 View  Result Date: 08/08/2022 CLINICAL DATA:  NG tube placement EXAM: ABDOMEN - 1 VIEW COMPARISON:  None Available. FINDINGS: Tip of enteric tube is seen in the stomach. Bowel gas pattern is nonspecific. Skin staples are seen in anterior abdominal wall. There is a catheter in the left mid abdomen which may be outside the patient's  body or suggest is surgical drain or jejunostomy catheter. IMPRESSION: Tip of the enteric tube is seen in the stomach. Bowel gas pattern is nonspecific. Electronically Signed   By: Elmer Picker M.D.   On: 08/08/2022 18:26   DG CHEST PORT 1 VIEW  Result Date: 08/08/2022 CLINICAL DATA:  Difficulty breathing EXAM: PORTABLE CHEST 1 VIEW COMPARISON:  09/22/2017 FINDINGS: Tip of endotracheal tube is 2.8 cm above the carina. Tip of enteric tube is seen in the stomach. Cardiac size is within normal limits. There are no signs of pulmonary edema or focal pulmonary consolidation. There is no pleural effusion or pneumothorax. IMPRESSION: There are no focal infiltrates or signs of pulmonary edema. Electronically Signed   By: Elmer Picker M.D.   On: 08/08/2022 18:25   CT Angio Abd/Pel W and/or Wo Contrast  Result Date: 08/08/2022 CLINICAL DATA:  Mesenteric ischemia, acute mesenteric ischemia suspected EXAM: CTA ABDOMEN AND PELVIS WITHOUT AND WITH CONTRAST TECHNIQUE: Multidetector CT imaging of the abdomen and pelvis was performed using the standard protocol during bolus administration of intravenous contrast. Multiplanar reconstructed images and MIPs were obtained and reviewed to evaluate the vascular anatomy. RADIATION DOSE REDUCTION: This exam was performed according to the departmental dose-optimization program which includes automated exposure control,  adjustment of the mA and/or kV according to patient size and/or use of iterative reconstruction technique. CONTRAST:  138m OMNIPAQUE IOHEXOL 350 MG/ML SOLN COMPARISON:  09/14/2021 and previous FINDINGS: VASCULAR Aorta: Normal caliber aorta without aneurysm, dissection, vasculitis or significant stenosis. Celiac: Patent without evidence of aneurysm, dissection, vasculitis or significant stenosis. SMA: Patent without evidence of aneurysm, dissection, vasculitis or significant stenosis. Renals: Both renal arteries are patent without evidence of aneurysm,  dissection, vasculitis, fibromuscular dysplasia or significant stenosis. IMA: Patent without evidence of aneurysm, dissection, vasculitis or significant stenosis. Inflow: Patent without evidence of aneurysm, dissection, vasculitis or significant stenosis. Proximal Outflow: Bilateral common femoral and visualized portions of the superficial and profunda femoral arteries are patent without evidence of aneurysm, dissection, vasculitis or significant stenosis. Veins: Patent hepatic veins, portal vein, SMV, splenic vein, bilateral renal veins. Iliac venous system and IVC unremarkable. No venous pathology evident. Review of the MIP images confirms the above findings. NON-VASCULAR Lower chest: Fluid distends the visualized distal esophagus. Small hiatal hernia. No pleural or pericardial effusion. Visualized lung bases clear. Hepatobiliary: No focal liver abnormality is seen. Status post cholecystectomy. No biliary dilatation. Pancreas: Unremarkable. No pancreatic ductal dilatation or surrounding inflammatory changes. Spleen: Normal in size without focal abnormality. Adrenals/Urinary Tract: Adrenal glands are unremarkable. Kidneys are normal, without renal calculi, focal lesion, or hydronephrosis. Bladder is unremarkable. Stomach/Bowel: Small hiatal hernia. Post gastric bypass. There is new fluid distension of proximal and mid small bowel loops, dilated up to 3.5 cm. There is no significant bowel wall thickening or abnormal enhancement. Distal small bowel loops are decompressed. The exact transition point is not conspicuous. The terminal ileum is incompletely distended and unremarkable. Lymphatic: No abdominal or pelvic adenopathy. Reproductive: Partially calcified uterine fibroids As before. No adnexal mass. Other: small volume pelvic ascites. No free air. Musculoskeletal: No acute or significant osseous findings. IMPRESSION: 1. Distal small bowel obstruction of of indeterminate etiology, possibly adhesions. 2. No evidence  of significant mesenteric arterial occlusive disease or other vascular pathology. 3. Post gastric bypass surgery with small hiatal hernia. Electronically Signed   By: DLucrezia EuropeM.D.   On: 08/08/2022 10:27    Anti-infectives: Anti-infectives (From admission, onward)    Start     Dose/Rate Route Frequency Ordered Stop   08/08/22 2200  piperacillin-tazobactam (ZOSYN) IVPB 3.375 g        3.375 g 12.5 mL/hr over 240 Minutes Intravenous Every 8 hours 08/08/22 1757     08/08/22 1145  ceFAZolin (ANCEF) IVPB 2g/100 mL premix        2 g 200 mL/hr over 30 Minutes Intravenous To Surgery 08/08/22 1138 08/08/22 1400       Assessment/Plan: s/p Procedure(s): LAPAROSCOPY DIAGNOSTIC (N/A) EXPLORATORY LAPAROTOMY (N/A) Pt s/p gastric sleeve [facility]FBU 2010 Pt [place] [facility] [facility] [place]  08/08/2022- reduction of strangulated internal hernia, small bowel resection (hepatobiliary limb) Dr. SThermon Leylandafter coming in with new crampy pain, bowel obstruction and elevated lactate.   Resp: VDRF- extubate today. CV - mild tachycardia likely stress related.  No intervention at this time FEN/GI: NPO today, NGT. Await return of bowel function. Anticipate short gut and nutrition issues. D/c foley in AM tomorrow.   Heme: no issues Endo: hyperglycemia, SSI Neuro: fentanyl gtt, will change to PCA post op, prn robaxin as well Hypomagnesemia- replete and recheck in AM.   VTE ppx: lovenox GI ppx: protonix IV ID: dead bowel, anticipate short course of zosyn.  LOS: 1 day    Stark Klein 08/09/2022

## 2022-08-09 NOTE — Consult Note (Signed)
NAME:  Krista Fisher, MRN:  [id number], DOB:  08/20/1976, LOS: 1 ADMISSION DATE:  08/08/2022, CONSULTATION DATE:  8/18   REFERRING MD: Dr. Thermon Leyland, CHIEF COMPLAINT: Abdominal pain  History of Present Illness:  46 year old female who [facility] with reports of abdominal pain  She has a past medical history of sleeve gastrectomy at [facility] in 2012, anxiety and depression.  The patient initially lost weight but then unfortunately regained her weight.  In 2021, she was evaluated at [facility] and underwent a biliopancreatic diversion with duodenal switch.  Since that time she has lost approximately 80 to 90 pounds.  The patient reports after surgery she intermittently has gas pain which is relieved by Gas-X.  However on the evening prior to presentation she took Gas-X and it did not relieve her pain.  Her abdominal pain was reported as a constant cramp with intermittent severe sharp pains.  She had associated nausea and vomiting.  Emesis was nonbloody per report.  However in the emergency room she was noted to have some red tinge-it was unclear if this was blood versus Gas-X which is red.  She had a bowel movement prior to presentation which did not resolve her abdominal pain.  She presented to the emergency department on 8/18 for evaluation in the setting of persistent pain.  Initial lab work notable for lactate of 5.1, WBC 11.1, Hgb 15.2.  CMP with CO2 of 17, glucose 119, alk phos 149, AST 50.  CT of the abdomen showed concern for strangulated internal hernia, adhesive bowel obstruction.  No mesenteric arterial occlusive disease noted.  Patient was admitted per general surgery.  Given her symptoms conservative management was deferred and she was recommended for diagnostic laparoscopically and possible exploratory laparotomy.  Patient was taken urgently to the operating room per Dr. Thermon Leyland.    PCCM consulted for post-operative ICU evaluation and management  Pertinent  Medical  History  Laparoscopic sleeve gastrectomy - 02/2009 per Dr. Toney Rakes at [facility] Duodenal switch - 03/2020 per Dr. [place] at [facility] Events: Including procedures, antibiotic start and stop dates in addition to other pertinent events   8/18 Admit with abd pain in setting of strangulated internal hernia, adhesive bowel obstruction. To OR.   Interim History / Subjective:  Afebrile, remained tachycardic Tolerating spontaneous breathing trial  Objective   Blood pressure 122/86, pulse (!) 120, temperature 97.9 F (36.6 C), temperature source Axillary, resp. rate 16, height '5\' 6"'  (1.676 m), weight 127.5 kg, SpO2 99 %.    Vent Mode: PRVC FiO2 (%):  [40 %-100 %] 40 % Set Rate:  [22 bmp] 22 bmp Vt Set:  [470 mL] 470 mL PEEP:  [5 cmH20] 5 cmH20 Plateau Pressure:  [13 cmH20-16 cmH20] 13 cmH20   Intake/Output Summary (Last 24 hours) at 08/09/2022 0858 Last data filed at 08/09/2022 0700 Gross per 24 hour  Intake 6593.55 ml  Output 1735 ml  Net 4858.55 ml   Filed Weights   08/08/22 0325  Weight: 127.5 kg    Examination: Physical exam: General: Crtitically ill-appearing female, orally intubated HEENT: [place]/AT, eyes anicteric.  ETT and OGT in place Neuro: Alert, awake, following commands Chest: Coarse breath sounds, no wheezes or rhonchi.  Tolerating respiratory breathing trial Heart: Regular rate and rhythm, no murmurs or gallops Abdomen: Soft, nontender, nondistended, sluggish bowel sounds present Skin: No rash  Resolved Hospital Problem list     Assessment & Plan:  SBO in setting of Strangulated  Hernia, caused by adhesions S/p Exploratory Laparotomy Hx Duodenal Switch  Intra-op findings of strangulated internal hernia / retro-roux defect, small bowel resection Post operative care per CCS NPO, no meds per tube until cleared by CCS, defer timing of feeding to surgery  PPI while on vent  Continue NGT to low intermittent wall suction  Acute  Respiratory Insufficiency, postop Continue lung protective ventilation Patient is tolerating spontaneous breathing trial We will try to extubate her She is off sedation except fentanyl for pain control   Acute Metabolic Acidosis / Lactic Acidosis  In setting of bowel obstruction  Lactate is trending down, last checked was 3.3 Continue IV fluid with LR Stop NS  Depression, Anxiety  Benzodiazepine Dependent  Hold home wellbutrin, lexapro, xanax, lamictal until taking PO's    Best Practice (right click and "Reselect all SmartList Selections" daily)  Diet/type: NPO DVT prophylaxis: LMWH GI prophylaxis: PPI Lines: N/A Foley:  Yes, and it is still needed Code Status:  full code Last date of multidisciplinary goals of care discussion: full code.  8/18 mother called for update from ICU perspective.  Reviewed plan of care for potential extubation soon  Labs   CBC: Recent Labs  Lab 08/08/22 0359 08/08/22 1524 08/08/22 1640 08/08/22 1841 08/09/22 0425  WBC 11.4*  --   --   --  14.8*  NEUTROABS 9.1*  --   --   --   --   HGB 15.2* 15.6* 13.9 15.0 15.4*  HCT 45.5 46.0 41.0 44.0 44.5  MCV 79.1*  --   --   --  76.5*  PLT 237  --   --   --  270    Basic Metabolic Panel: Recent Labs  Lab 08/08/22 0359 08/08/22 1524 08/08/22 1640 08/08/22 1838 08/08/22 1841 08/09/22 0425  NA 142 139 140 140 140 143  K 3.9 5.0 4.7 4.4 4.2 5.0  CL 112*  --   --  115*  --  113*  CO2 17*  --   --  18*  --  20*  GLUCOSE 119*  --   --  163*  --  129*  BUN 14  --   --  13  --  16  CREATININE 1.00  --   --  0.78  --  1.02*  CALCIUM 9.7  --   --  7.9*  --  8.1*  MG  --   --   --  1.4*  --   --   PHOS  --   --   --  5.5*  --   --    GFR: Estimated Creatinine Clearance: 94.2 mL/min (A) (by C-G formula based on SCr of 1.02 mg/dL (H)). Recent Labs  Lab 08/08/22 0359 08/08/22 1838 08/08/22 2210 08/09/22 0425  WBC 11.4*  --   --  14.8*  LATICACIDVEN 5.1* 2.6* 5.7* 3.3*    Liver Function  Tests: Recent Labs  Lab 08/08/22 0359  AST 50*  ALT 33  ALKPHOS 149*  BILITOT 0.6  PROT 7.6  ALBUMIN 4.0   Recent Labs  Lab 08/08/22 0359  LIPASE 33   No results for input(s): "AMMONIA" in the last 168 hours.  ABG    Component Value Date/Time   PHART 7.339 (L) 08/08/2022 1841   PCO2ART 42.1 08/08/2022 1841   PO2ART 580 (H) 08/08/2022 1841   HCO3 22.8 08/08/2022 1841   TCO2 24 08/08/2022 1841   ACIDBASEDEF 3.0 (H) 08/08/2022 1841   O2SAT 100 08/08/2022 1841  Coagulation Profile: No results for input(s): "INR", "PROTIME" in the last 168 hours.  Cardiac Enzymes: No results for input(s): "CKTOTAL", "CKMB", "CKMBINDEX", "TROPONINI" in the last 168 hours.  HbA1C: Hgb A1c MFr Bld  Date/Time Value Ref Range Status  08/08/2022 06:38 PM 5.0 4.8 - 5.6 % Final    Comment:    (NOTE) Pre diabetes:          5.7%-6.4%  Diabetes:              >6.4%  Glycemic control for   <7.0% adults with diabetes   05/06/2018 04:18 PM 5.4 4.8 - 5.6 % Final    Comment:             Prediabetes: 5.7 - 6.4          Diabetes: >6.4          Glycemic control for adults with diabetes: <7.0     CBG: Recent Labs  Lab 08/08/22 2101 08/08/22 2359 08/09/22 0440 08/09/22 0844  GLUCAP 172* 149* 131* 112*    Critical care time:     Total critical care time: 35 minutes  Performed by: Vance care time was exclusive of separately billable procedures and treating other patients.   Critical care was necessary to treat or prevent imminent or life-threatening deterioration.   Critical care was time spent personally by me on the following activities: development of treatment plan with patient and/or surrogate as well as nursing, discussions with consultants, evaluation of patient's response to treatment, examination of patient, obtaining history from patient or surrogate, ordering and performing treatments and interventions, ordering and review of laboratory studies,  ordering and review of radiographic studies, pulse oximetry and re-evaluation of patient's condition.   Jacky Kindle, MD [facility] See Amion for pager If no response to pager, please call [phone] until 7pm After 7pm, Please call E-link [phone]

## 2022-08-09 NOTE — Progress Notes (Signed)
E-link notified of lactic acid of 5.7.

## 2022-08-09 NOTE — Procedures (Signed)
Extubation Procedure Note  Patient Details:   Name: Krista Fisher DOB: 08-12-1976 MRN: [id number]   Airway Documentation:    Vent end date: 08/09/22 Vent end time: 0905  + cuff leak test prior to extubation Evaluation  O2 sats: stable throughout Complications: No apparent complications Patient did tolerate procedure well. Bilateral Breath Sounds: Clear, Diminished   Yes pt able to speak, no distress noted, no stridor noted.    Lenna Sciara 08/09/2022, 9:14 AM

## 2022-08-09 NOTE — Progress Notes (Signed)
Wasted 32 ml of dilaudid from PCA syringe into stericycle with Normajean Baxter, RN as witness.

## 2022-08-09 NOTE — Progress Notes (Addendum)
E-Link and Dr. Kieth Brightly of surgery notified of patient's heart rate sustaining in the upper 120s. Dr. Kieth Brightly notified of lactic acid of 5.7. Patient treated for pain. Blood pressure stable. No new orders from Dr. Kieth Brightly at this time. Awaiting any CCM orders. Per Dr. Kieth Brightly, heart rate under 130 is acceptable at this time.

## 2022-08-10 DIAGNOSIS — K56609 Unspecified intestinal obstruction, unspecified as to partial versus complete obstruction: Secondary | ICD-10-CM

## 2022-08-10 DIAGNOSIS — J9589 Other postprocedural complications and disorders of respiratory system, not elsewhere classified: Secondary | ICD-10-CM | POA: Diagnosis not present

## 2022-08-10 DIAGNOSIS — E872 Acidosis, unspecified: Secondary | ICD-10-CM | POA: Diagnosis not present

## 2022-08-10 LAB — URINALYSIS, MICROSCOPIC (REFLEX)

## 2022-08-10 LAB — URINALYSIS, ROUTINE W REFLEX MICROSCOPIC
Bilirubin Urine: NEGATIVE
Glucose, UA: NEGATIVE mg/dL
Ketones, ur: NEGATIVE mg/dL
Leukocytes,Ua: NEGATIVE
Nitrite: NEGATIVE
Protein, ur: 100 mg/dL — AB
Specific Gravity, Urine: >1.03 — ABNORMAL HIGH (ref 1.005–1.030)
pH: 6 (ref 5.0–8.0)

## 2022-08-10 LAB — GLUCOSE, CAPILLARY
Glucose-Capillary: 132 mg/dL — ABNORMAL HIGH (ref 70–99)
Glucose-Capillary: 119 mg/dL — ABNORMAL HIGH (ref 70–99)
Glucose-Capillary: 103 mg/dL — ABNORMAL HIGH (ref 70–99)
Glucose-Capillary: 94 mg/dL (ref 70–99)
Glucose-Capillary: 120 mg/dL — ABNORMAL HIGH (ref 70–99)
Glucose-Capillary: 124 mg/dL — ABNORMAL HIGH (ref 70–99)

## 2022-08-10 LAB — CBC
HCT: 38.9 % (ref 36.0–46.0)
Hemoglobin: 13.7 g/dL (ref 12.0–15.0)
MCH: 26.7 pg (ref 26.0–34.0)
MCHC: 35.2 g/dL (ref 30.0–36.0)
MCV: 75.7 fL — ABNORMAL LOW (ref 80.0–100.0)
Platelets: 201 10*3/uL (ref 150–400)
RBC: 5.14 MIL/uL — ABNORMAL HIGH (ref 3.87–5.11)
RDW: 13.7 % (ref 11.5–15.5)
WBC: 18.1 10*3/uL — ABNORMAL HIGH (ref 4.0–10.5)
nRBC: 0 % (ref 0.0–0.2)

## 2022-08-10 LAB — BASIC METABOLIC PANEL
Anion gap: 7 (ref 5–15)
BUN: 13 mg/dL (ref 6–20)
CO2: 21 mmol/L — ABNORMAL LOW (ref 22–32)
Calcium: 8.3 mg/dL — ABNORMAL LOW (ref 8.9–10.3)
Chloride: 110 mmol/L (ref 98–111)
Creatinine, Ser: 1.07 mg/dL — ABNORMAL HIGH (ref 0.44–1.00)
GFR, Estimated: >60 mL/min (ref >60)
Glucose, Bld: 125 mg/dL — ABNORMAL HIGH (ref 70–99)
Potassium: 4.3 mmol/L (ref 3.5–5.1)
Sodium: 138 mmol/L (ref 135–145)

## 2022-08-10 LAB — PHOSPHORUS: Phosphorus: 3.1 mg/dL (ref 2.5–4.6)

## 2022-08-10 LAB — MAGNESIUM: Magnesium: 1.8 mg/dL (ref 1.7–2.4)

## 2022-08-10 MED ORDER — ACETAMINOPHEN 650 MG RE SUPP
650.0000 mg | Freq: Four times a day (QID) | RECTAL | Status: DC | PRN
  Administered 2022-08-10: 650 mg via RECTAL
  Filled 2022-08-10: qty 1

## 2022-08-10 MED ORDER — ENOXAPARIN SODIUM 60 MG/0.6ML IJ SOSY
60.0000 mg | PREFILLED_SYRINGE | INTRAMUSCULAR | Status: DC
  Administered 2022-08-10 – 2022-08-13 (×4): 60 mg via SUBCUTANEOUS
  Filled 2022-08-10 (×5): qty 0.6

## 2022-08-10 MED ORDER — MAGNESIUM SULFATE 2 GM/50ML IV SOLN
2.0000 g | Freq: Once | INTRAVENOUS | Status: AC
  Administered 2022-08-10: 2 g via INTRAVENOUS
  Filled 2022-08-10: qty 50

## 2022-08-10 NOTE — Progress Notes (Signed)
2 Days Post-Op   Subjective/Chief Complaint: Extubated yesterday.  Had some tachycardia; EKG confirmed sinus tach.  Tried dilaudid PCA which made her nauseated.  Switched to fentanyl PCA.  Has some nausea.  No flatus   Objective: Vital signs in last 24 hours: Temp:  [97.7 F (36.5 C)-99.9 F (37.7 C)] 99.9 F (37.7 C) (08/20 0400) Pulse Rate:  [87-133] 99 (08/20 0900) Resp:  [11-33] 25 (08/20 0900) BP: (108-144)/(53-90) 119/76 (08/20 0900) SpO2:  [90 %-100 %] 100 % (08/20 0900) FiO2 (%):  [21 %] 21 % (08/20 0756) Last BM Date :  (pta)  Intake/Output from previous day: 08/19 0701 - 08/20 0700 In: 2740.9 [I.V.:1552.1; IV Piggyback:1188.9] Out: 1856 [Urine:735; Drains:305] Intake/Output this shift: Total I/O In: 169.9 [I.V.:145.1; IV Piggyback:24.8] Out: 140 [Urine:100; Drains:40]  Gen: alert, oriented.  CV- regular, tachycardic around 100 Resp: breathing comfortably, able to take deep breaths. Abd: soft, non distended, approp tender at incisions. Dressing c/d/I.  Ext: warm, well perfused. No edema.    Lab Results:  Recent Labs    08/09/22 0425 08/10/22 0245  WBC 14.8* 18.1*  HGB 15.4* 13.7  HCT 44.5 38.9  PLT 198 201   BMET Recent Labs    08/09/22 0425 08/10/22 0245  NA 143 138  K 5.0 4.3  CL 113* 110  CO2 20* 21*  GLUCOSE 129* 125*  BUN 16 13  CREATININE 1.02* 1.07*  CALCIUM 8.1* 8.3*   PT/INR No results for input(s): "LABPROT", "INR" in the last 72 hours. ABG Recent Labs    08/08/22 1640 08/08/22 1841  PHART 7.259* 7.339*  HCO3 17.3* 22.8    Studies/Results: DG Abd 1 View  Result Date: 08/08/2022 CLINICAL DATA:  NG tube placement EXAM: ABDOMEN - 1 VIEW COMPARISON:  None Available. FINDINGS: Tip of enteric tube is seen in the stomach. Bowel gas pattern is nonspecific. Skin staples are seen in anterior abdominal wall. There is a catheter in the left mid abdomen which may be outside the patient's body or suggest is surgical drain or jejunostomy  catheter. IMPRESSION: Tip of the enteric tube is seen in the stomach. Bowel gas pattern is nonspecific. Electronically Signed   By: Elmer Picker M.D.   On: 08/08/2022 18:26   DG CHEST PORT 1 VIEW  Result Date: 08/08/2022 CLINICAL DATA:  Difficulty breathing EXAM: PORTABLE CHEST 1 VIEW COMPARISON:  09/22/2017 FINDINGS: Tip of endotracheal tube is 2.8 cm above the carina. Tip of enteric tube is seen in the stomach. Cardiac size is within normal limits. There are no signs of pulmonary edema or focal pulmonary consolidation. There is no pleural effusion or pneumothorax. IMPRESSION: There are no focal infiltrates or signs of pulmonary edema. Electronically Signed   By: Elmer Picker M.D.   On: 08/08/2022 18:25    Anti-infectives: Anti-infectives (From admission, onward)    Start     Dose/Rate Route Frequency Ordered Stop   08/08/22 2200  piperacillin-tazobactam (ZOSYN) IVPB 3.375 g        3.375 g 12.5 mL/hr over 240 Minutes Intravenous Every 8 hours 08/08/22 1757     08/08/22 1145  ceFAZolin (ANCEF) IVPB 2g/100 mL premix        2 g 200 mL/hr over 30 Minutes Intravenous To Surgery 08/08/22 1138 08/08/22 1400       Assessment/Plan: s/p Procedure(s): LAPAROSCOPY DIAGNOSTIC (N/A) EXPLORATORY LAPAROTOMY (N/A) Pt s/p gastric sleeve WFBU 2010 Pt s/p biliopancreatic diversion with duodenal switch procedure, at [place] [place] 04/03/[place].  08/08/2022- reduction of strangulated  internal hernia, small bowel resection (hepatobiliary limb) Dr. Thermon Leyland after coming in with new crampy pain, bowel obstruction and elevated lactate.   Resp: VDRF- resolved.  Pulmonary toilet, IS CV - mild tachycardia likely stress related.  Added iv metoprolol yesterday.  Improved.  FEN/GI: NPO today, NGT. Await return of bowel function. Anticipate short gut and nutrition issues. Oliguria.  Responded some to fluid bolus.  No albumin as jehovah's witness/refusal of blood products.  Continue foley for oliguria  and urinary output monitoring.   Heme: no issues Endo: hyperglycemia, SSI Neuro: PCA fentanyl. PRN robaxin as well Hypomagnesemia- repleted yesterday. Ok on recheck today.    VTE ppx: lovenox GI ppx: protonix IV ID: dead bowel, 48 hours zosyn to end today.  WBCs increased today.   Dispo: leave in icu for tachycardia and urinary output monitoring.        LOS: 2 days    Stark Klein 08/10/2022

## 2022-08-10 NOTE — Progress Notes (Signed)
E-link notified of pink, cloudy urine.

## 2022-08-10 NOTE — Progress Notes (Signed)
NAME:  Krista Fisher, MRN:  [id number], DOB:  [birth year], LOS: 2 ADMISSION DATE:  08/08/2022, CONSULTATION DATE:  8/18   REFERRING MD: Dr. Thermon Leyland, CHIEF COMPLAINT: Abdominal pain  History of Present Illness:  46 year old female who presented to [facility] ER on 8/18 with reports of abdominal pain  She has a past medical history of sleeve gastrectomy at [facility] in 2012, anxiety and depression.  The patient initially lost weight but then unfortunately regained her weight.  In 2021, she was evaluated at [facility] and underwent a biliopancreatic diversion with duodenal switch.  Since that time she has lost approximately 80 to 90 pounds.  The patient reports after surgery she intermittently has gas pain which is relieved by Gas-X.  However on the evening prior to presentation she took Gas-X and it did not relieve her pain.  Her abdominal pain was reported as a constant cramp with intermittent severe sharp pains.  She had associated nausea and vomiting.  Emesis was nonbloody per report.  However in the emergency room she was noted to have some red tinge-it was unclear if this was blood versus Gas-X which is red.  She had a bowel movement prior to presentation which did not resolve her abdominal pain.  She presented to the emergency department on 8/18 for evaluation in the setting of persistent pain.  Initial lab work notable for lactate of 5.1, WBC 11.1, Hgb 15.2.  CMP with CO2 of 17, glucose 119, alk phos 149, AST 50.  CT of the abdomen showed concern for strangulated internal hernia, adhesive bowel obstruction.  No mesenteric arterial occlusive disease noted.  Patient was admitted per general surgery.  Given her symptoms conservative management was deferred and she was recommended for diagnostic laparoscopically and possible exploratory laparotomy.  Patient was taken urgently to the operating room per Dr. Thermon Leyland.    PCCM consulted for post-operative ICU evaluation and management  Pertinent  Medical  History  Laparoscopic sleeve gastrectomy - 02/2009 per Dr. Toney Rakes at [facility] Duodenal switch - 03/2020 per Dr. [place] at [facility] Events: Including procedures, antibiotic start and stop dates in addition to other pertinent events   8/18 Admit with abd pain in setting of strangulated internal hernia, adhesive bowel obstruction. To OR.   Interim History / Subjective:  Afebrile, heart rate is better controlled after starting metoprolol Successfully extubated yesterday Tmax 99.9  Objective   Blood pressure 114/73, pulse 87, temperature 99.9 F (37.7 C), temperature source Axillary, resp. rate (!) 22, height '5\' 6"'  (1.676 m), weight 127.5 kg, SpO2 100 %.    FiO2 (%):  [21 %] 21 %   Intake/Output Summary (Last 24 hours) at 08/10/2022 0831 Last data filed at 08/10/2022 0700 Gross per 24 hour  Intake 2740.94 ml  Output 1040 ml  Net 1700.94 ml   Filed Weights   08/08/22 0325  Weight: 127.5 kg    Examination: Physical exam: General: Acutely ill-appearing morbidly obese female, lying on the bed HEENT: [place]/AT, eyes anicteric.  moist mucus membranes.  NGT in place Neuro: Alert, awake following commands Chest: Coarse breath sounds, no wheezes or rhonchi Heart: Regular rate and rhythm, no murmurs or gallops Abdomen: Soft, diffusely tender, nondistended, absent bowel sounds Skin: No rash   Resolved Hospital Problem list     Assessment & Plan:  SBO in setting of Strangulated Hernia, caused by adhesions S/p Exploratory Laparotomy Hx Duodenal Switch  Intra-op findings of strangulated internal hernia / retro-roux defect, small bowel resection  Post operative care per CCS NPO, no meds per tube until cleared by CCS, defer timing of feeding to surgery  Continue D5 half normal saline Continue NGT to low intermittent wall suction Continue fentanyl PCA for pain control She is still not passing gas, bowel functions has not returned yet  Acute  Respiratory Insufficiency, postop Patient was successfully extubated yesterday  Encourage incentive spirometry  Acute Metabolic Acidosis / Lactic Acidosis  Improved  Depression, Anxiety  Benzodiazepine Dependent  Hold home wellbutrin, lexapro, xanax, lamictal until taking PO's    Best Practice (right click and "Reselect all SmartList Selections" daily)  Diet/type: NPO DVT prophylaxis: LMWH GI prophylaxis: PPI Lines: N/A Foley:  Yes, and it is still needed Code Status:  full code Last date of multidisciplinary goals of care discussion: full code.  8/18 mother called for update from ICU perspective.  Labs   CBC: Recent Labs  Lab 08/08/22 0359 08/08/22 1524 08/08/22 1640 08/08/22 1841 08/09/22 0425 08/10/22 0245  WBC 11.4*  --   --   --  14.8* 18.1*  NEUTROABS 9.1*  --   --   --   --   --   HGB 15.2* 15.6* 13.9 15.0 15.4* 13.7  HCT 45.5 46.0 41.0 44.0 44.5 38.9  MCV 79.1*  --   --   --  76.5* 75.7*  PLT 237  --   --   --  198 355    Basic Metabolic Panel: Recent Labs  Lab 08/08/22 0359 08/08/22 1524 08/08/22 1640 08/08/22 1838 08/08/22 1841 08/09/22 0425 08/10/22 0245  NA 142   < > 140 140 140 143 138  K 3.9   < > 4.7 4.4 4.2 5.0 4.3  CL 112*  --   --  115*  --  113* 110  CO2 17*  --   --  18*  --  20* 21*  GLUCOSE 119*  --   --  163*  --  129* 125*  BUN 14  --   --  13  --  16 13  CREATININE 1.00  --   --  0.78  --  1.02* 1.07*  CALCIUM 9.7  --   --  7.9*  --  8.1* 8.3*  MG  --   --   --  1.4*  --   --  1.8  PHOS  --   --   --  5.5*  --   --  3.1   < > = values in this interval not displayed.   GFR: Estimated Creatinine Clearance: 89.8 mL/min (A) (by C-G formula based on SCr of 1.07 mg/dL (H)). Recent Labs  Lab 08/08/22 0359 08/08/22 1838 08/08/22 2210 08/09/22 0425 08/10/22 0245  WBC 11.4*  --   --  14.8* 18.1*  LATICACIDVEN 5.1* 2.6* 5.7* 3.3*  --     Liver Function Tests: Recent Labs  Lab 08/08/22 0359  AST 50*  ALT 33  ALKPHOS 149*   BILITOT 0.6  PROT 7.6  ALBUMIN 4.0   Recent Labs  Lab 08/08/22 0359  LIPASE 33   No results for input(s): "AMMONIA" in the last 168 hours.  ABG    Component Value Date/Time   PHART 7.339 (L) 08/08/2022 1841   PCO2ART 42.1 08/08/2022 1841   PO2ART 580 (H) 08/08/2022 1841   HCO3 22.8 08/08/2022 1841   TCO2 24 08/08/2022 1841   ACIDBASEDEF 3.0 (H) 08/08/2022 1841   O2SAT 100 08/08/2022 1841     Coagulation Profile: No results for  input(s): "INR", "PROTIME" in the last 168 hours.  Cardiac Enzymes: No results for input(s): "CKTOTAL", "CKMB", "CKMBINDEX", "TROPONINI" in the last 168 hours.  HbA1C: Hgb A1c MFr Bld  Date/Time Value Ref Range Status  08/08/2022 06:38 PM 5.0 4.8 - 5.6 % Final    Comment:    (NOTE) Pre diabetes:          5.7%-6.4%  Diabetes:              >6.4%  Glycemic control for   <7.0% adults with diabetes   05/06/2018 04:18 PM 5.4 4.8 - 5.6 % Final    Comment:             Prediabetes: 5.7 - 6.4          Diabetes: >6.4          Glycemic control for adults with diabetes: <7.0     CBG: Recent Labs  Lab 08/09/22 1601 08/09/22 2005 08/09/22 2318 08/10/22 0418 08/10/22 0747  GLUCAP 107* 110* 130* 132* 119*    Critical care time:     Total critical care time: 32 minutes  Performed by: [place] care time was exclusive of separately billable procedures and treating other patients.   Critical care was necessary to treat or prevent imminent or life-threatening deterioration.   Critical care was time spent personally by me on the following activities: development of treatment plan with patient and/or surrogate as well as nursing, discussions with consultants, evaluation of patient's response to treatment, examination of patient, obtaining history from patient or surrogate, ordering and performing treatments and interventions, ordering and review of laboratory studies, ordering and review of radiographic studies, pulse oximetry  and re-evaluation of patient's condition.   Jacky Kindle, MD [facility] See Amion for pager If no response to pager, please call [phone] until 7pm After 7pm, Please call E-link [phone]

## 2022-08-10 NOTE — Evaluation (Signed)
Physical Therapy Evaluation Patient Details Name: Krista Fisher MRN: [id number] DOB: Dec 29, 1975 Today's Date: 08/10/2022  History of Present Illness  The pt is a 46 yo female presenting with abdominal pain, found to have strangulated internal hernia and adhesive bowel obstruction. S/p ex lap with reduction of internal hernia and small bowel resection on 8/18. PMH includes: anxiety, depression, and prior gastric sleeve in 2010 and a biliopancreatic diversion with duodenal switch in 2021.   Clinical Impression  Pt in bed upon arrival of PT, agreeable to evaluation at this time. Prior to admission the pt was independent with all mobility, working full time as an Therapist, sports, living alone in a ground-floor apt. The now presents with limitations in functional mobility and activity tolerance due to above dx and resulting pain, and will continue to benefit from skilled PT to address these deficits. The pt was able to complete bed mobility without physical assist at this time, but reports significant increase in pain and assist for line management. The pt was then able to complete multiple sit-stand transfers and short bouts of stepping (~36f) with use of RW but with only minG for safety and line management. Pt limited by pain and fatigue, but demos good stability with movements. Will continue to follow acutely to progress mobility and endurance, anticipate she will progress well and be able to return home with intermittent support from friends/family when medially ready for d/c.         Recommendations for follow up therapy are one component of a multi-disciplinary discharge planning process, led by the attending physician.  Recommendations may be updated based on patient status, additional functional criteria and insurance authorization.  Follow Up Recommendations Home health PT      Assistance Recommended at Discharge Intermittent Supervision/Assistance  Patient can return home with the following  A little  help with walking and/or transfers;A little help with bathing/dressing/bathroom;Assistance with cooking/housework;Direct supervision/assist for medications management;Direct supervision/assist for financial management;Assist for transportation;Help with stairs or ramp for entrance    Equipment Recommendations Rolling walker (2 wheels) (tub bench)  Recommendations for Other Services       Functional Status Assessment Patient has had a recent decline in their functional status and demonstrates the ability to make significant improvements in function in a reasonable and predictable amount of time.     Precautions / Restrictions Precautions Precautions: Fall;Other (comment) (abdominal) Precaution Comments: JP drain L side, NG tube to wall suction, PCA pump Restrictions Weight Bearing Restrictions: No      Mobility  Bed Mobility Overal bed mobility: Needs Assistance Bed Mobility: Supine to Sit, Sit to Supine     Supine to sit: Min guard Sit to supine: Min guard   General bed mobility comments: educated pt on log roll, pt then asking to attempt on her own and did not follow log roll technique. Pt able to complete without assist but does report significant pain, agreeable to attempt log roll at next session    Transfers Overall transfer level: Needs assistance Equipment used: Rolling walker (2 wheels) Transfers: Sit to/from Stand Sit to Stand: Min guard           General transfer comment: increased time to rise, maintains trunk flexed    Ambulation/Gait Ambulation/Gait assistance: Min guard Gait Distance (Feet): 4 Feet (+ 447fforwards) Assistive device: Rolling walker (2 wheels) Gait Pattern/deviations: Step-to pattern, Decreased stride length, Trunk flexed Gait velocity: decreased Gait velocity interpretation: <1.31 ft/sec, indicative of household ambulator   General Gait Details: small lateral steps  along EOB and then 5 steps forwards and back from EOB, limited by NG  tube. pt declined repeated attempts due to fatigue and pain     Balance Overall balance assessment: Mild deficits observed, not formally tested                                           Pertinent Vitals/Pain Pain Assessment Pain Assessment: 0-10 Pain Score: 7  Pain Location: incision, 7/10 prior to movement, 10/10 during movement Pain Descriptors / Indicators: Discomfort, Grimacing, Guarding Pain Intervention(s): Limited activity within patient's tolerance, Monitored during session, Repositioned, PCA encouraged    Home Living Family/patient expects to be discharged to:: Private residence Living Arrangements: Alone Available Help at Discharge: Family;Available 24 hours/day Type of Home: Apartment Home Access: Level entry       Home Layout: One level Home Equipment: None      Prior Function Prior Level of Function : Independent/Modified Independent;Working/employed;Driving             Mobility Comments: no DME needs, works as Therapist, sports ADLs Comments: independent     Journalist, newspaper   Dominant Hand: Right    Extremity/Trunk Assessment   Upper Extremity Assessment Upper Extremity Assessment: Overall WFL for tasks assessed    Lower Extremity Assessment Lower Extremity Assessment: Overall WFL for tasks assessed    Cervical / Trunk Assessment Cervical / Trunk Assessment: Other exceptions Cervical / Trunk Exceptions: abdominal surgery  Communication   Communication: No difficulties;Other (comment) (sore from ETT removal)  Cognition Arousal/Alertness: Awake/alert Behavior During Therapy: WFL for tasks assessed/performed Overall Cognitive Status: Within Functional Limits for tasks assessed                                 General Comments: slightly flat affect but able to answer all questions and follow cues appropriately        General Comments General comments (skin integrity, edema, etc.): VSS, pt using PCA pump in session.     Exercises     Assessment/Plan    PT Assessment Patient needs continued PT services  PT Problem List Decreased strength;Decreased range of motion;Decreased activity tolerance;Decreased mobility;Pain       PT Treatment Interventions DME instruction;Gait training;Stair training;Functional mobility training;Therapeutic activities;Therapeutic exercise;Balance training;Patient/family education    PT Goals (Current goals can be found in the Care Plan section)  Acute Rehab PT Goals Patient Stated Goal: return home, to independence PT Goal Formulation: With patient Time For Goal Achievement: 08/24/22 Potential to Achieve Goals: Good    Frequency Min 3X/week        AM-PAC PT "6 Clicks" Mobility  Outcome Measure Help needed turning from your back to your side while in a flat bed without using bedrails?: A Little Help needed moving from lying on your back to sitting on the side of a flat bed without using bedrails?: A Little Help needed moving to and from a bed to a chair (including a wheelchair)?: A Little Help needed standing up from a chair using your arms (e.g., wheelchair or bedside chair)?: A Little Help needed to walk in hospital room?: Total (unable to complete 20 ft at this time) Help needed climbing 3-5 steps with a railing? : A Lot 6 Click Score: 15    End of Session Equipment Utilized During Treatment: Gait belt;Oxygen Activity Tolerance: Patient  tolerated treatment well;Patient limited by fatigue;Patient limited by pain Patient left: in bed;with call bell/phone within reach;with family/visitor present Nurse Communication: Mobility status PT Visit Diagnosis: Other abnormalities of gait and mobility (R26.89);Pain Pain - part of body:  (abdomen)    Time: 4627-0350 PT Time Calculation (min) (ACUTE ONLY): 29 min   Charges:   PT Evaluation $PT Eval Moderate Complexity: 1 Mod PT Treatments $Therapeutic Activity: 8-22 mins        West Carbo, PT, DPT   Acute  Rehabilitation Department  Sandra Cockayne 08/10/2022, 2:50 PM

## 2022-08-10 NOTE — Progress Notes (Addendum)
[facility] Progress Note Patient Name: Krista Fisher DOB: 1976-06-16 MRN: [id number]   Date of Service  08/10/2022  HPI/Events of Note  Notified of rising temperature with current temp at 100.69F.  Pt is s/p EL for SBO in setting of strangulated hernia.  She was extubated yesterday.  She is currently receiving Zosyn.   BP 115/76, HR 114, RR 24, O2 sats 98%.   eICU Interventions  Obtain blood cultures. Repeat urinalysis and send urine culture. Tylenol suppository ordered.     Intervention Category Intermediate Interventions: Infection - evaluation and management  Elsie Lincoln 08/10/2022, 8:29 PM  10:49 PM UA reviewed - with no nitrites or leuk esterase.   Plan> Ok to hold off on urine culture.

## 2022-08-11 ENCOUNTER — Encounter (HOSPITAL_COMMUNITY): Payer: Self-pay | Admitting: Surgery

## 2022-08-11 ENCOUNTER — Inpatient Hospital Stay: Payer: Self-pay

## 2022-08-11 DIAGNOSIS — R0689 Other abnormalities of breathing: Secondary | ICD-10-CM

## 2022-08-11 DIAGNOSIS — K56609 Unspecified intestinal obstruction, unspecified as to partial versus complete obstruction: Secondary | ICD-10-CM | POA: Diagnosis not present

## 2022-08-11 LAB — CBC
HCT: 32 % — ABNORMAL LOW (ref 36.0–46.0)
Hemoglobin: 11.8 g/dL — ABNORMAL LOW (ref 12.0–15.0)
MCH: 27.1 pg (ref 26.0–34.0)
MCHC: 36.9 g/dL — ABNORMAL HIGH (ref 30.0–36.0)
MCV: 73.6 fL — ABNORMAL LOW (ref 80.0–100.0)
Platelets: 172 10*3/uL (ref 150–400)
RBC: 4.35 MIL/uL (ref 3.87–5.11)
RDW: 13.5 % (ref 11.5–15.5)
WBC: 17.9 10*3/uL — ABNORMAL HIGH (ref 4.0–10.5)
nRBC: 0 % (ref 0.0–0.2)

## 2022-08-11 LAB — BASIC METABOLIC PANEL
Anion gap: 6 (ref 5–15)
BUN: 11 mg/dL (ref 6–20)
CO2: 23 mmol/L (ref 22–32)
Calcium: 8.1 mg/dL — ABNORMAL LOW (ref 8.9–10.3)
Chloride: 111 mmol/L (ref 98–111)
Creatinine, Ser: 0.89 mg/dL (ref 0.44–1.00)
GFR, Estimated: >60 mL/min (ref >60)
Glucose, Bld: 104 mg/dL — ABNORMAL HIGH (ref 70–99)
Potassium: 4.6 mmol/L (ref 3.5–5.1)
Sodium: 140 mmol/L (ref 135–145)

## 2022-08-11 LAB — MAGNESIUM: Magnesium: 1.9 mg/dL (ref 1.7–2.4)

## 2022-08-11 LAB — GLUCOSE, CAPILLARY
Glucose-Capillary: 103 mg/dL — ABNORMAL HIGH (ref 70–99)
Glucose-Capillary: 117 mg/dL — ABNORMAL HIGH (ref 70–99)
Glucose-Capillary: 103 mg/dL — ABNORMAL HIGH (ref 70–99)
Glucose-Capillary: 98 mg/dL (ref 70–99)

## 2022-08-11 MED ORDER — DEXTROSE-NACL 5-0.45 % IV SOLN
INTRAVENOUS | Status: AC
Start: 2022-08-11 — End: 2022-08-11

## 2022-08-11 MED ORDER — TRAVASOL 10 % IV SOLN
INTRAVENOUS | Status: AC
  Filled 2022-08-11: qty 432

## 2022-08-11 MED ORDER — MAGNESIUM SULFATE 2 GM/50ML IV SOLN
2.0000 g | Freq: Once | INTRAVENOUS | Status: AC
  Administered 2022-08-11: 2 g via INTRAVENOUS
  Filled 2022-08-11: qty 50

## 2022-08-11 MED ORDER — PANTOPRAZOLE SODIUM 40 MG IV SOLR
40.0000 mg | INTRAVENOUS | Status: DC
  Administered 2022-08-11 – 2022-08-12 (×2): 40 mg via INTRAVENOUS
  Filled 2022-08-11 (×2): qty 10

## 2022-08-11 MED ORDER — SODIUM CHLORIDE 0.9% FLUSH
10.0000 mL | Freq: Two times a day (BID) | INTRAVENOUS | Status: DC
  Administered 2022-08-11 – 2022-08-14 (×3): 10 mL

## 2022-08-11 MED ORDER — SODIUM CHLORIDE 0.9% FLUSH: 10.0000 mL | INTRAVENOUS | Status: DC | PRN

## 2022-08-11 MED ORDER — DEXTROSE-NACL 5-0.45 % IV SOLN: INTRAVENOUS | Status: AC

## 2022-08-11 NOTE — Progress Notes (Signed)
Physical Therapy Treatment Patient Details Name: Krista Fisher MRN: [id number] DOB: 1976-04-09 Today's Date: 08/11/2022   History of Present Illness The pt is a 46 yo female presenting with abdominal pain, found to have strangulated internal hernia and adhesive bowel obstruction. S/p ex lap with reduction of internal hernia and small bowel resection on 8/18. PMH includes: anxiety, depression, and prior gastric sleeve in 2010 and a biliopancreatic diversion with duodenal switch in 2021.    PT Comments    The pt was agreeable to session and able to demo good progress with mobility at this time. She completed significantly improved ambulation distance at this time with VSS on RA, and reports less increase in pain compared to prior session. The pt remains highly motivated to progress, will continue to benefit from skilled PT intervention acutely to progress functional strength, endurance, and independence with transfers.     Recommendations for follow up therapy are one component of a multi-disciplinary discharge planning process, led by the attending physician.  Recommendations may be updated based on patient status, additional functional criteria and insurance authorization.  Follow Up Recommendations  Home health PT     Assistance Recommended at Discharge Intermittent Supervision/Assistance  Patient can return home with the following A little help with walking and/or transfers;A little help with bathing/dressing/bathroom;Assistance with cooking/housework;Direct supervision/assist for medications management;Direct supervision/assist for financial management;Assist for transportation;Help with stairs or ramp for entrance   Equipment Recommendations  Rolling walker (2 wheels) (tub bench)    Recommendations for Other Services       Precautions / Restrictions Precautions Precautions: Fall;Other (comment) (abdominal) Precaution Comments: JP drain L side, NG tube to wall suction, PCA  pump Restrictions Weight Bearing Restrictions: No     Mobility  Bed Mobility Overal bed mobility: Needs Assistance Bed Mobility: Supine to Sit, Sit to Supine     Supine to sit: Min assist Sit to supine: Min assist   General bed mobility comments: minA given after pt attempting, minA to elevate trunk only, minA to assist with return of LE to bed    Transfers Overall transfer level: Needs assistance Equipment used: Rolling walker (2 wheels) Transfers: Sit to/from Stand Sit to Stand: Min guard           General transfer comment: increased time to rise, maintains trunk flexed    Ambulation/Gait Ambulation/Gait assistance: Min guard Gait Distance (Feet): 15 Feet Assistive device: Rolling walker (2 wheels) Gait Pattern/deviations: Decreased stride length, Trunk flexed, Step-through pattern Gait velocity: decreased     General Gait Details: pt able to ambulate around end of bed and back, BUE support on RW with VSS on RA      Balance Overall balance assessment: Mild deficits observed, not formally tested                                          Cognition Arousal/Alertness: Awake/alert Behavior During Therapy: WFL for tasks assessed/performed Overall Cognitive Status: Within Functional Limits for tasks assessed                                 General Comments: slightly flat affect but able to answer all questions and follow cues appropriately        Exercises      General Comments General comments (skin integrity, edema, etc.): VSS on RA  Pertinent Vitals/Pain Pain Assessment Pain Location: incision, 7/10 prior to movement, 10/10 during movement Pain Descriptors / Indicators: Discomfort, Grimacing, Guarding Pain Intervention(s): Limited activity within patient's tolerance, Monitored during session, Repositioned     PT Goals (current goals can now be found in the care plan section) Acute Rehab PT Goals Patient Stated  Goal: return home, to independence PT Goal Formulation: With patient Time For Goal Achievement: 08/24/22 Potential to Achieve Goals: Good Progress towards PT goals: Progressing toward goals    Frequency    Min 3X/week      PT Plan Current plan remains appropriate       AM-PAC PT "6 Clicks" Mobility   Outcome Measure  Help needed turning from your back to your side while in a flat bed without using bedrails?: A Little Help needed moving from lying on your back to sitting on the side of a flat bed without using bedrails?: A Little Help needed moving to and from a bed to a chair (including a wheelchair)?: A Little Help needed standing up from a chair using your arms (e.g., wheelchair or bedside chair)?: A Little Help needed to walk in hospital room?: Total (unable to complete 20 ft at this time) Help needed climbing 3-5 steps with a railing? : A Lot 6 Click Score: 15    End of Session Equipment Utilized During Treatment: Gait belt Activity Tolerance: Patient tolerated treatment well;Patient limited by fatigue;Patient limited by pain Patient left: in bed;with call bell/phone within reach;with family/visitor present Nurse Communication: Mobility status PT Visit Diagnosis: Other abnormalities of gait and mobility (R26.89);Pain Pain - part of body:  (abdomen)     Time: 4970-2637 PT Time Calculation (min) (ACUTE ONLY): 23 min  Charges:  $Gait Training: 8-22 mins $Therapeutic Activity: 8-22 mins                     West Carbo, PT, DPT   Acute Rehabilitation Department   Sandra Cockayne 08/11/2022, 4:27 PM

## 2022-08-11 NOTE — Progress Notes (Signed)
Peripherally Inserted Central Catheter Placement  The IV Nurse has discussed with the patient and/or persons authorized to consent for the patient, the purpose of this procedure and the potential benefits and risks involved with this procedure.  The benefits include less needle sticks, lab draws from the catheter, and the patient may be discharged home with the catheter. Risks include, but not limited to, infection, bleeding, blood clot (thrombus formation), and puncture of an artery; nerve damage and irregular heartbeat and possibility to perform a PICC exchange if needed/ordered by physician.  Alternatives to this procedure were also discussed.  Bard Power PICC patient education guide, fact sheet on infection prevention and patient information card has been provided to patient /or left at bedside.    PICC Placement Documentation  PICC Double Lumen 09/38/18 Right Basilic 43 cm 1 cm (Active)  Indication for Insertion or Continuance of Line Administration of hyperosmolar/irritating solutions (i.e. TPN, Vancomycin, etc.) 08/11/22 1500  Exposed Catheter (cm) 1 cm 08/11/22 1500  Site Assessment Clean, Dry, Intact 08/11/22 1500  Lumen #1 Status Flushed;Saline locked;Blood return noted 08/11/22 1500  Lumen #2 Status Flushed;Saline locked;Blood return noted 08/11/22 1500  Dressing Type Transparent;Securing device 08/11/22 1500  Dressing Status Antimicrobial disc in place 08/11/22 1500  Dressing Intervention New dressing;Other (Comment) 08/11/22 1500  Dressing Change Due 08/18/22 08/11/22 1500       Krista Fisher 08/11/2022, 3:20 PM

## 2022-08-11 NOTE — Progress Notes (Signed)
[facility] Progress Note  3 Days Post-Op  Subjective: CC-  Comfortable this morning. No further nausea. No flatus or BM. Pain well controlled with PCA. Ambulated with PT yesterday.  Objective: Vital signs in last 24 hours: Temp:  [98.7 F (37.1 C)-100.3 F (37.9 C)] 99.3 F (37.4 C) (08/21 0800) Pulse Rate:  [78-114] 91 (08/21 1000) Resp:  [0-40] 25 (08/21 1000) BP: (115-146)/(68-87) 146/78 (08/21 1000) SpO2:  [92 %-100 %] 98 % (08/21 1000) FiO2 (%):  [21 %] 21 % (08/20 1542) Last BM Date :  (PTA)  Intake/Output from previous day: 08/20 0701 - 08/21 0700 In: 2033 [I.V.:1840.8; IV Piggyback:192.2] Out: 2683 [Urine:1450; Drains:400] Intake/Output this shift: Total I/O In: 159.1 [I.V.:150.1; IV Piggyback:9] Out: 205 [Urine:175; Drains:30]  PE: Gen:  Alert, NAD, pleasant Card:  RRR, HR 80s Pulm:  rate and effort normal Abd: Soft, ND, appropriately tender at incisions, midline incision with honeycomb and staples present and no erythema or drainage, some serosanguinous drainage from distal aspect where penrose is located  Lab Results:  Recent Labs    08/10/22 0245 08/11/22 0701  WBC 18.1* 17.9*  HGB 13.7 11.8*  HCT 38.9 32.0*  PLT 201 172   BMET Recent Labs    08/10/22 0245 08/11/22 0701  NA 138 140  K 4.3 4.6  CL 110 111  CO2 21* 23  GLUCOSE 125* 104*  BUN 13 11  CREATININE 1.07* 0.89  CALCIUM 8.3* 8.1*   PT/INR No results for input(s): "LABPROT", "INR" in the last 72 hours. CMP     Component Value Date/Time   NA 140 08/11/2022 0701   NA 141 05/06/2018 1531   K 4.6 08/11/2022 0701   CL 111 08/11/2022 0701   CO2 23 08/11/2022 0701   GLUCOSE 104 (H) 08/11/2022 0701   BUN 11 08/11/2022 0701   BUN 11 05/06/2018 1531   CREATININE 0.89 08/11/2022 0701   CALCIUM 8.1 (L) 08/11/2022 0701   PROT 7.6 08/08/2022 0359   PROT 6.9 05/06/2018 1531   ALBUMIN 4.0 08/08/2022 0359   ALBUMIN 3.8 05/06/2018 1531   AST 50 (H) 08/08/2022 0359   ALT 33  08/08/2022 0359   ALKPHOS 149 (H) 08/08/2022 0359   BILITOT 0.6 08/08/2022 0359   BILITOT 0.2 05/06/2018 1531   GFRNONAA >60 08/11/2022 0701   GFRAA >60 09/06/2020 1242   Lipase     Component Value Date/Time   LIPASE 33 08/08/2022 0359       Studies/Results: No results found.  Anti-infectives: Anti-infectives (From admission, onward)    Start     Dose/Rate Route Frequency Ordered Stop   08/08/22 2200  piperacillin-tazobactam (ZOSYN) IVPB 3.375 g        3.375 g 12.5 mL/hr over 240 Minutes Intravenous Every 8 hours 08/08/22 1757 08/11/22 0152   08/08/22 1145  ceFAZolin (ANCEF) IVPB 2g/100 mL premix        2 g 200 mL/hr over 30 Minutes Intravenous To Surgery 08/08/22 1138 08/08/22 1400        Assessment/Plan  POD#3 S/p Diagnostic laparoscopy converted to exploratory laparotomy; Reduction of strangulated internal hernia (retro-roux defect); Small bowel resection (hepatobiliary limb from a few centimeters distal to the ligament of treitz to the small bowel anastomosis); Side to side, functionally end to end, stapled small bowel anastomosis (revision of old small bowel anastomosis); Omega loop hand sewn small bowel anastomosis (hepatobiliary limb to the alimentary limb) 8/18 Dr. Thermon Leyland - Pt s/p gastric sleeve [facility] 2010; s/p biliopancreatic diversion with  duodenal switch procedure, at [facility] [place] 04/03/2020 - Continue NPO/NGT to LIWS and await return in bowel function. Will order PICC/TPN today - mobilize, continue therapies - recommending HH PT - pulm toilet, IS - transfer to 4NP  ID - zosyn 8/18>>8/21. Ucx and Bcx pending FEN - IVF, NPO/NGT to LIWS. Start TPN VTE - lovenox Foley - d/c foley 8/21  VDRF - extubated 8/19, CCM s/o 8/21 AKI - Cr 0.89, improved Depression, Anxiety - resume home meds when taking PO GERD   LOS: 3 days    Wellington Hampshire, [facility] 08/11/2022, 10:41 AM Please see Amion for pager number during day hours  7:00am-4:30pm

## 2022-08-11 NOTE — Progress Notes (Signed)
NAME:  Krista Fisher, MRN:  [id number], DOB:  1976-11-25, LOS: 3 ADMISSION DATE:  08/08/2022, CONSULTATION DATE:  8/18   REFERRING MD: Dr. Thermon Leyland, CHIEF COMPLAINT: Abdominal pain  History of Present Illness:  46 year old female who presented to [facility] ER on 8/18 with reports of abdominal pain  She has a past medical history of sleeve gastrectomy at [facility] in 2012, anxiety and depression.  The patient initially lost weight but then unfortunately regained her weight.  In 2021, she was evaluated at [facility] and underwent a biliopancreatic diversion with duodenal switch.  Since that time she has lost approximately 80 to 90 pounds.  The patient reports after surgery she intermittently has gas pain which is relieved by Gas-X.  However on the evening prior to presentation she took Gas-X and it did not relieve her pain.  Her abdominal pain was reported as a constant cramp with intermittent severe sharp pains.  She had associated nausea and vomiting.  Emesis was nonbloody per report.  However in the emergency room she was noted to have some red tinge-it was unclear if this was blood versus Gas-X which is red.  She had a bowel movement prior to presentation which did not resolve her abdominal pain.  She presented to the emergency department on 8/18 for evaluation in the setting of persistent pain.  Initial lab work notable for lactate of 5.1, WBC 11.1, Hgb 15.2.  CMP with CO2 of 17, glucose 119, alk phos 149, AST 50.  CT of the abdomen showed concern for strangulated internal hernia, adhesive bowel obstruction.  No mesenteric arterial occlusive disease noted.  Patient was admitted per general surgery.  Given her symptoms conservative management was deferred and she was recommended for diagnostic laparoscopically and possible exploratory laparotomy.  Patient was taken urgently to the operating room per Dr. Thermon Leyland.    PCCM consulted for post-operative ICU evaluation and management  Pertinent  Medical  History  Laparoscopic sleeve gastrectomy - 02/2009 per Dr. Toney Rakes at [facility] Duodenal switch - 03/2020 per Dr. [place] at [facility] Events: Including procedures, antibiotic start and stop dates in addition to other pertinent events   8/18 Admit with abd pain in setting of strangulated internal hernia, adhesive bowel obstruction. To OR.   Interim History / Subjective:  Heart rate is better controlled Tmax 100.3 No flatus or bowel movement  Objective   Blood pressure 137/76, pulse 80, temperature 98.9 F (37.2 C), temperature source Oral, resp. rate (!) 26, height '5\' 6"'  (1.676 m), weight 127.5 kg, SpO2 100 %.    FiO2 (%):  [21 %] 21 %   Intake/Output Summary (Last 24 hours) at 08/11/2022 0824 Last data filed at 08/11/2022 0700 Gross per 24 hour  Intake 1945.65 ml  Output 1850 ml  Net 95.65 ml   Filed Weights   08/08/22 0325  Weight: 127.5 kg    Examination: Physical exam: General: Acutely ill-appearing morbidly obese female, lying on the bed HEENT: [place]/AT, eyes anicteric.  moist mucus membranes.  NGT in place Neuro: Alert, awake following commands Chest: Coarse breath sounds, no wheezes or rhonchi Heart: Regular rate and rhythm, no murmurs or gallops Abdomen: Soft, diffusely tender, nondistended, absent bowel sounds Skin: No rash   Resolved Hospital Problem list   Acute respiratory insufficiency, postop Acute metabolic acidosis/lactic acidosis  Assessment & Plan:  SBO in setting of Strangulated Hernia, caused by adhesions S/p Exploratory Laparotomy Hx Duodenal Switch  Intra-op findings of strangulated internal hernia /  retro-roux defect, small bowel resection Post operative care per CCS NPO, no meds per tube until cleared by CCS, defer timing of feeding to surgery  Continue D5 half normal saline for now Continue NGT to low intermittent wall suction Continue fentanyl PCA for pain control as she did not tolerate Dilaudid PCA due to  nausea She is still not passing gas, bowel functions has not returned yet  Depression, Anxiety  Benzodiazepine Dependent  Hold home wellbutrin, lexapro, xanax, lamictal until taking PO's   PCCM will sign off, please call with questions  Best Practice (right click and "Reselect all SmartList Selections" daily)  Diet/type: NPO DVT prophylaxis: LMWH GI prophylaxis: PPI Lines: N/A Foley:  Yes, and it is still needed Code Status:  full code Last date of multidisciplinary goals of care discussion: full code.  8/18 mother called for update from ICU perspective.  Labs   CBC: Recent Labs  Lab 08/08/22 0359 08/08/22 1524 08/08/22 1640 08/08/22 1841 08/09/22 0425 08/10/22 0245 08/11/22 0701  WBC 11.4*  --   --   --  14.8* 18.1* 17.9*  NEUTROABS 9.1*  --   --   --   --   --   --   HGB 15.2*   < > 13.9 15.0 15.4* 13.7 11.8*  HCT 45.5   < > 41.0 44.0 44.5 38.9 32.0*  MCV 79.1*  --   --   --  76.5* 75.7* 73.6*  PLT 237  --   --   --  198 201 172   < > = values in this interval not displayed.    Basic Metabolic Panel: Recent Labs  Lab 08/08/22 0359 08/08/22 1524 08/08/22 1838 08/08/22 1841 08/09/22 0425 08/10/22 0245 08/11/22 0701  NA 142   < > 140 140 143 138 140  K 3.9   < > 4.4 4.2 5.0 4.3 4.6  CL 112*  --  115*  --  113* 110 111  CO2 17*  --  18*  --  20* 21* 23  GLUCOSE 119*  --  163*  --  129* 125* 104*  BUN 14  --  13  --  '16 13 11  ' CREATININE 1.00  --  0.78  --  1.02* 1.07* 0.89  CALCIUM 9.7  --  7.9*  --  8.1* 8.3* 8.1*  MG  --   --  1.4*  --   --  1.8 1.9  PHOS  --   --  5.5*  --   --  3.1  --    < > = values in this interval not displayed.   GFR: Estimated Creatinine Clearance: 108 mL/min (by C-G formula based on SCr of 0.89 mg/dL). Recent Labs  Lab 08/08/22 0359 08/08/22 1838 08/08/22 2210 08/09/22 0425 08/10/22 0245 08/11/22 0701  WBC 11.4*  --   --  14.8* 18.1* 17.9*  LATICACIDVEN 5.1* 2.6* 5.7* 3.3*  --   --     Liver Function Tests: Recent  Labs  Lab 08/08/22 0359  AST 50*  ALT 33  ALKPHOS 149*  BILITOT 0.6  PROT 7.6  ALBUMIN 4.0   Recent Labs  Lab 08/08/22 0359  LIPASE 33   No results for input(s): "AMMONIA" in the last 168 hours.  ABG    Component Value Date/Time   PHART 7.339 (L) 08/08/2022 1841   PCO2ART 42.1 08/08/2022 1841   PO2ART 580 (H) 08/08/2022 1841   HCO3 22.8 08/08/2022 1841   TCO2 24 08/08/2022 1841   ACIDBASEDEF  3.0 (H) 08/08/2022 1841   O2SAT 100 08/08/2022 1841     Coagulation Profile: No results for input(s): "INR", "PROTIME" in the last 168 hours.  Cardiac Enzymes: No results for input(s): "CKTOTAL", "CKMB", "CKMBINDEX", "TROPONINI" in the last 168 hours.  HbA1C: Hgb A1c MFr Bld  Date/Time Value Ref Range Status  08/08/2022 06:38 PM 5.0 4.8 - 5.6 % Final    Comment:    (NOTE) Pre diabetes:          5.7%-6.4%  Diabetes:              >6.4%  Glycemic control for   <7.0% adults with diabetes   05/06/2018 04:18 PM 5.4 4.8 - 5.6 % Final    Comment:             Prediabetes: 5.7 - 6.4          Diabetes: >6.4          Glycemic control for adults with diabetes: <7.0     CBG: Recent Labs  Lab 08/10/22 1539 08/10/22 1930 08/10/22 2311 08/11/22 0318 08/11/22 0748  GLUCAP 94 120* 124* 103* 117*       Jacky Kindle, MD [facility] See Amion for pager If no response to pager, please call [phone] until 7pm After 7pm, Please call E-link [phone]

## 2022-08-11 NOTE — Progress Notes (Addendum)
PHARMACY - TOTAL PARENTERAL NUTRITION CONSULT NOTE   Indication: Small bowel obstruction s/p small bowel resection   Patient Measurements: Height: '5\' 6"'$  (167.6 cm) Weight: 127.5 kg (281 lb 1.4 oz) IBW/kg (Calculated) : 59.3 TPN AdjBW (KG): 76.3 Body mass index is 45.37 kg/m. Usual Weight: 127.5kg   Assessment: Krista Fisher is a 46 year old female with a PMH of sleeve gastrectomy (2012), anxiety, and depression. Current admission is for a small bowel obstruction that patient underwent an ex-lap for (see below for details). Patient has been NPO since 8/18 however surgery expects short gut and nutrition issues long-term secondary to the patient's surgical history. Additionally, the patient has had no flatus or bowel movements since the procedure on 8/18.   Glucose / Insulin: Current BG is < 130, A1C: 5.0 Electrolytes: Mag: 1.9, calcium: 8.1, K: 4.6, Na: 140  Renal: Normal, Scr: 0.89, CrCl: 14m/min  Hepatic: Normal, AST/ALT: 50/33  Intake / Output; MIVF: D5-0.45% NaCl 766mhr, -[id number]  GI Imaging: 8/18: CT abdomen, Distal small bowel obstruction GI Surgeries / Procedures:  08/08/2022: ex-lap, reduction of strangulated internal hernia, small bowel resection 8/13:2021: biliopancreatic diversion with duodenal switch  2012: sleeve gastrectomy   Central access: PICC line being placed on 8/21  TPN start date: 8/21    Nutritional Goals: Goal TPN rate is 95 mL/hr (provides 93 g of protein and 1925 kcals per day)  RD Assessment: Pending, consult has been placed  Pharmacy assessment of macros:  Total requirements: 1950kcal/day, 2310m81mintenance fluid  93g protein, 396kcal  64g lipid, 577kcal  287g carbs, 978kcal      Current Nutrition:  NPO  Plan:  Start TPN at 45mL63mat 1800, will provide 43.2 g of protein and 915kcal.  Electrolytes in TPN: Na 50mEq72mK 50mEq/45ma 5mEq/L,72m 7mEq/L, 21m Phos 15mmol/L.82mAc 1:1 Increased from standard magnesium due to trend in low  magnesium requiring replacement in the past 2-3 days.  Add standard MVI and trace elements to TPN Initiate Sensitive q4h SSI and adjust as needed  Reduce MIVF to 30 mL/hr at 1800 Monitor TPN labs on Mon/Thurs.   Sigfredo Schreier W Ventura Sellers,12:37 PM

## 2022-08-11 NOTE — Progress Notes (Signed)
Attempted to call mother Mardene Celeste) to inform her of patient room change. There was no answer. I left a voicemail saying that the patient had been moved as prior discussed and to call the unit back for more information.  08/11/2022 Mike Gip, RN 4:19 PM

## 2022-08-12 LAB — BASIC METABOLIC PANEL
Anion gap: 5 (ref 5–15)
BUN: 9 mg/dL (ref 6–20)
CO2: 25 mmol/L (ref 22–32)
Calcium: 7.9 mg/dL — ABNORMAL LOW (ref 8.9–10.3)
Chloride: 110 mmol/L (ref 98–111)
Creatinine, Ser: 0.75 mg/dL (ref 0.44–1.00)
GFR, Estimated: >60 mL/min (ref >60)
Glucose, Bld: 123 mg/dL — ABNORMAL HIGH (ref 70–99)
Potassium: 3.6 mmol/L (ref 3.5–5.1)
Sodium: 140 mmol/L (ref 135–145)

## 2022-08-12 LAB — GLUCOSE, CAPILLARY
Glucose-Capillary: 106 mg/dL — ABNORMAL HIGH (ref 70–99)
Glucose-Capillary: 107 mg/dL — ABNORMAL HIGH (ref 70–99)
Glucose-Capillary: 114 mg/dL — ABNORMAL HIGH (ref 70–99)
Glucose-Capillary: 117 mg/dL — ABNORMAL HIGH (ref 70–99)
Glucose-Capillary: 121 mg/dL — ABNORMAL HIGH (ref 70–99)
Glucose-Capillary: 125 mg/dL — ABNORMAL HIGH (ref 70–99)
Glucose-Capillary: 129 mg/dL — ABNORMAL HIGH (ref 70–99)
Glucose-Capillary: 133 mg/dL — ABNORMAL HIGH (ref 70–99)

## 2022-08-12 LAB — CBC
HCT: 28.4 % — ABNORMAL LOW (ref 36.0–46.0)
Hemoglobin: 10.2 g/dL — ABNORMAL LOW (ref 12.0–15.0)
MCH: 26.6 pg (ref 26.0–34.0)
MCHC: 35.9 g/dL (ref 30.0–36.0)
MCV: 74 fL — ABNORMAL LOW (ref 80.0–100.0)
Platelets: 197 10*3/uL (ref 150–400)
RBC: 3.84 MIL/uL — ABNORMAL LOW (ref 3.87–5.11)
RDW: 13.5 % (ref 11.5–15.5)
WBC: 13.3 10*3/uL — ABNORMAL HIGH (ref 4.0–10.5)
nRBC: 0 % (ref 0.0–0.2)

## 2022-08-12 LAB — PHOSPHORUS: Phosphorus: 2.3 mg/dL — ABNORMAL LOW (ref 2.5–4.6)

## 2022-08-12 LAB — TRIGLYCERIDES: Triglycerides: 58 mg/dL (ref <150)

## 2022-08-12 LAB — URINE CULTURE: Culture: NO GROWTH

## 2022-08-12 LAB — MAGNESIUM: Magnesium: 1.9 mg/dL (ref 1.7–2.4)

## 2022-08-12 LAB — SURGICAL PATHOLOGY

## 2022-08-12 MED ORDER — TRAVASOL 10 % IV SOLN
INTRAVENOUS | Status: AC
  Filled 2022-08-12: qty 912

## 2022-08-12 MED ORDER — OXYCODONE HCL 5 MG PO TABS: 5.0000 mg | ORAL_TABLET | ORAL | Status: DC | PRN

## 2022-08-12 MED ORDER — POTASSIUM CHLORIDE 20 MEQ PO PACK
40.0000 meq | PACK | Freq: Once | ORAL | Status: AC
Start: 2022-08-12 — End: 2022-08-12
  Administered 2022-08-12: 40 meq
  Filled 2022-08-12: qty 2

## 2022-08-12 MED ORDER — ALPRAZOLAM 0.25 MG PO TABS
0.5000 mg | ORAL_TABLET | Freq: Two times a day (BID) | ORAL | Status: DC | PRN
Start: 2022-08-12 — End: 2022-08-15
  Administered 2022-08-13: 0.5 mg via ORAL
  Filled 2022-08-12: qty 2

## 2022-08-12 MED ORDER — FLUTICASONE PROPIONATE 50 MCG/ACT NA SUSP
1.0000 | Freq: Every day | NASAL | Status: DC
  Administered 2022-08-12 – 2022-08-14 (×3): 1 via NASAL
  Filled 2022-08-12: qty 16

## 2022-08-12 MED ORDER — ESCITALOPRAM OXALATE 10 MG PO TABS
20.0000 mg | ORAL_TABLET | Freq: Every day | ORAL | Status: DC
  Administered 2022-08-12 – 2022-08-14 (×3): 20 mg via ORAL
  Filled 2022-08-12 (×3): qty 2

## 2022-08-12 MED ORDER — METHOCARBAMOL 1000 MG/10ML IJ SOLN
500.0000 mg | Freq: Three times a day (TID) | INTRAVENOUS | Status: DC
  Administered 2022-08-12 – 2022-08-13 (×4): 500 mg via INTRAVENOUS
  Filled 2022-08-12: qty 500
  Filled 2022-08-12: qty 5
  Filled 2022-08-12: qty 500
  Filled 2022-08-12 (×3): qty 5

## 2022-08-12 MED ORDER — OYSTER SHELL CALCIUM/D3 500-5 MG-MCG PO TABS
1.0000 | ORAL_TABLET | Freq: Every day | ORAL | Status: DC
  Administered 2022-08-12 – 2022-08-13 (×2): 1 via ORAL
  Filled 2022-08-12 (×2): qty 1

## 2022-08-12 MED ORDER — ACETAMINOPHEN 325 MG PO TABS
650.0000 mg | ORAL_TABLET | Freq: Four times a day (QID) | ORAL | Status: DC
  Administered 2022-08-12 – 2022-08-14 (×10): 650 mg via ORAL
  Filled 2022-08-12 (×10): qty 2

## 2022-08-12 NOTE — Progress Notes (Signed)
PHARMACY - TOTAL PARENTERAL NUTRITION CONSULT NOTE   Indication: Small bowel obstruction s/p small bowel resection   Patient Measurements: Height: '5\' 6"'$  (167.6 cm) Weight: 127.8 kg (281 lb 12 oz) IBW/kg (Calculated) : 59.3 TPN AdjBW (KG): 76.3 Body mass index is 45.48 kg/m. Usual Weight: 127.5kg   Assessment: Krista Fisher is a 46 year old female with a PMH of sleeve gastrectomy (2012), anxiety, and depression. Current admission is for a small bowel obstruction that patient underwent an ex-lap for (see below for details). Patient has been NPO since 8/18 however surgery expects short gut and nutrition issues long-term secondary to the patient's surgical history. Overnight the patient has passed 1 large bowel movement and passing flatus. NG with minimal output, still not having reliable diet.   Glucose / Insulin: <180, required 1 unit SSI, A1C: 5.0 Electrolytes: Mag: 1.9, calcium: 7.9, K: 3.6, Na: 140, phos: 2.3   Renal: Normal, Scr: 0.89, CrCl: 189m/min  Hepatic: Normal, AST/ALT: 50/33  Intake / Output; MIVF: D5-0.45% NaCl 337mhr, -4006mut  GI Imaging: 8/18: CT abdomen, Distal small bowel obstruction GI Surgeries / Procedures:  08/08/2022: ex-lap, reduction of strangulated internal hernia, small bowel resection 8/13:2021: biliopancreatic diversion with duodenal switch  2012: sleeve gastrectomy   Central access: Double lumen PICC 8/21  TPN start date: 8/21    Nutritional Goals: Goal TPN rate is 95 mL/hr (provides 93 g of protein and 1925 kcals per day)  RD Assessment: Pending, consult has been placed  Pharmacy assessment of macros:  Total requirements: 1950kcal/day, 2310m21mintenance fluid  93g protein, 396kcal  64g lipid, 577kcal  287g carbs, 978kcal      Current Nutrition:  Clear bariatric liquid   Plan:  Increase TPN to goal rate 95mL64mat 1800, will provide 92 g of protein and 1935kcal.  Electrolytes in TPN: Na 35mEq31mK 56mEq/45ma 5mEq/L,19m 8mEq/L, 62m Phos  19mmol/L.29mAc 1:1 Increased from standard magnesium due to trend in low magnesium requiring replacement in the past 2-3 days.  Increased potassium and phosphorous slightly due to downward trend in electrolytes.  Continue standard MVI and trace elements to TPN Continue Sensitive q4h SSI and adjust as needed  Discontinue MIVF at 1800 Monitor TPN labs on Mon/Thurs.   Eriyanna Kofoed W Ventura Sellers,8:55 AM

## 2022-08-12 NOTE — Progress Notes (Signed)
Initial Nutrition Assessment  DOCUMENTATION CODES:   Morbid obesity  INTERVENTION:   TPN to meet nutrition needs  Diet advancement; supplementation as appropriate; monitor stool output   NUTRITION DIAGNOSIS:   Increased nutrient needs related to post-op healing as evidenced by estimated needs.  GOAL:   Patient will meet greater than or equal to 90% of their needs  MONITOR:   Diet advancement, I & O's, Labs  REASON FOR ASSESSMENT:   Consult New TPN/TNA  ASSESSMENT:   Pt with PMH of anxiety, depression, GERD, s/p gastric sleeve 2010 (initially lost weight but regained her weight), s/p biliopancreatic diversion with duodenal switch 2021 and has lost 80-90 lb now admitted with abd pain and SBO in previous bariatric surgery.   Pt unavailable at time of visit.   8/18 s/p Diagnostic laparoscopy converted to exploratory laparotomy; Reduction of strangulated internal hernia (retro-roux defect); Small bowel resection (hepatobiliary limb from a few centimeters distal to the ligament of treitz to the small bowel anastomosis); Side to side, functionally end to end, stapled small bowel anastomosis (revision of old small bowel anastomosis); Omega loop hand sewn small bowel anastomosis (hepatobiliary limb to the alimentary limb) 8/19 extubated  8/21 TPN initiated @ 45 ml/hr 8/22 TPN advanced to goal @ 95 ml/hr; NG tube d/c'ed and started on Bariatric clear liquids   Medications reviewed and include: oscal with D 500 mg daily, SSI, protonix Phenergan   Labs reviewed: PO4: 2.3   L abd JP drain: 250 ml    Diet Order:   Diet Order             Diet bariatric clear liquid Room service appropriate? Yes; Fluid consistency: Thin  Diet effective now                   EDUCATION NEEDS:   Not appropriate for education at this time  Skin:  Skin Assessment: Reviewed RN Assessment  Last BM:  8/22 large; type 6  Height:   Ht Readings from Last 1 Encounters:  08/08/22 '5\' 6"'$   (1.676 m)    Weight:   Wt Readings from Last 1 Encounters:  08/11/22 127.8 kg    BMI:  Body mass index is 45.48 kg/m.  Estimated Nutritional Needs:   Kcal:  2000-2200  Protein:  105-115 grams  Fluid:  >2 L/day  Lockie Pares., RD, LDN, CNSC See AMiON for contact information

## 2022-08-12 NOTE — Progress Notes (Signed)
Physical Therapy Treatment Patient Details Name: Krista Fisher MRN: [id number] DOB: Feb 09, 1976 Today's Date: 08/12/2022   History of Present Illness The pt is a 46 yo female presenting with abdominal pain, found to have strangulated internal hernia and adhesive bowel obstruction. S/p ex lap with reduction of internal hernia and small bowel resection on 8/18. PMH includes: anxiety, depression, and prior gastric sleeve in 2010 and a biliopancreatic diversion with duodenal switch in 2021.    PT Comments    Patient progressing well towards PT goals. Session focused on progressive ambulation and transfers. Improved ambulation distance today with Min guard assist for safety. Pt with 1 LOB in bathroom but able to recover self. Min A needed to rise and steady in standing. VSS on RA throughout. Encouraged increasing activity. Pain seems better controlled today. Will continue to follow and progress as tolerated.   Recommendations for follow up therapy are one component of a multi-disciplinary discharge planning process, led by the attending physician.  Recommendations may be updated based on patient status, additional functional criteria and insurance authorization.  Follow Up Recommendations  No PT follow up     Assistance Recommended at Discharge Intermittent Supervision/Assistance  Patient can return home with the following A little help with walking and/or transfers;A little help with bathing/dressing/bathroom;Assistance with cooking/housework;Direct supervision/assist for medications management;Direct supervision/assist for financial management;Assist for transportation;Help with stairs or ramp for entrance   Equipment Recommendations  Rolling walker (2 wheels);Other (comment) (tub bench)    Recommendations for Other Services       Precautions / Restrictions Precautions Precautions: Fall;Other (comment) Precaution Comments: JP drain L side, PCA pump Restrictions Weight Bearing  Restrictions: No     Mobility  Bed Mobility Overal bed mobility: Needs Assistance Bed Mobility: Rolling, Sidelying to Sit Rolling: Min guard Sidelying to sit: Min guard, HOB elevated       General bed mobility comments: Increased time, use of rail to elevate trunk. Cues for log roll.    Transfers Overall transfer level: Needs assistance Equipment used: Rolling walker (2 wheels) Transfers: Sit to/from Stand Sit to Stand: Min assist, Min guard           General transfer comment: Min A to power to standing from EOB x1, from toilet x1, transferred to chair post ambulation.    Ambulation/Gait Ambulation/Gait assistance: Min guard Gait Distance (Feet): 150 Feet Assistive device: Rolling walker (2 wheels) Gait Pattern/deviations: Decreased stride length, Trunk flexed, Step-through pattern Gait velocity: decreased Gait velocity interpretation: <1.31 ft/sec, indicative of household ambulator   General Gait Details: SLow, guarded gait with decreased step lengths bilaterally. 1 standing rest break. 1 LOB in bathroom, able to recover. VSS on RA.   Stairs             Wheelchair Mobility    Modified Rankin (Stroke Patients Only)       Balance Overall balance assessment: Needs assistance Sitting-balance support: Feet supported, No upper extremity supported Sitting balance-Leahy Scale: Good Sitting balance - Comments: Able to perform pericare without assist   Standing balance support: During functional activity Standing balance-Leahy Scale: Fair Standing balance comment: Needs at least 1 UE support for standing balance esp dynamically, LOB in bathroom able to recover.                            Cognition Arousal/Alertness: Awake/alert Behavior During Therapy: WFL for tasks assessed/performed Overall Cognitive Status: Within Functional Limits for tasks assessed  Exercises      General  Comments General comments (skin integrity, edema, etc.): VSS on RA. Kept on RA at end of session.      Pertinent Vitals/Pain Pain Assessment Pain Assessment: 0-10 Pain Score: 4  Pain Location: abdomen Pain Descriptors / Indicators: Discomfort, Grimacing, Guarding Pain Intervention(s): Monitored during session, PCA encouraged, Repositioned    Home Living                          Prior Function            PT Goals (current goals can now be found in the care plan section) Progress towards PT goals: Progressing toward goals    Frequency    Min 3X/week      PT Plan Current plan remains appropriate    Co-evaluation              AM-PAC PT "6 Clicks" Mobility   Outcome Measure  Help needed turning from your back to your side while in a flat bed without using bedrails?: A Little Help needed moving from lying on your back to sitting on the side of a flat bed without using bedrails?: A Little Help needed moving to and from a bed to a chair (including a wheelchair)?: A Little Help needed standing up from a chair using your arms (e.g., wheelchair or bedside chair)?: A Little Help needed to walk in hospital room?: A Little Help needed climbing 3-5 steps with a railing? : A Lot 6 Click Score: 17    End of Session   Activity Tolerance: Patient tolerated treatment well Patient left: in chair;with call bell/phone within reach;with family/visitor present Nurse Communication: Mobility status PT Visit Diagnosis: Other abnormalities of gait and mobility (R26.89);Pain Pain - part of body:  (abdomen)     Time: 9794-8016 PT Time Calculation (min) (ACUTE ONLY): 31 min  Charges:  $Gait Training: 8-22 mins $Therapeutic Activity: 8-22 mins                     Marisa Severin, PT, DPT Acute Rehabilitation Services Secure chat preferred Office [place] 08/12/2022, 9:33 AM

## 2022-08-12 NOTE — Progress Notes (Signed)
[facility] Progress Note  4 Days Post-Op  Subjective: CC-  Abdominal pain well controlled. Passing flatus and had a large BM this morning, mostly loose but somewhat formed. NG with minimal output. Foley out yesterday, voiding without issues. Denies dysuria. She does have a mild cough, thinks it's from NG irritation. Coughing up a little phlegm.  Objective: Vital signs in last 24 hours: Temp:  [99 F (37.2 C)-100.1 F (37.8 C)] 99.4 F (37.4 C) (08/22 0353) Pulse Rate:  [81-115] 93 (08/22 0753) Resp:  [15-26] 22 (08/22 0754) BP: (115-146)/(59-79) 115/59 (08/22 0353) SpO2:  [94 %-100 %] 97 % (08/22 0754) FiO2 (%):  [21 %-100 %] 21 % (08/22 0401) Weight:  [127.8 kg] 127.8 kg (08/21 1136) Last BM Date :  (PTA)  Intake/Output from previous day: 08/21 0701 - 08/22 0700 In: 1416.2 [I.V.:1366.2; IV Piggyback:50] Out: 660 [Urine:400; Emesis/NG output:10; Drains:250] Intake/Output this shift: No intake/output data recorded.  PE: Gen:  Alert, NAD, pleasant Card:  RRR, HR 80s Pulm:  CTAB, no wheezing, rate and effort normal, pulling 1000 on IS Abd: Soft, ND, appropriately tender at incisions, midline incision with honeycomb and staples present and no erythema or drainage, some serosanguinous drainage from distal aspect where penrose is located  Lab Results:  Recent Labs    08/11/22 0701 08/12/22 0228  WBC 17.9* 13.3*  HGB 11.8* 10.2*  HCT 32.0* 28.4*  PLT 172 197   BMET Recent Labs    08/11/22 0701 08/12/22 0228  NA 140 140  K 4.6 3.6  CL 111 110  CO2 23 25  GLUCOSE 104* 123*  BUN 11 9  CREATININE 0.89 0.75  CALCIUM 8.1* 7.9*   PT/INR No results for input(s): "LABPROT", "INR" in the last 72 hours. CMP     Component Value Date/Time   NA 140 08/12/2022 0228   NA 141 05/06/2018 1531   K 3.6 08/12/2022 0228   CL 110 08/12/2022 0228   CO2 25 08/12/2022 0228   GLUCOSE 123 (H) 08/12/2022 0228   BUN 9 08/12/2022 0228   BUN 11 05/06/2018 1531    CREATININE 0.75 08/12/2022 0228   CALCIUM 7.9 (L) 08/12/2022 0228   PROT 7.6 08/08/2022 0359   PROT 6.9 05/06/2018 1531   ALBUMIN 4.0 08/08/2022 0359   ALBUMIN 3.8 05/06/2018 1531   AST 50 (H) 08/08/2022 0359   ALT 33 08/08/2022 0359   ALKPHOS 149 (H) 08/08/2022 0359   BILITOT 0.6 08/08/2022 0359   BILITOT 0.2 05/06/2018 1531   GFRNONAA >60 08/12/2022 0228   GFRAA >60 09/06/2020 1242   Lipase     Component Value Date/Time   LIPASE 33 08/08/2022 0359       Studies/Results: [place] EKG SITE RITE  Result Date: 08/11/2022 If Site Rite image not attached, placement could not be confirmed due to current cardiac rhythm.   Anti-infectives: Anti-infectives (From admission, onward)    Start     Dose/Rate Route Frequency Ordered Stop   08/08/22 2200  piperacillin-tazobactam (ZOSYN) IVPB 3.375 g        3.375 g 12.5 mL/hr over 240 Minutes Intravenous Every 8 hours 08/08/22 1757 08/11/22 0152   08/08/22 1145  ceFAZolin (ANCEF) IVPB 2g/100 mL premix        2 g 200 mL/hr over 30 Minutes Intravenous To Surgery 08/08/22 1138 08/08/22 1400        Assessment/Plan POD#4 S/p Diagnostic laparoscopy converted to exploratory laparotomy; Reduction of strangulated internal hernia (retro-roux defect); Small bowel resection (hepatobiliary limb  from a few centimeters distal to the ligament of treitz to the small bowel anastomosis); Side to side, functionally end to end, stapled small bowel anastomosis (revision of old small bowel anastomosis); Omega loop hand sewn small bowel anastomosis (hepatobiliary limb to the alimentary limb) 8/18 Dr. Thermon Leyland - Pt s/p gastric sleeve WFBU 2010; s/p biliopancreatic diversion with duodenal switch procedure, at [facility] [place] 04/03/2020 - Dc NG and start Bariatric clear liquids. Continue TPN until reliably tolerating diet - Continue PCA today, if tolerating POs will dc tomorrow and increase PO medications. Schedule PO tylenol and IV robaxin - mobilize, continue  therapies - recommending HH PT - low grade temp, 100.1. WBC trending down. encouraged more pulm toilet, IS - continue JP, currently serosaguinous   ID - zosyn 8/18>>8/21. Ucx and Bcx pending FEN - IVF, Bariatric CLD, TPN VTE - lovenox Foley - d/c foley 8/21 and voiding   VDRF - extubated 8/19, CCM s/o 8/21 AKI - Cr 0.75, improved Depression, Anxiety - resume home meds     LOS: 4 days    Wellington Hampshire, [facility] Surgery 08/12/2022, 8:14 AM Please see Amion for pager number during day hours 7:00am-4:30pm

## 2022-08-13 LAB — BASIC METABOLIC PANEL
Anion gap: 8 (ref 5–15)
BUN: 8 mg/dL (ref 6–20)
CO2: 25 mmol/L (ref 22–32)
Calcium: 8.4 mg/dL — ABNORMAL LOW (ref 8.9–10.3)
Chloride: 107 mmol/L (ref 98–111)
Creatinine, Ser: 0.66 mg/dL (ref 0.44–1.00)
GFR, Estimated: >60 mL/min (ref >60)
Glucose, Bld: 97 mg/dL (ref 70–99)
Potassium: 3.7 mmol/L (ref 3.5–5.1)
Sodium: 140 mmol/L (ref 135–145)

## 2022-08-13 LAB — GLUCOSE, CAPILLARY
Glucose-Capillary: 106 mg/dL — ABNORMAL HIGH (ref 70–99)
Glucose-Capillary: 109 mg/dL — ABNORMAL HIGH (ref 70–99)
Glucose-Capillary: 115 mg/dL — ABNORMAL HIGH (ref 70–99)
Glucose-Capillary: 117 mg/dL — ABNORMAL HIGH (ref 70–99)
Glucose-Capillary: 120 mg/dL — ABNORMAL HIGH (ref 70–99)
Glucose-Capillary: 125 mg/dL — ABNORMAL HIGH (ref 70–99)

## 2022-08-13 LAB — PHOSPHORUS: Phosphorus: 3 mg/dL (ref 2.5–4.6)

## 2022-08-13 LAB — CBC
HCT: 29.5 % — ABNORMAL LOW (ref 36.0–46.0)
Hemoglobin: 10.4 g/dL — ABNORMAL LOW (ref 12.0–15.0)
MCH: 26.3 pg (ref 26.0–34.0)
MCHC: 35.3 g/dL (ref 30.0–36.0)
MCV: 74.7 fL — ABNORMAL LOW (ref 80.0–100.0)
Platelets: 227 10*3/uL (ref 150–400)
RBC: 3.95 MIL/uL (ref 3.87–5.11)
RDW: 13.7 % (ref 11.5–15.5)
WBC: 12.7 10*3/uL — ABNORMAL HIGH (ref 4.0–10.5)
nRBC: 0 % (ref 0.0–0.2)

## 2022-08-13 MED ORDER — PANTOPRAZOLE SODIUM 40 MG PO TBEC
40.0000 mg | DELAYED_RELEASE_TABLET | Freq: Every day | ORAL | Status: DC
  Administered 2022-08-13 – 2022-08-14 (×2): 40 mg via ORAL
  Filled 2022-08-13 (×2): qty 1

## 2022-08-13 MED ORDER — METHOCARBAMOL 500 MG PO TABS
500.0000 mg | ORAL_TABLET | Freq: Three times a day (TID) | ORAL | Status: DC
  Administered 2022-08-13 – 2022-08-14 (×4): 500 mg via ORAL
  Filled 2022-08-13 (×4): qty 1

## 2022-08-13 MED ORDER — TRAVASOL 10 % IV SOLN
INTRAVENOUS | Status: AC
  Filled 2022-08-13: qty 432

## 2022-08-13 MED ORDER — TAB-A-VITE/IRON PO TABS
1.0000 | ORAL_TABLET | Freq: Every day | ORAL | Status: DC
  Administered 2022-08-14: 1 via ORAL
  Filled 2022-08-13 (×2): qty 1

## 2022-08-13 MED ORDER — CALCIUM CARBONATE 1250 (500 CA) MG PO TABS
500.0000 mg | ORAL_TABLET | Freq: Three times a day (TID) | ORAL | Status: DC
  Administered 2022-08-13 – 2022-08-14 (×3): 1250 mg via ORAL
  Filled 2022-08-13 (×3): qty 1

## 2022-08-13 MED ORDER — FENTANYL CITRATE PF 50 MCG/ML IJ SOSY: 12.5000 ug | PREFILLED_SYRINGE | INTRAMUSCULAR | Status: DC | PRN

## 2022-08-13 MED ORDER — ENSURE ENLIVE PO LIQD
237.0000 mL | Freq: Two times a day (BID) | ORAL | Status: DC
  Administered 2022-08-13: 237 mL via ORAL

## 2022-08-13 MED ORDER — METOPROLOL TARTRATE 25 MG PO TABS
25.0000 mg | ORAL_TABLET | Freq: Two times a day (BID) | ORAL | Status: DC
  Administered 2022-08-13 – 2022-08-14 (×2): 25 mg via ORAL
  Filled 2022-08-13 (×2): qty 1

## 2022-08-13 MED ORDER — TAB-A-VITE/IRON PO TABS
1.0000 | ORAL_TABLET | Freq: Every day | ORAL | Status: DC
  Administered 2022-08-13: 1 via ORAL
  Filled 2022-08-13: qty 1

## 2022-08-13 MED ORDER — ENSURE MAX PROTEIN PO LIQD
11.0000 [oz_av] | Freq: Two times a day (BID) | ORAL | Status: DC
  Administered 2022-08-13 – 2022-08-14 (×2): 11 [oz_av] via ORAL
  Filled 2022-08-13 (×4): qty 330

## 2022-08-13 MED ORDER — OXYCODONE HCL 5 MG/5ML PO SOLN
5.0000 mg | ORAL | Status: DC | PRN
  Administered 2022-08-13: 10 mg via ORAL
  Administered 2022-08-13: 5 mg via ORAL
  Administered 2022-08-14: 10 mg via ORAL
  Filled 2022-08-13: qty 5
  Filled 2022-08-13 (×2): qty 10

## 2022-08-13 NOTE — TOC Progression Note (Addendum)
Transition of Care [facility]) - Progression Note    Patient Details  Name: Krista Fisher MRN: [id number] Date of Birth: Nov 20, 1976  Transition of Care [facility]) CM/SW [place], RN Phone [phone]  08/13/2022, 8:45 AM  Clinical Narrative:    [facility] acknowledges consult  for Home Health / DME needs. CM at bedside to discuss DME recommendations with patient. CM made patient aware that insurance does not cover the shower bench. Rolling walker has been ordered per [employer] and will be delivered to the bedside.         Expected Discharge Plan and Services                                                 Social Determinants of Health (SDOH) Interventions    Readmission Risk Interventions     No data to display

## 2022-08-13 NOTE — Progress Notes (Signed)
[facility] Progress Note  5 Days Post-Op  Subjective: CC-  Feeling well. Continues to have some intermittent abdominal pain, feels like gas pains. Pain improves when she passes gas. She has had 2 loose Bms since yesterday morning. Tolerating clears. Denies n/v.   Objective: Vital signs in last 24 hours: Temp:  [98.7 F (37.1 C)-99.8 F (37.7 C)] 99.8 F (37.7 C) (08/23 0309) Pulse Rate:  [81-85] 85 (08/22 1611) Resp:  [20-28] 20 (08/23 0400) BP: (118-124)/(62-68) 118/62 (08/23 0309) SpO2:  [93 %-96 %] 96 % (08/23 0400) FiO2 (%):  [21 %-26 %] 24 % (08/23 0400) Last BM Date : 08/13/22  Intake/Output from previous day: 08/22 0701 - 08/23 0700 In: 1216.3 [I.V.:1106.3; IV Piggyback:110] Out: 175 [Drains:175] Intake/Output this shift: No intake/output data recorded.  PE: Gen:  Alert, NAD, pleasant Card:  RRR, HR 80s Pulm:  CTAB, no wheezing, rate and effort normal Abd: Soft, ND, appropriately tender at incisions, midline incision with honeycomb and staples present and no erythema or drainage, some serosanguinous drainage from distal aspect where penrose is located. JP serosanguinous  Lab Results:  Recent Labs    08/11/22 0701 08/12/22 0228  WBC 17.9* 13.3*  HGB 11.8* 10.2*  HCT 32.0* 28.4*  PLT 172 197   BMET Recent Labs    08/11/22 0701 08/12/22 0228  NA 140 140  K 4.6 3.6  CL 111 110  CO2 23 25  GLUCOSE 104* 123*  BUN 11 9  CREATININE 0.89 0.75  CALCIUM 8.1* 7.9*   PT/INR No results for input(s): "LABPROT", "INR" in the last 72 hours. CMP     Component Value Date/Time   NA 140 08/12/2022 0228   NA 141 05/06/2018 1531   K 3.6 08/12/2022 0228   CL 110 08/12/2022 0228   CO2 25 08/12/2022 0228   GLUCOSE 123 (H) 08/12/2022 0228   BUN 9 08/12/2022 0228   BUN 11 05/06/2018 1531   CREATININE 0.75 08/12/2022 0228   CALCIUM 7.9 (L) 08/12/2022 0228   PROT 7.6 08/08/2022 0359   PROT 6.9 05/06/2018 1531   ALBUMIN 4.0 08/08/2022 0359   ALBUMIN  3.8 05/06/2018 1531   AST 50 (H) 08/08/2022 0359   ALT 33 08/08/2022 0359   ALKPHOS 149 (H) 08/08/2022 0359   BILITOT 0.6 08/08/2022 0359   BILITOT 0.2 05/06/2018 1531   GFRNONAA >60 08/12/2022 0228   GFRAA >60 09/06/2020 1242   Lipase     Component Value Date/Time   LIPASE 33 08/08/2022 0359       Studies/Results: [place] EKG SITE RITE  Result Date: 08/11/2022 If Site Rite image not attached, placement could not be confirmed due to current cardiac rhythm.   Anti-infectives: Anti-infectives (From admission, onward)    Start     Dose/Rate Route Frequency Ordered Stop   08/08/22 2200  piperacillin-tazobactam (ZOSYN) IVPB 3.375 g        3.375 g 12.5 mL/hr over 240 Minutes Intravenous Every 8 hours 08/08/22 1757 08/11/22 0152   08/08/22 1145  ceFAZolin (ANCEF) IVPB 2g/100 mL premix        2 g 200 mL/hr over 30 Minutes Intravenous To Surgery 08/08/22 1138 08/08/22 1400        Assessment/Plan POD#5 S/p Diagnostic laparoscopy converted to exploratory laparotomy; Reduction of strangulated internal hernia (retro-roux defect); Small bowel resection (hepatobiliary limb from a few centimeters distal to the ligament of treitz to the small bowel anastomosis); Side to side, functionally end to end, stapled small bowel anastomosis (revision  of old small bowel anastomosis); Omega loop hand sewn small bowel anastomosis (hepatobiliary limb to the alimentary limb) 8/18 Dr. Thermon Leyland - Pt s/p gastric sleeve WFBU 2010; s/p biliopancreatic diversion with duodenal switch procedure, at [facility] [place] 04/03/2020 - Advance to Bariatric full liquids, add protein shakes. Will ask bariatric dietician to see for assistance with supplements. Wean TPN to 1/2 rate tonight if tolerating fulls - D/c PCA and add oral pain medications - mobilize, continue therapies - no follow up recommended at discharge, DME ordered - continue JP, currently serosaguinous - Labs pending this AM   ID - zosyn 8/18>>8/21. Bcx  NGTD FEN - IVF, Bariatric FLD, Ensure, 1/2 TPN tonight VTE - lovenox Foley - d/c foley 8/21 and voiding   VDRF - extubated 8/19, CCM s/o 8/21 AKI - BMP pending Depression, Anxiety - resume home meds     LOS: 5 days    Wellington Hampshire, [facility] 08/13/2022, 8:43 AM Please see Amion for pager number during day hours 7:00am-4:30pm

## 2022-08-13 NOTE — Anesthesia Postprocedure Evaluation (Signed)
Anesthesia Post Note  Patient: Krista Fisher  Procedure(s) Performed: LAPAROSCOPY DIAGNOSTIC (Abdomen) EXPLORATORY LAPAROTOMY (Abdomen)     Patient location during evaluation: SICU Anesthesia Type: General Level of consciousness: sedated Pain management: pain level controlled Vital Signs Assessment: post-procedure vital signs reviewed and stable Respiratory status: patient remains intubated per anesthesia plan Cardiovascular status: stable Postop Assessment: no apparent nausea or vomiting Anesthetic complications: no   No notable events documented.            Lemon Sternberg

## 2022-08-13 NOTE — Progress Notes (Signed)
Mobility Specialist Progress Note   08/13/22 1215  Mobility  Activity Ambulated with assistance in hallway  Level of Assistance Standby assist, set-up cues, supervision of patient - no hands on  Assistive Device Front wheel walker  Distance Ambulated (ft) 220 ft  Activity Response Tolerated well  $Mobility charge 1 Mobility   Pre Mobility: 84 HR, 113/59 BP, 98% SpO2 During Mobility: 112 HR, 94% SpO2 Post Mobility: 102 HR, 114/63 BP, 95% SpO2  Received pt in bed stating discomfort at incision site but agreeable to mobility. No complaints or faults throughout. Pt also demonstrating ambulation in room w/o AD, gait slow but steady. Returned to chair w/ all needs met.    Holland Falling Mobility Specialist MS [facility] #:  [phone] Acute Rehab Office:  [phone]'

## 2022-08-13 NOTE — Progress Notes (Addendum)
PHARMACY - TOTAL PARENTERAL NUTRITION CONSULT NOTE   Indication: Small bowel obstruction s/p small bowel resection   Patient Measurements: Height: '5\' 6"'$  (167.6 cm) Weight: 127.8 kg (281 lb 12 oz) IBW/kg (Calculated) : 59.3 TPN AdjBW (KG): 76.3 Body mass index is 45.48 kg/m. Usual Weight: 127.5kg   Assessment: Krista Fisher is a 46 year old female with a PMH of sleeve gastrectomy (2012), anxiety, and depression. Current admission is for a small bowel obstruction that patient underwent an ex-lap for (see below for details). Patient has been NPO since 8/18 however surgery expects short gut and nutrition issues long-term secondary to the patient's surgical history. Patient has started taking PO feeds and medications. Spoke with patient today and she said that PO feeding is going well this morning.   Glucose / Insulin: <180, required 2 unit SSI, A1C: 5.0 Electrolytes: Mag: 1.9 (on 8/21), calcium: 8.4 (corrected), K: 3.7, Na: 140, phos: 3.0  Renal: Normal, Scr: 0.66, CrCl: 120.48m/min  Hepatic: Normal, AST/ALT: 50/33  Intake / Output; MIVF: no MVIF, no noted emesis or NG output  GI Imaging: 8/18: CT abdomen, Distal small bowel obstruction GI Surgeries / Procedures:  08/08/2022: ex-lap, reduction of strangulated internal hernia, small bowel resection 8/13:2021: biliopancreatic diversion with duodenal switch  2012: sleeve gastrectomy   Central access: Double lumen PICC 8/21  TPN start date: 8/21    Nutritional Goals: Goal TPN rate is 95 mL/hr (provides 93 g of protein and 1925 kcals per day)  RD Assessment:  Estimated Needs Total Energy Estimated Needs: 2000-2200 Total Protein Estimated Needs: 105-115 grams Total Fluid Estimated Needs: >2 L/day  Current Nutrition:  Clear bariatric liquid + TPN   Plan:  Decrease TPN to half of goal rate 473mhr at 1800, will provide 43 g of protein and 915kcal. Plan to discontinue tomorrow as patient likely to increase oral intake.  Electrolytes  in TPN: Na 3532mL, K 40m64m, Ca 5mEq35m Mg 8mEq/38mand Phos 19mmol37mCl:Ac 1:1 Took out MVI from TPN as patient has taken oral this morning  Discontinue Sensitive q4h SSI as patient has not required and weaning down rate.  Monitor TPN labs on Mon/Thurs.   HeatherVentura Sellers023,11:04 AM

## 2022-08-14 ENCOUNTER — Other Ambulatory Visit (HOSPITAL_COMMUNITY): Payer: Self-pay

## 2022-08-14 LAB — MAGNESIUM: Magnesium: 1.8 mg/dL (ref 1.7–2.4)

## 2022-08-14 LAB — COMPREHENSIVE METABOLIC PANEL
ALT: 24 U/L (ref 0–44)
AST: 33 U/L (ref 15–41)
Albumin: 2.1 g/dL — ABNORMAL LOW (ref 3.5–5.0)
Alkaline Phosphatase: 101 U/L (ref 38–126)
Anion gap: 6 (ref 5–15)
BUN: 8 mg/dL (ref 6–20)
CO2: 26 mmol/L (ref 22–32)
Calcium: 8.3 mg/dL — ABNORMAL LOW (ref 8.9–10.3)
Chloride: 106 mmol/L (ref 98–111)
Creatinine, Ser: 0.76 mg/dL (ref 0.44–1.00)
GFR, Estimated: >60 mL/min (ref >60)
Glucose, Bld: 107 mg/dL — ABNORMAL HIGH (ref 70–99)
Potassium: 4.2 mmol/L (ref 3.5–5.1)
Sodium: 138 mmol/L (ref 135–145)
Total Bilirubin: 0.4 mg/dL (ref 0.3–1.2)
Total Protein: 5.7 g/dL — ABNORMAL LOW (ref 6.5–8.1)

## 2022-08-14 LAB — GLUCOSE, CAPILLARY
Glucose-Capillary: 107 mg/dL — ABNORMAL HIGH (ref 70–99)
Glucose-Capillary: 95 mg/dL (ref 70–99)

## 2022-08-14 LAB — PHOSPHORUS: Phosphorus: 3.6 mg/dL (ref 2.5–4.6)

## 2022-08-14 LAB — TRIGLYCERIDES: Triglycerides: 70 mg/dL (ref <150)

## 2022-08-14 MED ORDER — METHOCARBAMOL 500 MG PO TABS
500.0000 mg | ORAL_TABLET | Freq: Three times a day (TID) | ORAL | 0 refills | Status: DC | PRN
  Filled 2022-08-14: qty 30, 10d supply, fill #0

## 2022-08-14 MED ORDER — METHOCARBAMOL 500 MG PO TABS: 500.0000 mg | ORAL_TABLET | Freq: Three times a day (TID) | ORAL | Status: DC | PRN

## 2022-08-14 MED ORDER — ENSURE MAX PROTEIN PO LIQD: 11.0000 [oz_av] | Freq: Two times a day (BID) | ORAL | Status: DC

## 2022-08-14 MED ORDER — OXYCODONE HCL 5 MG PO TABS
5.0000 mg | ORAL_TABLET | Freq: Four times a day (QID) | ORAL | 0 refills | Status: DC | PRN
  Filled 2022-08-14: qty 20, 5d supply, fill #0

## 2022-08-14 MED ORDER — ACETAMINOPHEN 325 MG PO TABS: 650.0000 mg | ORAL_TABLET | Freq: Four times a day (QID) | ORAL | Status: DC | PRN

## 2022-08-14 MED ORDER — OXYCODONE HCL 5 MG PO TABS
5.0000 mg | ORAL_TABLET | ORAL | Status: DC | PRN
  Administered 2022-08-14: 10 mg via ORAL
  Filled 2022-08-14: qty 2

## 2022-08-14 MED ORDER — METOPROLOL TARTRATE 25 MG PO TABS
25.0000 mg | ORAL_TABLET | Freq: Two times a day (BID) | ORAL | 0 refills | Status: DC
  Filled 2022-08-14: qty 60, 30d supply, fill #0

## 2022-08-14 MED ORDER — ONDANSETRON 4 MG PO TBDP
4.0000 mg | ORAL_TABLET | Freq: Four times a day (QID) | ORAL | 0 refills | Status: DC | PRN
  Filled 2022-08-14: qty 20, 5d supply, fill #0

## 2022-08-14 NOTE — Discharge Instructions (Addendum)
Recommend continuing more of a liquid diet for about 1 week after discharge. Ok to incorporate some soft/solid foods earlier if your stools are very loose. Call if you start having more loose stools daily.   [facility], [place] [phone]  OPEN ABDOMINAL SURGERY: POST OP INSTRUCTIONS  Always review your discharge instruction sheet given to you by the facility where your surgery was performed.  IF YOU HAVE DISABILITY OR FAMILY LEAVE FORMS, YOU MUST BRING THEM TO THE OFFICE FOR PROCESSING.  PLEASE DO NOT GIVE THEM TO YOUR DOCTOR.  A prescription for pain medication may be given to you upon discharge.  Take your pain medication as prescribed, if needed.  If narcotic pain medicine is not needed, then you may take acetaminophen (Tylenol) or ibuprofen (Advil) as needed. Take your usually prescribed medications unless otherwise directed. If you need a refill on your pain medication, please contact your pharmacy. They will contact our office to request authorization.  Prescriptions will not be filled after 5pm or on week-ends. You should follow a light diet the first few days after arrival home, such as soup and crackers, pudding, etc.unless your doctor has advised otherwise. A high-fiber, low fat diet can be resumed as tolerated.   Be sure to include lots of fluids daily. Most patients will experience some swelling and bruising on the chest and neck area.  Ice packs will help.  Swelling and bruising can take several days to resolve Most patients will experience some swelling and bruising in the area of the incision. Ice pack will help. Swelling and bruising can take several days to resolve..  It is common to experience some constipation if taking pain medication after surgery.  Increasing fluid intake and taking a stool softener will usually help or prevent this problem from occurring.  A mild laxative (Milk of Magnesia or Miralax) should be taken according to package directions if  there are no bowel movements after 48 hours.  You may have steri-strips (small skin tapes) in place directly over the incision.  These strips should be left on the skin for 7-10 days.  If your surgeon used skin glue on the incision, you may shower in 24 hours.  The glue will flake off over the next 2-3 weeks.  Any sutures or staples will be removed at the office during your follow-up visit. You may find that a light gauze bandage over your incision may keep your staples from being rubbed or pulled. You may shower and replace the bandage daily. ACTIVITIES:  You may resume regular (light) daily activities beginning the next day--such as daily self-care, walking, climbing stairs--gradually increasing activities as tolerated.  You may have sexual intercourse when it is comfortable.  Refrain from any heavy lifting or straining until approved by your doctor. You may drive when you no longer are taking prescription pain medication, you can comfortably wear a seatbelt, and you can safely maneuver your car and apply brakes Return to Work: ___________________________________ Krista Fisher Bast should see your doctor in the office for a follow-up appointment approximately two weeks after your surgery.  Make sure that you call for this appointment within a day or two after you arrive home to insure a convenient appointment time. OTHER INSTRUCTIONS:  _____________________________________________________________ _____________________________________________________________  WHEN TO CALL YOUR DOCTOR: Fever over 101.0 Inability to urinate Nausea and/or vomiting Extreme swelling or bruising Continued bleeding from incision. Increased pain, redness, or drainage from the incision. Difficulty swallowing or breathing Muscle cramping or spasms.  Numbness or tingling in hands or feet or around lips.  The clinic staff is available to answer your questions during regular business hours.  Please don't hesitate to call and ask to speak  to one of the nurses if you have concerns.  For further questions, please visit [email or web address]

## 2022-08-14 NOTE — Discharge Summary (Signed)
[facility] Discharge Summary   Patient ID: Krista Fisher MRN: [id number] DOB/AGE: 03/12/1976 46 y.o.  Admit date: 08/08/2022 Discharge date: 08/14/2022  Admitting Diagnosis: Abdominal pain, possible SBO in the setting of prior bariatric surgery  Discharge Diagnosis Internal hernia with strangulation of the hepatobiliary limb of the duodenal switch anatomy  Consultants CCM  Imaging: No results found.  Procedures Dr. Thermon Leyland (08/08/2022) -  Diagnostic laparoscopy converted to exploratory laparotomy Reduction of strangulated internal hernia (retro-roux defect) Small bowel resection (hepatobiliary limb from a few centimeters distal to the ligament of treitz to the small bowel anastomosis) Side to side, functionally end to end, stapled small bowel anastomosis (revision of old small bowel anastomosis) Omega loop hand sewn small bowel anastomosis (hepatobiliary limb to the alimentary limb)  Hospital Course:  Krista Fisher is a 46 y.o. female with a history of anxiety and depression who underwent a gastric sleeve at [facility] in 2010.  She initially lost weight but then regained her weight.  She saw [facility] in 2021 and underwent a biliopancreatic diversion with duodenal switch.  She has since lost between 80 and 90 pounds. She presented to [facility] 8/18 with acute worsening abdominal pain. She underwent a CT scan that reveals a distal small bowel obstruction of indeterminate etiology; no mesenteric arterial occlusive disease was noted. Lactic acid noted to be elevated. Given her h/o bariatric surgery and severe pain on abdominal exam she was taken to the OR for diagnostic laparoscopy. Intraoperatively she was found to have and internal hernia with strangulation of the hepatobiliary limb of the duodenal switch anatomy. Procedure was converted to laparotomy and she underwent the procedures listed above. PCCM consulted postoperatively for ICU evaluation. She was successfully extubated  8/19. AKI resolved with rehydration. She did have a mild ileus postoperatively. She was started on TPN for nutrition, but once bowel function returned this was weaned and diet was advanced as tolerated to bariatric full liquids. JP output nonbilious after starting a diet so this was removed prior to discharge.  On 8/24 the patient was felt stable for discharge home.  Patient will follow up as below and knows to call with questions or concerns.    I have personally reviewed the patients medication history on the [place] controlled substance database.     Allergies as of 08/14/2022       Reactions   Lamotrigine Other (See Comments)   Patient had headaches, abdominal cramping   Wellbutrin Xl [bupropion] Other (See Comments)   Abdominal pain   Latex Rash, Other (See Comments)   Burning        Medication List     TAKE these medications    A-25 PO Take 1 tablet by mouth daily.   acetaminophen 325 MG tablet Commonly known as: TYLENOL Take 2 tablets (650 mg total) by mouth every 6 (six) hours as needed for mild pain.   ALPRAZolam 0.5 MG tablet Commonly known as: XANAX Take 1 tablet by mouth daily as needed for severe anxiety/panic attack.   Calcium Carb-Cholecalciferol 600-10 MG-MCG Tabs Take 1 tablet by mouth daily.   cetirizine 10 MG tablet Commonly known as: ZYRTEC Take 10 mg by mouth daily.   COLLAGEN PO Take 1 tablet by mouth daily.   Ensure Max Protein Liqd Take 330 mLs (11 oz total) by mouth 2 (two) times daily.   escitalopram 20 MG tablet Commonly known as: Lexapro Take 1 tablet (20 mg total) by mouth daily.   ferrous sulfate 325 (65 FE) MG tablet Take  325 mg by mouth daily with breakfast.   fluticasone 50 MCG/ACT nasal spray Commonly known as: FLONASE Place 1 spray into both nostrils daily.   GINGER PO Take 1 tablet by mouth daily.   hydrOXYzine 50 MG tablet Commonly known as: ATARAX take 1 tablet (50 mg) by mouth up to 3 times per day as needed    lamoTRIgine 25 MG tablet Commonly known as: LaMICtal Take 1 tablet by mouth once daily for 1 week then take 1 tablet twice daily as directed.   magnesium 30 MG tablet Take 30 mg by mouth 2 (two) times daily.   methocarbamol 500 MG tablet Commonly known as: ROBAXIN Take 1 tablet (500 mg total) by mouth every 8 (eight) hours as needed for muscle spasms.   metoprolol tartrate 25 MG tablet Commonly known as: LOPRESSOR Take 1 tablet (25 mg total) by mouth 2 (two) times daily.   multivitamins with iron Tabs tablet Take 1 tablet by mouth daily.   Nexplanon 68 MG Impl implant Generic drug: etonogestrel 1 each by Subdermal route once.   omeprazole 40 MG capsule Commonly known as: PRILOSEC Take 1 capsule (40 mg total) by mouth daily before a meal.   ondansetron 4 MG disintegrating tablet Commonly known as: ZOFRAN-ODT Take 1 tablet (4 mg total) by mouth every 6 (six) hours as needed for nausea.   oxyCODONE 5 MG immediate release tablet Commonly known as: Oxy IR/ROXICODONE Take 1 tablet (5 mg total) by mouth every 6 (six) hours as needed for severe pain.   QC TUMERIC COMPLEX PO Take 1 tablet by mouth daily.   zinc gluconate 50 MG tablet Take 50 mg by mouth daily.               Durable Medical Equipment  (From admission, onward)           Start     Ordered   08/13/22 0800  For home use only DME Tub bench  Once        08/13/22 0759   08/13/22 0800  For home use only DME Walker rolling  Once       Question Answer Comment  Walker: With Rochelle Wheels   Patient needs a walker to treat with the following condition S/P exploratory laparotomy      08/13/22 0759              Follow-up [facility]. Go on 08/21/2022.   Specialty: General Surgery Why: Your appointment is 8/31 at 10:30am with a nurse for staple removal, Arrive 55mn early to check in, fill out paperwork, BEngineer, civil (consulting)ID and insurance information Contact  information: [phone]       [place], PNickola Major MD. Go on 08/28/2022.   Specialty: Surgery Why: Your appointment is 9/7 at 9:10am Please arrive 15 minutes prior to your appointment for check in Contact information: [address] [place] [address] [place] [phone]         MMarrian Salvage FNP. Call.   Specialty: Internal Medicine Why: Call for post-hospitalization follow up appointment. Discuss blood pressure Contact information: [phone]                 Signed: BWellington Hampshire PS[facility] 08/14/2022, 2:41 PM Please see Amion for pager number during day hours 7:00am-4:30pm

## 2022-08-14 NOTE — Progress Notes (Signed)
Mobility Specialist Progress Note:   08/14/22 1456  Mobility  Activity Ambulated with assistance in hallway  Level of Assistance Standby assist, set-up cues, supervision of patient - no hands on  Assistive Device Front wheel walker  Distance Ambulated (ft) 220 ft  Activity Response Tolerated well  $Mobility charge 1 Mobility   Pt received in bed and agreeable. No complaints. Pt left in chair with all needs met and call bell in reach.   Shanin Szymanowski Mobility Specialist-Acute Rehab Secure Chat only

## 2022-08-14 NOTE — Consult Note (Signed)
   [facility] CM Inpatient Consult   08/14/2022  Krista Fisher 1976/07/07 [id number]   [facility] [ACO] Patient: Krista Fisher  Primary Care Provider:  [name], FNP with [facility] Brief review of admission and Inpatient [facility] teams notes for potential needs.     Fisher: Continue to follow for post hospital needs for care coordination. A referral request for patient will be followed by [place] Coordinator for transition of care needs can be arranged closer to care needs and disposition   For additional questions or referrals please contact:   Natividad Brood, RN BSN [facility] Liaison  [phone] business mobile phone Toll free office [phone]  Fax number: [phone] [email or web address]

## 2022-08-14 NOTE — Progress Notes (Signed)
[facility] Progress Note  6 Days Post-Op  Subjective: CC-  Feels a little better today. Continues to have some intermittent crampy abdominal pain relieved when she passes gas. Denies n/v. Tolerating full liquids. She drank 1/2 of 2 Ensures. She had 2 loose Bms yesterday.  Objective: Vital signs in last 24 hours: Temp:  [98.7 F (37.1 C)-99.7 F (37.6 C)] 99.3 F (37.4 C) (08/24 0803) Pulse Rate:  [90-99] 90 (08/24 0418) Resp:  [18-24] 18 (08/24 0803) BP: (107-132)/(57-66) 111/65 (08/24 0803) SpO2:  [92 %-98 %] 92 % (08/24 0418) Last BM Date : 08/13/22  Intake/Output from previous day: 08/23 0701 - 08/24 0700 In: 1244.3 [I.V.:1244.3] Out: 200 [Drains:200] Intake/Output this shift: Total I/O In: -  Out: 56 [Drains:50]  PE: Gen:  Alert, NAD, pleasant Card:  RRR, HR 80s Pulm:  CTAB, no wheezing, rate and effort normal on room air Abd: Soft, ND, appropriately tender at incision, midline incision with staples present and no erythema or drainage, some serosanguinous drainage from distal aspect where penrose is located. JP serous  Lab Results:  Recent Labs    08/12/22 0228 08/13/22 0923  WBC 13.3* 12.7*  HGB 10.2* 10.4*  HCT 28.4* 29.5*  PLT 197 227   BMET Recent Labs    08/13/22 0923 08/14/22 0540  NA 140 138  K 3.7 4.2  CL 107 106  CO2 25 26  GLUCOSE 97 107*  BUN 8 8  CREATININE 0.66 0.76  CALCIUM 8.4* 8.3*   PT/INR No results for input(s): "LABPROT", "INR" in the last 72 hours. CMP     Component Value Date/Time   NA 138 08/14/2022 0540   NA 141 05/06/2018 1531   K 4.2 08/14/2022 0540   CL 106 08/14/2022 0540   CO2 26 08/14/2022 0540   GLUCOSE 107 (H) 08/14/2022 0540   BUN 8 08/14/2022 0540   BUN 11 05/06/2018 1531   CREATININE 0.76 08/14/2022 0540   CALCIUM 8.3 (L) 08/14/2022 0540   PROT 5.7 (L) 08/14/2022 0540   PROT 6.9 05/06/2018 1531   ALBUMIN 2.1 (L) 08/14/2022 0540   ALBUMIN 3.8 05/06/2018 1531   AST 33 08/14/2022 0540    ALT 24 08/14/2022 0540   ALKPHOS 101 08/14/2022 0540   BILITOT 0.4 08/14/2022 0540   BILITOT 0.2 05/06/2018 1531   GFRNONAA >60 08/14/2022 0540   GFRAA >60 09/06/2020 1242   Lipase     Component Value Date/Time   LIPASE 33 08/08/2022 0359       Studies/Results: No results found.  Anti-infectives: Anti-infectives (From admission, onward)    Start     Dose/Rate Route Frequency Ordered Stop   08/08/22 2200  piperacillin-tazobactam (ZOSYN) IVPB 3.375 g        3.375 g 12.5 mL/hr over 240 Minutes Intravenous Every 8 hours 08/08/22 1757 08/11/22 0152   08/08/22 1145  ceFAZolin (ANCEF) IVPB 2g/100 mL premix        2 g 200 mL/hr over 30 Minutes Intravenous To Surgery 08/08/22 1138 08/08/22 1400        Assessment/Plan POD#6 S/p Diagnostic laparoscopy converted to exploratory laparotomy; Reduction of strangulated internal hernia (retro-roux defect); Small bowel resection (hepatobiliary limb from a few centimeters distal to the ligament of treitz to the small bowel anastomosis); Side to side, functionally end to end, stapled small bowel anastomosis (revision of old small bowel anastomosis); Omega loop hand sewn small bowel anastomosis (hepatobiliary limb to the alimentary limb) 8/18 Dr. Thermon Leyland - Pt s/p gastric sleeve [facility]  2010; s/p biliopancreatic diversion with duodenal switch procedure, at [facility] [place] 04/03/2020 - Continue Bariatric full liquids x1 week at discharge, protein shakes. If loose stool increases can add some soft food. Plan to refer to bariatric dietician clinic at discharge. D/c TPN after bag is complete - mobilize, continue therapies - no follow up recommended at discharge, DME ordered - continue JP, currently serous. Plan to dc at discharge - possible PM discharge   ID - zosyn 8/18>>8/21. Bcx NGTD FEN - IVF, Bariatric FLD, Ensure, d/c TPN VTE - lovenox Foley - d/c foley 8/21 and voiding   VDRF - extubated 8/19, CCM s/o 8/21 AKI - resolved Depression,  Anxiety - resume home meds     LOS: 6 days    Wellington [place], [facility] 08/14/2022, 9:53 AM Please see Amion for pager number during day hours 7:00am-4:30pm

## 2022-08-14 NOTE — Progress Notes (Signed)
Physical Therapy Treatment Patient Details Name: Krista Fisher MRN: [id number] DOB: 1976-09-02 Today's Date: 08/14/2022   History of Present Illness The pt is a 46 yo female presenting with abdominal pain, found to have strangulated internal hernia and adhesive bowel obstruction. S/p ex lap with reduction of internal hernia and small bowel resection on 8/18. PMH includes: anxiety, depression, and prior gastric sleeve in 2010 and a biliopancreatic diversion with duodenal switch in 2021.    PT Comments    Pt received in chair, agreeable to therapy session with emphasis on HEP instruction (handout given, see link below), activity pacing/gradual progression of mobility throughout the day within tolerance, signs/sx of infection post-op, abdominal protection precs, and RW use/device adjustment. Pt receptive to instruction and denies questions/concerns, now at modI level for transfers/gait using RW. Plan to review bed mobility with abdominal protection next session and likely to be able to DC acute PT in 1-2 more sessions pending progress acutely. Pt continues to benefit from PT services to progress toward functional mobility goals.   Recommendations for follow up therapy are one component of a multi-disciplinary discharge planning process, led by the attending physician.  Recommendations may be updated based on patient status, additional functional criteria and insurance authorization.  Follow Up Recommendations  No PT follow up     Assistance Recommended at Discharge Intermittent Supervision/Assistance  Patient can return home with the following A little help with walking and/or transfers;A little help with bathing/dressing/bathroom;Assistance with cooking/housework;Direct supervision/assist for medications management;Direct supervision/assist for financial management;Assist for transportation;Help with stairs or ramp for entrance   Equipment Recommendations  Rolling walker (2 wheels);Other  (comment) (tub bench)    Recommendations for Other Services       Precautions / Restrictions Precautions Precautions: Fall Precaution Comments: abdominal protection precs Restrictions Weight Bearing Restrictions: No     Mobility  Bed Mobility Overal bed mobility: Needs Assistance             General bed mobility comments: pt received in chair    Transfers Overall transfer level: Needs assistance Equipment used: Rolling walker (2 wheels) Transfers: Sit to/from Stand Sit to Stand: Modified independent (Device/Increase time)           General transfer comment: from chair>RW and RW>low toilet height seat    Ambulation/Gait Ambulation/Gait assistance: Modified independent (Device/Increase time) Gait Distance (Feet): 150 Feet Assistive device: Rolling walker (2 wheels) Gait Pattern/deviations: Decreased stride length, Trunk flexed, Step-through pattern Gait velocity: decreased     General Gait Details: Slow, guarded gait with decreased step lengths bilaterally. VSS on RA.   Stairs             Wheelchair Mobility    Modified Rankin (Stroke Patients Only)       Balance Overall balance assessment: Needs assistance Sitting-balance support: Feet supported, No upper extremity supported Sitting balance-Leahy Scale: Good     Standing balance support: During functional activity Standing balance-Leahy Scale: Fair Standing balance comment: steadier for dynamic tasks with RW, limited assessment unsupported but no LOB for static tasks without AD                            Cognition Arousal/Alertness: Awake/alert Behavior During Therapy: WFL for tasks assessed/performed Overall Cognitive Status: Within Functional Limits for tasks assessed  General Comments: Pt receptive to instruction, toward end of session when discussing PLOF 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        Exercises  Other Exercises Other Exercises: verbal/visual demo for supine/standing HEP (link: [email or web address]  Access Code: [email or web address]) Other Exercises: demo for posterior pelvic tilt in supine/sidelying, SLR in supine (with PPT if pain allows) and dead bug UE exercises (with PPT) also standing heel raises, mini squats, hamstring curls with and progressing to without UE support. Other Exercises: gait as TE for BLE strengthening and endurance building    General Comments General comments (skin integrity, edema, etc.): HR to 115 bpm with exertion, SpO2 99% on RA, no acute s/sx distress; pt reports mild nausea but BP stable per chart review; reviewed s/sx infection with pt with teachback      Pertinent Vitals/Pain Pain Assessment Pain Assessment: Faces Faces Pain Scale: Hurts a little bit Pain Location: abdomen Pain Descriptors / Indicators: Discomfort, Guarding Pain Intervention(s): Monitored during session, Repositioned (per RN plan to bring pain meds after pt showers)     PT Goals (current goals can now be found in the care plan section) Acute Rehab PT Goals Patient Stated Goal: return home, to independence PT Goal Formulation: With patient Time For Goal Achievement: 08/24/22 Progress towards PT goals: Progressing toward goals    Frequency    Min 3X/week      PT Plan Current plan remains appropriate       AM-PAC PT "6 Clicks" Mobility   Outcome Measure  Help needed turning from your back to your side while in a flat bed without using bedrails?: A Little Help needed moving from lying on your back to sitting on the side of a flat bed without using bedrails?: A Little Help needed moving to and from a bed to a chair (including a wheelchair)?: None Help needed standing up from a chair using your arms (e.g., wheelchair or bedside chair)?: None Help needed to walk in hospital room?: None (with RW) Help needed climbing 3-5 steps with a railing? : A Little 6 Click Score: 21     End of Session   Activity Tolerance: Patient tolerated treatment well Patient left: in chair;with call bell/phone within reach;Other (comment) (pt up in bathroom (toileting) then plan to shower, towels, washcloths and clothing for home on counter, pt to use wall pull bell if needing assist for bathing/dressing) Nurse Communication: Mobility status;Other (comment) (notified pt up in bathroom) PT Visit Diagnosis: Other abnormalities of gait and mobility (R26.89);Pain Pain - part of body:  (abdomen/surgical site)     Time: 8889-1694 PT Time Calculation (min) (ACUTE ONLY): 19 min  Charges:  $Therapeutic Exercise: 8-22 mins                     Fraser Busche P., PTA Acute Rehabilitation Services Secure Chat Preferred 9a-5:30pm Office: [place] 08/14/2022, 4:17 PM

## 2022-08-15 ENCOUNTER — Other Ambulatory Visit (HOSPITAL_COMMUNITY): Payer: Self-pay

## 2022-08-15 LAB — CULTURE, BLOOD (ROUTINE X 2)
Culture: NO GROWTH
Culture: NO GROWTH
Special Requests: ADEQUATE
Special Requests: ADEQUATE

## 2022-08-18 ENCOUNTER — Telehealth: Payer: Self-pay

## 2022-08-18 NOTE — Telephone Encounter (Signed)
Transition Care Management Follow-up Telephone Call Date of discharge and from where: [facility] 08-14-22 Dx: Abdominal pain  How have you been since you were released from the hospital? Feeling better  Any questions or concerns? No  Items Reviewed: Did the pt receive and understand the discharge instructions provided? yes Medications obtained and verified? Yes  Other? No  Any new allergies since your discharge? No  Dietary orders reviewed? Yes Do you have support at home? Yes   Home Care and Equipment/Supplies: Were home health services ordered? not applicable If so, what is the name of the agency? na  Has the agency set up a time to come to the patient's home? not applicable Were any new equipment or medical supplies ordered?  No What is the name of the medical supply agency? na Were you able to get the supplies/equipment? not applicable Do you have any questions related to the use of the equipment or supplies? No  Functional Questionnaire: (I = Independent and D = Dependent) ADLs: I  Bathing/Dressing- I  Meal Prep- I  Eating- I  Maintaining continence- I  Transferring/Ambulation- I  Managing Meds- I  Follow up appointments reviewed:  PCP Hospital f/u appt confirmed? Yes  Scheduled to see Dr Volanda Napoleon on 08-20-22 @ 130pm. [facility] f/u appt confirmed? Yes  Scheduled to see Harland Dingwall on 08-19-22 @ 820am. Are transportation arrangements needed? No  If their condition worsens, is the pt aware to call PCP or go to the Emergency Dept.? Yes Was the patient provided with contact information for the PCP's office or ED? Yes Was to pt encouraged to call back with questions or concerns? Yes

## 2022-08-19 ENCOUNTER — Inpatient Hospital Stay: Payer: 59 | Admitting: Family

## 2022-08-19 ENCOUNTER — Encounter: Payer: Self-pay | Admitting: Family Medicine

## 2022-08-19 ENCOUNTER — Ambulatory Visit (INDEPENDENT_AMBULATORY_CARE_PROVIDER_SITE_OTHER): Payer: 59 | Admitting: Family Medicine

## 2022-08-19 VITALS — BP 128/72 | HR 90 | Temp 97.6°F | Ht 66.0 in | Wt 263.0 lb

## 2022-08-19 DIAGNOSIS — Z124 Encounter for screening for malignant neoplasm of cervix: Secondary | ICD-10-CM | POA: Diagnosis not present

## 2022-08-19 DIAGNOSIS — I10 Essential (primary) hypertension: Secondary | ICD-10-CM | POA: Diagnosis not present

## 2022-08-19 DIAGNOSIS — M545 Low back pain, unspecified: Secondary | ICD-10-CM | POA: Diagnosis not present

## 2022-08-19 DIAGNOSIS — F419 Anxiety disorder, unspecified: Secondary | ICD-10-CM

## 2022-08-19 DIAGNOSIS — Z09 Encounter for follow-up examination after completed treatment for conditions other than malignant neoplasm: Secondary | ICD-10-CM

## 2022-08-19 DIAGNOSIS — Z130 Encounter for screening for diseases of the blood and blood-forming organs and certain disorders involving the immune mechanism: Secondary | ICD-10-CM | POA: Diagnosis not present

## 2022-08-19 DIAGNOSIS — Z9049 Acquired absence of other specified parts of digestive tract: Secondary | ICD-10-CM

## 2022-08-19 DIAGNOSIS — Z01419 Encounter for gynecological examination (general) (routine) without abnormal findings: Secondary | ICD-10-CM | POA: Diagnosis not present

## 2022-08-19 DIAGNOSIS — Z9889 Other specified postprocedural states: Secondary | ICD-10-CM | POA: Diagnosis not present

## 2022-08-19 DIAGNOSIS — Z8719 Personal history of other diseases of the digestive system: Secondary | ICD-10-CM

## 2022-08-19 DIAGNOSIS — Z1272 Encounter for screening for malignant neoplasm of vagina: Secondary | ICD-10-CM | POA: Diagnosis not present

## 2022-08-19 DIAGNOSIS — Z1151 Encounter for screening for human papillomavirus (HPV): Secondary | ICD-10-CM | POA: Diagnosis not present

## 2022-08-19 DIAGNOSIS — Z0142 Encounter for cervical smear to confirm findings of recent normal smear following initial abnormal smear: Secondary | ICD-10-CM | POA: Diagnosis not present

## 2022-08-19 DIAGNOSIS — Z1389 Encounter for screening for other disorder: Secondary | ICD-10-CM | POA: Diagnosis not present

## 2022-08-19 LAB — POCT URINALYSIS DIPSTICK
Bilirubin, UA: POSITIVE
Blood, UA: NEGATIVE
Glucose, UA: NEGATIVE
Ketones, UA: NEGATIVE
Leukocytes, UA: NEGATIVE
Nitrite, UA: NEGATIVE
Protein, UA: POSITIVE — AB
Spec Grav, UA: 1.02 (ref 1.010–1.025)
Urobilinogen, UA: 1 E.U./dL
pH, UA: 6 (ref 5.0–8.0)

## 2022-08-19 NOTE — Patient Instructions (Addendum)
Continue your current medications as prescribed except for the metoprolol, you can take that once daily for now and continue keeping a close eye on your blood pressure.  Your urine does not show signs of infection today.  If you develop any symptoms, I recommend having it rechecked.  As discussed, try to increase your calorie and protein intake.  Keep your upcoming appointments  Let us know if you need anything.

## 2022-08-19 NOTE — Assessment & Plan Note (Signed)
UA negative for infection. Most likely MSK etiology. May treat conservatively, Tylenol and heating pad.

## 2022-08-19 NOTE — Assessment & Plan Note (Signed)
Continue Lexapro 20 mg daily and alprazolam prn. Recommend close follow up with psychiatrist. Discussed emotional toll of recent surgery and hospitalization.

## 2022-08-19 NOTE — Progress Notes (Unsigned)
Subjective:     Patient ID: Krista Fisher, female    DOB: 1976-06-15, 46 y.o.   MRN: [id number]  Chief Complaint  Patient presents with   Hospitalization Follow-up    Thinks she is healing and progressing fine, is just extremely tired, cant sleep.   Mid-lower back pain, states in the hospital her urine was elevated.     HPI Patient is in today for TCM visit following a 7 day hospitalization for strangulated hernia and small bowel obstruction. She is S/p lap with reduction of internal hernia and small bowel resection on 08/08/2022.   She was discharged on 08/14/2022  Hx of gastric sleeve in 2010 and biliopancreatic diversion with duodenal switch in 2021.   Reports having bowel movements, last one this morning. No pain. No blood.  Denies fever, chills, dizziness, chest pain, palpitations, shortness of breath, cough, vomiting or diarrhea, or urinary symptoms.   Concerns today include being started on a medication for HTN. She reports a hx of HTN but has not needed medication in years.  Taking metoprolol 25 mg once daily for now which seems to be helping with BP.   Pain is manageable per patient. Taking Tylenol 1,000 mg bid And Oxycodone 5 mg   Having nightmares and waking up sore after sleeping.   Hx of anxiety and depression and has a psychiatrist that she sees monthly.  Currently having anxiety about eating due to recent bowel obstruction and surgery.   States she has a new patient visit with Dr. Volanda Napoleon, PCP, tomorrow  [facility] appt is Thursday   No PT/OT as outpatient and states she does not need these services currently.   Her mother is staying with her and helping   Health Maintenance Due  Topic Date Due   PAP SMEAR-Modifier  02/04/2015    Past Medical History:  Diagnosis Date   GERD (gastroesophageal reflux disease)    Hypertension     Past Surgical History:  Procedure Laterality Date   CESAREAN SECTION     CHOLECYSTECTOMY     GASTRECTOMY     GASTRIC  BYPASS     IR ANGIOGRAM PELVIS SELECTIVE OR SUPRASELECTIVE  09/06/2020   IR ANGIOGRAM SELECTIVE EACH ADDITIONAL VESSEL  09/06/2020   IR ANGIOGRAM SELECTIVE EACH ADDITIONAL VESSEL  09/06/2020   IR EMBO TUMOR ORGAN ISCHEMIA INFARCT INC GUIDE ROADMAPPING  09/06/2020   IR RADIOLOGIST EVAL & MGMT  07/11/2020   IR RADIOLOGIST EVAL & MGMT  09/19/2020   IR RADIOLOGIST EVAL & MGMT  05/08/2021   IR US GUIDE VASC ACCESS LEFT  09/06/2020   KNEE SURGERY     LAPAROSCOPY N/A 08/08/2022   Procedure: LAPAROSCOPY DIAGNOSTIC;  Surgeon: Felicie Morn, MD;  Location: [facility];  Service: General;  Laterality: N/A;   LAPAROTOMY N/A 08/08/2022   Procedure: EXPLORATORY LAPAROTOMY;  Surgeon: Felicie Morn, MD;  Location: [facility];  Service: General;  Laterality: N/A;   WRIST FRACTURE SURGERY      Family History  Problem Relation Age of Onset   Learning disabilities Father    Hypertension Father    Hyperlipidemia Father    Heart disease Father    Heart failure Father    Diabetes Father    Learning disabilities Son    Depression Son    Depression Paternal Uncle    Drug abuse Paternal Uncle    Early death Paternal Uncle    Diabetes Maternal Grandmother    Intellectual disability Maternal Grandmother    Hypertension Maternal Grandmother  Hyperlipidemia Maternal Grandmother    Heart disease Maternal Grandmother    Diabetes Paternal Grandmother    Colon cancer Neg Hx    Stomach cancer Neg Hx    Esophageal cancer Neg Hx    Pancreatic cancer Neg Hx     Social History   Socioeconomic History   Marital status: Married    Spouse name: Not on file   Number of children: Not on file   Years of education: Not on file   Highest education level: Not on file  Occupational History   Not on file  Tobacco Use   Smoking status: Never   Smokeless tobacco: Never  Vaping Use   Vaping Use: Never used  Substance and Sexual Activity   Alcohol use: No   Drug use: No   Sexual activity: Not on file  Other Topics  Concern   Not on file  Social History Narrative   Not on file   Social Determinants of Health   Financial Resource Strain: Not on file  Food Insecurity: Not on file  Transportation Needs: Not on file  Physical Activity: Not on file  Stress: Not on file  Social Connections: Not on file  Intimate Partner Violence: Not on file    Outpatient Medications Prior to Visit  Medication Sig Dispense Refill   acetaminophen (TYLENOL) 500 MG tablet Take 500 mg by mouth every 6 (six) hours as needed.     ALPRAZolam (XANAX) 0.5 MG tablet Take 1 tablet by mouth daily as needed for severe anxiety/panic attack. 20 tablet 0   Calcium Carb-Cholecalciferol 600-10 MG-MCG TABS Take 1 tablet by mouth daily.     cetirizine (ZYRTEC) 10 MG tablet Take 10 mg by mouth daily.     COLLAGEN PO Take 1 tablet by mouth daily.      escitalopram (LEXAPRO) 20 MG tablet Take 1 tablet (20 mg total) by mouth daily. 90 tablet 0   etonogestrel (NEXPLANON) 68 MG IMPL implant 1 each by Subdermal route once.     ferrous sulfate 325 (65 FE) MG tablet Take 325 mg by mouth daily with breakfast.     fluticasone (FLONASE) 50 MCG/ACT nasal spray Place 1 spray into both nostrils daily.     Ginger, Zingiber officinalis, (GINGER PO) Take 1 tablet by mouth daily.     hydrOXYzine (ATARAX) 50 MG tablet take 1 tablet (50 mg) by mouth up to 3 times per day as needed 90 tablet 2   magnesium 30 MG tablet Take 30 mg by mouth 2 (two) times daily.     methocarbamol (ROBAXIN) 500 MG tablet Take 1 tablet by mouth every 8 hours as needed for muscle spasms. 30 tablet 0   metoprolol tartrate (LOPRESSOR) 25 MG tablet Take 1 tablet by mouth 2 times daily. 60 tablet 0   Multiple Vitamins-Iron (MULTIVITAMINS WITH IRON) TABS tablet Take 1 tablet by mouth daily.     omeprazole (PRILOSEC) 40 MG capsule Take 1 capsule (40 mg total) by mouth daily before a meal. 30 capsule 3   oxyCODONE (OXY IR/ROXICODONE) 5 MG immediate release tablet Take 1 tablet by mouth  every 6 hours as needed for severe pain. 20 tablet 0   Turmeric (QC TUMERIC COMPLEX PO) Take 1 tablet by mouth daily.     Vitamin A (A-25 PO) Take 1 tablet by mouth daily.     zinc gluconate 50 MG tablet Take 50 mg by mouth daily.     ondansetron (ZOFRAN-ODT) 4 MG disintegrating tablet Dissolve 1  tablet by mouth every 6 hours as needed for nausea. (Patient not taking: Reported on 08/19/2022) 20 tablet 0   acetaminophen (TYLENOL) 325 MG tablet Take 2 tablets (650 mg total) by mouth every 6 (six) hours as needed for mild pain. (Patient not taking: Reported on 08/18/2022)     No facility-administered medications prior to visit.    Allergies  Allergen Reactions   Lamotrigine Other (See Comments)    Patient had headaches, abdominal cramping   Wellbutrin Xl [Bupropion] Other (See Comments)    Abdominal pain   Latex Rash and Other (See Comments)    Burning    ROS     Objective:    Physical Exam Constitutional:      General: She is not in acute distress. HENT:     Nose:      Comments: Dried scab on right external nostril     Mouth/Throat:     Mouth: Mucous membranes are moist.  Cardiovascular:     Rate and Rhythm: Normal rate and regular rhythm.  Pulmonary:     Effort: Pulmonary effort is normal.     Breath sounds: Normal breath sounds.  Abdominal:       Comments: Well healing incision with staples. No drainage   Musculoskeletal:     Cervical back: Normal range of motion and neck supple.  Skin:    General: Skin is warm and dry.  Neurological:     General: No focal deficit present.     Mental Status: She is alert and oriented to person, place, and time.     Cranial Nerves: No cranial nerve deficit.  Psychiatric:        Mood and Affect: Mood normal.        Behavior: Behavior normal.        Thought Content: Thought content normal.     BP 128/72 (BP Location: Left Arm, Patient Position: Sitting, Cuff Size: Large)   Pulse 90   Temp 97.6 F (36.4 C) (Temporal)   Ht '5\' 6"'$   (1.676 m)   Wt 263 lb (119.3 kg)   SpO2 98%   BMI 42.45 kg/m  Wt Readings from Last 3 Encounters:  08/19/22 263 lb (119.3 kg)  08/11/22 281 lb 12 oz (127.8 kg)  03/13/22 275 lb 12.8 oz (125.1 kg)       Assessment & Plan:   Problem List Items Addressed This Visit       Cardiovascular and Mediastinum   HYPERTENSION, BENIGN ESSENTIAL    She will continue on metoprolol 25 mg once daily and monitor BP at home.         Other   Acute bilateral low back pain without sciatica    UA negative for infection. Most likely MSK etiology. May treat conservatively, Tylenol and heating pad.       Relevant Orders   POCT urinalysis dipstick (Completed)   Anxiety    Continue Lexapro 20 mg daily and alprazolam prn. Recommend close follow up with psychiatrist. Discussed emotional toll of recent surgery and hospitalization.       Hospital discharge follow-up - Primary    Reviewed notes from ED, pre-surgical note, post op note and discharge summary (not completed at time of my visit). Medications all reviewed with patient.  She is improving. At home with mother helping. No current need for PT or OT.  Encouraged improving hydration and nutrition. Recommend getting more protein and calories in general.  Pain is being managed with Tylenol 1,000 mg bid and  oxycodone 5 mg She has an appt with her new PCP tomorrow and with [facility] in 2 days.       S/P hernia surgery    She has f/u with [facility] this week.       S/P small bowel resection    Having bowel movements. No sign of infection today. She has a f/u with [facility] in 2 days.        I am having Miosotis Najarro maintain her COLLAGEN PO, multivitamins with iron, fluticasone, Calcium Carb-Cholecalciferol, cetirizine, Nexplanon, ferrous sulfate, zinc gluconate, magnesium, Vitamin A (A-25 PO), Turmeric (QC TUMERIC COMPLEX PO), (Ginger, Zingiber officinalis, (GINGER PO)), hydrOXYzine, omeprazole, escitalopram, ALPRAZolam, methocarbamol, ondansetron, oxyCODONE,  metoprolol tartrate, and acetaminophen.  No orders of the defined types were placed in this encounter.

## 2022-08-19 NOTE — Assessment & Plan Note (Signed)
Having bowel movements. No sign of infection today. She has a f/u with [facility] in 2 days.

## 2022-08-19 NOTE — Assessment & Plan Note (Signed)
She will continue on metoprolol 25 mg once daily and monitor BP at home.

## 2022-08-19 NOTE — Assessment & Plan Note (Signed)
She has f/u with [facility] this week.

## 2022-08-19 NOTE — Assessment & Plan Note (Signed)
Reviewed notes from ED, pre-surgical note, post op note and discharge summary (not completed at time of my visit). Medications all reviewed with patient.  She is improving. At home with mother helping. No current need for PT or OT.  Encouraged improving hydration and nutrition. Recommend getting more protein and calories in general.  Pain is being managed with Tylenol 1,000 mg bid and oxycodone 5 mg She has an appt with her new PCP tomorrow and with [facility] in 2 days.

## 2022-08-20 ENCOUNTER — Other Ambulatory Visit (HOSPITAL_COMMUNITY): Payer: Self-pay | Admitting: Physician Assistant

## 2022-08-20 ENCOUNTER — Encounter: Payer: Self-pay | Admitting: Family Medicine

## 2022-08-20 ENCOUNTER — Ambulatory Visit (INDEPENDENT_AMBULATORY_CARE_PROVIDER_SITE_OTHER): Payer: 59 | Admitting: Family Medicine

## 2022-08-20 VITALS — BP 98/62 | HR 66 | Temp 99.3°F | Ht 65.75 in | Wt 263.4 lb

## 2022-08-20 DIAGNOSIS — K219 Gastro-esophageal reflux disease without esophagitis: Secondary | ICD-10-CM

## 2022-08-20 DIAGNOSIS — F419 Anxiety disorder, unspecified: Secondary | ICD-10-CM

## 2022-08-20 DIAGNOSIS — D1803 Hemangioma of intra-abdominal structures: Secondary | ICD-10-CM | POA: Diagnosis not present

## 2022-08-20 DIAGNOSIS — Z8659 Personal history of other mental and behavioral disorders: Secondary | ICD-10-CM | POA: Diagnosis not present

## 2022-08-20 DIAGNOSIS — Z9049 Acquired absence of other specified parts of digestive tract: Secondary | ICD-10-CM | POA: Diagnosis not present

## 2022-08-20 DIAGNOSIS — I10 Essential (primary) hypertension: Secondary | ICD-10-CM | POA: Diagnosis not present

## 2022-08-20 NOTE — Patient Instructions (Addendum)
It was nice meeting you today.  Your liver enzymes are now normal.  They have been normal since your hospitalization.  Per review of the imaging you previously had done it appears that the area in the left lobe of your liver is an area of fatty infiltration.  There were several centimeter hemangiomas likely present in the right lobe of your liver.  As your appetite has yet to improve fully, consider taking half a tab of metoprolol tartrate 25 mg for dose of 12.5 mg daily.  As we discussed this is typically a twice a day medication so it is important that you monitor your blood pressure in the afternoon/evening to see if you need the additional dose.  As her appetite picks up you will likely notice a change in her blood pressure.  If you notice elevations consistently greater than 140/90 notify the clinic.   We will have you follow-up in the next 4-6 weeks, sooner if needed for blood pressure.

## 2022-08-20 NOTE — Progress Notes (Signed)
Subjective:    Patient ID: Krista Fisher, female    DOB: 08-May-1976, 46 y.o.   MRN: [id number]  Chief Complaint  Patient presents with   Establish Care    F/u from [facility]- has been rough, still working on nutrition, output is better than intake.    HPI Patient is a 46 yo female with pmh sig for HTN, h/o gastric sleeve 2012, GERD, anxiety, obesity who was seen today for TOC from Clementeen Hoof, FNP and f/u on ongoing concerns.  Pt admitted 08/08/2022-08/14/2022 for strangulated hernia and SBO s/p lap reduction and SB resection on 8/18.  Patient states she is feeling better since discharge.  Had a OB/Gyn appointment yesterday.  Has upcoming appointments with surgery and GI.  Endorses regular BMs and flatus, not having to strain.  Endorses GERD with all foods.   Past Medical History:  Diagnosis Date   GERD (gastroesophageal reflux disease)    Hypertension     Allergies  Allergen Reactions   Lamotrigine Other (See Comments)    Patient had headaches, abdominal cramping   Wellbutrin Xl [Bupropion] Other (See Comments)    Abdominal pain   Latex Rash and Other (See Comments)    Burning    ROS General: Denies fever, chills, night sweats, changes in weight +changes in appetite HEENT: Denies headaches, ear pain, changes in vision, rhinorrhea, sore throat CV: Denies CP, palpitations, SOB, orthopnea Pulm: Denies SOB, cough, wheezing GI: Denies abdominal pain, nausea, vomiting, diarrhea, constipation  +abd pain GU: Denies dysuria, hematuria, frequency, vaginal discharge Msk: Denies muscle cramps, joint pains Neuro: Denies weakness, numbness, tingling Skin: Denies rashes, bruising Psych: Denies depression, anxiety, hallucinations  +anxiety     Objective:    Blood pressure 98/62, pulse 66, temperature 99.3 F (37.4 C), temperature source Oral, height 5' 5.75" (1.67 m), weight 263 lb 6.4 oz (119.5 kg), SpO2 97 %.  Gen. Pleasant, well-nourished, in no distress, anxious HEENT: /AT,  face symmetric, conjunctiva clear, no scleral icterus, PERRLA, EOMI, nares patent without drainage, Lungs: no accessory muscle use, CTAB, no wheezes or rales Cardiovascular: RRR, no m/r/g, no peripheral edema Abdomen: BS present, soft, mild TTP,ND, no hepatosplenomegaly. Musculoskeletal: No deformities, no cyanosis or clubbing, normal tone Neuro:  A&Ox3, CN II-XII intact, normal gait Skin:  Warm, no lesions/ rash   Wt Readings from Last 3 Encounters:  08/19/22 263 lb (119.3 kg)  08/11/22 281 lb 12 oz (127.8 kg)  03/13/22 275 lb 12.8 oz (125.1 kg)    Lab Results  Component Value Date   WBC 12.7 (H) 08/13/2022   HGB 10.4 (L) 08/13/2022   HCT 29.5 (L) 08/13/2022   PLT 227 08/13/2022   GLUCOSE 107 (H) 08/14/2022   CHOL 145 10/31/2009   TRIG 70 08/14/2022   HDL 37 (L) 10/31/2009   LDLCALC 91 10/31/2009   ALT 24 08/14/2022   AST 33 08/14/2022   NA 138 08/14/2022   K 4.2 08/14/2022   CL 106 08/14/2022   CREATININE 0.76 08/14/2022   BUN 8 08/14/2022   CO2 26 08/14/2022   TSH 2.59 03/13/2022   INR 1.0 09/06/2020   HGBA1C 5.0 08/08/2022   Assessment/Plan:  Status post small bowel resection -stable -advance diet as tolerated -keep f/u with gen surg and GI  Essential hypertension -low normal bp.  [place] 2/2 decreased po intake s/p bowel resction for SBO -pt advised to monitor bo at home -consider taking half tab metoprolol 25 mg (12.5 mg dose) until appetite improves  Anxiety -increased as  concerned about continued bowel symptoms. -f/u with [facility] -self care  History of depression -f/u with [facility]  Gastroesophageal reflux disease, unspecified whether esophagitis present -avoid foods known to cause symptoms -continue omperazole 20 mg -f/u with GI  Hemangioma of liver -noted on imaging -continue to monitor  F/u prn  Grier Mitts, MD

## 2022-08-21 ENCOUNTER — Other Ambulatory Visit (HOSPITAL_COMMUNITY): Payer: Self-pay

## 2022-08-21 MED ORDER — OXYCODONE HCL 5 MG PO TABS
5.0000 mg | ORAL_TABLET | Freq: Four times a day (QID) | ORAL | 0 refills | Status: DC | PRN
  Filled 2022-08-21: qty 15, 4d supply, fill #0

## 2022-08-23 ENCOUNTER — Other Ambulatory Visit (HOSPITAL_COMMUNITY): Payer: Self-pay

## 2022-08-27 ENCOUNTER — Ambulatory Visit: Payer: 59 | Admitting: Skilled Nursing Facility1

## 2022-09-05 ENCOUNTER — Ambulatory Visit: Payer: 59 | Admitting: Nurse Practitioner

## 2022-09-05 NOTE — Progress Notes (Deleted)
Chief Complaint:  ***   Assessment &  Plan      HPI   Krista Fisher is a 46 y.o. female known to Dr.  Marden Noble with a past medical history significant for bariatric surgery ( duodenal switch). See PMH /PSH for additional history   Krista Fisher was last seen in 2021 for evaluation of rectal pain. Treated empirically for anal fissure and symptoms resolved.   Patient is referred by PCP for evaluation of elevated LFTs   Interval History:      Previous GI Evaluation      Imaging     Labs:     Latest Ref Rng & Units 08/13/2022    9:23 AM 08/12/2022    2:28 AM 08/11/2022    7:01 AM  CBC  WBC 4.0 - 10.5 K/uL 12.7  13.3  17.9   Hemoglobin 12.0 - 15.0 g/dL 10.4  10.2  11.8   Hematocrit 36.0 - 46.0 % 29.5  28.4  32.0   Platelets 150 - 400 K/uL 227  197  172        Latest Ref Rng & Units 08/14/2022    5:40 AM 08/08/2022    3:59 AM 03/13/2022   11:44 AM  Hepatic Function  Total Protein 6.5 - 8.1 g/dL 5.7  7.6  7.1   Albumin 3.5 - 5.0 g/dL 2.1  4.0  4.2   AST 15 - 41 U/L 33  50  80   ALT 0 - 44 U/L 24  33  76   Alk Phosphatase 38 - 126 U/L 101  149  188   Total Bilirubin 0.3 - 1.2 mg/dL 0.4  0.6  0.4      Past Medical History:  Diagnosis Date   GERD (gastroesophageal reflux disease)    Hypertension     Past Surgical History:  Procedure Laterality Date   CESAREAN SECTION     CHOLECYSTECTOMY     GASTRECTOMY     GASTRIC BYPASS     IR ANGIOGRAM PELVIS SELECTIVE OR SUPRASELECTIVE  09/06/2020   IR ANGIOGRAM SELECTIVE EACH ADDITIONAL VESSEL  09/06/2020   IR ANGIOGRAM SELECTIVE EACH ADDITIONAL VESSEL  09/06/2020   IR EMBO TUMOR ORGAN ISCHEMIA INFARCT INC GUIDE ROADMAPPING  09/06/2020   IR RADIOLOGIST EVAL & MGMT  07/11/2020   IR RADIOLOGIST EVAL & MGMT  09/19/2020   IR RADIOLOGIST EVAL & MGMT  05/08/2021   IR US GUIDE VASC ACCESS LEFT  09/06/2020   KNEE SURGERY     LAPAROSCOPY N/A 08/08/2022   Procedure: LAPAROSCOPY DIAGNOSTIC;  Surgeon: Krista Morn, MD;   Location: [facility];  Service: General;  Laterality: N/A;   LAPAROTOMY N/A 08/08/2022   Procedure: EXPLORATORY LAPAROTOMY;  Surgeon: Krista Morn, MD;  Location: [facility];  Service: General;  Laterality: N/A;   WRIST FRACTURE SURGERY      Current Medications, Allergies, Family History and Social History were reviewed in [facility] record.     Current Outpatient Medications  Medication Sig Dispense Refill   acetaminophen (TYLENOL) 500 MG tablet Take 500 mg by mouth every 6 (six) hours as needed.     ALPRAZolam (XANAX) 0.5 MG tablet Take 1 tablet by mouth daily as needed for severe anxiety/panic attack. 20 tablet 0   Calcium Carb-Cholecalciferol 600-10 MG-MCG TABS Take 1 tablet by mouth daily.     cetirizine (ZYRTEC) 10 MG tablet Take 10 mg by mouth daily.     COLLAGEN PO Take 1 tablet by  mouth daily.      escitalopram (LEXAPRO) 20 MG tablet Take 1 tablet (20 mg total) by mouth daily. 90 tablet 0   etonogestrel (NEXPLANON) 68 MG IMPL implant 1 each by Subdermal route once.     ferrous sulfate 325 (65 FE) MG tablet Take 325 mg by mouth daily with breakfast.     fluticasone (FLONASE) 50 MCG/ACT nasal spray Place 1 spray into both nostrils daily.     Ginger, Zingiber officinalis, (GINGER PO) Take 1 tablet by mouth daily.     hydrOXYzine (ATARAX) 50 MG tablet take 1 tablet (50 mg) by mouth up to 3 times per day as needed 90 tablet 2   magnesium 30 MG tablet Take 30 mg by mouth 2 (two) times daily.     methocarbamol (ROBAXIN) 500 MG tablet Take 1 tablet by mouth every 8 hours as needed for muscle spasms. 30 tablet 0   metoprolol tartrate (LOPRESSOR) 25 MG tablet Take 1 tablet by mouth 2 times daily. (Patient taking differently: Take 25 mg by mouth daily.) 60 tablet 0   Multiple Vitamins-Iron (MULTIVITAMINS WITH IRON) TABS tablet Take 1 tablet by mouth daily.     omeprazole (PRILOSEC) 40 MG capsule Take 1 capsule (40 mg total) by mouth daily before a meal. 30 capsule 3    ondansetron (ZOFRAN-ODT) 4 MG disintegrating tablet Dissolve 1 tablet by mouth every 6 hours as needed for nausea. 20 tablet 0   oxyCODONE (OXY IR/ROXICODONE) 5 MG immediate release tablet Take 1 tablet by mouth every 6 hours as needed for severe pain. 15 tablet 0   Turmeric (QC TUMERIC COMPLEX PO) Take 1 tablet by mouth daily.     Vitamin A (A-25 PO) Take 1 tablet by mouth daily.     zinc gluconate 50 MG tablet Take 50 mg by mouth daily.     No current facility-administered medications for this visit.    Review of Systems: No chest pain. No shortness of breath. No urinary complaints.    Physical Exam  Wt Readings from Last 3 Encounters:  08/20/22 263 lb 6.4 oz (119.5 kg)  08/19/22 263 lb (119.3 kg)  08/11/22 281 lb 12 oz (127.8 kg)    There were no vitals taken for this visit. Constitutional:  Generally well appearing ***female in no acute distress. Psychiatric: Pleasant. Normal mood and affect. Behavior is normal. EENT: Pupils normal.  Conjunctivae are normal. No scleral icterus. Neck supple.  Cardiovascular: Normal rate, regular rhythm. No edema Pulmonary/chest: Effort normal and breath sounds normal. No wheezing, rales or rhonchi. Abdominal: Soft, nondistended, nontender. Bowel sounds active throughout. There are no masses palpable. No hepatomegaly. Neurological: Alert and oriented to person place and time. Skin: Skin is warm and dry. No rashes noted.  Krista Savoy, NP  09/05/2022, 1:40 PM  Cc:  Krista Fisher,*

## 2022-09-07 ENCOUNTER — Other Ambulatory Visit (HOSPITAL_COMMUNITY): Payer: Self-pay

## 2022-09-08 ENCOUNTER — Other Ambulatory Visit (HOSPITAL_COMMUNITY): Payer: Self-pay

## 2022-09-10 DIAGNOSIS — F4323 Adjustment disorder with mixed anxiety and depressed mood: Secondary | ICD-10-CM | POA: Diagnosis not present

## 2022-09-10 DIAGNOSIS — Z630 Problems in relationship with spouse or partner: Secondary | ICD-10-CM | POA: Diagnosis not present

## 2022-09-10 DIAGNOSIS — F4312 Post-traumatic stress disorder, chronic: Secondary | ICD-10-CM | POA: Diagnosis not present

## 2022-09-12 ENCOUNTER — Ambulatory Visit (INDEPENDENT_AMBULATORY_CARE_PROVIDER_SITE_OTHER): Payer: 59 | Admitting: Family Medicine

## 2022-09-12 VITALS — BP 92/68 | HR 97 | Temp 98.7°F | Wt 257.0 lb

## 2022-09-12 DIAGNOSIS — I959 Hypotension, unspecified: Secondary | ICD-10-CM | POA: Diagnosis not present

## 2022-09-12 DIAGNOSIS — F411 Generalized anxiety disorder: Secondary | ICD-10-CM

## 2022-09-12 DIAGNOSIS — R002 Palpitations: Secondary | ICD-10-CM | POA: Diagnosis not present

## 2022-09-12 DIAGNOSIS — Z9049 Acquired absence of other specified parts of digestive tract: Secondary | ICD-10-CM

## 2022-09-12 NOTE — Progress Notes (Signed)
Subjective:    Patient ID: Krista Fisher, female    DOB: Dec 22, 1976, 46 y.o.   MRN: [id number]  Chief Complaint  Patient presents with   Irregular Heart Beat    Wed night started had 2 irregular beats and after the 2nd, it was taking longer to regulate.     HPI Patient is a 46 year old female with pmh sig for GERD, HTN, GAD, obesity s/p gastric bypass, vitamin D deficiency, fibroids, OSA, s/p SB resection who was seen today for f/u on acute concern.  Pt endorses palpitations x 5 min 2 nights ago.  Happened in the past, but lasting longer.  States heart rate was 115 and BP 88/55.  Patient endorses increased anxiety.  Denies increased caffeine intake.  Trying to increase p.o. intake of protein by eating eggs, protein shakes, ground beef and pork.  Past Medical History:  Diagnosis Date   GERD (gastroesophageal reflux disease)    Hypertension     Allergies  Allergen Reactions   Lamotrigine Other (See Comments)    Patient had headaches, abdominal cramping   Wellbutrin Xl [Bupropion] Other (See Comments)    Abdominal pain   Latex Rash and Other (See Comments)    Burning    ROS General: Denies fever, chills, night sweats, changes in weight, changes in appetite HEENT: Denies headaches, ear pain, changes in vision, rhinorrhea, sore throat CV: Denies C, SOB, orthopnea + palpitations Pulm: Denies SOB, cough, wheezing GI: Denies abdominal pain, nausea, vomiting, diarrhea, constipation GU: Denies dysuria, hematuria, frequency, vaginal discharge Msk: Denies muscle cramps, joint pains Neuro: Denies weakness, numbness, tingling Skin: Denies rashes, bruising Psych: Denies depression, anxiety, hallucinations + anxiety  Objective:    Blood pressure 92/68, pulse 97, temperature 98.7 F (37.1 C), temperature source Oral, weight 257 lb (116.6 kg), SpO2 97 %.  Gen. Pleasant, well-nourished, in no distress, normal affect   HEENT: [place]/AT, face symmetric, conjunctiva clear, no scleral icterus,  PERRLA, EOMI, nares patent without drainage Lungs: no accessory muscle use, CTAB, no wheezes or rales Cardiovascular: RRR, no m/r/g, no peripheral edema Abdomen: BS present, soft, NT/ND, no hepatosplenomegaly. Neuro:  A&Ox3, CN II-XII intact, normal gait Skin:  Warm, no lesions/ rash  Wt Readings from Last 3 Encounters:  09/12/22 257 lb (116.6 kg)  08/20/22 263 lb 6.4 oz (119.5 kg)  08/19/22 263 lb (119.3 kg)   Lab Results  Component Value Date   WBC 12.7 (H) 08/13/2022   HGB 10.4 (L) 08/13/2022   HCT 29.5 (L) 08/13/2022   PLT 227 08/13/2022   GLUCOSE 107 (H) 08/14/2022   CHOL 145 10/31/2009   TRIG 70 08/14/2022   HDL 37 (L) 10/31/2009   LDLCALC 91 10/31/2009   ALT 24 08/14/2022   AST 33 08/14/2022   NA 138 08/14/2022   K 4.2 08/14/2022   CL 106 08/14/2022   CREATININE 0.76 08/14/2022   BUN 8 08/14/2022   CO2 26 08/14/2022   TSH 2.59 03/13/2022   INR 1.0 09/06/2020   HGBA1C 5.0 08/08/2022      09/12/2022    3:34 PM 08/20/2022    1:33 PM 05/02/2022    9:50 AM  Depression screen PHQ 2/9  Decreased Interest 1 2   Down, Depressed, Hopeless 2 2   PHQ - 2 Score 3 4   Altered sleeping 3 3   Tired, decreased energy 3 3   Change in appetite 3 1   Feeling bad or failure about yourself  0 2   Trouble concentrating 1 1  Moving slowly or fidgety/restless 0 0   Suicidal thoughts 0 0   PHQ-9 Score 13 14   Difficult doing work/chores Very difficult Very difficult      Information is confidential and restricted. Go to Review Flowsheets to unlock data.    Assessment/Plan:  Palpitations -New problem -Discussed possible causes including increased anxiety, electrolyte abnormality, thyroid dysfunction -Obtain labs -Discussed self-care and ways to reduce stress -Continue counseling -For continued or worsening symptoms consider Holter monitor and cardiology follow-up -Given handout  - Plan: TSH, T4, Free, Vitamin B12, CBC with Differential/Platelet, CMP  GAD (generalized  anxiety disorder) -Discussed deep breathing and other techniques to reduce anxiety -continuing counseling -Continue self-care -Continue current medications including Xanax, Lexapro -Given handout - Plan: TSH, T4, Free  F/u as needed  Grier Mitts, MD

## 2022-09-13 LAB — COMPREHENSIVE METABOLIC PANEL
AG Ratio: 1.2 (calc) (ref 1.0–2.5)
ALT: 28 U/L (ref 6–29)
AST: 37 U/L — ABNORMAL HIGH (ref 10–35)
Albumin: 3.9 g/dL (ref 3.6–5.1)
Alkaline phosphatase (APISO): 149 U/L — ABNORMAL HIGH (ref 31–125)
BUN: 11 mg/dL (ref 7–25)
CO2: 27 mmol/L (ref 20–32)
Calcium: 9.3 mg/dL (ref 8.6–10.2)
Chloride: 107 mmol/L (ref 98–110)
Creat: 0.94 mg/dL (ref 0.50–0.99)
Globulin: 3.2 g/dL (calc) (ref 1.9–3.7)
Glucose, Bld: 91 mg/dL (ref 65–99)
Potassium: 4.5 mmol/L (ref 3.5–5.3)
Sodium: 141 mmol/L (ref 135–146)
Total Bilirubin: 0.4 mg/dL (ref 0.2–1.2)
Total Protein: 7.1 g/dL (ref 6.1–8.1)

## 2022-09-13 LAB — CBC WITH DIFFERENTIAL/PLATELET
Absolute Monocytes: 592 cells/uL (ref 200–950)
Basophils Absolute: 38 cells/uL (ref 0–200)
Basophils Relative: 0.6 %
Eosinophils Absolute: 221 cells/uL (ref 15–500)
Eosinophils Relative: 3.5 %
HCT: 38.7 % (ref 35.0–45.0)
Hemoglobin: 12.5 g/dL (ref 11.7–15.5)
Lymphs Abs: 2085 cells/uL (ref 850–3900)
MCH: 26.3 pg — ABNORMAL LOW (ref 27.0–33.0)
MCHC: 32.3 g/dL (ref 32.0–36.0)
MCV: 81.5 fL (ref 80.0–100.0)
MPV: 11.3 fL (ref 7.5–12.5)
Monocytes Relative: 9.4 %
Neutro Abs: 3364 cells/uL (ref 1500–7800)
Neutrophils Relative %: 53.4 %
Platelets: 255 10*3/uL (ref 140–400)
RBC: 4.75 10*6/uL (ref 3.80–5.10)
RDW: 14.9 % (ref 11.0–15.0)
Total Lymphocyte: 33.1 %
WBC: 6.3 10*3/uL (ref 3.8–10.8)

## 2022-09-13 LAB — T4, FREE: Free T4: 1 ng/dL (ref 0.8–1.8)

## 2022-09-13 LAB — VITAMIN B12: Vitamin B-12: >2000 pg/mL — ABNORMAL HIGH (ref 200–1100)

## 2022-09-13 LAB — TSH: TSH: 2.8 mIU/L

## 2022-09-15 DIAGNOSIS — F4323 Adjustment disorder with mixed anxiety and depressed mood: Secondary | ICD-10-CM | POA: Diagnosis not present

## 2022-09-15 DIAGNOSIS — Z630 Problems in relationship with spouse or partner: Secondary | ICD-10-CM | POA: Diagnosis not present

## 2022-09-15 DIAGNOSIS — F4312 Post-traumatic stress disorder, chronic: Secondary | ICD-10-CM | POA: Diagnosis not present

## 2022-09-16 ENCOUNTER — Encounter: Payer: Self-pay | Admitting: Family Medicine

## 2022-09-16 ENCOUNTER — Other Ambulatory Visit: Payer: Self-pay | Admitting: Family Medicine

## 2022-09-16 DIAGNOSIS — R748 Abnormal levels of other serum enzymes: Secondary | ICD-10-CM

## 2022-09-17 ENCOUNTER — Ambulatory Visit: Payer: 59 | Admitting: Skilled Nursing Facility1

## 2022-09-23 ENCOUNTER — Other Ambulatory Visit: Payer: Self-pay

## 2022-09-23 ENCOUNTER — Emergency Department (HOSPITAL_COMMUNITY)
Admission: EM | Admit: 2022-09-23 | Discharge: 2022-09-23 | Disposition: A | Discharge status: Home / Self Care | Payer: 59 | Attending: Emergency Medicine | Admitting: Emergency Medicine

## 2022-09-23 ENCOUNTER — Emergency Department (HOSPITAL_COMMUNITY): Payer: 59

## 2022-09-23 ENCOUNTER — Encounter (HOSPITAL_COMMUNITY): Payer: Self-pay | Admitting: Emergency Medicine

## 2022-09-23 DIAGNOSIS — K5909 Other constipation: Secondary | ICD-10-CM | POA: Insufficient documentation

## 2022-09-23 DIAGNOSIS — I10 Essential (primary) hypertension: Secondary | ICD-10-CM | POA: Diagnosis not present

## 2022-09-23 DIAGNOSIS — R1013 Epigastric pain: Secondary | ICD-10-CM | POA: Diagnosis not present

## 2022-09-23 DIAGNOSIS — Z91040 Latex allergy status: Secondary | ICD-10-CM | POA: Insufficient documentation

## 2022-09-23 DIAGNOSIS — R1010 Upper abdominal pain, unspecified: Secondary | ICD-10-CM | POA: Diagnosis not present

## 2022-09-23 DIAGNOSIS — K5900 Constipation, unspecified: Secondary | ICD-10-CM | POA: Diagnosis not present

## 2022-09-23 DIAGNOSIS — K219 Gastro-esophageal reflux disease without esophagitis: Secondary | ICD-10-CM | POA: Diagnosis not present

## 2022-09-23 DIAGNOSIS — D259 Leiomyoma of uterus, unspecified: Secondary | ICD-10-CM | POA: Diagnosis not present

## 2022-09-23 DIAGNOSIS — R109 Unspecified abdominal pain: Secondary | ICD-10-CM | POA: Diagnosis present

## 2022-09-23 DIAGNOSIS — K56609 Unspecified intestinal obstruction, unspecified as to partial versus complete obstruction: Secondary | ICD-10-CM | POA: Diagnosis not present

## 2022-09-23 DIAGNOSIS — Z9049 Acquired absence of other specified parts of digestive tract: Secondary | ICD-10-CM | POA: Diagnosis not present

## 2022-09-23 DIAGNOSIS — R1084 Generalized abdominal pain: Secondary | ICD-10-CM | POA: Diagnosis not present

## 2022-09-23 DIAGNOSIS — N9489 Other specified conditions associated with female genital organs and menstrual cycle: Secondary | ICD-10-CM | POA: Insufficient documentation

## 2022-09-23 DIAGNOSIS — R079 Chest pain, unspecified: Secondary | ICD-10-CM | POA: Diagnosis not present

## 2022-09-23 DIAGNOSIS — Z79899 Other long term (current) drug therapy: Secondary | ICD-10-CM | POA: Diagnosis not present

## 2022-09-23 DIAGNOSIS — Z9889 Other specified postprocedural states: Secondary | ICD-10-CM | POA: Diagnosis not present

## 2022-09-23 LAB — COMPREHENSIVE METABOLIC PANEL
ALT: 35 U/L (ref 0–44)
AST: 65 U/L — ABNORMAL HIGH (ref 15–41)
Albumin: 3.5 g/dL (ref 3.5–5.0)
Alkaline Phosphatase: 141 U/L — ABNORMAL HIGH (ref 38–126)
Anion gap: 10 (ref 5–15)
BUN: 12 mg/dL (ref 6–20)
CO2: 25 mmol/L (ref 22–32)
Calcium: 9.5 mg/dL (ref 8.9–10.3)
Chloride: 105 mmol/L (ref 98–111)
Creatinine, Ser: 0.89 mg/dL (ref 0.44–1.00)
GFR, Estimated: >60 mL/min (ref >60)
Glucose, Bld: 113 mg/dL — ABNORMAL HIGH (ref 70–99)
Potassium: 4.9 mmol/L (ref 3.5–5.1)
Sodium: 140 mmol/L (ref 135–145)
Total Bilirubin: 0.4 mg/dL (ref 0.3–1.2)
Total Protein: 7.2 g/dL (ref 6.5–8.1)

## 2022-09-23 LAB — CBC WITH DIFFERENTIAL/PLATELET
Abs Immature Granulocytes: 0.02 10*3/uL (ref 0.00–0.07)
Basophils Absolute: 0 10*3/uL (ref 0.0–0.1)
Basophils Relative: 1 %
Eosinophils Absolute: 0.2 10*3/uL (ref 0.0–0.5)
Eosinophils Relative: 2 %
HCT: 37.2 % (ref 36.0–46.0)
Hemoglobin: 12.9 g/dL (ref 12.0–15.0)
Immature Granulocytes: 0 %
Lymphocytes Relative: 34 %
Lymphs Abs: 2.6 10*3/uL (ref 0.7–4.0)
MCH: 26.3 pg (ref 26.0–34.0)
MCHC: 34.7 g/dL (ref 30.0–36.0)
MCV: 75.8 fL — ABNORMAL LOW (ref 80.0–100.0)
Monocytes Absolute: 0.8 10*3/uL (ref 0.1–1.0)
Monocytes Relative: 10 %
Neutro Abs: 4.1 10*3/uL (ref 1.7–7.7)
Neutrophils Relative %: 53 %
Platelets: 160 10*3/uL (ref 150–400)
RBC: 4.91 MIL/uL (ref 3.87–5.11)
RDW: 14.1 % (ref 11.5–15.5)
WBC: 7.7 10*3/uL (ref 4.0–10.5)
nRBC: 0 % (ref 0.0–0.2)

## 2022-09-23 LAB — URINALYSIS, ROUTINE W REFLEX MICROSCOPIC
Bacteria, UA: NONE SEEN
Bilirubin Urine: NEGATIVE
Glucose, UA: NEGATIVE mg/dL
Hgb urine dipstick: NEGATIVE
Ketones, ur: NEGATIVE mg/dL
Nitrite: NEGATIVE
Protein, ur: NEGATIVE mg/dL
Specific Gravity, Urine: 1.015 (ref 1.005–1.030)
pH: 5 (ref 5.0–8.0)

## 2022-09-23 LAB — LIPASE, BLOOD: Lipase: 68 U/L — ABNORMAL HIGH (ref 11–51)

## 2022-09-23 LAB — I-STAT BETA HCG BLOOD, ED (MC, WL, AP ONLY): I-stat hCG, quantitative: <5 m[IU]/mL (ref <5)

## 2022-09-23 MED ORDER — IOHEXOL 350 MG/ML SOLN
100.0000 mL | Freq: Once | INTRAVENOUS | Status: AC | PRN
  Administered 2022-09-23: 75 mL via INTRAVENOUS

## 2022-09-23 NOTE — ED Triage Notes (Addendum)
Pt BIB [facility] c/o upper abd pain with constipation, denies n/v/d, hx of bowel obstruction in Aug 2023, last BM this morning

## 2022-09-23 NOTE — Discharge Instructions (Addendum)
Thank you for coming to [facility] Emergency Department. You were seen for abdominal pain. We did an exam, labs, and imaging, and these showed slight elevations in your liver labs as you have had before, and a slight increase in your lipase that can be associated with pancreatitis, but yours was not high enough. Your CT abdomen demonstrated a uterine leiomyoma (fatty growth) that you have had before as well as post-surgical changes. Please attend your previously scheduled postsurgical follow-up on October 18.  If possible, also follow up with your primary care provider within 1 week.   For your constipation, please continue taking the 2 capfuls of MiraLAX per day as you have to started doing.  Please stay well-hydrated and eat a high-fiber diet.  Do not hesitate to return to the ED or call 911 if you experience: -Worsening symptoms -Severe nausea/vomiting, inability to eat or drink -Blood in your stool, dark/tarry stools -Lightheadedness, passing out -Fevers/chills -Anything else that concerns you

## 2022-09-23 NOTE — ED Provider Notes (Signed)
[place] EMERGENCY DEPARTMENT Provider Note   CSN: [id number] Arrival date & time: 09/23/22  0226     History  Chief Complaint  Patient presents with   Abdominal Pain    Krista Fisher is a 46 y.o. female with status post hernia surgery, status post small bowel resection, status post biliopancreatic diversion with duodenal switch, first-degree block, fibroids, GERD, OSA, HTN, hemorrhoids/anal fissure, nonalcoholic fatty liver disease, PCOS, morbid obesity presents with abdominal pain.   This morning patient experienced acute onset severe epigastric pain radiating to her right upper quadrant and back that started at 3 AM.  She was able to use the restroom and had a normal bowel movement, and the pain decreased slowly over the next 3 hours.  She currently has no abdominal pain.  She stated that she recently had a strangulated hernia that required bowel resection a few weeks ago and that this pain felt very similar and so she became very afraid and called the ambulance.  Patient denies any fevers chills, shortness of breath, nausea vomiting diarrhea, urinary symptoms including frequency/dysuria/hematuria, vaginal symptoms including itching burning bleeding, but endorses constipation since the surgery and states she has had to strain to have a bowel movement.  She has been taking MiraLAX and a couple of days ago increased her dose to 2 capfuls per day.  Patient also endorses periodic chest pain but only during times of anxiety and states that this is normal for her.  Denies any leg swelling or shortness of breath.   Abdominal Pain      Home Medications Prior to Admission medications   Medication Sig Start Date End Date Taking? Authorizing Provider  acetaminophen (TYLENOL) 500 MG tablet Take 500 mg by mouth every 6 (six) hours as needed.    [provider]  ALPRAZolam Duanne Moron) 0.5 MG tablet Take 1 tablet by mouth daily as needed for severe anxiety/panic attack.  08/06/22   Arfeen, Arlyce Harman, MD  Calcium Carb-Cholecalciferol 600-10 MG-MCG TABS Take 1 tablet by mouth daily. 01/25/20   [provider]  cetirizine (ZYRTEC) 10 MG tablet Take 10 mg by mouth daily.    [provider]  COLLAGEN PO Take 1 tablet by mouth daily.     [provider]  escitalopram (LEXAPRO) 20 MG tablet Take 1 tablet (20 mg total) by mouth daily. 08/06/22   Arfeen, Arlyce Harman, MD  etonogestrel (NEXPLANON) 68 MG IMPL implant 1 each by Subdermal route once. 06/06/20   [provider]  ferrous sulfate 325 (65 FE) MG tablet Take 325 mg by mouth daily with breakfast.    [provider]  fluticasone (FLONASE) 50 MCG/ACT nasal spray Place 1 spray into both nostrils daily.    [provider]  Ginger, Zingiber officinalis, (GINGER PO) Take 1 tablet by mouth daily.    [provider]  hydrOXYzine (ATARAX) 50 MG tablet take 1 tablet (50 mg) by mouth up to 3 times per day as needed 10/07/21     magnesium 30 MG tablet Take 200 mg by mouth 2 (two) times daily.    [provider]  methocarbamol (ROBAXIN) 500 MG tablet Take 1 tablet by mouth every 8 hours as needed for muscle spasms. 08/14/22   Meuth, Brooke A, PA-C  metoprolol tartrate (LOPRESSOR) 25 MG tablet Take 1 tablet by mouth 2 times daily. Patient taking differently: Take 25 mg by mouth daily. 08/14/22   Meuth, Blaine Hamper, PA-C  Multiple Vitamins-Iron (MULTIVITAMINS WITH IRON) TABS tablet Take  1 tablet by mouth daily.    [provider]  omeprazole (PRILOSEC) 40 MG capsule Take 1 capsule (40 mg total) by mouth daily before a meal. 06/13/22   Terrilyn Saver, NP  ondansetron (ZOFRAN-ODT) 4 MG disintegrating tablet Dissolve 1 tablet by mouth every 6 hours as needed for nausea. 08/14/22   Meuth, Brooke A, PA-C  oxyCODONE (OXY IR/ROXICODONE) 5 MG immediate release tablet Take 1 tablet by mouth every 6 hours as needed for severe pain. 08/21/22   Meuth, Blaine Hamper, PA-C  Turmeric (QC  TUMERIC COMPLEX PO) Take 1 tablet by mouth daily.    [provider]  Vitamin A (A-25 PO) Take 1 tablet by mouth daily.    [provider]  zinc gluconate 50 MG tablet Take 50 mg by mouth daily.    [provider]      Allergies    Lamotrigine, Wellbutrin xl [bupropion], and Latex    Review of Systems   Review of Systems  Gastrointestinal:  Positive for abdominal pain.   Review of systems negative for fevers chills.  A 10 point review of systems was performed and is negative unless otherwise reported in HPI.  Physical Exam Updated Vital Signs BP 97/71   Pulse 66   Temp 98.5 F (36.9 C) (Oral)   Resp 18   Ht 5' 5.75" (1.67 m)   Wt 116.6 kg   SpO2 100%   BMI 41.80 kg/m  Physical Exam General: Normal appearing female, lying in bed.  HEENT: Sclera anicteric, MMM, trachea midline. Cardiology: RRR, no murmurs/rubs/gallops. BL radial and DP pulses equal bilaterally.  Resp: Normal respiratory rate and effort. CTAB, no wheezes, rhonchi, crackles.  Abd: Well-healing large vertical abdominal incision with no surrounding erythema, induration, fluctuance or swelling.  No abdominal tenderness palpation.  Soft, non-distended. No rebound tenderness or guarding.  GU: Deferred. MSK: No peripheral edema or signs of trauma. Extremities without deformity or TTP. No cyanosis or clubbing. Skin: warm, dry. No rashes or lesions. Back: No CVA tenderness Neuro: A&Ox4, CNs II-XII grossly intact. MAEs. Sensation grossly intact.  Psych: Normal mood and affect.   ED Results / Procedures / Treatments   Labs (all labs ordered are listed, but only abnormal results are displayed) Labs Reviewed  CBC WITH DIFFERENTIAL/PLATELET - Abnormal; Notable for the following components:      Result Value   MCV 75.8 (*)    All other components within normal limits  URINALYSIS, ROUTINE W REFLEX MICROSCOPIC - Abnormal; Notable for the following components:   APPearance HAZY (*)     Leukocytes,Ua SMALL (*)    All other components within normal limits  COMPREHENSIVE METABOLIC PANEL - Abnormal; Notable for the following components:   Glucose, Bld 113 (*)    AST 65 (*)    Alkaline Phosphatase 141 (*)    All other components within normal limits  LIPASE, BLOOD - Abnormal; Notable for the following components:   Lipase 68 (*)    All other components within normal limits  I-STAT BETA HCG BLOOD, ED ([facility], [facility], [facility] ONLY)    Radiology CT ABDOMEN PELVIS W CONTRAST  Result Date: 09/23/2022 CLINICAL DATA:  Suspect bowel obstruction. EXAM: CT ABDOMEN AND PELVIS WITH CONTRAST TECHNIQUE: Multidetector CT imaging of the abdomen and pelvis was performed using the standard protocol following bolus administration of intravenous contrast. RADIATION DOSE REDUCTION: This exam was performed according to the departmental dose-optimization program which includes automated exposure control, adjustment of the mA and/or kV according to  patient size and/or use of iterative reconstruction technique. CONTRAST:  1m OMNIPAQUE IOHEXOL 350 MG/ML SOLN COMPARISON:  08/08/2022 FINDINGS: Lower chest: Lung bases are clear. Possible small hiatal hernia unchanged. Hepatobiliary: Previous cholecystectomy. Liver and biliary tree are otherwise unremarkable. Pancreas: Normal. Spleen: Normal. Adrenals/Urinary Tract: Adrenal glands are normal. Kidneys are normal in size without hydronephrosis or nephrolithiasis. Ureters and bladder are normal. Stomach/Bowel: Suture line over the stomach compatible previous gastric bypass procedure. Surgical suture line over a small bowel loop over the left lower quadrant. Interval resolution of previously seen fluid-filled dilated small bowel loops. Appendix is normal. Colon is unremarkable. Vascular/Lymphatic: Abdominal aorta is normal caliber. Remaining vascular structures are unremarkable. No adenopathy. Reproductive: 5.4 cm calcified uterine leiomyoma over the fundus unchanged. Adnexal  regions unremarkable. Other: No free fluid or focal inflammatory change. Postsurgical change over the midline anterior abdominal wall. Musculoskeletal: No focal abnormality. IMPRESSION: 1. No acute findings in the abdomen/pelvis. 2. Interval resolution of previously seen small bowel obstruction. 3. 5.4 cm calcified uterine leiomyoma unchanged. 4. Possible small hiatal hernia unchanged. 5. Postsurgical changes over the stomach and small bowel. Electronically Signed   By: DMarin OlpM.D.   On: 09/23/2022 08:50    Procedures Procedures    Medications Ordered in ED Medications  iohexol (OMNIPAQUE) 350 MG/ML injection 100 mL (75 mLs Intravenous Contrast Given 09/23/22 0840)    ED Course/ Medical Decision Making/ A&P                          Medical Decision Making   Patient is overall HDS and well-appearing with recent emergent small bowel resection in s/o strangulated hernia now p/w abdominal pain which has resolved. Abdominal exam with\out peritoneal signs. No evidence of acute abdomen at this time.  Given patient's recent surgery, consider postop infection though no fevers chills, pancreatitis, small bowel obstruction though she has had a normal bowel movement and has no further pain, cholecystitis/cholangitis, gastritis/PUD, appendicitis, diverticulitis.  Will obtain labs including CMP/lipase and CT abdomen pelvis.    Labs demonstrate normal electrolytes, normal renal function, lipase 68, no leukocytosis or anemia. She does have a slightly elevated alk phos, AST 65, ALT 35.  These values are at her approximately baseline likely due to NSolon Imaging demonstrates unchanged uterine leiomyoma, unchanged small hiatal hernia and postsurgical changes without any evidence of SBO or other acute findings. UA demonstrates neg nitrites, small leukocytes, no bacteria, 11-20 WBC. Patient denies any urinary symptoms, f/c, so will not treat with abx at this time.   I have personally reviewed and interpreted  all labs and imaging.   11:18 AM Patient is reevaluated, she has no epigastric pain or TTP at this time. Considered pancreatitis but lipase only 68, no further pain, and imaging neg. Patient reports she has been constipated, consider possible constipation as a source of pain, or post-surgical pain. Patient is advised to continue her miralax 2 capfuls per day and stay well hydrated, high fiber diet. She has already had 1 since postop follow-up and is scheduled for another 1 on 10/08/2022 with surgery.  Encourage patient to keep this appointment and to follow-up in the meantime with her primary care physician if able.  Patient is given discharge instructions and return precautions, all questions answered to patient understanding.  Patient will be discharged in stable condition. Dispo: DC           Final Clinical Impression(s) / ED Diagnoses Final diagnoses:  Other constipation  Epigastric  pain    Rx / DC Orders ED Discharge Orders     None        This note was created using dictation software, which may contain spelling or grammatical errors.    Audley Hose, MD 09/23/22 1121

## 2022-09-23 NOTE — ED Provider Triage Note (Signed)
Emergency Medicine Provider Triage Evaluation Note  Marilouise Densmore , a 46 y.o. female  was evaluated in triage.  Pt complains of abdominal pain, nausea/vomiting and constipation.  Hx of SBO in august 2023 secondary to hernia, surgical repair 08/08/22.  Review of Systems  Positive: Abdominal pain, nausea/vomiting Negative: fever  Physical Exam  BP 97/62 (BP Location: Right Arm)   Pulse 90   Temp 99.1 F (37.3 C) (Oral)   Resp 19   Ht 5' 5.75" (1.67 m)   Wt 116.6 kg   SpO2 97%   BMI 41.80 kg/m  Gen:   Awake, no distress   Resp:  Normal effort  MSK:   Moves extremities without difficulty  Other:    Medical Decision Making  Medically screening exam initiated at 2:26 AM.  Appropriate orders placed.  Vianca Bracher was informed that the remainder of the evaluation will be completed by another provider, this initial triage assessment does not replace that evaluation, and the importance of remaining in the ED until their evaluation is complete.  Abdominal pain.  Recent SBO with surgical intervention.  Plan for labs, CT.   Larene Pickett, PA-C 09/23/22 [phone]

## 2022-09-23 NOTE — ED Notes (Signed)
Call from CT stating waiting for labs to be processed before can take for CT scan; call placed to lab who states sample was hemolyzed and needs to be recollected

## 2022-09-24 ENCOUNTER — Encounter: Payer: Self-pay | Admitting: Family Medicine

## 2022-09-24 ENCOUNTER — Ambulatory Visit (INDEPENDENT_AMBULATORY_CARE_PROVIDER_SITE_OTHER): Payer: 59 | Admitting: Family Medicine

## 2022-09-24 ENCOUNTER — Other Ambulatory Visit (HOSPITAL_COMMUNITY): Payer: Self-pay

## 2022-09-24 VITALS — BP 104/74 | HR 92 | Temp 99.9°F | Wt 261.4 lb

## 2022-09-24 DIAGNOSIS — K5900 Constipation, unspecified: Secondary | ICD-10-CM | POA: Diagnosis not present

## 2022-09-24 DIAGNOSIS — K219 Gastro-esophageal reflux disease without esophagitis: Secondary | ICD-10-CM

## 2022-09-24 DIAGNOSIS — R748 Abnormal levels of other serum enzymes: Secondary | ICD-10-CM

## 2022-09-24 DIAGNOSIS — R200 Anesthesia of skin: Secondary | ICD-10-CM | POA: Diagnosis not present

## 2022-09-24 MED ORDER — MAGIC MOUTHWASH: ORAL | 0 refills | Status: DC

## 2022-09-24 MED ORDER — NYSTATIN 100000 UNIT/ML MT SUSP
OROMUCOSAL | 0 refills | Status: DC
  Filled 2022-09-24: qty 120, 8d supply, fill #0

## 2022-09-24 MED ORDER — OMEPRAZOLE 20 MG PO CPDR
20.0000 mg | DELAYED_RELEASE_CAPSULE | Freq: Every day | ORAL | 3 refills | Status: DC
  Filled 2022-09-24: qty 30, 30d supply, fill #0
  Filled 2022-10-19: qty 30, 30d supply, fill #1
  Filled 2022-11-18: qty 30, 30d supply, fill #2
  Filled 2022-12-19: qty 30, 30d supply, fill #3

## 2022-09-24 NOTE — Progress Notes (Signed)
Subjective:    Patient ID: Krista Fisher, female    DOB: 09-02-1976, 46 y.o.   MRN: [id number]  Chief Complaint  Patient presents with   Hospitalization Follow-up    Constipation, states things are going good.     HPI Patient is a 46 year old female with pmh sig for HTN, seasonal allergies, sleep apnea, GERD, NAFLD, PCOS, osteoarthrosis, obesity s/p biliopancreatic diversion with duodenal switch, PTSD, history of MDD, anxiety, fibroids, s/p bowel resection who was seen today for ED follow-up.  Pt seen 09/23/2022 for abdominal pain thought 2/2 constipation.  Patient states she was taking MiraLAX twice daily and magnesium 400 mg daily for constipation.  She went to ED as she woke up with abdominal pain similar to when she had SBO.  Patient notes improvement in symptoms.  Patient inquires about decreasing dose of omeprazole from 40 mg to 20 mg daily.  States appetite is up and down.  Some days eats breakfast but other days may not.  Patient notes continued numbness of tongue.  States symptoms started around August 15 when she had dental work.  Patient was later hospitalized August 18 for SBO.  Pt went to her dentist recently to inquire about continued symptoms.  Simona Huh stated he was hesitant to start patient on prednisone given her other ongoing health problems.  Patient states the numbness is in the middle of the top part of her tongue.  Does not cause difficulty swallowing.  Patient inquires if orders from previously elevated labs could be redrawn.  Past Medical History:  Diagnosis Date   GERD (gastroesophageal reflux disease)    Hypertension     Allergies  Allergen Reactions   Lamotrigine Other (See Comments)    Patient had headaches, abdominal cramping   Wellbutrin Xl [Bupropion] Other (See Comments)    Abdominal pain   Latex Rash and Other (See Comments)    Burning    ROS General: Denies fever, chills, night sweats, changes in weight +changes in appetite HEENT: Denies  headaches, ear pain, changes in vision, rhinorrhea, sore throat CV: Denies CP, palpitations, SOB, orthopnea Pulm: Denies SOB, cough, wheezing GI: Denies abdominal pain, nausea, vomiting, diarrhea +constipation GU: Denies dysuria, hematuria, frequency, vaginal discharge Msk: Denies muscle cramps, joint pains Neuro: Denies weakness, numbness, tingling  +numbness of tongue Skin: Denies rashes, bruising Psych: Denies depression, anxiety, hallucinations     Objective:    Blood pressure 104/74, pulse 92, temperature 99.9 F (37.7 C), temperature source Oral, weight 261 lb 6.4 oz (118.6 kg), SpO2 96 %.  Gen. Pleasant, well-nourished, in no distress, normal affect   HEENT: /AT, face symmetric, conjunctiva clear, no scleral icterus, PERRLA, EOMI, nares patent without drainage, tongue midline with scant white film visible, pharynx without erythema or exudate. Lungs: no accessory muscle use, CTAB, no wheezes or rales Cardiovascular: RRR, no m/r/g, no peripheral edema Abdomen: BS present, soft, NT/ND, no hepatosplenomegaly. Musculoskeletal: No deformities, no cyanosis or clubbing, normal tone Neuro:  A&Ox3, CN II-XII intact, normal gait Skin:  Warm, no lesions/ rash.  Well-healed surgical incisions midline abdomen and lateral abdomen.   Wt Readings from Last 3 Encounters:  09/24/22 261 lb 6.4 oz (118.6 kg)  09/23/22 257 lb (116.6 kg)  09/12/22 257 lb (116.6 kg)    Lab Results  Component Value Date   WBC 7.7 09/23/2022   HGB 12.9 09/23/2022   HCT 37.2 09/23/2022   PLT 160 09/23/2022   GLUCOSE 113 (H) 09/23/2022   CHOL 145 10/31/2009   TRIG 70 08/14/2022  HDL 37 (L) 10/31/2009   LDLCALC 91 10/31/2009   ALT 35 09/23/2022   AST 65 (H) 09/23/2022   NA 140 09/23/2022   K 4.9 09/23/2022   CL 105 09/23/2022   CREATININE 0.89 09/23/2022   BUN 12 09/23/2022   CO2 25 09/23/2022   TSH 2.80 09/12/2022   INR 1.0 09/06/2020   HGBA1C 5.0 08/08/2022    Assessment/Plan:  Numbness of  tongue -Possibly 2/2 nerve compression at time of dental procedure August 05, 2022 -Also consider vitamin deficiency -We will start Magic mouthwash with diphenhydramine, dexamethasone, nystatin to see if symptoms improve - Plan: magic mouthwash SOLN  Gastroesophageal reflux disease without esophagitis -We will decrease omeprazole from 40 to 20 mg daily upon patient request -Continue lifestyle modifications - Plan: omeprazole (PRILOSEC) 20 MG capsule -Continue follow-up GI  Constipation, unspecified constipation type -Improving/resolved -Continue OTC medications as needed -Continue diet modifications -CT abdomen and pelvis with contrast 110/3/23 reviewed and normal.  No acute findings in the abdomen/pelvis, interval resolution of previously seen small bowel obstruction, 5.4 cm calcified uterine leiomyoma unchanged, possible small hiatal hernia unchanged, postsurgical changes of the stomach and small bowel.  Elevated alkaline phosphatase level -Possibly 2/2 recent constipation -Status post cholecystectomy - Plan: CMP, Gamma GT  F/u as needed for continued or worsening symptoms.  Grier Mitts, MD

## 2022-09-25 LAB — COMPREHENSIVE METABOLIC PANEL
ALT: 26 U/L (ref 0–35)
AST: 40 U/L — ABNORMAL HIGH (ref 0–37)
Albumin: 4 g/dL (ref 3.5–5.2)
Alkaline Phosphatase: 146 U/L — ABNORMAL HIGH (ref 39–117)
BUN: 10 mg/dL (ref 6–23)
CO2: 30 mEq/L (ref 19–32)
Calcium: 9.4 mg/dL (ref 8.4–10.5)
Chloride: 103 mEq/L (ref 96–112)
Creatinine, Ser: 0.95 mg/dL (ref 0.40–1.20)
GFR: 71.7 mL/min (ref >60.00)
Glucose, Bld: 80 mg/dL (ref 70–99)
Potassium: 4.3 mEq/L (ref 3.5–5.1)
Sodium: 138 mEq/L (ref 135–145)
Total Bilirubin: 0.4 mg/dL (ref 0.2–1.2)
Total Protein: 7.5 g/dL (ref 6.0–8.3)

## 2022-09-25 LAB — GAMMA GT: GGT: 25 U/L (ref 7–51)

## 2022-09-29 ENCOUNTER — Encounter: Payer: Self-pay | Admitting: Family Medicine

## 2022-09-29 NOTE — Progress Notes (Incomplete)
Subjective:    Patient ID: Krista Fisher, female    DOB: 10/05/1976, 46 y.o.   MRN: [id number]  Chief Complaint  Patient presents with  . Irregular Heart Beat    Wed night started had 2 irregular beats and after the 2nd, it was taking longer to regulate.     HPI Patient was seen today for f/u on acute concern.  Pt endorses palpitations x 5 min 2 nights ago.  Happened in the past, but lasting longer.   Past Medical History:  Diagnosis Date  . GERD (gastroesophageal reflux disease)   . Hypertension     Allergies  Allergen Reactions  . Lamotrigine Other (See Comments)    Patient had headaches, abdominal cramping  . Wellbutrin Xl [Bupropion] Other (See Comments)    Abdominal pain  . Latex Rash and Other (See Comments)    Burning    ROS General: Denies fever, chills, night sweats, changes in weight, changes in appetite HEENT: Denies headaches, ear pain, changes in vision, rhinorrhea, sore throat CV: Denies CP, palpitations, SOB, orthopnea Pulm: Denies SOB, cough, wheezing GI: Denies abdominal pain, nausea, vomiting, diarrhea, constipation GU: Denies dysuria, hematuria, frequency, vaginal discharge Msk: Denies muscle cramps, joint pains Neuro: Denies weakness, numbness, tingling Skin: Denies rashes, bruising Psych: Denies depression, anxiety, hallucinations      Objective:    Blood pressure 92/68, pulse 97, temperature 98.7 F (37.1 C), temperature source Oral, weight 257 lb (116.6 kg), SpO2 97 %.   Gen. Pleasant, well-nourished, in no distress, normal affect  *** HEENT: [place]/AT, face symmetric, conjunctiva clear, no scleral icterus, PERRLA, EOMI, nares patent without drainage, pharynx without erythema or exudate. Neck: No JVD, no thyromegaly, no carotid bruits Lungs: no accessory muscle use, CTAB, no wheezes or rales Cardiovascular: RRR, no m/r/g, no ***peripheral edema Abdomen: BS present, soft, NT/ND, no hepatosplenomegaly. Musculoskeletal: No deformities, no  cyanosis or clubbing, normal tone Neuro:  A&Ox3, CN II-XII intact, normal gait Skin:  Warm, no lesions/ rash   Wt Readings from Last 3 Encounters:  09/12/22 257 lb (116.6 kg)  08/20/22 263 lb 6.4 oz (119.5 kg)  08/19/22 263 lb (119.3 kg)    Lab Results  Component Value Date   WBC 12.7 (H) 08/13/2022   HGB 10.4 (L) 08/13/2022   HCT 29.5 (L) 08/13/2022   PLT 227 08/13/2022   GLUCOSE 107 (H) 08/14/2022   CHOL 145 10/31/2009   TRIG 70 08/14/2022   HDL 37 (L) 10/31/2009   LDLCALC 91 10/31/2009   ALT 24 08/14/2022   AST 33 08/14/2022   NA 138 08/14/2022   K 4.2 08/14/2022   CL 106 08/14/2022   CREATININE 0.76 08/14/2022   BUN 8 08/14/2022   CO2 26 08/14/2022   TSH 2.59 03/13/2022   INR 1.0 09/06/2020   HGBA1C 5.0 08/08/2022    Assessment/Plan:  Palpitations   - Plan: TSH, T4, Free, Vitamin B12, CBC with Differential/Platelet, CMP  GAD (generalized anxiety disorder) - Plan: TSH, T4, Free  F/u ***  Grier Mitts, MD

## 2022-09-30 NOTE — Telephone Encounter (Signed)
Pt checking on progress of this refill. Was made aware of provider being out of office, returning 10/01/22

## 2022-10-02 NOTE — Telephone Encounter (Signed)
Pt is calling checking on the below message

## 2022-10-03 ENCOUNTER — Other Ambulatory Visit: Payer: Self-pay | Admitting: Family Medicine

## 2022-10-03 ENCOUNTER — Encounter: Payer: Self-pay | Admitting: Family Medicine

## 2022-10-07 ENCOUNTER — Ambulatory Visit
Admission: RE | Admit: 2022-10-07 | Discharge: 2022-10-07 | Disposition: A | Discharge status: Home / Self Care | Payer: 59 | Source: Ambulatory Visit | Attending: Emergency Medicine | Admitting: Emergency Medicine

## 2022-10-07 ENCOUNTER — Other Ambulatory Visit (HOSPITAL_COMMUNITY): Payer: Self-pay

## 2022-10-07 VITALS — BP 106/72 | HR 85 | Temp 98.4°F | Resp 18

## 2022-10-07 DIAGNOSIS — N760 Acute vaginitis: Secondary | ICD-10-CM | POA: Diagnosis not present

## 2022-10-07 DIAGNOSIS — Z1231 Encounter for screening mammogram for malignant neoplasm of breast: Secondary | ICD-10-CM | POA: Diagnosis not present

## 2022-10-07 LAB — POCT URINALYSIS DIP (MANUAL ENTRY)
Bilirubin, UA: NEGATIVE
Blood, UA: NEGATIVE
Glucose, UA: NEGATIVE mg/dL
Ketones, POC UA: NEGATIVE mg/dL
Leukocytes, UA: NEGATIVE
Nitrite, UA: NEGATIVE
Protein Ur, POC: NEGATIVE mg/dL
Spec Grav, UA: 1.025 (ref 1.010–1.025)
Urobilinogen, UA: 1 E.U./dL
pH, UA: 5.5 (ref 5.0–8.0)

## 2022-10-07 MED ORDER — TERCONAZOLE 0.4 % VA CREA
TOPICAL_CREAM | VAGINAL | 0 refills | Status: DC
  Filled 2022-10-07: qty 45, 7d supply, fill #0

## 2022-10-07 NOTE — ED Provider Notes (Signed)
[facility]    CSN: [id number] Arrival date & time: 10/07/22  9629    HISTORY   Chief Complaint  Patient presents with   Dysuria    Also f/u for numb tongue - Entered by patient   HPI Krista Fisher is a pleasant, 46 y.o. female who presents to urgent care today. Pt c/o vaginal itching, and burning with urination that began about 2 days ago.   Home interventions: none   The history is provided by the patient.   Past Medical History:  Diagnosis Date   GERD (gastroesophageal reflux disease)    Hypertension    Patient Active Problem List   Diagnosis Date Noted   Acute bilateral low back pain without sciatica 08/19/2022   Anxiety 08/19/2022   S/P hernia surgery 08/19/2022   S/P small bowel resection 08/19/2022   Hospital discharge follow-up 08/19/2022   Abdominal pain 08/08/2022   Fibroid 09/06/2020   Fibroids 09/06/2020   Diarrhea due to malabsorption 07/02/2020   Anal fissure 04/05/2020   Hemorrhoids 04/05/2020   Dysmenorrhea 02/07/2020   Polycystic ovary syndrome 02/07/2020   Status post biliopancreatic diversion with duodenal switch 01/17/2020   Excessive daytime sleepiness 01/17/2020   Hypersomnia with sleep apnea 01/17/2020   Loud snoring 01/17/2020   Morbid obesity with body mass index of 50 or higher ([place]) 01/17/2020   Arm numbness 03/08/2018   Osteoarthrosis involving lower leg 05/06/2011   EDEMA 02/04/2011   CHEST PAIN-UNSPECIFIED 02/04/2011   VITAMIN D DEFICIENCY 12/03/2009   EAR PAIN, BILATERAL 12/03/2009   ALLERGIC RHINITIS 12/03/2009   Morbid obesity ([place]) 10/31/2009   DEPRESSION 10/31/2009   HYPERTENSION, BENIGN ESSENTIAL 10/31/2009   BLOCK, OTHER HEART 10/31/2009   URI 10/31/2009   GERD 10/31/2009   SLEEP APNEA 10/31/2009   PALPITATIONS 10/31/2009   1st degree AV block 10/31/2009   Major depressive disorder, single episode, unspecified 10/31/2009   Fatty liver disease, nonalcoholic 52/84/1324   Chronic post-traumatic stress  disorder (PTSD) 04/05/1994   Past Surgical History:  Procedure Laterality Date   CESAREAN SECTION     CHOLECYSTECTOMY     GASTRECTOMY     GASTRIC BYPASS     IR ANGIOGRAM PELVIS SELECTIVE OR SUPRASELECTIVE  09/06/2020   IR ANGIOGRAM SELECTIVE EACH ADDITIONAL VESSEL  09/06/2020   IR ANGIOGRAM SELECTIVE EACH ADDITIONAL VESSEL  09/06/2020   IR EMBO TUMOR ORGAN ISCHEMIA INFARCT INC GUIDE ROADMAPPING  09/06/2020   IR RADIOLOGIST EVAL & MGMT  07/11/2020   IR RADIOLOGIST EVAL & MGMT  09/19/2020   IR RADIOLOGIST EVAL & MGMT  05/08/2021   IR US GUIDE VASC ACCESS LEFT  09/06/2020   KNEE SURGERY     LAPAROSCOPY N/A 08/08/2022   Procedure: LAPAROSCOPY DIAGNOSTIC;  Surgeon: Felicie Morn, MD;  Location: [place];  Service: General;  Laterality: N/A;   LAPAROTOMY N/A 08/08/2022   Procedure: EXPLORATORY LAPAROTOMY;  Surgeon: Felicie Morn, MD;  Location: [facility];  Service: General;  Laterality: N/A;   WRIST FRACTURE SURGERY     OB History   No obstetric history on file.    Home Medications    Prior to Admission medications   Medication Sig Start Date End Date Taking? Authorizing Provider  terconazole (TERAZOL 7) 0.4 % vaginal cream Apply twice daily to vulvovaginal area, can reapply after every void, use for 7 days as needed 10/07/22  Yes Lynden Oxford Scales, PA-C  acetaminophen (TYLENOL) 500 MG tablet Take 500 mg by mouth every 6 (six) hours as needed.  [provider]  ALPRAZolam Duanne Moron) 0.5 MG tablet Take 1 tablet by mouth daily as needed for severe anxiety/panic attack. 08/06/22   Arfeen, Arlyce Harman, MD  Calcium Carb-Cholecalciferol 600-10 MG-MCG TABS Take 1 tablet by mouth daily. 01/25/20   [provider]  cetirizine (ZYRTEC) 10 MG tablet Take 10 mg by mouth daily.    [provider]  COLLAGEN PO Take 1 tablet by mouth daily.     [provider]  escitalopram (LEXAPRO) 20 MG tablet Take 1 tablet (20 mg total) by mouth daily. 08/06/22   Arfeen, Arlyce Harman, MD   etonogestrel (NEXPLANON) 68 MG IMPL implant 1 each by Subdermal route once. 06/06/20   [provider]  ferrous sulfate 325 (65 FE) MG tablet Take 325 mg by mouth daily with breakfast.    [provider]  fluticasone (FLONASE) 50 MCG/ACT nasal spray Place 1 spray into both nostrils daily.    [provider]  Ginger, Zingiber officinalis, (GINGER PO) Take 1 tablet by mouth daily.    [provider]  hydrOXYzine (ATARAX) 50 MG tablet take 1 tablet (50 mg) by mouth up to 3 times per day as needed 10/07/21     magic mouthwash SOLN Swish and spit 5 mL 3 times daily for 5 days. 09/24/22   Billie Ruddy, MD  magnesium 30 MG tablet Take 200 mg by mouth 2 (two) times daily.    [provider]  methocarbamol (ROBAXIN) 500 MG tablet Take 1 tablet by mouth every 8 hours as needed for muscle spasms. 08/14/22   Meuth, Brooke A, PA-C  metoprolol tartrate (LOPRESSOR) 25 MG tablet Take 1 tablet by mouth 2 times daily. Patient taking differently: Take 25 mg by mouth daily. 08/14/22   Meuth, Blaine Hamper, PA-C  Multiple Vitamins-Iron (MULTIVITAMINS WITH IRON) TABS tablet Take 1 tablet by mouth daily.    [provider]  nystatin-dexamethasone-diphenhydrAMINE Swish and spit 5 ml by mouth 3 times a day for 5 days 09/24/22   Billie Ruddy, MD  omeprazole (PRILOSEC) 20 MG capsule Take 1 capsule (20 mg total) by mouth daily. 09/24/22   Billie Ruddy, MD  ondansetron (ZOFRAN-ODT) 4 MG disintegrating tablet Dissolve 1 tablet by mouth every 6 hours as needed for nausea. 08/14/22   Meuth, Brooke A, PA-C  oxyCODONE (OXY IR/ROXICODONE) 5 MG immediate release tablet Take 1 tablet by mouth every 6 hours as needed for severe pain. 08/21/22   Meuth, Blaine Hamper, PA-C  Turmeric (QC TUMERIC COMPLEX PO) Take 1 tablet by mouth daily.    [provider]  Vitamin A (A-25 PO) Take 1 tablet by mouth daily.    [provider]  zinc gluconate 50 MG tablet Take 50 mg by mouth  daily.    [provider]    Family History Family History  Problem Relation Age of Onset   Learning disabilities Father    Hypertension Father    Hyperlipidemia Father    Heart disease Father    Heart failure Father    Diabetes Father    Learning disabilities Son    Depression Son    Depression Paternal Uncle    Drug abuse Paternal Uncle    Early death Paternal Uncle    Diabetes Maternal Grandmother    Intellectual disability Maternal Grandmother    Hypertension Maternal Grandmother    Hyperlipidemia Maternal Grandmother    Heart disease Maternal Grandmother    Diabetes Paternal Grandmother    Colon cancer Neg  Hx    Stomach cancer Neg Hx    Esophageal cancer Neg Hx    Pancreatic cancer Neg Hx    Social History Social History   Tobacco Use   Smoking status: Never   Smokeless tobacco: Never  Vaping Use   Vaping Use: Never used  Substance Use Topics   Alcohol use: No   Drug use: No   Allergies   Lamotrigine, Wellbutrin xl [bupropion], and Latex  Review of Systems Review of Systems Pertinent findings revealed after performing a 14 point review of systems has been noted in the history of present illness.  Physical Exam Triage Vital Signs ED Triage Vitals  Enc Vitals Group     BP 10/18/21 0827 (!) 147/82     Pulse Rate 10/18/21 0827 72     Resp 10/18/21 0827 18     Temp 10/18/21 0827 98.3 F (36.8 C)     Temp Source 10/18/21 0827 Oral     SpO2 10/18/21 0827 98 %     Weight --      Height --      Head Circumference --      Peak Flow --      Pain Score 10/18/21 0826 5     Pain Loc --      Pain Edu? --      Excl. in [place]? --   No data found.  Updated Vital Signs BP 106/72 (BP Location: Left Arm)   Pulse 85   Temp 98.4 F (36.9 C) (Oral)   Resp 18   SpO2 95%   Physical Exam  Visual Acuity Right Eye Distance:   Left Eye Distance:   Bilateral Distance:    Right Eye Near:   Left Eye Near:    Bilateral Near:     UC Couse / Diagnostics /  Procedures:     Radiology No results found.  Procedures Procedures (including critical care time) EKG  Pending results:  Labs Reviewed  POCT URINALYSIS DIP (MANUAL ENTRY)  CERVICOVAGINAL ANCILLARY ONLY    Medications Ordered in UC: Medications - No data to display  UC Diagnoses / Final Clinical Impressions(s)   I have reviewed the triage vital signs and the nursing notes.  Pertinent labs & imaging results that were available during my care of the patient were reviewed by me and considered in my medical decision making (see chart for details).    Final diagnoses:  Vaginitis and vulvovaginitis    {[id number]}  Given history of elevated blood sugar levels, recommend patient receive a 2 dose series of Diflucan if vaginal swab reveals Candida.  ED Prescriptions     Medication Sig Dispense Auth. Provider   terconazole (TERAZOL 7) 0.4 % vaginal cream Apply twice daily to vulvovaginal area, can reapply after every void, use for 7 days as needed 45 g Lynden Oxford Scales, PA-C      PDMP not reviewed this encounter.  Disposition Upon Discharge:  Condition: stable for discharge home  Patient presented with concern for an acute illness with associated systemic symptoms and significant discomfort requiring urgent management. In my opinion, this is a condition that a prudent lay person (someone who possesses an average knowledge of health and medicine) may potentially expect to result in complications if not addressed urgently such as respiratory distress, impairment of bodily function or dysfunction of bodily organs.   As such, the patient has been evaluated and assessed, work-up was performed and treatment was provided in alignment with urgent care protocols and  evidence based medicine.  Patient/parent/caregiver has been advised that the patient may require follow up for further testing and/or treatment if the symptoms continue in spite of treatment, as clinically indicated and  appropriate.  Routine symptom specific, illness specific and/or disease specific instructions were discussed with the patient and/or caregiver at length.  Prevention strategies for avoiding STD exposure were also discussed.  The patient will follow up with their current PCP if and as advised. If the patient does not currently have a PCP we will assist them in obtaining one.   The patient may need specialty follow up if the symptoms continue, in spite of conservative treatment and management, for further workup, evaluation, consultation and treatment as clinically indicated and appropriate.  Patient/parent/caregiver verbalized understanding and agreement of plan as discussed.  All questions were addressed during visit.  Please see discharge instructions below for further details of plan.  Discharge Instructions:   Discharge Instructions      For comfort, while you are waiting for the result of your vaginal swab test, I have provided you with an external yeast infection cream that you can apply 3-4 times daily for relief of vaginal itching and burning.  I recommend that you abstain from sexual intercourse, tampon use or any other other intravaginal activities while you are waiting on these results and possible treatment.   The results of your vaginal swab test which tests for BV, yeast, gonorrhea, chlamydia and trichomonas will be posted to your MyChart account in the next 3 to 5 days.  If any of your results are abnormal, you will receive a phone call regarding further treatment.  Additional prescriptions, if any are needed, will be provided for you at your pharmacy.   Please abstain from sexual intercourse of any kind, vaginal, oral or anal, until until you have received the results of your test.  If your test is positive for yeast, recommend that you take a 2 dose series of Diflucan, the nurse that reaches out to you will see this in my notes.   If you have not had complete resolution of your  symptoms after completing any needed treatment, please return for repeat evaluation.   Thank you for visiting urgent care today.  I appreciate the opportunity to participate in your care.     This office note has been dictated using Museum/gallery curator.  Unfortunately, this method of dictation can sometimes lead to typographical or grammatical errors.  I apologize for your inconvenience in advance if this occurs.  Please do not hesitate to reach out to me if clarification is needed.

## 2022-10-07 NOTE — ED Triage Notes (Signed)
Pt c/o vaginal itching, and burning with urination that began about 2 days ago.  Home interventions: none

## 2022-10-07 NOTE — Discharge Instructions (Addendum)
For comfort, while you are waiting for the result of your vaginal swab test, I have provided you with an external yeast infection cream that you can apply 3-4 times daily for relief of vaginal itching and burning.  I recommend that you abstain from sexual intercourse, tampon use or any other other intravaginal activities while you are waiting on these results and possible treatment.   The results of your vaginal swab test which tests for BV, yeast, gonorrhea, chlamydia and trichomonas will be posted to your MyChart account in the next 3 to 5 days.  If any of your results are abnormal, you will receive a phone call regarding further treatment.  Additional prescriptions, if any are needed, will be provided for you at your pharmacy.   Please abstain from sexual intercourse of any kind, vaginal, oral or anal, until until you have received the results of your test.  If your test is positive for yeast, recommend that you take a 2 dose series of Diflucan, the nurse that reaches out to you will see this in my notes.   If you have not had complete resolution of your symptoms after completing any needed treatment, please return for repeat evaluation.   Thank you for visiting urgent care today.  I appreciate the opportunity to participate in your care.

## 2022-10-08 ENCOUNTER — Encounter: Payer: Self-pay | Admitting: Family Medicine

## 2022-10-08 ENCOUNTER — Ambulatory Visit: Payer: 59 | Admitting: Family Medicine

## 2022-10-08 ENCOUNTER — Other Ambulatory Visit (HOSPITAL_COMMUNITY): Payer: Self-pay

## 2022-10-08 VITALS — BP 108/66 | HR 79 | Temp 99.5°F | Wt 264.2 lb

## 2022-10-08 DIAGNOSIS — R300 Dysuria: Secondary | ICD-10-CM | POA: Diagnosis not present

## 2022-10-08 DIAGNOSIS — N760 Acute vaginitis: Secondary | ICD-10-CM

## 2022-10-08 DIAGNOSIS — R200 Anesthesia of skin: Secondary | ICD-10-CM | POA: Diagnosis not present

## 2022-10-08 LAB — CERVICOVAGINAL ANCILLARY ONLY
Bacterial Vaginitis (gardnerella): NEGATIVE
Candida Glabrata: NEGATIVE
Candida Vaginitis: NEGATIVE
Chlamydia: NEGATIVE
Comment: NEGATIVE
Comment: NEGATIVE
Comment: NEGATIVE
Comment: NEGATIVE
Comment: NEGATIVE
Comment: NORMAL
Neisseria Gonorrhea: NEGATIVE
Trichomonas: NEGATIVE

## 2022-10-08 LAB — POCT URINALYSIS DIPSTICK
Bilirubin, UA: NEGATIVE
Blood, UA: NEGATIVE
Glucose, UA: NEGATIVE
Ketones, UA: NEGATIVE
Leukocytes, UA: NEGATIVE
Nitrite, UA: NEGATIVE
Protein, UA: NEGATIVE
Spec Grav, UA: 1.025 (ref 1.010–1.025)
Urobilinogen, UA: NEGATIVE E.U./dL — AB
pH, UA: 6 (ref 5.0–8.0)

## 2022-10-08 MED ORDER — FLUCONAZOLE 150 MG PO TABS
150.0000 mg | ORAL_TABLET | Freq: Once | ORAL | 0 refills | Status: AC
  Filled 2022-10-08: qty 1, 1d supply, fill #0

## 2022-10-08 NOTE — Progress Notes (Signed)
Subjective:    Patient ID: Krista Fisher, female    DOB: May 04, 1976, 46 y.o.   MRN: [id number]  Chief Complaint  Patient presents with   Follow-up    Tongue numbness.    Dysuria    Been 3 days, burning, lots of itching, but is all over. Tried some cream yesterday. Not helping    HPI Patient was seen today for acute concern.  Pt seen at [facility] yesterday 10/07/22 for dysuria.  UA negative.  Symptoms thought 2/2 vaginitis, given terconazole cream.  Vaginal swab results pending.  Pt denies changes in soaps,lotions, detergents, vaginal discharge, recent intercourse.  Patient exercising then running errands in the same clothes.  Pt also seen for f/u on tongue numbness.  Symptoms started after dental procedure.  Given magic mouth wash which helped some.  Pt sent a request for a refill on rinse prior to going out of town, but this provider was not in office.  Patient notes some improvement in the numbness.  Taste is still a little off.  Past Medical History:  Diagnosis Date   GERD (gastroesophageal reflux disease)    Hypertension     Allergies  Allergen Reactions   Lamotrigine Other (See Comments)    Patient had headaches, abdominal cramping   Wellbutrin Xl [Bupropion] Other (See Comments)    Abdominal pain   Latex Rash and Other (See Comments)    Burning    ROS General: Denies fever, chills, night sweats, changes in weight, changes in appetite HEENT: Denies headaches, ear pain, changes in vision, rhinorrhea, sore throat + numbness of tongue CV: Denies CP, palpitations, SOB, orthopnea Pulm: Denies SOB, cough, wheezing GI: Denies abdominal pain, nausea, vomiting, diarrhea, constipation GU: Denies dysuria, hematuria, frequency, vaginal discharge  +vaginal irritation Msk: Denies muscle cramps, joint pains Neuro: Denies weakness, numbness, tingling Skin: Denies rashes, bruising Psych: Denies depression, anxiety, hallucinations     Objective:    Blood pressure 108/66, pulse 79,  temperature 99.5 F (37.5 C), temperature source Oral, weight 264 lb 3.2 oz (119.8 kg), SpO2 99 %.  Gen. Pleasant, well-nourished, in no distress, normal affect   HEENT: [place]/AT, face symmetric, conjunctiva clear, no scleral icterus, PERRLA, EOMI, nares patent without drainage Lungs: no accessory muscle use Cardiovascular: RRR, no peripheral edema Neuro:  A&Ox3, CN II-XII intact, normal gait Skin:  Warm, no lesions/ rash   Wt Readings from Last 3 Encounters:  09/24/22 261 lb 6.4 oz (118.6 kg)  09/23/22 257 lb (116.6 kg)  09/12/22 257 lb (116.6 kg)    Lab Results  Component Value Date   WBC 7.7 09/23/2022   HGB 12.9 09/23/2022   HCT 37.2 09/23/2022   PLT 160 09/23/2022   GLUCOSE 80 09/24/2022   CHOL 145 10/31/2009   TRIG 70 08/14/2022   HDL 37 (L) 10/31/2009   LDLCALC 91 10/31/2009   ALT 26 09/24/2022   AST 40 (H) 09/24/2022   NA 138 09/24/2022   K 4.3 09/24/2022   CL 103 09/24/2022   CREATININE 0.95 09/24/2022   BUN 10 09/24/2022   CO2 30 09/24/2022   TSH 2.80 09/12/2022   INR 1.0 09/06/2020   HGBA1C 5.0 08/08/2022    Assessment/Plan:  Dysuria  -new problem -UA negative other than elevated SG 1.025 -increase po intake of water - Plan: POCT urinalysis dipstick  Numbness of tongue -occurred after dental procedure -improving -continue to monitor  Acute vaginitis  -new problem -pt advised to wait on results of vaginal swab obtained yesterday at [facility] -given  handout - Plan: fluconazole (DIFLUCAN) 150 MG tablet  F/u prn  Grier Mitts, MD

## 2022-10-08 NOTE — Telephone Encounter (Signed)
Pt was seen in office.

## 2022-10-13 ENCOUNTER — Other Ambulatory Visit (HOSPITAL_COMMUNITY): Payer: Self-pay

## 2022-10-13 MED ORDER — METHYLPREDNISOLONE 4 MG PO TBPK
ORAL_TABLET | ORAL | 0 refills | Status: DC
  Filled 2022-10-13: qty 21, 6d supply, fill #0

## 2022-10-15 ENCOUNTER — Ambulatory Visit: Payer: 59 | Admitting: Skilled Nursing Facility1

## 2022-10-17 ENCOUNTER — Other Ambulatory Visit (HOSPITAL_COMMUNITY): Payer: Self-pay

## 2022-10-19 ENCOUNTER — Other Ambulatory Visit (HOSPITAL_COMMUNITY): Payer: Self-pay | Admitting: Psychiatry

## 2022-10-19 DIAGNOSIS — F410 Panic disorder [episodic paroxysmal anxiety] without agoraphobia: Secondary | ICD-10-CM

## 2022-10-20 ENCOUNTER — Other Ambulatory Visit (HOSPITAL_COMMUNITY): Payer: Self-pay

## 2022-10-23 ENCOUNTER — Telehealth: Payer: 59 | Admitting: Physician Assistant

## 2022-10-23 ENCOUNTER — Other Ambulatory Visit (HOSPITAL_COMMUNITY): Payer: Self-pay

## 2022-10-23 DIAGNOSIS — B3731 Acute candidiasis of vulva and vagina: Secondary | ICD-10-CM | POA: Diagnosis not present

## 2022-10-23 MED ORDER — FLUCONAZOLE 150 MG PO TABS
150.0000 mg | ORAL_TABLET | ORAL | 0 refills | Status: AC
  Filled 2022-10-23: qty 3, 9d supply, fill #0

## 2022-10-23 NOTE — Progress Notes (Signed)

## 2022-10-23 NOTE — Progress Notes (Signed)
I have spent 5 minutes in review of e-visit questionnaire, review and updating patient chart, medical decision making and response to patient.   Kensleigh Gates Cody Shemeika Starzyk, PA-C    

## 2022-10-24 DIAGNOSIS — Z760 Encounter for issue of repeat prescription: Secondary | ICD-10-CM | POA: Diagnosis not present

## 2022-11-04 ENCOUNTER — Ambulatory Visit (INDEPENDENT_AMBULATORY_CARE_PROVIDER_SITE_OTHER): Payer: 59 | Admitting: Family Medicine

## 2022-11-04 ENCOUNTER — Other Ambulatory Visit (HOSPITAL_COMMUNITY): Payer: Self-pay

## 2022-11-04 VITALS — BP 116/78 | HR 76 | Temp 97.8°F | Ht 65.75 in

## 2022-11-04 DIAGNOSIS — L299 Pruritus, unspecified: Secondary | ICD-10-CM

## 2022-11-04 DIAGNOSIS — R7989 Other specified abnormal findings of blood chemistry: Secondary | ICD-10-CM | POA: Diagnosis not present

## 2022-11-04 MED ORDER — HYDROXYZINE PAMOATE 25 MG PO CAPS
25.0000 mg | ORAL_CAPSULE | Freq: Three times a day (TID) | ORAL | 0 refills | Status: DC | PRN
  Filled 2022-11-04 – 2022-11-14 (×2): qty 30, 10d supply, fill #0

## 2022-11-04 NOTE — Progress Notes (Signed)
Subjective:     Patient ID: Krista Fisher, female    DOB: 06-29-1976, 46 y.o.   MRN: [id number]  Chief Complaint  Patient presents with   Pruritis    Itching all over for about 10 days, started in abdomen and moved to other parts of body, denies rash    HPI Patient is in today for a 10 day hx of itching on her abdomen, arms and upper thighs.  Denies having a rash.  No known triggers.  Denies changes to medications, soaps, lotions, detergents, etc.  She just returned from a cruise but was itching prior to leaving.   Took a course of steroids and completed these about a week ago for tongue numbness. Her dentist prescribed these.   No fever, chills, night sweats, fatigue, dizziness, URI symptoms, cough, chest pain, palpitations, shortness of breath, abdominal pain, nausea, vomiting or diarrhea. No myalgias or arthralgias.   Health Maintenance Due  Topic Date Due   PAP SMEAR-Modifier  02/04/2015    Past Medical History:  Diagnosis Date   GERD (gastroesophageal reflux disease)    Hypertension     Past Surgical History:  Procedure Laterality Date   CESAREAN SECTION     CHOLECYSTECTOMY     GASTRECTOMY     GASTRIC BYPASS     IR ANGIOGRAM PELVIS SELECTIVE OR SUPRASELECTIVE  09/06/2020   IR ANGIOGRAM SELECTIVE EACH ADDITIONAL VESSEL  09/06/2020   IR ANGIOGRAM SELECTIVE EACH ADDITIONAL VESSEL  09/06/2020   IR EMBO TUMOR ORGAN ISCHEMIA INFARCT INC GUIDE ROADMAPPING  09/06/2020   IR RADIOLOGIST EVAL & MGMT  07/11/2020   IR RADIOLOGIST EVAL & MGMT  09/19/2020   IR RADIOLOGIST EVAL & MGMT  05/08/2021   IR US GUIDE VASC ACCESS LEFT  09/06/2020   KNEE SURGERY     LAPAROSCOPY N/A 08/08/2022   Procedure: LAPAROSCOPY DIAGNOSTIC;  Surgeon: Felicie Morn, MD;  Location: [facility];  Service: General;  Laterality: N/A;   LAPAROTOMY N/A 08/08/2022   Procedure: EXPLORATORY LAPAROTOMY;  Surgeon: Felicie Morn, MD;  Location: [facility];  Service: General;  Laterality: N/A;   WRIST FRACTURE  SURGERY      Family History  Problem Relation Age of Onset   Learning disabilities Father    Hypertension Father    Hyperlipidemia Father    Heart disease Father    Heart failure Father    Diabetes Father    Learning disabilities Son    Depression Son    Depression Paternal Uncle    Drug abuse Paternal Uncle    Early death Paternal Uncle    Diabetes Maternal Grandmother    Intellectual disability Maternal Grandmother    Hypertension Maternal Grandmother    Hyperlipidemia Maternal Grandmother    Heart disease Maternal Grandmother    Diabetes Paternal Grandmother    Colon cancer Neg Hx    Stomach cancer Neg Hx    Esophageal cancer Neg Hx    Pancreatic cancer Neg Hx     Social History   Socioeconomic History   Marital status: Married    Spouse name: Not on file   Number of children: Not on file   Years of education: Not on file   Highest education level: Bachelor's degree (e.g., BA, AB, BS)  Occupational History   Not on file  Tobacco Use   Smoking status: Never   Smokeless tobacco: Never  Vaping Use   Vaping Use: Never used  Substance and Sexual Activity   Alcohol use: No  Drug use: No   Sexual activity: Not on file  Other Topics Concern   Not on file  Social History Narrative   Not on file   Social Determinants of Health   Financial Resource Strain: Low Risk  (09/12/2022)   Overall Financial Resource Strain (CARDIA)    Difficulty of Paying Living Expenses: Not hard at all  Food Insecurity: No Food Insecurity (09/12/2022)   Hunger Vital Sign    Worried About Running Out of Food in the Last Year: Never true    Ran Out of Food in the Last Year: Never true  Transportation Needs: No Transportation Needs (09/12/2022)   PRAPARE - Hydrologist (Medical): No    Lack of Transportation (Non-Medical): No  Physical Activity: Insufficiently Active (09/12/2022)   Exercise Vital Sign    Days of Exercise per Week: 4 days    Minutes of  Exercise per Session: 30 min  Stress: Stress Concern Present (09/12/2022)   [place]    Feeling of Stress : Very much  Social Connections: Moderately Integrated (09/12/2022)   Social Connection and Isolation Panel [NHANES]    Frequency of Communication with Friends and Family: More than three times a week    Frequency of Social Gatherings with Friends and Family: Once a week    Attends Religious Services: More than 4 times per year    Active Member of [employer] or Organizations: Yes    Attends Music therapist: More than 4 times per year    Marital Status: Separated  Intimate Partner Violence: Not on file    Outpatient Medications Prior to Visit  Medication Sig Dispense Refill   acetaminophen (TYLENOL) 500 MG tablet Take 500 mg by mouth every 6 (six) hours as needed.     ALPRAZolam (XANAX) 0.5 MG tablet Take 1 tablet by mouth daily as needed for severe anxiety/panic attack. 20 tablet 0   Calcium Carb-Cholecalciferol 600-10 MG-MCG TABS Take 1 tablet by mouth daily.     cetirizine (ZYRTEC) 10 MG tablet Take 10 mg by mouth daily.     COLLAGEN PO Take 1 tablet by mouth daily.      escitalopram (LEXAPRO) 20 MG tablet Take 1 tablet (20 mg total) by mouth daily. 90 tablet 0   etonogestrel (NEXPLANON) 68 MG IMPL implant 1 each by Subdermal route once.     ferrous sulfate 325 (65 FE) MG tablet Take 325 mg by mouth daily with breakfast.     fluticasone (FLONASE) 50 MCG/ACT nasal spray Place 1 spray into both nostrils daily.     Ginger, Zingiber officinalis, (GINGER PO) Take 1 tablet by mouth daily.     magnesium 30 MG tablet Take 200 mg by mouth 2 (two) times daily.     Multiple Vitamins-Iron (MULTIVITAMINS WITH IRON) TABS tablet Take 1 tablet by mouth daily.     omeprazole (PRILOSEC) 20 MG capsule Take 1 capsule (20 mg total) by mouth daily. 30 capsule 3   Turmeric (QC TUMERIC COMPLEX PO) Take 1 tablet by mouth daily.      Vitamin A (A-25 PO) Take 1 tablet by mouth daily.     zinc gluconate 50 MG tablet Take 50 mg by mouth daily.     hydrOXYzine (ATARAX) 50 MG tablet take 1 tablet (50 mg) by mouth up to 3 times per day as needed 90 tablet 2   magic mouthwash SOLN Swish and spit 5 mL 3 times  daily for 5 days. 120 mL 0   methocarbamol (ROBAXIN) 500 MG tablet Take 1 tablet by mouth every 8 hours as needed for muscle spasms. 30 tablet 0   methylPREDNISolone (MEDROL DOSEPAK) 4 MG TBPK tablet Take as directed on package. 21 tablet 0   metoprolol tartrate (LOPRESSOR) 25 MG tablet Take 1 tablet by mouth 2 times daily. (Patient taking differently: Take 25 mg by mouth daily.) 60 tablet 0   nystatin-dexamethasone-diphenhydrAMINE Swish and spit 5 ml by mouth 3 times a day for 5 days 120 mL 0   ondansetron (ZOFRAN-ODT) 4 MG disintegrating tablet Dissolve 1 tablet by mouth every 6 hours as needed for nausea. 20 tablet 0   oxyCODONE (OXY IR/ROXICODONE) 5 MG immediate release tablet Take 1 tablet by mouth every 6 hours as needed for severe pain. 15 tablet 0   terconazole (TERAZOL 7) 0.4 % vaginal cream Apply twice daily to vulvovaginal area, can reapply after every void, use for 7 days as needed 45 g 0   No facility-administered medications prior to visit.    Allergies  Allergen Reactions   Lamotrigine Other (See Comments)    Patient had headaches, abdominal cramping   Wellbutrin Xl [Bupropion] Other (See Comments)    Abdominal pain   Latex Rash and Other (See Comments)    Burning    ROS     Objective:    Physical Exam  BP 116/78 (BP Location: Left Arm, Patient Position: Sitting, Cuff Size: Large)   Pulse 76   Temp 97.8 F (36.6 C) (Temporal)   Ht 5' 5.75" (1.67 m)   SpO2 98%   BMI 42.97 kg/m  Wt Readings from Last 3 Encounters:  10/08/22 264 lb 3.2 oz (119.8 kg)  09/24/22 261 lb 6.4 oz (118.6 kg)  09/23/22 257 lb (116.6 kg)   Alert and in no distress.  Pharyngeal area is normal. Pulse with regular  rate. Respirations unlabored. Skin is warm and dry, no erythema or rash on arms.      Assessment & Plan:   Problem List Items Addressed This Visit   None Visit Diagnoses     Pruritus    -  Primary   Relevant Medications   hydrOXYzine (VISTARIL) 25 MG capsule   Other Relevant Orders   CBC with Differential/Platelet   Elevated LFTs       Relevant Orders   Hepatic function panel      No distress.  No rash.  Recommend looking for triggers for itching.  Take Zyrtec twice daily and hydroxyzine at bedtime as needed for itching.  Discussed that hydroxyzine is sedating. Use a good moisturizer. Cool showers and cool compresses.  Check labs including CBC and hepatic function since history of elevated alk phos and AST. Follow-up if any new or worsening symptoms and pending lab results.   I have discontinued Adie Menger's hydrOXYzine, methocarbamol, ondansetron, metoprolol tartrate, oxyCODONE, magic mouthwash, nystatin-dexamethasone-diphenhydrAMINE, terconazole, and methylPREDNISolone. I am also having her start on hydrOXYzine. Additionally, I am having her maintain her COLLAGEN PO, multivitamins with iron, fluticasone, Calcium Carb-Cholecalciferol, cetirizine, Nexplanon, ferrous sulfate, zinc gluconate, magnesium, Vitamin A (A-25 PO), Turmeric (QC TUMERIC COMPLEX PO), (Ginger, Zingiber officinalis, (GINGER PO)), escitalopram, ALPRAZolam, acetaminophen, and omeprazole.  Meds ordered this encounter  Medications   hydrOXYzine (VISTARIL) 25 MG capsule    Sig: Take 1 capsule (25 mg total) by mouth every 8 (eight) hours as needed.    Dispense:  30 capsule    Refill:  0    Order  Specific Question:   Supervising Provider    Answer:   Pricilla Holm A [[id number]]

## 2022-11-04 NOTE — Patient Instructions (Addendum)
Please go downstairs for labs.   Increase Zyrtec to twice daily and take the hydroxyzine at bedtime. This medication may cause drowsiness.   Use a moisturizer (not a lotion) and topical antihistamines as needed.   Cool showers/baths and an ice pack as needed on areas that are itching.   Let me know if you figure out a trigger or if you develop a rash or any new symptoms.

## 2022-11-05 ENCOUNTER — Telehealth (HOSPITAL_BASED_OUTPATIENT_CLINIC_OR_DEPARTMENT_OTHER): Payer: 59 | Admitting: Psychiatry

## 2022-11-05 ENCOUNTER — Other Ambulatory Visit (HOSPITAL_COMMUNITY): Payer: Self-pay

## 2022-11-05 ENCOUNTER — Encounter (HOSPITAL_COMMUNITY): Payer: Self-pay | Admitting: Psychiatry

## 2022-11-05 DIAGNOSIS — T1490XA Injury, unspecified, initial encounter: Secondary | ICD-10-CM | POA: Diagnosis not present

## 2022-11-05 DIAGNOSIS — F331 Major depressive disorder, recurrent, moderate: Secondary | ICD-10-CM

## 2022-11-05 DIAGNOSIS — F410 Panic disorder [episodic paroxysmal anxiety] without agoraphobia: Secondary | ICD-10-CM

## 2022-11-05 MED ORDER — ESCITALOPRAM OXALATE 20 MG PO TABS
20.0000 mg | ORAL_TABLET | Freq: Every day | ORAL | 0 refills | Status: DC
  Filled 2022-11-05 – 2022-12-18 (×3): qty 90, 90d supply, fill #0

## 2022-11-05 MED ORDER — ALPRAZOLAM 0.5 MG PO TABS
ORAL_TABLET | ORAL | 0 refills | Status: DC
  Filled 2022-11-05 – 2022-11-14 (×2): qty 20, 20d supply, fill #0

## 2022-11-05 NOTE — Progress Notes (Signed)
Virtual Visit via Video Note  I connected with Krista Fisher on 11/05/22 at  3:00 PM EST by a video enabled telemedicine application and verified that I am speaking with the correct person using two identifiers.  Location: Patient: Work Provider: Biomedical scientist   I discussed the limitations of evaluation and management by telemedicine and the availability of in person appointments. The patient expressed understanding and agreed to proceed.  History of Present Illness: Patient is evaluated by video session.  Patient told she had a medical emergency treatments ago.  She had a strangulated hernia and required ICU treatment and surgery.  She is now feeling better.  She was out of work for 8 weeks and recently started working again.  She reported that has caused a lot of anxiety and she noticed having crying spells, nightmares but denies any suicidal thoughts or homicidal thoughts.  She is slowly and gradually feeling better.  She has a good support from her family.  Patient reported court date in September was moved to December because her ex did not show up.  They extend the date because her ex has cancer.  Patient is still have 71 B against him.  Patient tried to see a therapist but could not connect and looking for a new therapist.  She still have nightmares and flashback about her past trauma.  She has taken few times Xanax.  We have given 20 tablets 3 months ago.  She is taking Lexapro which she believes helping her depression and anxiety.  She had lost almost 30 pounds since she had surgery for the strangulated hernia.  Her level is slowly and gradually getting better.  She does not want to change the medication but hoping to find a better therapist.  She is also doing masters online and hoping to finish and graduate in fall 2024.  Recently she had a with her family and friends to [place] and she really enjoyed.  Patient reported no side effects from the medication.  Past Psychiatric History: H/O  depression, anxiety and childhood trauma. Saw psychiatrist once but most of the time medicines were given by PCP. Prozac caused nightmares and Celexa caused increased anxiety.  Wellbutrin but Paxil help the most until stopped on her own.  No history of suicidal attempt.  No history of psychiatric inpatient treatment.       Psychiatric Specialty Exam: Physical Exam  Review of Systems  Weight 251 lb (113.9 kg).There is no height or weight on file to calculate BMI.  General Appearance: Casual  Eye Contact:  Good  Speech:  Clear and Coherent  Volume:  Normal  Mood:  Anxious and Dysphoric  Affect:  Constricted  Thought Process:  Goal Directed  Orientation:  Full (Time, Place, and Person)  Thought Content:  Rumination  Suicidal Thoughts:  No  Homicidal Thoughts:  No  Memory:  Immediate;   Good Recent;   Good Remote;   Good  Judgement:  Intact  Insight:  Present  Psychomotor Activity:  Decreased  Concentration:  Concentration: Fair and Attention Span: Good  Recall:  Good  Fund of Knowledge:  Good  Language:  Good  Akathisia:  No  Handed:  Right  AIMS (if indicated):     Assets:  Communication Skills Desire for Improvement Housing  ADL's:  Intact  Cognition:  WNL  Sleep:   fair, nightmares      Assessment and Plan: PTSD.  Major depressive disorder, recurrent.  Panic attacks.  Review records from recent hospitalization and  blood work results.  She had lost weight because of surgery for strangulated hernia.  She is back on work.  Things are slowly and gradually getting better.  She has a court date in December and hoping to resolve issues and had a decision in her favor.  We will provide some referral for therapy.  Continue Lexapro 20 mg daily and Xanax 0.5 mg to take as needed for anxiety #20.  Recommended to call us back if she has any question or any concern.  Follow-up in 3 months.  Follow Up Instructions:    I discussed the assessment and treatment plan with the patient.  The patient was provided an opportunity to ask questions and all were answered. The patient agreed with the plan and demonstrated an understanding of the instructions.   The patient was advised to call back or seek an in-person evaluation if the symptoms worsen or if the condition fails to improve as anticipated.  Collaboration of Care: Other provider involved in patient's care AEB notes are available in epic to review.  Patient/Guardian was advised Release of Information must be obtained prior to any record release in order to collaborate their care with an outside provider. Patient/Guardian was advised if they have not already done so to contact the registration department to sign all necessary forms in order for [place] to release information regarding their care.   Consent: Patient/Guardian gives verbal consent for treatment and assignment of benefits for services provided during this visit. Patient/Guardian expressed understanding and agreed to proceed.    I provided 20 minutes of non-face-to-face time during this encounter.   Kathlee Nations, MD

## 2022-11-12 ENCOUNTER — Telehealth: Payer: 59 | Admitting: Physician Assistant

## 2022-11-12 ENCOUNTER — Other Ambulatory Visit (HOSPITAL_COMMUNITY): Payer: Self-pay

## 2022-11-12 DIAGNOSIS — U071 COVID-19: Secondary | ICD-10-CM | POA: Diagnosis not present

## 2022-11-12 MED ORDER — BENZONATATE 100 MG PO CAPS
100.0000 mg | ORAL_CAPSULE | Freq: Three times a day (TID) | ORAL | 0 refills | Status: DC | PRN
  Filled 2022-11-12: qty 30, 10d supply, fill #0

## 2022-11-12 NOTE — Progress Notes (Signed)
I have spent 5 minutes in review of e-visit questionnaire, review and updating patient chart, medical decision making and response to patient.   Crysta Gulick Cody Cherylann Hobday, PA-C    

## 2022-11-12 NOTE — Progress Notes (Signed)
E-Visit  for Positive Covid Test Result  We are sorry you are not feeling well. We are here to help!  You have tested positive for COVID-19, meaning that you were infected with the novel coronavirus and could give the virus to others.  It is vitally important that you stay home so you do not spread it to others.      Please continue isolation at home, for at least 10 days since the start of your symptoms and until you have had 24 hours with no fever (without taking a fever reducer) and with improving of symptoms.  If you have no symptoms but tested positive (or all symptoms resolve after 5 days and you have no fever) you can leave your house but continue to wear a mask around others for an additional 5 days. If you have a fever,continue to stay home until you have had 24 hours of no fever. Most cases improve 5-10 days from onset but we have seen a small number of patients who have gotten worse after the 10 days.  Please be sure to watch for worsening symptoms and remain taking the proper precautions.   Go to the nearest hospital ED for assessment if fever/cough/breathlessness are severe or illness seems like a threat to life.    The following symptoms may appear 2-14 days after exposure: Fever Cough Shortness of breath or difficulty breathing Chills Repeated shaking with chills Muscle pain Headache Sore throat New loss of taste or smell Fatigue Congestion or runny nose Nausea or vomiting Diarrhea  You have been enrolled in [facility] for COVID-19. Daily you will receive a questionnaire within the [facility] website. Our COVID-19 response team will be monitoring your responses daily.  You can use medication such as prescription cough medication called Tessalon Perles 100 mg. You may take 1-2 capsules every 8 hours as needed for cough  You may also take acetaminophen (Tylenol) as needed for fever.   I have sent a work note to Doctor, hospital. You can find by going to the  Menu on your homepage, scrolling down to the Communications section, and selecting Letters. Let us know if you have any issue locating. Take care and feel better soon!  HOME CARE: Only take medications as instructed by your medical team. Drink plenty of fluids and get plenty of rest. A steam or ultrasonic humidifier can help if you have congestion.   GET HELP RIGHT AWAY IF YOU HAVE EMERGENCY WARNING SIGNS.  Call 911 or proceed to your closest emergency facility if: You develop worsening high fever. Trouble breathing Bluish lips or face Persistent pain or pressure in the chest New confusion Inability to wake or stay awake You cough up blood. Your symptoms become more severe Inability to hold down food or fluids  This list is not all possible symptoms. Contact your medical provider for any symptoms that are severe or concerning to you.    Your e-visit answers were reviewed by a board certified advanced clinical practitioner to complete your personal care plan.  Depending on the condition, your plan could have included both over the counter or prescription medications.  If there is a problem please reply once you have received a response from your provider.  Your safety is important to [place].  If you have drug allergies check your prescription carefully.    You can use MyChart to ask questions about today's visit, request a non-urgent call back, or ask for a work or school excuse for  24 hours related to this e-Visit. If it has been greater than 24 hours you will need to follow up with your provider, or enter a new e-Visit to address those concerns. You will get an e-mail in the next two days asking about your experience.  I hope that your e-visit has been valuable and will speed your recovery. Thank you for using e-visits.

## 2022-11-14 ENCOUNTER — Other Ambulatory Visit (HOSPITAL_COMMUNITY): Payer: Self-pay

## 2022-11-18 ENCOUNTER — Other Ambulatory Visit (HOSPITAL_COMMUNITY): Payer: Self-pay

## 2022-11-18 ENCOUNTER — Other Ambulatory Visit (INDEPENDENT_AMBULATORY_CARE_PROVIDER_SITE_OTHER): Payer: 59

## 2022-11-18 DIAGNOSIS — R7989 Other specified abnormal findings of blood chemistry: Secondary | ICD-10-CM | POA: Diagnosis not present

## 2022-11-18 DIAGNOSIS — L299 Pruritus, unspecified: Secondary | ICD-10-CM | POA: Diagnosis not present

## 2022-11-18 LAB — HEPATIC FUNCTION PANEL
ALT: 29 U/L (ref 0–35)
AST: 39 U/L — ABNORMAL HIGH (ref 0–37)
Albumin: 4.1 g/dL (ref 3.5–5.2)
Alkaline Phosphatase: 144 U/L — ABNORMAL HIGH (ref 39–117)
Bilirubin, Direct: 0.1 mg/dL (ref 0.0–0.3)
Total Bilirubin: 0.4 mg/dL (ref 0.2–1.2)
Total Protein: 7.7 g/dL (ref 6.0–8.3)

## 2022-11-18 LAB — CBC WITH DIFFERENTIAL/PLATELET
Basophils Absolute: 0 10*3/uL (ref 0.0–0.1)
Basophils Relative: 0.5 % (ref 0.0–3.0)
Eosinophils Absolute: 0.1 10*3/uL (ref 0.0–0.7)
Eosinophils Relative: 2 % (ref 0.0–5.0)
HCT: 39.8 % (ref 36.0–46.0)
Hemoglobin: 13.4 g/dL (ref 12.0–15.0)
Lymphocytes Relative: 31.7 % (ref 12.0–46.0)
Lymphs Abs: 2.3 10*3/uL (ref 0.7–4.0)
MCHC: 33.5 g/dL (ref 30.0–36.0)
MCV: 78.1 fl (ref 78.0–100.0)
Monocytes Absolute: 0.6 10*3/uL (ref 0.1–1.0)
Monocytes Relative: 8.6 % (ref 3.0–12.0)
Neutro Abs: 4.1 10*3/uL (ref 1.4–7.7)
Neutrophils Relative %: 57.2 % (ref 43.0–77.0)
Platelets: 258 10*3/uL (ref 150.0–400.0)
RBC: 5.1 Mil/uL (ref 3.87–5.11)
RDW: 14.3 % (ref 11.5–15.5)
WBC: 7.2 10*3/uL (ref 4.0–10.5)

## 2022-11-24 ENCOUNTER — Encounter: Payer: Self-pay | Admitting: Family Medicine

## 2022-12-03 ENCOUNTER — Ambulatory Visit (INDEPENDENT_AMBULATORY_CARE_PROVIDER_SITE_OTHER): Payer: 59 | Admitting: Family Medicine

## 2022-12-03 ENCOUNTER — Encounter: Payer: Self-pay | Admitting: Family Medicine

## 2022-12-03 VITALS — BP 112/78 | HR 75 | Temp 98.4°F | Wt 269.0 lb

## 2022-12-03 DIAGNOSIS — E568 Deficiency of other vitamins: Secondary | ICD-10-CM | POA: Diagnosis not present

## 2022-12-03 DIAGNOSIS — Z9884 Bariatric surgery status: Secondary | ICD-10-CM | POA: Diagnosis not present

## 2022-12-03 DIAGNOSIS — E60 Dietary zinc deficiency: Secondary | ICD-10-CM | POA: Diagnosis not present

## 2022-12-03 DIAGNOSIS — Z9049 Acquired absence of other specified parts of digestive tract: Secondary | ICD-10-CM

## 2022-12-03 DIAGNOSIS — E559 Vitamin D deficiency, unspecified: Secondary | ICD-10-CM

## 2022-12-03 DIAGNOSIS — E58 Dietary calcium deficiency: Secondary | ICD-10-CM | POA: Diagnosis not present

## 2022-12-03 LAB — CBC WITH DIFFERENTIAL/PLATELET
Basophils Absolute: 0 10*3/uL (ref 0.0–0.1)
Basophils Relative: 0.3 % (ref 0.0–3.0)
Eosinophils Absolute: 0.1 10*3/uL (ref 0.0–0.7)
Eosinophils Relative: 1.8 % (ref 0.0–5.0)
HCT: 39.3 % (ref 36.0–46.0)
Hemoglobin: 13.4 g/dL (ref 12.0–15.0)
Lymphocytes Relative: 29.8 % (ref 12.0–46.0)
Lymphs Abs: 2.1 10*3/uL (ref 0.7–4.0)
MCHC: 34 g/dL (ref 30.0–36.0)
MCV: 75.9 fl — ABNORMAL LOW (ref 78.0–100.0)
Monocytes Absolute: 0.5 10*3/uL (ref 0.1–1.0)
Monocytes Relative: 7.4 % (ref 3.0–12.0)
Neutro Abs: 4.3 10*3/uL (ref 1.4–7.7)
Neutrophils Relative %: 60.7 % (ref 43.0–77.0)
Platelets: 248 10*3/uL (ref 150.0–400.0)
RBC: 5.18 Mil/uL — ABNORMAL HIGH (ref 3.87–5.11)
RDW: 14.2 % (ref 11.5–15.5)
WBC: 7 10*3/uL (ref 4.0–10.5)

## 2022-12-03 LAB — COMPREHENSIVE METABOLIC PANEL
ALT: 21 U/L (ref 0–35)
AST: 29 U/L (ref 0–37)
Albumin: 4.1 g/dL (ref 3.5–5.2)
Alkaline Phosphatase: 129 U/L — ABNORMAL HIGH (ref 39–117)
BUN: 14 mg/dL (ref 6–23)
CO2: 27 mEq/L (ref 19–32)
Calcium: 9.7 mg/dL (ref 8.4–10.5)
Chloride: 104 mEq/L (ref 96–112)
Creatinine, Ser: 0.84 mg/dL (ref 0.40–1.20)
GFR: 83 mL/min (ref >60.00)
Glucose, Bld: 97 mg/dL (ref 70–99)
Potassium: 4.5 mEq/L (ref 3.5–5.1)
Sodium: 140 mEq/L (ref 135–145)
Total Bilirubin: 0.3 mg/dL (ref 0.2–1.2)
Total Protein: 7.7 g/dL (ref 6.0–8.3)

## 2022-12-03 LAB — C-REACTIVE PROTEIN: CRP: <1 mg/dL (ref 0.5–20.0)

## 2022-12-03 LAB — FOLATE: Folate: 10.9 ng/mL (ref >5.9)

## 2022-12-03 LAB — T3, FREE: T3, Free: 3.6 pg/mL (ref 2.3–4.2)

## 2022-12-03 LAB — MAGNESIUM: Magnesium: 2.1 mg/dL (ref 1.5–2.5)

## 2022-12-03 LAB — PROTIME-INR
INR: 1 ratio (ref 0.8–1.0)
Prothrombin Time: 11.5 s (ref 9.6–13.1)

## 2022-12-03 LAB — TSH: TSH: 2.54 u[IU]/mL (ref 0.35–5.50)

## 2022-12-03 LAB — HEMOGLOBIN A1C: Hgb A1c MFr Bld: 5.9 % (ref 4.6–6.5)

## 2022-12-03 LAB — PHOSPHORUS: Phosphorus: 5.1 mg/dL — ABNORMAL HIGH (ref 2.3–4.6)

## 2022-12-03 LAB — VITAMIN D 25 HYDROXY (VIT D DEFICIENCY, FRACTURES): VITD: 48.41 ng/mL (ref 30.00–100.00)

## 2022-12-03 LAB — URIC ACID: Uric Acid, Serum: 3.4 mg/dL (ref 2.4–7.0)

## 2022-12-03 LAB — T4, FREE: Free T4: 0.63 ng/dL (ref 0.60–1.60)

## 2022-12-03 LAB — APTT: aPTT: 30.2 s (ref 25.4–36.8)

## 2022-12-03 NOTE — Progress Notes (Signed)
Subjective:    Patient ID: Krista Fisher, female    DOB: [birth year], 46 y.o.   MRN: [id number]  No chief complaint on file.   HPI Patient is a 46 year old female with pmh sig for PCOS, GERD, fatty liver disease, HTN, first-degree AV block, OA, fibroids, obesity s/p biliopancreatic diversion with duodenal switch, h/o bowel resection due to ischemic bowel, s/p cholecystectomy was seen today for follow-up.  Pt requesting labs to monitor vitamin and electrolytes status post bariatric surgery.  Labs previously checked by gastric surgeon, but patient has since been released from their care.  Provided a list via [facility].  Taking multivitamin most days along with supplemental vitamins.  Increased vitamin D recently as PTH was low in the past.  Patient notes some improvement in stress.  Transitioning jobs.  Starts new job in January.  Past Medical History:  Diagnosis Date   GERD (gastroesophageal reflux disease)    Hypertension     Allergies  Allergen Reactions   Lamotrigine Other (See Comments)    Patient had headaches, abdominal cramping   Wellbutrin Xl [Bupropion] Other (See Comments)    Abdominal pain   Latex Rash and Other (See Comments)    Burning    ROS General: Denies fever, chills, night sweats, changes in weight, changes in appetite HEENT: Denies headaches, ear pain, changes in vision, rhinorrhea, sore throat CV: Denies CP, palpitations, SOB, orthopnea Pulm: Denies SOB, cough, wheezing GI: Denies abdominal pain, nausea, vomiting, diarrhea, constipation GU: Denies dysuria, hematuria, frequency, vaginal discharge Msk: Denies muscle cramps, joint pains Neuro: Denies weakness, numbness, tingling Skin: Denies rashes, bruising Psych: Denies depression, anxiety, hallucinations     Objective:    Blood pressure 112/78, pulse 75, temperature 98.4 F (36.9 C), temperature source Oral, weight 269 lb (122 kg), SpO2 98 %.  Gen. Pleasant, well-nourished, in no distress, normal affect    HEENT: [place]/AT, face symmetric, conjunctiva clear, no scleral icterus, PERRLA, EOMI, nares patent without drainage Lungs: no accessory muscle use Cardiovascular: RRR, no peripheral edema Neuro:  A&Ox3, CN II-XII intact, normal gait Skin:  Warm, no lesions/ rash   Wt Readings from Last 3 Encounters:  12/03/22 269 lb (122 kg)  10/08/22 264 lb 3.2 oz (119.8 kg)  09/24/22 261 lb 6.4 oz (118.6 kg)    Lab Results  Component Value Date   WBC 7.2 11/18/2022   HGB 13.4 11/18/2022   HCT 39.8 11/18/2022   PLT 258.0 11/18/2022   GLUCOSE 80 09/24/2022   CHOL 145 10/31/2009   TRIG 70 08/14/2022   HDL 37 (L) 10/31/2009   LDLCALC 91 10/31/2009   ALT 29 11/18/2022   AST 39 (H) 11/18/2022   NA 138 09/24/2022   K 4.3 09/24/2022   CL 103 09/24/2022   CREATININE 0.95 09/24/2022   BUN 10 09/24/2022   CO2 30 09/24/2022   TSH 2.80 09/12/2022   INR 1.0 09/06/2020   HGBA1C 5.0 08/08/2022    Assessment/Plan:  Status post biliopancreatic diversion with duodenal switch  - Plan: Vitamin D, 25-hydroxy, TSH, CMP, CBC with Differential/Platelet, Iron, TIBC and Ferritin Panel, Parathyroid hormone, intact (no Ca), Phosphorus, Magnesium, Hemoglobin A1c, Vitamin B1, Folate, Protime-INR, APTT, C-reactive Protein, Copper, Serum, Uric Acid, T4, Free, T3, Free, Zinc, Vitamin B6, Vitamin K1, Serum  S/P small bowel resection - Plan: CMP, CBC with Differential/Platelet, Iron, TIBC and Ferritin Panel, Parathyroid hormone, intact (no Ca), Phosphorus, Magnesium, Hemoglobin A1c  Vitamin D deficiency - Plan: Vitamin D, 25-hydroxy  Dietary zinc deficiency - Plan: Zinc  Dietary calcium deficiency - Plan: CMP, Parathyroid hormone, intact (no Ca)  Deficiency of other vitamins - Plan: Copper, Serum, Vitamin B6, Vitamin K1, Serum  Patient requesting labs to monitor status of biliopancreatic diversion with duodenal switch.  Encouraged to continue taking multivitamins and additional supplemental vitamins daily.  Will  obtain requested labs to evaluate for any electrolyte abnormalities due to malabsorption from gastric procedure or small bowel resection.  F/u prn  Grier Mitts, MD

## 2022-12-07 LAB — IRON,TIBC AND FERRITIN PANEL
%SAT: 13 % (calc) — ABNORMAL LOW (ref 16–45)
Ferritin: 7 ng/mL — ABNORMAL LOW (ref 16–232)
Iron: 54 ug/dL (ref 40–190)
TIBC: 422 mcg/dL (calc) (ref 250–450)

## 2022-12-07 LAB — EXTRA SPECIMEN

## 2022-12-07 LAB — ZINC: Zinc: 51 ug/dL — ABNORMAL LOW (ref 60–130)

## 2022-12-07 LAB — VITAMIN B1: Vitamin B1 (Thiamine): 11 nmol/L (ref 8–30)

## 2022-12-07 LAB — VITAMIN B6: Vitamin B6: 13.1 ng/mL (ref 2.1–21.7)

## 2022-12-07 LAB — COPPER, SERUM: Copper: 147 ug/dL (ref 70–175)

## 2022-12-07 LAB — PARATHYROID HORMONE, INTACT (NO CA): PTH: 90 pg/mL — ABNORMAL HIGH (ref 16–77)

## 2022-12-08 LAB — VITAMIN K1, SERUM: Vitamin K: 139 pg/mL (ref 130–1500)

## 2022-12-10 ENCOUNTER — Encounter: Payer: Self-pay | Admitting: Family Medicine

## 2022-12-10 ENCOUNTER — Ambulatory Visit (INDEPENDENT_AMBULATORY_CARE_PROVIDER_SITE_OTHER): Payer: 59 | Admitting: Family Medicine

## 2022-12-10 VITALS — BP 104/76 | HR 87 | Temp 99.5°F | Wt 269.6 lb

## 2022-12-10 DIAGNOSIS — J069 Acute upper respiratory infection, unspecified: Secondary | ICD-10-CM | POA: Diagnosis not present

## 2022-12-10 DIAGNOSIS — H6123 Impacted cerumen, bilateral: Secondary | ICD-10-CM | POA: Diagnosis not present

## 2022-12-10 DIAGNOSIS — J029 Acute pharyngitis, unspecified: Secondary | ICD-10-CM

## 2022-12-10 LAB — POCT RAPID STREP A (OFFICE): Rapid Strep A Screen: NEGATIVE

## 2022-12-10 LAB — POC COVID19 BINAXNOW: SARS Coronavirus 2 Ag: NEGATIVE

## 2022-12-10 LAB — POC INFLUENZA A&B (BINAX/QUICKVUE)
Influenza A, POC: NEGATIVE
Influenza B, POC: NEGATIVE

## 2022-12-10 NOTE — Progress Notes (Signed)
   Acute Office Visit  Subjective:     Patient ID: Krista Fisher, female    DOB: 1976/10/19, 46 y.o.   MRN: [id number]  Chief Complaint  Patient presents with   Sore Throat    Pt reports sx of runny nose,nasal congestion, sore throat started yesterday. Also right side of neck is swollen. Using nasal spray.     HPI Patient is in today for acute concern of sore throat, ear pressure R>L, nasal congestion, HA in afternoon, and rhinorrhea x 2 days.  Pt felt like her throat moved/shifted while sitting in bed.  Top teeth felt sore.  Denies cough, n/v, diarrhea, ear pain, facial pain/pressure, fever, chills.  ROS +nasal congestion, rhinorrhea, sore throat.     Objective:    BP 104/76 (BP Location: Left Arm, Patient Position: Sitting, Cuff Size: Large)   Pulse 87   Temp 99.5 F (37.5 C) (Oral)   Wt 269 lb 9.6 oz (122.3 kg)   SpO2 98%   BMI 43.85 kg/m    Physical Exam Gen. Pleasant, well developed, well-nourished, in NAD HEENT - [place]/AT, PERRL, EOMI, conjunctive clear, no scleral icterus, no nasal drainage, pharynx without erythema or exudate.  B/l canals impacted with cerumen.  No cervical lymphadenopathy Lungs: no use of accessory muscles, CTAB, no wheezes, rales or rhonchi Cardiovascular: RRR, No r/g/m, no peripheral edema Musculoskeletal: No deformities, moves all four extremities, no cyanosis or clubbing, normal tone Neuro:  A&Ox3, CN II-XII intact, normal gait Skin:  Warm, dry, intact, no lesions  Results for orders placed or performed in visit on 12/10/22  POC COVID-19  Result Value Ref Range   SARS Coronavirus 2 Ag Negative Negative  POC Rapid Strep A  Result Value Ref Range   Rapid Strep A Screen Negative Negative  POC Influenza A&B(BINAX/QUICKVUE)  Result Value Ref Range   Influenza A, POC Negative Negative   Influenza B, POC Negative Negative        Assessment & Plan:   Problem List Items Addressed This Visit   None Visit Diagnoses     Viral URI    -   Primary   Bilateral impacted cerumen       Sore throat       Relevant Orders   POC COVID-19 (Completed)   POC Rapid Strep A (Completed)   POC Influenza A&B(BINAX/QUICKVUE) (Completed)      COVID, flu, strep testing negative.  Symptoms likely 2/2 other viral etiology.  Consider repeating COVID test in a few days.  Expectant management of symptoms with OTC cough/cold meds.  Antihistamine or flonase for eustachian tube dysfunction.  Consent obtained, b/l ears irrigated.  Pt tolerated procedure well.given precautions.   F/u prn  Billie Ruddy, MD

## 2022-12-18 ENCOUNTER — Other Ambulatory Visit (HOSPITAL_COMMUNITY): Payer: Self-pay

## 2022-12-18 ENCOUNTER — Other Ambulatory Visit: Payer: Self-pay

## 2022-12-23 ENCOUNTER — Other Ambulatory Visit (HOSPITAL_COMMUNITY): Payer: Self-pay

## 2023-01-16 ENCOUNTER — Other Ambulatory Visit (HOSPITAL_COMMUNITY): Payer: Self-pay | Admitting: Psychiatry

## 2023-01-16 ENCOUNTER — Other Ambulatory Visit: Payer: Self-pay | Admitting: Family Medicine

## 2023-01-16 ENCOUNTER — Other Ambulatory Visit (HOSPITAL_COMMUNITY): Payer: Self-pay

## 2023-01-16 DIAGNOSIS — F410 Panic disorder [episodic paroxysmal anxiety] without agoraphobia: Secondary | ICD-10-CM

## 2023-01-16 DIAGNOSIS — K219 Gastro-esophageal reflux disease without esophagitis: Secondary | ICD-10-CM

## 2023-01-16 MED ORDER — OMEPRAZOLE 20 MG PO CPDR
20.0000 mg | DELAYED_RELEASE_CAPSULE | Freq: Every day | ORAL | 3 refills | Status: DC
  Filled 2023-01-16: qty 30, 30d supply, fill #0
  Filled 2023-02-17: qty 30, 30d supply, fill #1
  Filled 2023-03-18: qty 30, 30d supply, fill #2

## 2023-02-04 ENCOUNTER — Telehealth (HOSPITAL_COMMUNITY): Payer: Self-pay | Admitting: Psychiatry

## 2023-02-11 ENCOUNTER — Other Ambulatory Visit (HOSPITAL_COMMUNITY): Payer: Self-pay

## 2023-02-17 ENCOUNTER — Other Ambulatory Visit: Payer: Self-pay

## 2023-02-17 ENCOUNTER — Other Ambulatory Visit (HOSPITAL_COMMUNITY): Payer: Self-pay

## 2023-03-04 ENCOUNTER — Other Ambulatory Visit: Payer: Self-pay

## 2023-03-04 ENCOUNTER — Encounter (HOSPITAL_BASED_OUTPATIENT_CLINIC_OR_DEPARTMENT_OTHER): Payer: Self-pay | Admitting: *Deleted

## 2023-03-04 ENCOUNTER — Emergency Department (HOSPITAL_BASED_OUTPATIENT_CLINIC_OR_DEPARTMENT_OTHER): Payer: No Typology Code available for payment source

## 2023-03-04 ENCOUNTER — Emergency Department (HOSPITAL_BASED_OUTPATIENT_CLINIC_OR_DEPARTMENT_OTHER)
Admission: EM | Admit: 2023-03-04 | Discharge: 2023-03-04 | Disposition: A | Discharge status: Home / Self Care | Payer: No Typology Code available for payment source | Attending: Emergency Medicine | Admitting: Emergency Medicine

## 2023-03-04 DIAGNOSIS — R1013 Epigastric pain: Secondary | ICD-10-CM | POA: Diagnosis present

## 2023-03-04 DIAGNOSIS — I10 Essential (primary) hypertension: Secondary | ICD-10-CM | POA: Insufficient documentation

## 2023-03-04 DIAGNOSIS — K2970 Gastritis, unspecified, without bleeding: Secondary | ICD-10-CM

## 2023-03-04 DIAGNOSIS — Z91040 Latex allergy status: Secondary | ICD-10-CM | POA: Diagnosis not present

## 2023-03-04 LAB — CBC WITH DIFFERENTIAL/PLATELET
Abs Immature Granulocytes: 0.02 10*3/uL (ref 0.00–0.07)
Basophils Absolute: 0 10*3/uL (ref 0.0–0.1)
Basophils Relative: 1 %
Eosinophils Absolute: 0.1 10*3/uL (ref 0.0–0.5)
Eosinophils Relative: 1 %
HCT: 38.7 % (ref 36.0–46.0)
Hemoglobin: 13.5 g/dL (ref 12.0–15.0)
Immature Granulocytes: 0 %
Lymphocytes Relative: 26 %
Lymphs Abs: 1.7 10*3/uL (ref 0.7–4.0)
MCH: 26.3 pg (ref 26.0–34.0)
MCHC: 34.9 g/dL (ref 30.0–36.0)
MCV: 75.4 fL — ABNORMAL LOW (ref 80.0–100.0)
Monocytes Absolute: 0.6 10*3/uL (ref 0.1–1.0)
Monocytes Relative: 10 %
Neutro Abs: 4 10*3/uL (ref 1.7–7.7)
Neutrophils Relative %: 62 %
Platelets: 226 10*3/uL (ref 150–400)
RBC: 5.13 MIL/uL — ABNORMAL HIGH (ref 3.87–5.11)
RDW: 14.9 % (ref 11.5–15.5)
WBC: 6.4 10*3/uL (ref 4.0–10.5)
nRBC: 0 % (ref 0.0–0.2)

## 2023-03-04 LAB — COMPREHENSIVE METABOLIC PANEL
ALT: 48 U/L — ABNORMAL HIGH (ref 0–44)
AST: 107 U/L — ABNORMAL HIGH (ref 15–41)
Albumin: 4.3 g/dL (ref 3.5–5.0)
Alkaline Phosphatase: 119 U/L (ref 38–126)
Anion gap: 6 (ref 5–15)
BUN: 15 mg/dL (ref 6–20)
CO2: 28 mmol/L (ref 22–32)
Calcium: 9.7 mg/dL (ref 8.9–10.3)
Chloride: 104 mmol/L (ref 98–111)
Creatinine, Ser: 0.85 mg/dL (ref 0.44–1.00)
GFR, Estimated: >60 mL/min (ref >60)
Glucose, Bld: 99 mg/dL (ref 70–99)
Potassium: 4.3 mmol/L (ref 3.5–5.1)
Sodium: 138 mmol/L (ref 135–145)
Total Bilirubin: 0.4 mg/dL (ref 0.3–1.2)
Total Protein: 7.7 g/dL (ref 6.5–8.1)

## 2023-03-04 LAB — LIPASE, BLOOD: Lipase: 41 U/L (ref 11–51)

## 2023-03-04 LAB — URINALYSIS, ROUTINE W REFLEX MICROSCOPIC
Bilirubin Urine: NEGATIVE
Glucose, UA: NEGATIVE mg/dL
Hgb urine dipstick: NEGATIVE
Ketones, ur: NEGATIVE mg/dL
Leukocytes,Ua: NEGATIVE
Nitrite: NEGATIVE
Specific Gravity, Urine: 1.024 (ref 1.005–1.030)
pH: 6 (ref 5.0–8.0)

## 2023-03-04 LAB — PREGNANCY, URINE: Preg Test, Ur: NEGATIVE

## 2023-03-04 MED ORDER — DICYCLOMINE HCL 10 MG PO CAPS
10.0000 mg | ORAL_CAPSULE | Freq: Once | ORAL | Status: AC
  Administered 2023-03-04: 10 mg via ORAL
  Filled 2023-03-04: qty 1

## 2023-03-04 MED ORDER — SUCRALFATE 1 G PO TABS
1.0000 g | ORAL_TABLET | Freq: Three times a day (TID) | ORAL | 0 refills | Status: DC
  Filled 2023-03-04: qty 21, 7d supply, fill #0

## 2023-03-04 MED ORDER — KETOROLAC TROMETHAMINE 30 MG/ML IJ SOLN
30.0000 mg | Freq: Once | INTRAMUSCULAR | Status: AC
  Administered 2023-03-04: 30 mg via INTRAVENOUS
  Filled 2023-03-04: qty 1

## 2023-03-04 MED ORDER — LIDOCAINE VISCOUS HCL 2 % MT SOLN
15.0000 mL | Freq: Once | OROMUCOSAL | Status: AC
  Administered 2023-03-04: 15 mL via ORAL
  Filled 2023-03-04: qty 15

## 2023-03-04 MED ORDER — SODIUM CHLORIDE 0.9 % IV BOLUS
1000.0000 mL | Freq: Once | INTRAVENOUS | Status: AC
  Administered 2023-03-04: 1000 mL via INTRAVENOUS

## 2023-03-04 MED ORDER — ALUM & MAG HYDROXIDE-SIMETH 200-200-20 MG/5ML PO SUSP
30.0000 mL | Freq: Once | ORAL | Status: AC
  Administered 2023-03-04: 30 mL via ORAL
  Filled 2023-03-04: qty 30

## 2023-03-04 MED ORDER — IOHEXOL 300 MG/ML  SOLN
100.0000 mL | Freq: Once | INTRAMUSCULAR | Status: AC | PRN
  Administered 2023-03-04: 100 mL via INTRAVENOUS

## 2023-03-04 MED ORDER — FAMOTIDINE IN NACL 20-0.9 MG/50ML-% IV SOLN
20.0000 mg | Freq: Once | INTRAVENOUS | Status: AC
  Administered 2023-03-04: 20 mg via INTRAVENOUS
  Filled 2023-03-04: qty 50

## 2023-03-04 NOTE — ED Notes (Signed)
RN reviewed discharge instructions with pt. Pt verbalized understanding and had no further questions. VSS upon discharge.  

## 2023-03-04 NOTE — ED Provider Notes (Signed)
[facility] Provider Note  CSN: [id number] Arrival date & time: 03/04/23 1509  Chief Complaint(s) Abdominal Pain  HPI Krista Fisher is a 47 y.o. female with past medical history as below, significant for GERD, HTN, cholecystectomy, prior SBO s/p resection who presents to the ED with complaint of epigastric abdominal pain.  Patient reports she began having abdominal pain epigastric/ruq this afternoon.  Patient reports she had a small bowel movement and the symptoms did mildly improve.  No BRBPR or melena.  Reports regular bowel movements following her abdominal surgery.  No vomiting the last 24 hours did have some nausea few days ago.  No fevers or chills, no recent diet or medication change, no medication prior to arrival per report symptoms have significantly improved from the onset.  No abdominal trauma.  No change in urinary function.  No vaginal bleeding or discharge that seems abnormal.  No hematuria.  Past Medical History Past Medical History:  Diagnosis Date   GERD (gastroesophageal reflux disease)    Hypertension    Patient Active Problem List   Diagnosis Date Noted   Acute bilateral low back pain without sciatica 08/19/2022   Anxiety 08/19/2022   S/P hernia surgery 08/19/2022   S/P small bowel resection 08/19/2022   Hospital discharge follow-up 08/19/2022   Abdominal pain 08/08/2022   Fibroid 09/06/2020   Fibroids 09/06/2020   Diarrhea due to malabsorption 07/02/2020   Anal fissure 04/05/2020   Hemorrhoids 04/05/2020   Dysmenorrhea 02/07/2020   Polycystic ovary syndrome 02/07/2020   Status post biliopancreatic diversion with duodenal switch 01/17/2020   Excessive daytime sleepiness 01/17/2020   Hypersomnia with sleep apnea 01/17/2020   Loud snoring 01/17/2020   Morbid obesity with body mass index of 50 or higher ([place]) 01/17/2020   Arm numbness 03/08/2018   Osteoarthrosis involving lower leg 05/06/2011   EDEMA 02/04/2011    CHEST PAIN-UNSPECIFIED 02/04/2011   Vitamin D deficiency 12/03/2009   ALLERGIC RHINITIS 12/03/2009   Morbid obesity ([place]) 10/31/2009   DEPRESSION 10/31/2009   HYPERTENSION, BENIGN ESSENTIAL 10/31/2009   BLOCK, OTHER HEART 10/31/2009   URI 10/31/2009   GERD 10/31/2009   SLEEP APNEA 10/31/2009   PALPITATIONS 10/31/2009   1st degree AV block 10/31/2009   Major depressive disorder, single episode, unspecified 10/31/2009   Fatty liver disease, nonalcoholic [id number]   Chronic post-traumatic stress disorder (PTSD) 04/05/1994   Home Medication(s) Prior to Admission medications   Medication Sig Start Date End Date Taking? Authorizing Provider  sucralfate (CARAFATE) 1 g tablet Take 1 tablet (1 g total) by mouth with breakfast, with lunch, and with evening meal for 7 days. 03/04/23 03/11/23 Yes Jeanell Sparrow, DO  acetaminophen (TYLENOL) 500 MG tablet Take 500 mg by mouth every 6 (six) hours as needed.    [provider]  ALPRAZolam Duanne Moron) 0.5 MG tablet Take 1 tablet by mouth daily as needed for severe anxiety/panic attack. 11/05/22   Arfeen, Arlyce Harman, MD  Calcium Carb-Cholecalciferol 600-10 MG-MCG TABS Take 1 tablet by mouth daily. 01/25/20   [provider]  cetirizine (ZYRTEC) 10 MG tablet Take 10 mg by mouth daily.    [provider]  COLLAGEN PO Take 1 tablet by mouth daily.     [provider]  escitalopram (LEXAPRO) 20 MG tablet Take 1 tablet (20 mg total) by mouth daily. 11/05/22   Arfeen, Arlyce Harman, MD  etonogestrel (NEXPLANON) 68 MG IMPL implant 1 each by Subdermal route once. 06/06/20   [provider]  ferrous sulfate 325 (65 FE) MG tablet Take 325 mg by mouth daily with breakfast.    [provider]  fluticasone (FLONASE) 50 MCG/ACT nasal spray Place 1 spray into both nostrils daily.    [provider]  Ginger, Zingiber officinalis, (GINGER PO) Take 1 tablet by mouth daily.    [provider]  magnesium 30 MG tablet Take  200 mg by mouth 2 (two) times daily.    [provider]  Multiple Vitamins-Iron (MULTIVITAMINS WITH IRON) TABS tablet Take 1 tablet by mouth daily.    [provider]  omeprazole (PRILOSEC) 20 MG capsule Take 1 capsule (20 mg total) by mouth daily. 01/16/23   Billie Ruddy, MD  Turmeric (QC TUMERIC COMPLEX PO) Take 1 tablet by mouth daily.    [provider]  Vitamin A (A-25 PO) Take 1 tablet by mouth daily.    [provider]  zinc gluconate 50 MG tablet Take 50 mg by mouth daily.    [provider]                                                                                                                                    Past Surgical History Past Surgical History:  Procedure Laterality Date   CESAREAN SECTION     CHOLECYSTECTOMY     GASTRECTOMY     GASTRIC BYPASS     IR ANGIOGRAM PELVIS SELECTIVE OR SUPRASELECTIVE  09/06/2020   IR ANGIOGRAM SELECTIVE EACH ADDITIONAL VESSEL  09/06/2020   IR ANGIOGRAM SELECTIVE EACH ADDITIONAL VESSEL  09/06/2020   IR EMBO TUMOR ORGAN ISCHEMIA INFARCT INC GUIDE ROADMAPPING  09/06/2020   IR RADIOLOGIST EVAL & MGMT  07/11/2020   IR RADIOLOGIST EVAL & MGMT  09/19/2020   IR RADIOLOGIST EVAL & MGMT  05/08/2021   IR US GUIDE VASC ACCESS LEFT  09/06/2020   KNEE SURGERY     LAPAROSCOPY N/A 08/08/2022   Procedure: LAPAROSCOPY DIAGNOSTIC;  Surgeon: Felicie Morn, MD;  Location: [facility];  Service: General;  Laterality: N/A;   LAPAROTOMY N/A 08/08/2022   Procedure: EXPLORATORY LAPAROTOMY;  Surgeon: Felicie Morn, MD;  Location: [facility];  Service: General;  Laterality: N/A;   WRIST FRACTURE SURGERY     Family History Family History  Problem Relation Age of Onset   Learning disabilities Father    Hypertension Father    Hyperlipidemia Father    Heart disease Father    Heart failure Father    Diabetes Father    Learning disabilities Son    Depression Son    Depression Paternal Uncle    Drug abuse Paternal  Uncle    Early death Paternal Uncle    Diabetes Maternal Grandmother    Intellectual disability Maternal Grandmother    Hypertension Maternal Grandmother    Hyperlipidemia Maternal Grandmother    Heart disease Maternal Grandmother    Diabetes Paternal Grandmother  Colon cancer Neg Hx    Stomach cancer Neg Hx    Esophageal cancer Neg Hx    Pancreatic cancer Neg Hx     Social History Social History   Tobacco Use   Smoking status: Never   Smokeless tobacco: Never  Vaping Use   Vaping Use: Never used  Substance Use Topics   Alcohol use: No   Drug use: No   Allergies Lamotrigine, Wellbutrin xl [bupropion], and Latex  Review of Systems Review of Systems  Constitutional:  Negative for activity change and fever.  HENT:  Negative for facial swelling and trouble swallowing.   Eyes:  Negative for discharge and redness.  Respiratory:  Negative for cough and shortness of breath.   Cardiovascular:  Negative for chest pain and palpitations.  Gastrointestinal:  Positive for abdominal pain. Negative for anal bleeding, blood in stool, constipation and diarrhea.  Genitourinary:  Negative for dysuria and flank pain.  Musculoskeletal:  Negative for back pain and gait problem.  Skin:  Negative for pallor and rash.  Neurological:  Negative for syncope and headaches.    Physical Exam Vital Signs  I have reviewed the triage vital signs BP 127/76 (BP Location: Right Arm)   Pulse 70   Temp 98 F (36.7 C) (Oral)   Resp 16   SpO2 92%  Physical Exam Vitals and nursing note reviewed.  Constitutional:      General: She is not in acute distress.    Appearance: Normal appearance. She is well-developed. She is obese. She is not ill-appearing.  HENT:     Head: Normocephalic and atraumatic.     Right Ear: External ear normal.     Left Ear: External ear normal.     Nose: Nose normal.     Mouth/Throat:     Mouth: Mucous membranes are moist.  Eyes:     General: No scleral icterus.        Right eye: No discharge.        Left eye: No discharge.  Cardiovascular:     Rate and Rhythm: Normal rate and regular rhythm.     Pulses: Normal pulses.     Heart sounds: Normal heart sounds.  Pulmonary:     Effort: Pulmonary effort is normal. No respiratory distress.     Breath sounds: Normal breath sounds.  Abdominal:     General: Abdomen is flat.     Palpations: Abdomen is soft.     Tenderness: There is abdominal tenderness.       Comments: Nonperitoneal  Musculoskeletal:        General: Normal range of motion.     Cervical back: No rigidity.     Right lower leg: No edema.     Left lower leg: No edema.  Skin:    General: Skin is warm and dry.     Capillary Refill: Capillary refill takes less than 2 seconds.  Neurological:     Mental Status: She is alert.  Psychiatric:        Mood and Affect: Mood normal.        Behavior: Behavior normal.     ED Results and Treatments Labs (all labs ordered are listed, but only abnormal results are displayed) Labs Reviewed  CBC WITH DIFFERENTIAL/PLATELET - Abnormal; Notable for the following components:      Result Value   RBC 5.13 (*)    MCV 75.4 (*)    All other components within normal limits  COMPREHENSIVE METABOLIC PANEL - Abnormal; Notable  for the following components:   AST 107 (*)    ALT 48 (*)    All other components within normal limits  URINALYSIS, ROUTINE W REFLEX MICROSCOPIC - Abnormal; Notable for the following components:   Protein, ur TRACE (*)    All other components within normal limits  LIPASE, BLOOD  PREGNANCY, URINE                                                                                                                          Radiology CT ABDOMEN PELVIS W CONTRAST  Result Date: 03/04/2023 CLINICAL DATA:  Abdominal pain. History of small-bowel obstruction and bowel resection. No right lower quadrant pain radiating to the back. EXAM: CT ABDOMEN AND PELVIS WITH CONTRAST TECHNIQUE: Multidetector CT  imaging of the abdomen and pelvis was performed using the standard protocol following bolus administration of intravenous contrast. RADIATION DOSE REDUCTION: This exam was performed according to the departmental dose-optimization program which includes automated exposure control, adjustment of the mA and/or kV according to patient size and/or use of iterative reconstruction technique. CONTRAST:  17m OMNIPAQUE IOHEXOL 300 MG/ML  SOLN COMPARISON:  09/23/2022 FINDINGS: Lower chest: Unremarkable. Hepatobiliary: No suspicious focal abnormality within the liver parenchyma. Mild prominence of the intrahepatic biliary ducts is similar to prior in this patient status post cholecystectomy. Pancreas: No focal mass lesion. No dilatation of the main duct. No intraparenchymal cyst. No peripancreatic edema. Spleen: No splenomegaly. No focal mass lesion. Adrenals/Urinary Tract: No adrenal nodule or mass. Kidneys unremarkable. No evidence for hydroureter. The urinary bladder appears normal for the degree of distention. Stomach/Bowel: Status post gastric bypass procedure. Duodenum is normally positioned as is the ligament of Treitz. No small bowel wall thickening. No small bowel dilatation. The terminal ileum is normal. The appendix is normal. Appendiceal diameter upper normal at 6 mm. The wall the appendix is somewhat ill-defined, but this is stable in appearance since the prior study. No overt periappendiceal edema or inflammation. No gross colonic mass. No colonic wall thickening. Vascular/Lymphatic: No abdominal aortic aneurysm. No abdominal aortic atherosclerotic calcification. There is no gastrohepatic or hepatoduodenal ligament lymphadenopathy. No retroperitoneal or mesenteric lymphadenopathy. No pelvic sidewall lymphadenopathy. Reproductive: Calcified uterine fibroids again noted. There is no adnexal mass. Other: No intraperitoneal free fluid. Musculoskeletal: No worrisome lytic or sclerotic osseous abnormality. IMPRESSION:  1. No definite acute findings in the abdomen or pelvis. 2. Appendix measures 6 mm diameter which is upper normal. While the wall the appendix is somewhat ill-defined, this feature is similar to the study from 09/23/2022 as is the trace wispy soft tissue attenuation in the ileocolic mesentery. No overt signs of appendicitis, but given that the wall is somewhat ill-defined, close follow-up suggested. 3. Status post gastric bypass procedure. 4. Calcified uterine fibroids. Electronically Signed   By: EMisty StanleyM.D.   On: 03/04/2023 18:26    Pertinent labs & imaging results that were available during my care of the patient were reviewed by me and considered in  my medical decision making (see MDM for details).  Medications Ordered in ED Medications  iohexol (OMNIPAQUE) 300 MG/ML solution 100 mL (100 mLs Intravenous Contrast Given 03/04/23 1804)  alum & mag hydroxide-simeth (MAALOX/MYLANTA) 200-200-20 MG/5ML suspension 30 mL (30 mLs Oral Given 03/04/23 1915)    And  lidocaine (XYLOCAINE) 2 % viscous mouth solution 15 mL (15 mLs Oral Given 03/04/23 1915)  famotidine (PEPCID) IVPB 20 mg premix (0 mg Intravenous Stopped 03/04/23 2013)  sodium chloride 0.9 % bolus 1,000 mL (0 mLs Intravenous Stopped 03/04/23 2013)  dicyclomine (BENTYL) capsule 10 mg (10 mg Oral Given 03/04/23 1926)  ketorolac (TORADOL) 30 MG/ML injection 30 mg (30 mg Intravenous Given 03/04/23 1921)                                                                                                                                     Procedures Procedures  (including critical care time)  Medical Decision Making / ED Course    Medical Decision Making:    Krista Fisher is a 47 y.o. female with past medical history as below, significant for GERD, HTN, prior SBO s/p resection who presents to the ED with complaint of epigastric abdominal pain.. The complaint involves an extensive differential diagnosis and also carries with it a high risk of  complications and morbidity.  Serious etiology was considered. Ddx includes but is not limited to: Differential diagnosis includes but is not exclusive to acute cholecystitis, intrathoracic causes for epigastric abdominal pain, gastritis, duodenitis, pancreatitis, small bowel or large bowel obstruction, abdominal aortic aneurysm, hernia, gastritis, etc.   Complete initial physical exam performed, notably the patient  was no acute distress, HDS, mildly TTP to epigastrium, not peritoneal.    Reviewed and confirmed nursing documentation for past medical history, family history, social history.  Vital signs reviewed.   Labs reviewed, mildly elevated liver enzymes, otherwise stable.  CT imaging reviewed, abnormal appendix but no appendicitis.  Fibroids.  Otherwise unremarkable.  Exam is reassuring, give GI cocktail, IV fluids, reassess.  Clinical Course as of 03/04/23 2206  Wed Mar 04, 2023  2203 Symptoms resolved following GI cocktail.  She is tolerating p.o. intake without difficulty.  Vital signs stable. [SG]    Clinical Course User Index [SG] Jeanell Sparrow, DO   Concern for gastritis is likely etiology of her symptoms today, abdominal pain.  Start Carafate, ppi, bland diet, follow-up with GI in 2 weeks if symptoms persist. No etoh or nsaids next 2 weeks  No melena or BRBPR, abdomen not peritoneal, CT stable.  The patient improved significantly and was discharged in stable condition. Detailed discussions were had with the patient regarding current findings, and need for close f/u with PCP or on call doctor. The patient has been instructed to return immediately if the symptoms worsen in any way for re-evaluation. Patient verbalized understanding and is in agreement with current care plan. All questions answered prior to  discharge.    Additional history obtained: -Additional history obtained from na -External records from outside source obtained and reviewed including: Chart review including  previous notes, labs, imaging, consultation notes including primary care documentation, prior labs and imaging, home medications   Lab Tests: -I ordered, reviewed, and interpreted labs.   The pertinent results include:   Labs Reviewed  CBC WITH DIFFERENTIAL/PLATELET - Abnormal; Notable for the following components:      Result Value   RBC 5.13 (*)    MCV 75.4 (*)    All other components within normal limits  COMPREHENSIVE METABOLIC PANEL - Abnormal; Notable for the following components:   AST 107 (*)    ALT 48 (*)    All other components within normal limits  URINALYSIS, ROUTINE W REFLEX MICROSCOPIC - Abnormal; Notable for the following components:   Protein, ur TRACE (*)    All other components within normal limits  LIPASE, BLOOD  PREGNANCY, URINE    Notable for somewhat elevated liver enzymes, bilirubin normal  EKG   EKG Interpretation  Date/Time:    Ventricular Rate:    PR Interval:    QRS Duration:   QT Interval:    QTC Calculation:   R Axis:     Text Interpretation:           Imaging Studies ordered: I ordered imaging studies including CT abdomen I independently visualized the following imaging with scope of interpretation limited to determining acute life threatening conditions related to emergency care: Fibroids, abnormal appendix but appears chronic, otherwise normal I independently visualized and interpreted imaging. I agree with the radiologist interpretation   Medicines ordered and prescription drug management: Meds ordered this encounter  Medications   iohexol (OMNIPAQUE) 300 MG/ML solution 100 mL   AND Linked Order Group    alum & mag hydroxide-simeth (MAALOX/MYLANTA) 200-200-20 MG/5ML suspension 30 mL    lidocaine (XYLOCAINE) 2 % viscous mouth solution 15 mL   famotidine (PEPCID) IVPB 20 mg premix   sodium chloride 0.9 % bolus 1,000 mL   dicyclomine (BENTYL) capsule 10 mg   ketorolac (TORADOL) 30 MG/ML injection 30 mg   sucralfate (CARAFATE) 1  g tablet    Sig: Take 1 tablet (1 g total) by mouth with breakfast, with lunch, and with evening meal for 7 days.    Dispense:  21 tablet    Refill:  0    -I have reviewed the patients home medicines and have made adjustments as needed   Consultations Obtained: na   Cardiac Monitoring: The patient was maintained on a cardiac monitor.  I personally viewed and interpreted the cardiac monitored which showed an underlying rhythm of: nsr  Social Determinants of Health:  Diagnosis or treatment significantly limited by social determinants of health: obesity   Reevaluation: After the interventions noted above, I reevaluated the patient and found that they have resolved  Co morbidities that complicate the patient evaluation  Past Medical History:  Diagnosis Date   GERD (gastroesophageal reflux disease)    Hypertension       Dispostion: Disposition decision including need for hospitalization was considered, and patient discharged from emergency department.    Final Clinical Impression(s) / ED Diagnoses Final diagnoses:  Epigastric pain  Gastritis without bleeding, unspecified chronicity, unspecified gastritis type     This chart was dictated using voice recognition software.  Despite best efforts to proofread,  errors can occur which can change the documentation meaning.    Jeanell Sparrow, DO 03/04/23 2206

## 2023-03-04 NOTE — ED Notes (Signed)
PO challenge passed 

## 2023-03-04 NOTE — ED Notes (Signed)
Call to lab to run pregnancy test to facilitate CT

## 2023-03-04 NOTE — Discharge Instructions (Addendum)
It was a pleasure caring for you today in the emergency department. ° °Please return to the emergency department for any worsening or worrisome symptoms. ° ° °

## 2023-03-04 NOTE — ED Notes (Signed)
Call to CT to advise that preg is neg and IV is in place

## 2023-03-04 NOTE — ED Triage Notes (Signed)
Pt with hx of SBO and bowel resection comes to [place] from work via ems due to RLQ abdominal pain with radiation to back.  Pt was having pain 7/10 which has decreased to 4/10 by arrival to this facility.  Pt has LBM this am which was normal.

## 2023-03-05 ENCOUNTER — Other Ambulatory Visit (HOSPITAL_COMMUNITY): Payer: Self-pay

## 2023-03-10 ENCOUNTER — Encounter: Payer: Self-pay | Admitting: Internal Medicine

## 2023-03-10 ENCOUNTER — Encounter: Payer: Self-pay | Admitting: Nurse Practitioner

## 2023-03-10 ENCOUNTER — Ambulatory Visit: Payer: No Typology Code available for payment source | Admitting: Nurse Practitioner

## 2023-03-10 ENCOUNTER — Ambulatory Visit: Payer: No Typology Code available for payment source | Admitting: Internal Medicine

## 2023-03-10 VITALS — BP 100/70 | HR 64 | Ht 65.0 in | Wt 282.8 lb

## 2023-03-10 VITALS — BP 119/72 | HR 65 | Resp 18 | Ht 66.0 in | Wt 285.4 lb

## 2023-03-10 DIAGNOSIS — I10 Essential (primary) hypertension: Secondary | ICD-10-CM

## 2023-03-10 DIAGNOSIS — R1013 Epigastric pain: Secondary | ICD-10-CM

## 2023-03-10 DIAGNOSIS — K219 Gastro-esophageal reflux disease without esophagitis: Secondary | ICD-10-CM | POA: Diagnosis not present

## 2023-03-10 DIAGNOSIS — R9431 Abnormal electrocardiogram [ECG] [EKG]: Secondary | ICD-10-CM

## 2023-03-10 DIAGNOSIS — I440 Atrioventricular block, first degree: Secondary | ICD-10-CM

## 2023-03-10 NOTE — Progress Notes (Unsigned)
Primary Physician/Referring:  Billie Ruddy, MD  Patient ID: Krista Fisher, female    DOB: September 18, 1976, 47 y.o.   MRN: [id number]  Chief Complaint  Patient presents with   New Patient (Initial Visit)    1st degree AV block   HPI:    Krista Fisher  is a 47 y.o. female with past medical history significant for GERD and hypertension who is here to establish care with cardiology due to an abnormal EKG. Patient has not had any chest pain or palpitations.  She denies shortness of breath with activity even maximal exertion.  There is no family history of early heart disease in her family.  She denies any cardiac history in herself.  She does complain of feeling very tired and fatigued lately which is very new for her.  She is currently on iron supplements but they are not helping with her fatigue.  She denies diaphoresis, syncope, edema, orthopnea, PND, claudication.  Past Medical History:  Diagnosis Date   GERD (gastroesophageal reflux disease)    Hypertension    Past Surgical History:  Procedure Laterality Date   CESAREAN SECTION     CHOLECYSTECTOMY     GASTRECTOMY     IR ANGIOGRAM PELVIS SELECTIVE OR SUPRASELECTIVE  09/06/2020   IR ANGIOGRAM SELECTIVE EACH ADDITIONAL VESSEL  09/06/2020   IR ANGIOGRAM SELECTIVE EACH ADDITIONAL VESSEL  09/06/2020   IR EMBO TUMOR ORGAN ISCHEMIA INFARCT INC GUIDE ROADMAPPING  09/06/2020   IR RADIOLOGIST EVAL & MGMT  07/11/2020   IR RADIOLOGIST EVAL & MGMT  09/19/2020   IR RADIOLOGIST EVAL & MGMT  05/08/2021   IR US GUIDE VASC ACCESS LEFT  09/06/2020   KNEE SURGERY     LAPAROSCOPIC GASTRIC RESTRICTIVE DUODENAL PROCEDURE (DUODENAL SWITCH)     LAPAROSCOPY N/A 08/08/2022   Procedure: LAPAROSCOPY DIAGNOSTIC;  Surgeon: Felicie Morn, MD;  Location: [facility];  Service: General;  Laterality: N/A;   LAPAROTOMY N/A 08/08/2022   Procedure: EXPLORATORY LAPAROTOMY;  Surgeon: Felicie Morn, MD;  Location: [facility];  Service: General;  Laterality: N/A;    WRIST FRACTURE SURGERY     Family History  Problem Relation Age of Onset   Hyperthyroidism Mother    Learning disabilities Father    Hypertension Father    Hyperlipidemia Father    Heart disease Father    Heart failure Father    Diabetes Father    Multiple sclerosis Sister    Depression Paternal Uncle    Drug abuse Paternal Uncle    Early death Paternal Uncle    Diabetes Maternal Grandmother    Intellectual disability Maternal Grandmother    Hypertension Maternal Grandmother    Hyperlipidemia Maternal Grandmother    Heart disease Maternal Grandmother    Diabetes Paternal Grandmother    Learning disabilities Son    Depression Son    Colon cancer Neg Hx    Stomach cancer Neg Hx    Esophageal cancer Neg Hx    Pancreatic cancer Neg Hx     Social History   Tobacco Use   Smoking status: Never   Smokeless tobacco: Never  Substance Use Topics   Alcohol use: No   Marital Status: Married  ROS  Review of Systems  Constitutional: Positive for malaise/fatigue.   Objective  Blood pressure 119/72, pulse 65, resp. rate 18, height 5\' 6"  (1.676 m), weight 285 lb 6.4 oz (129.5 kg), SpO2 98 %. Body mass index is 46.06 kg/m.     03/10/2023    1:31  PM 03/10/2023    9:11 AM 03/04/2023   10:15 PM  Vitals with BMI  Height 5\' 6"  5\' 5"    Weight 285 lbs 6 oz 282 lbs 13 oz   BMI [id number]   Systolic 123456 [id number]  Diastolic 72 70 75  Pulse 65 64 65     Physical Exam Vitals reviewed.  HENT:     Head: Normocephalic and atraumatic.  Neck:     Vascular: No carotid bruit.  Cardiovascular:     Rate and Rhythm: Normal rate and regular rhythm.     Pulses: Normal pulses.     Heart sounds: Normal heart sounds. No murmur heard. Pulmonary:     Effort: Pulmonary effort is normal.     Breath sounds: Normal breath sounds.  Abdominal:     General: Bowel sounds are normal.  Musculoskeletal:     Right lower leg: No edema.     Left lower leg: No edema.  Skin:    General: Skin is warm and  dry.  Neurological:     Mental Status: She is alert.     Medications and allergies   Allergies  Allergen Reactions   Lamotrigine Other (See Comments)    Patient had headaches, abdominal cramping   Wellbutrin Xl [Bupropion] Other (See Comments)    Abdominal pain   Latex Rash and Other (See Comments)    Burning     Medication list after today's encounter   Current Outpatient Medications:    acetaminophen (TYLENOL) 500 MG tablet, Take 500 mg by mouth every 6 (six) hours as needed., Disp: , Rfl:    ALPRAZolam (XANAX) 0.5 MG tablet, Take 1 tablet by mouth daily as needed for severe anxiety/panic attack., Disp: 20 tablet, Rfl: 0   Calcium Carb-Cholecalciferol 600-10 MG-MCG TABS, Take 1 tablet by mouth daily., Disp: , Rfl:    cetirizine (ZYRTEC) 10 MG tablet, Take 10 mg by mouth daily., Disp: , Rfl:    COLLAGEN PO, Take 1 tablet by mouth daily. , Disp: , Rfl:    escitalopram (LEXAPRO) 20 MG tablet, Take 1 tablet (20 mg total) by mouth daily., Disp: 90 tablet, Rfl: 0   etonogestrel (NEXPLANON) 68 MG IMPL implant, 1 each by Subdermal route once., Disp: , Rfl:    ferrous sulfate 325 (65 FE) MG tablet, Take 325 mg by mouth daily with breakfast., Disp: , Rfl:    fluticasone (FLONASE) 50 MCG/ACT nasal spray, Place 1 spray into both nostrils daily., Disp: , Rfl:    Ginger, Zingiber officinalis, (GINGER PO), Take 1 tablet by mouth daily., Disp: , Rfl:    magnesium 30 MG tablet, Take 200 mg by mouth 2 (two) times daily., Disp: , Rfl:    Multiple Vitamins-Iron (MULTIVITAMINS WITH IRON) TABS tablet, Take 1 tablet by mouth daily., Disp: , Rfl:    omeprazole (PRILOSEC) 20 MG capsule, Take 1 capsule (20 mg total) by mouth daily., Disp: 30 capsule, Rfl: 3   sucralfate (CARAFATE) 1 g tablet, Take 1 tablet by mouth with breakfast, with lunch, and with evening meal for 7 days., Disp: 21 tablet, Rfl: 0   Turmeric (QC TUMERIC COMPLEX PO), Take 1 tablet by mouth daily., Disp: , Rfl:    Vitamin A (A-25 PO),  Take 1 tablet by mouth daily., Disp: , Rfl:    zinc gluconate 50 MG tablet, Take 50 mg by mouth daily., Disp: , Rfl:   Laboratory examination:   Lab Results  Component Value Date   NA 138 03/04/2023  K 4.3 03/04/2023   CO2 28 03/04/2023   GLUCOSE 99 03/04/2023   BUN 15 03/04/2023   CREATININE 0.85 03/04/2023   CALCIUM 9.7 03/04/2023   GFRNONAA >60 03/04/2023       Latest Ref Rng & Units 03/04/2023    3:55 PM 12/03/2022    2:35 PM 11/18/2022    1:23 PM  CMP  Glucose 70 - 99 mg/dL 99  97    BUN 6 - 20 mg/dL 15  14    Creatinine 0.44 - 1.00 mg/dL 0.85  0.84    Sodium 135 - 145 mmol/L 138  140    Potassium 3.5 - 5.1 mmol/L 4.3  4.5    Chloride 98 - 111 mmol/L 104  104    CO2 22 - 32 mmol/L 28  27    Calcium 8.9 - 10.3 mg/dL 9.7  9.7    Total Protein 6.5 - 8.1 g/dL 7.7  7.7  7.7   Total Bilirubin 0.3 - 1.2 mg/dL 0.4  0.3  0.4   Alkaline Phos 38 - 126 U/L 119  129  144   AST 15 - 41 U/L 107  29  39   ALT 0 - 44 U/L 48  21  29       Latest Ref Rng & Units 03/04/2023    3:55 PM 12/03/2022    2:35 PM 11/18/2022    1:23 PM  CBC  WBC 4.0 - 10.5 K/uL 6.4  7.0  7.2   Hemoglobin 12.0 - 15.0 g/dL 13.5  13.4  13.4   Hematocrit 36.0 - 46.0 % 38.7  39.3  39.8   Platelets 150 - 400 K/uL 226  248.0  258.0     Lipid Panel Recent Labs    08/09/22 0425 08/12/22 0228 08/14/22 0540  TRIG 32 58 70    HEMOGLOBIN A1C Lab Results  Component Value Date   HGBA1C 5.9 12/03/2022   MPG 96.8 08/08/2022   TSH Recent Labs    03/13/22 1144 09/12/22 1614 12/03/22 1435  TSH 2.59 2.80 2.54    External labs:   Labs [id number]: Folic acid [id number] TSH [id number] Free T4 = 0.63  HGB 13.4 ferritin 7 B6 level 13.1 MCV 75.9 RBCs 5.18 PTH 90 Vit D = 48 HGBa1c 5.9 BUN 14 Cr 0.82 K+ 4.5  Radiology:    Cardiac Studies:     EKG:   03/10/2023: Sinus Rhythm with first degree A-V block, rate 68 bpm. Low voltage in precordial leads. Nonspecific T-abnormality.  Assessment     ICD-10-CM   1.  1st degree AV block  I44.0 EKG 12-Lead    PCV ECHOCARDIOGRAM COMPLETE    2. Nonspecific abnormal electrocardiogram (ECG) (EKG)  R94.31 PCV ECHOCARDIOGRAM COMPLETE    3. Essential hypertension  I10        Orders Placed This Encounter  Procedures   EKG 12-Lead   PCV ECHOCARDIOGRAM COMPLETE    Standing Status:   Future    Standing Expiration Date:   03/09/2024    No orders of the defined types were placed in this encounter.   There are no discontinued medications.   Recommendations:   Milee Schoenfelder is a 47 y.o.  female with abnormal EKG  1st degree AV block Nonspecific abnormal electrocardiogram (ECG) (EKG) Will obtain echocardiogram since patient has been complaining of fatigue lately   Essential hypertension Very well controlled without medications Encourage low-sodium diet, less than 2000 mg daily. Follow-up PRN     Floydene Flock, DO,  [facility]  03/11/2023, 1:59 PM Office: [phone] Pager: [phone]

## 2023-03-10 NOTE — Patient Instructions (Signed)
_______________________________________________________  If your blood pressure at your visit was 140/90 or greater, please contact your primary care physician to follow up on this.  _______________________________________________________  If you are age 48 or older, your body mass index should be between 23-30. Your Body mass index is 47.06 kg/m. If this is out of the aforementioned range listed, please consider follow up with your Primary Care Provider.  If you are age 58 or younger, your body mass index should be between 19-25. Your Body mass index is 47.06 kg/m. If this is out of the aformentioned range listed, please consider follow up with your Primary Care Provider.   You have been scheduled for an endoscopy. Please follow written instructions given to you at your visit today. If you use inhalers (even only as needed), please bring them with you on the day of your procedure.  Continue Omeprazole once daily.  No NSAIDs   Complete Carafate prescription.   The [facility] providers would like to encourage you to use [facility] to communicate with providers for non-urgent requests or questions.  Due to long hold times on the telephone, sending your provider a message by [facility] may be a faster and more efficient way to get a response.  Please allow 48 business hours for a response.  Please remember that this is for non-urgent requests.  _______________________________________________________

## 2023-03-10 NOTE — Progress Notes (Signed)
Assessment:   47 yo female with the following  Epigastric/RUQ pain.    History of Duodenal switch in 2021. SBO in Aug 2023.  Internal hernia with strangulation of hepatobiliary limb of the duodenal switch anatomy.  She is status post exploratory laparotomy with reduction of the strangulated internal hernia and small bowel resection Aug 2023  GERD Symptoms controlled on daily PPI   Elevated liver enzymes.  Historically AST minimally elevated but with new trivial elevation of ALT earlier this month. No liver abnormalities on recent CT scan, just stable mild prominence of the intrahepatic biliary ducts similar to prior in this patient status post cholecystectomy.   Iron deficiency without anemia.  No overt GI blood loss.  On oral iron since duodenal switch in 2021  Colon cancer cancer screening Colonoscopy in [place] in 2018.  Plan:   -Complete one week course of Carafate prescribed by ED -Continue daily omeprazole -No NSAID use -Schedule for EGD. The risks and benefits of EGD with possible biopsies were discussed with the patient who agrees to proceed.  -Get copy of colonoscopy report from 2018.    History of Present Illness   Chief Complaint:  upper abdominal pain    Krista Fisher is a 47 y.o. year old female known to Dr Ardis Hughs with a past medical history of anal fissure, duodenal switch 2021,  SBO s/p resection Aug 2023, obesity,   GERD, cholecystectomy. See PMH / [place] for additional history.   Patient last seen  here in 2021  03/04/23 ED for epigastric pain / RUQ pain radiating around back .  Only one associated episode of N/V. Felt like when she had bowel obstruction in  Aug 2023.   In ED, MCV low at 75, hgb normal. WBC normal.  Lipase normal. UA unremarkable. AST 107 / ALT 48, liver chemistries o/w normal. No acute CT scan findings (see  below). Prescribed Carafate x 7 days  CT AP w contrast for abdominal pain / hx of SBO 1. No definite acute findings in the  abdomen or pelvis. 2. Appendix measures 6 mm diameter which is upper normal. While the wall the appendix is somewhat ill-defined, this feature is similar to the study from 09/23/2022 as is the trace wispy soft tissue attenuation in the ileocolic mesentery. No overt signs of appendicitis, but given that the wall is somewhat ill-defined, close follow-up suggested. 3. Status post gastric bypass procedure. 4. Calcified uterine fibroids. Mild prominence of the intrahepatic biliary ducts is similar to prior in this patient status post cholecystectomy   Interval HIstory :  No severe abdominal pain since ED visit but pain is still constant. Bending forward aggravates the epigastric pain. She feels like something is moving around in epigastrium.  Has associated early satieity without weight loss. Eating sometimes  exacerbates the pain. Avoiding fried and spicy food.   Taking Midol everyday but only for the last week. No NSAID use. No bowel changes   Previous Labs / Imaging::    Latest Ref Rng & Units 03/04/2023    3:55 PM 12/03/2022    2:35 PM 11/18/2022    1:23 PM  CBC  WBC 4.0 - 10.5 K/uL 6.4  7.0  7.2   Hemoglobin 12.0 - 15.0 g/dL 13.5  13.4  13.4   Hematocrit 36.0 - 46.0 % 38.7  39.3  39.8   Platelets 150 - 400 K/uL 226  248.0  258.0     Lab Results  Component Value Date   LIPASE  41 03/04/2023      Latest Ref Rng & Units 03/04/2023    3:55 PM 12/03/2022    2:35 PM 11/18/2022    1:23 PM  CMP  Glucose 70 - 99 mg/dL 99  97    BUN 6 - 20 mg/dL 15  14    Creatinine 0.44 - 1.00 mg/dL 0.85  0.84    Sodium 135 - 145 mmol/L 138  140    Potassium 3.5 - 5.1 mmol/L 4.3  4.5    Chloride 98 - 111 mmol/L 104  104    CO2 22 - 32 mmol/L 28  27    Calcium 8.9 - 10.3 mg/dL 9.7  9.7    Total Protein 6.5 - 8.1 g/dL 7.7  7.7  7.7   Total Bilirubin 0.3 - 1.2 mg/dL 0.4  0.3  0.4   Alkaline Phos 38 - 126 U/L 119  129  144   AST 15 - 41 U/L 107  29  39   ALT 0 - 44 U/L 48  21  29      Imaging:   CT ABDOMEN PELVIS W CONTRAST CLINICAL DATA:  Abdominal pain. History of small-bowel obstruction and bowel resection. No right lower quadrant pain radiating to the back.  EXAM: CT ABDOMEN AND PELVIS WITH CONTRAST  TECHNIQUE: Multidetector CT imaging of the abdomen and pelvis was performed using the standard protocol following bolus administration of intravenous contrast.  RADIATION DOSE REDUCTION: This exam was performed according to the departmental dose-optimization program which includes automated exposure control, adjustment of the mA and/or kV according to patient size and/or use of iterative reconstruction technique.  CONTRAST:  155mL OMNIPAQUE IOHEXOL 300 MG/ML  SOLN  COMPARISON:  09/23/2022  FINDINGS: Lower chest: Unremarkable.  Hepatobiliary: No suspicious focal abnormality within the liver parenchyma. Mild prominence of the intrahepatic biliary ducts is similar to prior in this patient status post cholecystectomy.  Pancreas: No focal mass lesion. No dilatation of the main duct. No intraparenchymal cyst. No peripancreatic edema.  Spleen: No splenomegaly. No focal mass lesion.  Adrenals/Urinary Tract: No adrenal nodule or mass. Kidneys unremarkable. No evidence for hydroureter. The urinary bladder appears normal for the degree of distention.  Stomach/Bowel: Status post gastric bypass procedure. Duodenum is normally positioned as is the ligament of Treitz. No small bowel wall thickening. No small bowel dilatation. The terminal ileum is normal. The appendix is normal. Appendiceal diameter upper normal at 6 mm. The wall the appendix is somewhat ill-defined, but this is stable in appearance since the prior study. No overt periappendiceal edema or inflammation. No gross colonic mass. No colonic wall thickening.  Vascular/Lymphatic: No abdominal aortic aneurysm. No abdominal aortic atherosclerotic calcification. There is no gastrohepatic or hepatoduodenal ligament  lymphadenopathy. No retroperitoneal or mesenteric lymphadenopathy. No pelvic sidewall lymphadenopathy.  Reproductive: Calcified uterine fibroids again noted. There is no adnexal mass.  Other: No intraperitoneal free fluid.  Musculoskeletal: No worrisome lytic or sclerotic osseous abnormality.  IMPRESSION: 1. No definite acute findings in the abdomen or pelvis. 2. Appendix measures 6 mm diameter which is upper normal. While the wall the appendix is somewhat ill-defined, this feature is similar to the study from 09/23/2022 as is the trace wispy soft tissue attenuation in the ileocolic mesentery. No overt signs of appendicitis, but given that the wall is somewhat ill-defined, close follow-up suggested. 3. Status post gastric bypass procedure. 4. Calcified uterine fibroids.  Electronically Signed   By: Misty Stanley M.D.   On: 03/04/2023 18:26  Past Medical History:  Diagnosis Date   GERD (gastroesophageal reflux disease)    Hypertension    Past Surgical History:  Procedure Laterality Date   CESAREAN SECTION     CHOLECYSTECTOMY     GASTRECTOMY     GASTRIC BYPASS     IR ANGIOGRAM PELVIS SELECTIVE OR SUPRASELECTIVE  09/06/2020   IR ANGIOGRAM SELECTIVE EACH ADDITIONAL VESSEL  09/06/2020   IR ANGIOGRAM SELECTIVE EACH ADDITIONAL VESSEL  09/06/2020   IR EMBO TUMOR ORGAN ISCHEMIA INFARCT INC GUIDE ROADMAPPING  09/06/2020   IR RADIOLOGIST EVAL & MGMT  07/11/2020   IR RADIOLOGIST EVAL & MGMT  09/19/2020   IR RADIOLOGIST EVAL & MGMT  05/08/2021   IR US GUIDE VASC ACCESS LEFT  09/06/2020   KNEE SURGERY     LAPAROSCOPY N/A 08/08/2022   Procedure: LAPAROSCOPY DIAGNOSTIC;  Surgeon: Felicie Morn, MD;  Location: [facility];  Service: General;  Laterality: N/A;   LAPAROTOMY N/A 08/08/2022   Procedure: EXPLORATORY LAPAROTOMY;  Surgeon: Felicie Morn, MD;  Location: [facility];  Service: General;  Laterality: N/A;   WRIST FRACTURE SURGERY     Family History  Problem Relation Age of Onset    Learning disabilities Father    Hypertension Father    Hyperlipidemia Father    Heart disease Father    Heart failure Father    Diabetes Father    Learning disabilities Son    Depression Son    Depression Paternal Uncle    Drug abuse Paternal Uncle    Early death Paternal Uncle    Diabetes Maternal Grandmother    Intellectual disability Maternal Grandmother    Hypertension Maternal Grandmother    Hyperlipidemia Maternal Grandmother    Heart disease Maternal Grandmother    Diabetes Paternal Grandmother    Colon cancer Neg Hx    Stomach cancer Neg Hx    Esophageal cancer Neg Hx    Pancreatic cancer Neg Hx    Social History   Tobacco Use   Smoking status: Never   Smokeless tobacco: Never  Vaping Use   Vaping Use: Never used  Substance Use Topics   Alcohol use: No   Drug use: No   Current Outpatient Medications  Medication Sig Dispense Refill   acetaminophen (TYLENOL) 500 MG tablet Take 500 mg by mouth every 6 (six) hours as needed.     ALPRAZolam (XANAX) 0.5 MG tablet Take 1 tablet by mouth daily as needed for severe anxiety/panic attack. 20 tablet 0   Calcium Carb-Cholecalciferol 600-10 MG-MCG TABS Take 1 tablet by mouth daily.     cetirizine (ZYRTEC) 10 MG tablet Take 10 mg by mouth daily.     COLLAGEN PO Take 1 tablet by mouth daily.      escitalopram (LEXAPRO) 20 MG tablet Take 1 tablet (20 mg total) by mouth daily. 90 tablet 0   etonogestrel (NEXPLANON) 68 MG IMPL implant 1 each by Subdermal route once.     ferrous sulfate 325 (65 FE) MG tablet Take 325 mg by mouth daily with breakfast.     fluticasone (FLONASE) 50 MCG/ACT nasal spray Place 1 spray into both nostrils daily.     Ginger, Zingiber officinalis, (GINGER PO) Take 1 tablet by mouth daily.     magnesium 30 MG tablet Take 200 mg by mouth 2 (two) times daily.     Multiple Vitamins-Iron (MULTIVITAMINS WITH IRON) TABS tablet Take 1 tablet by mouth daily.     omeprazole (PRILOSEC) 20 MG capsule Take 1 capsule  (  20 mg total) by mouth daily. 30 capsule 3   sucralfate (CARAFATE) 1 g tablet Take 1 tablet by mouth with breakfast, with lunch, and with evening meal for 7 days. 21 tablet 0   Turmeric (QC TUMERIC COMPLEX PO) Take 1 tablet by mouth daily.     Vitamin A (A-25 PO) Take 1 tablet by mouth daily.     zinc gluconate 50 MG tablet Take 50 mg by mouth daily.     No current facility-administered medications for this visit.   Allergies  Allergen Reactions   Lamotrigine Other (See Comments)    Patient had headaches, abdominal cramping   Wellbutrin Xl [Bupropion] Other (See Comments)    Abdominal pain   Latex Rash and Other (See Comments)    Burning     Review of Systems: All  systems reviewed and negative except where noted in HPI.   Wt Readings from Last 3 Encounters:  12/10/22 269 lb 9.6 oz (122.3 kg)  12/03/22 269 lb (122 kg)  10/08/22 264 lb 3.2 oz (119.8 kg)    Physical Exam:  BP 100/70   Pulse 64   Ht 5\' 5"  (1.651 m)   Wt 282 lb 12.8 oz (128.3 kg)   BMI 47.06 kg/m  Constitutional:  Pleasant, obese female in no acute distress. Psychiatric:  Normal mood and affect. Behavior is normal. EENT: Pupils normal.  Conjunctivae are normal. No scleral icterus. Neck supple.  Cardiovascular: Normal rate, regular rhythm.  Pulmonary/chest: Effort normal and breath sounds normal. No wheezing, rales or rhonchi. Abdominal: Soft, nondistended, nontender. Bowel sounds active throughout. There are no masses palpable. No hepatomegaly. Neurological: Alert and oriented to person place and time. Skin: Skin is warm and dry. No rashes noted.  [name], NP  03/10/2023, 8:18 AM  Cc:  Billie Ruddy, MD

## 2023-03-11 ENCOUNTER — Encounter: Payer: Self-pay | Admitting: Gastroenterology

## 2023-03-11 NOTE — Progress Notes (Signed)
Attending Physician's Attestation   I have reviewed the chart.   I agree with the Advanced Practitioner's note, impression, and recommendations with any updates as below. Reasonable for endoscopic evaluation as outlined.  Will try to go as deep as we can to understand her anatomy.  If LFTs are rechecked in the next 4 to 8 weeks and still remain elevated, then additional hepatocellular workup will need to be considered and potentially repeating MRI/MRCP.   Justice Britain, MD [facility] Office # [phone]

## 2023-03-18 ENCOUNTER — Other Ambulatory Visit (HOSPITAL_BASED_OUTPATIENT_CLINIC_OR_DEPARTMENT_OTHER): Payer: Self-pay

## 2023-03-18 ENCOUNTER — Other Ambulatory Visit (HOSPITAL_COMMUNITY): Payer: Self-pay | Admitting: Psychiatry

## 2023-03-18 ENCOUNTER — Other Ambulatory Visit: Payer: Self-pay

## 2023-03-18 DIAGNOSIS — F410 Panic disorder [episodic paroxysmal anxiety] without agoraphobia: Secondary | ICD-10-CM

## 2023-03-20 ENCOUNTER — Other Ambulatory Visit (HOSPITAL_BASED_OUTPATIENT_CLINIC_OR_DEPARTMENT_OTHER): Payer: Self-pay

## 2023-03-20 ENCOUNTER — Encounter (HOSPITAL_BASED_OUTPATIENT_CLINIC_OR_DEPARTMENT_OTHER): Payer: Self-pay | Admitting: Pharmacist

## 2023-03-21 ENCOUNTER — Other Ambulatory Visit (HOSPITAL_BASED_OUTPATIENT_CLINIC_OR_DEPARTMENT_OTHER): Payer: Self-pay

## 2023-03-21 ENCOUNTER — Encounter (HOSPITAL_BASED_OUTPATIENT_CLINIC_OR_DEPARTMENT_OTHER): Payer: Self-pay | Admitting: Pharmacist

## 2023-03-27 ENCOUNTER — Encounter: Payer: Self-pay | Admitting: Gastroenterology

## 2023-03-27 ENCOUNTER — Ambulatory Visit: Payer: No Typology Code available for payment source | Admitting: Gastroenterology

## 2023-03-27 VITALS — BP 121/75 | HR 68 | Temp 97.3°F | Resp 12 | Ht 65.0 in | Wt 282.0 lb

## 2023-03-27 DIAGNOSIS — I10 Essential (primary) hypertension: Secondary | ICD-10-CM | POA: Diagnosis not present

## 2023-03-27 DIAGNOSIS — R1013 Epigastric pain: Secondary | ICD-10-CM

## 2023-03-27 DIAGNOSIS — K2970 Gastritis, unspecified, without bleeding: Secondary | ICD-10-CM

## 2023-03-27 DIAGNOSIS — K2951 Unspecified chronic gastritis with bleeding: Secondary | ICD-10-CM | POA: Diagnosis not present

## 2023-03-27 MED ORDER — SODIUM CHLORIDE 0.9 % IV SOLN: 500.0000 mL | Freq: Once | INTRAVENOUS | Status: AC

## 2023-03-27 NOTE — Progress Notes (Signed)
Pt's states no medical or surgical changes since previsit or office visit. 

## 2023-03-27 NOTE — Progress Notes (Signed)
Called to room to assist during endoscopic procedure.  Patient ID and intended procedure confirmed with present staff. Received instructions for my participation in the procedure from the performing physician.  

## 2023-03-27 NOTE — Op Note (Signed)
[facility] Patient Name: Krista Fisher Procedure Date: 03/27/2023 3:42 PM MRN: [id number] Endoscopist: Corliss ParishGabriel Mansouraty , MD, [phone] Age: 47 Referring MD:  Date of Birth: [birth year] Gender: Female Account #: [id number] Procedure:                Upper GI endoscopy Indications:              Epigastric abdominal pain, Abdominal pain in the                            right upper quadrant Medicines:                Monitored Anesthesia Care Procedure:                Pre-Anesthesia Assessment:                           - Prior to the procedure, a History and Physical                            was performed, and patient medications and                            allergies were reviewed. The patient's tolerance of                            previous anesthesia was also reviewed. The risks                            and benefits of the procedure and the sedation                            options and risks were discussed with the patient.                            All questions were answered, and informed consent                            was obtained. Prior Anticoagulants: The patient has                            taken no anticoagulant or antiplatelet agents. ASA                            Grade Assessment: III - A patient with severe                            systemic disease. After reviewing the risks and                            benefits, the patient was deemed in satisfactory                            condition to undergo the procedure.  After obtaining informed consent, the endoscope was                            passed under direct vision. Throughout the                            procedure, the patient's blood pressure, pulse, and                            oxygen saturations were monitored continuously. The                            Olympus Scope [id number] was introduced through the                            mouth, and advanced to  the second part of duodenum.                            The upper GI endoscopy was accomplished without                            difficulty. The patient tolerated the procedure. Scope In: Scope Out: Findings:                 No gross lesions were noted in the entire esophagus.                           The Z-line was irregular and was found 36 cm from                            the incisors.                           A 5 cm hiatal hernia was present.                           Evidence of a duodenal switch was found in the                            gastric body (with what appeared to be the gastric                            sleeve portion).                           Diffuse moderately erythematous mucosa without                            bleeding was found in the entire examined stomach.                            Biopsies were taken with a cold forceps for  histology and Helicobacter pylori testing.                           There was evidence of a patent duodenoenterostomy                            in the duodenal bulb. This was characterized by                            healthy appearing mucosa. This is consistent with a                            duodenal switch.                           Normal mucosa was found in the jejunum. Biopsies                            were taken with a cold forceps for histology to                            rule out enteropathy. Complications:            No immediate complications. Estimated Blood Loss:     Estimated blood loss was minimal. Impression:               - No gross lesions in the entire esophagus.                           - Z-line irregular, 36 cm from the incisors.                           - 5 cm hiatal hernia noted.                           - A duodenal switch gastric sleeve was found.                           - Erythematous mucosa in the stomach. Biopsied.                           - Patent  duodenoenterostomy, characterized by                            healthy appearing mucosa was found.                           - Normal mucosa was found in the jejunum. Biopsied. Recommendation:           - The patient will be observed post-procedure,                            until all discharge criteria are met.                           - Discharge patient to  home.                           - Patient has a contact number available for                            emergencies. The signs and symptoms of potential                            delayed complications were discussed with the                            patient. Return to normal activities tomorrow.                            Written discharge instructions were provided to the                            patient.                           - Continue present medications.                           - Await pathology results.                           - The findings and recommendations were discussed                            with the patient.                           - The findings and recommendations were discussed                            with the patient's family. Corliss ParishGabriel Mansouraty, MD 03/27/2023 4:17:13 PM

## 2023-03-27 NOTE — Patient Instructions (Addendum)
-   Continue present medications.  - Await pathology results. - The findings and recommendations were discussed  with the patient. -Handout on gastritis and hiatal hernia provided    YOU HAD AN ENDOSCOPIC PROCEDURE TODAY AT THE [facility]:   Refer to the procedure report that was given to you for any specific questions about what was found during the examination.  If the procedure report does not answer your questions, please call your gastroenterologist to clarify.  If you requested that your care partner not be given the details of your procedure findings, then the procedure report has been included in a sealed envelope for you to review at your convenience later.  YOU SHOULD EXPECT: Some feelings of bloating in the abdomen. Passage of more gas than usual.  Walking can help get rid of the air that was put into your GI tract during the procedure and reduce the bloating. If you had a lower endoscopy (such as a colonoscopy or flexible sigmoidoscopy) you may notice spotting of blood in your stool or on the toilet paper. If you underwent a bowel prep for your procedure, you may not have a normal bowel movement for a few days.  Please Note:  You might notice some irritation and congestion in your nose or some drainage.  This is from the oxygen used during your procedure.  There is no need for concern and it should clear up in a day or so.  SYMPTOMS TO REPORT IMMEDIATELY:  Following upper endoscopy (EGD)  Vomiting of blood or coffee ground material  New chest pain or pain under the shoulder blades  Painful or persistently difficult swallowing  New shortness of breath  Fever of 100F or higher  Black, tarry-looking stools  For urgent or emergent issues, a gastroenterologist can be reached at any hour by calling [phone]. Do not use MyChart messaging for urgent concerns.    DIET:  We do recommend a small meal at first, but then you may proceed to your regular diet.  Drink plenty  of fluids but you should avoid alcoholic beverages for 24 hours.  ACTIVITY:  You should plan to take it easy for the rest of today and you should NOT DRIVE or use heavy machinery until tomorrow (because of the sedation medicines used during the test).    FOLLOW UP: Our staff will call the number listed on your records the next business day following your procedure.  We will call around 7:15- 8:00 am to check on you and address any questions or concerns that you may have regarding the information given to you following your procedure. If we do not reach you, we will leave a message.     If any biopsies were taken you will be contacted by phone or by letter within the next 1-3 weeks.  Please call us at [phone] if you have not heard about the biopsies in 3 weeks.    SIGNATURES/CONFIDENTIALITY: You and/or your care partner have signed paperwork which will be entered into your electronic medical record.  These signatures attest to the fact that that the information above on your After Visit Summary has been reviewed and is understood.  Full responsibility of the confidentiality of this discharge information lies with you and/or your care-partner.

## 2023-03-27 NOTE — Progress Notes (Signed)
A and O x3. Report to RN. Tolerated MAC anesthesia well.Teeth unchanged after procedure. 

## 2023-03-27 NOTE — Progress Notes (Signed)
GASTROENTEROLOGY PROCEDURE H&P NOTE   Primary Care Physician: Deeann SaintBanks, Shannon R, MD  HPI: Krista Fisher is a 47 y.o. female who presents for EGD for evaluation of EGD for epigastric pain.  Past Medical History:  Diagnosis Date   GERD (gastroesophageal reflux disease)    Hypertension    Past Surgical History:  Procedure Laterality Date   CESAREAN SECTION     CHOLECYSTECTOMY     GASTRECTOMY     IR ANGIOGRAM PELVIS SELECTIVE OR SUPRASELECTIVE  09/06/2020   IR ANGIOGRAM SELECTIVE EACH ADDITIONAL VESSEL  09/06/2020   IR ANGIOGRAM SELECTIVE EACH ADDITIONAL VESSEL  09/06/2020   IR EMBO TUMOR ORGAN ISCHEMIA INFARCT INC GUIDE ROADMAPPING  09/06/2020   IR RADIOLOGIST EVAL & MGMT  07/11/2020   IR RADIOLOGIST EVAL & MGMT  09/19/2020   IR RADIOLOGIST EVAL & MGMT  05/08/2021   IR US GUIDE VASC ACCESS LEFT  09/06/2020   KNEE SURGERY     LAPAROSCOPIC GASTRIC RESTRICTIVE DUODENAL PROCEDURE (DUODENAL SWITCH)     LAPAROSCOPY N/A 08/08/2022   Procedure: LAPAROSCOPY DIAGNOSTIC;  Surgeon: Quentin OreStechschulte, Paul J, MD;  Location: [facility] OR;  Service: General;  Laterality: N/A;   LAPAROTOMY N/A 08/08/2022   Procedure: EXPLORATORY LAPAROTOMY;  Surgeon: Quentin OreStechschulte, Paul J, MD;  Location: [facility] OR;  Service: General;  Laterality: N/A;   WRIST FRACTURE SURGERY     Current Outpatient Medications  Medication Sig Dispense Refill   acetaminophen (TYLENOL) 500 MG tablet Take 500 mg by mouth every 6 (six) hours as needed.     Calcium Carb-Cholecalciferol 600-10 MG-MCG TABS Take 1 tablet by mouth daily.     cetirizine (ZYRTEC) 10 MG tablet Take 10 mg by mouth daily.     COLLAGEN PO Take 1 tablet by mouth daily.      cyanocobalamin (VITAMIN B12) 500 MCG tablet Take 1,000 mcg by mouth daily.     escitalopram (LEXAPRO) 20 MG tablet Take 1 tablet (20 mg total) by mouth daily. 90 tablet 0   etonogestrel (NEXPLANON) 68 MG IMPL implant 1 each by Subdermal route once.     ferrous sulfate 325 (65 FE) MG tablet Take 325  mg by mouth daily with breakfast.     fluticasone (FLONASE) 50 MCG/ACT nasal spray Place 1 spray into both nostrils daily.     Ginger, Zingiber officinalis, (GINGER PO) Take 1 tablet by mouth daily.     magnesium 30 MG tablet Take 200 mg by mouth 2 (two) times daily.     Multiple Vitamins-Iron (MULTIVITAMINS WITH IRON) TABS tablet Take 1 tablet by mouth daily.     omeprazole (PRILOSEC) 20 MG capsule Take 1 capsule (20 mg total) by mouth daily. 30 capsule 3   Turmeric (QC TUMERIC COMPLEX PO) Take 1 tablet by mouth daily.     Vitamin A (A-25 PO) Take 1 tablet by mouth daily.     zinc gluconate 50 MG tablet Take 50 mg by mouth daily.     ALPRAZolam (XANAX) 0.5 MG tablet Take 1 tablet by mouth daily as needed for severe anxiety/panic attack. 20 tablet 0   sucralfate (CARAFATE) 1 g tablet Take 1 tablet by mouth with breakfast, with lunch, and with evening meal for 7 days. 21 tablet 0   Current Facility-Administered Medications  Medication Dose Route Frequency Provider Last Rate Last Admin   0.9 %  sodium chloride infusion  500 mL Intravenous Once Mansouraty, Netty StarringGabriel Jr., MD        Current Outpatient Medications:    acetaminophen (  TYLENOL) 500 MG tablet, Take 500 mg by mouth every 6 (six) hours as needed., Disp: , Rfl:    Calcium Carb-Cholecalciferol 600-10 MG-MCG TABS, Take 1 tablet by mouth daily., Disp: , Rfl:    cetirizine (ZYRTEC) 10 MG tablet, Take 10 mg by mouth daily., Disp: , Rfl:    COLLAGEN PO, Take 1 tablet by mouth daily. , Disp: , Rfl:    cyanocobalamin (VITAMIN B12) 500 MCG tablet, Take 1,000 mcg by mouth daily., Disp: , Rfl:    escitalopram (LEXAPRO) 20 MG tablet, Take 1 tablet (20 mg total) by mouth daily., Disp: 90 tablet, Rfl: 0   etonogestrel (NEXPLANON) 68 MG IMPL implant, 1 each by Subdermal route once., Disp: , Rfl:    ferrous sulfate 325 (65 FE) MG tablet, Take 325 mg by mouth daily with breakfast., Disp: , Rfl:    fluticasone (FLONASE) 50 MCG/ACT nasal spray, Place 1  spray into both nostrils daily., Disp: , Rfl:    Ginger, Zingiber officinalis, (GINGER PO), Take 1 tablet by mouth daily., Disp: , Rfl:    magnesium 30 MG tablet, Take 200 mg by mouth 2 (two) times daily., Disp: , Rfl:    Multiple Vitamins-Iron (MULTIVITAMINS WITH IRON) TABS tablet, Take 1 tablet by mouth daily., Disp: , Rfl:    omeprazole (PRILOSEC) 20 MG capsule, Take 1 capsule (20 mg total) by mouth daily., Disp: 30 capsule, Rfl: 3   Turmeric (QC TUMERIC COMPLEX PO), Take 1 tablet by mouth daily., Disp: , Rfl:    Vitamin A (A-25 PO), Take 1 tablet by mouth daily., Disp: , Rfl:    zinc gluconate 50 MG tablet, Take 50 mg by mouth daily., Disp: , Rfl:    ALPRAZolam (XANAX) 0.5 MG tablet, Take 1 tablet by mouth daily as needed for severe anxiety/panic attack., Disp: 20 tablet, Rfl: 0   sucralfate (CARAFATE) 1 g tablet, Take 1 tablet by mouth with breakfast, with lunch, and with evening meal for 7 days., Disp: 21 tablet, Rfl: 0  Current Facility-Administered Medications:    0.9 %  sodium chloride infusion, 500 mL, Intravenous, Once, Mansouraty, Netty Starring., MD Allergies  Allergen Reactions   Lamotrigine Other (See Comments)    Patient had headaches, abdominal cramping   Protein Other (See Comments)   Wellbutrin Xl [Bupropion] Other (See Comments)    Abdominal pain   Latex Rash and Other (See Comments)    Burning   Family History  Problem Relation Age of Onset   Hyperthyroidism Mother    Learning disabilities Father    Hypertension Father    Hyperlipidemia Father    Heart disease Father    Heart failure Father    Diabetes Father    Multiple sclerosis Sister    Depression Paternal Uncle    Drug abuse Paternal Uncle    Early death Paternal Uncle    Diabetes Maternal Grandmother    Intellectual disability Maternal Grandmother    Hypertension Maternal Grandmother    Hyperlipidemia Maternal Grandmother    Heart disease Maternal Grandmother    Diabetes Paternal Grandmother     Learning disabilities Son    Depression Son    Colon cancer Neg Hx    Stomach cancer Neg Hx    Esophageal cancer Neg Hx    Pancreatic cancer Neg Hx    Social History   Socioeconomic History   Marital status: Married    Spouse name: Not on file   Number of children: 1   Years of education: Not on  file   Highest education level: Bachelor's degree (e.g., BA, AB, BS)  Occupational History   Not on file  Tobacco Use   Smoking status: Never   Smokeless tobacco: Never  Vaping Use   Vaping Use: Never used  Substance and Sexual Activity   Alcohol use: No   Drug use: No   Sexual activity: Yes  Other Topics Concern   Not on file  Social History Narrative   Not on file   Social Determinants of Health   Financial Resource Strain: Low Risk  (09/12/2022)   Overall Financial Resource Strain (CARDIA)    Difficulty of Paying Living Expenses: Not hard at all  Food Insecurity: No Food Insecurity (09/12/2022)   Hunger Vital Sign    Worried About Running Out of Food in the Last Year: Never true    Ran Out of Food in the Last Year: Never true  Transportation Needs: No Transportation Needs (09/12/2022)   PRAPARE - Administrator, Civil ServiceTransportation    Lack of Transportation (Medical): No    Lack of Transportation (Non-Medical): No  Physical Activity: Insufficiently Active (09/12/2022)   Exercise Vital Sign    Days of Exercise per Week: 4 days    Minutes of Exercise per Session: 30 min  Stress: Stress Concern Present (09/12/2022)   [employer] - Occupational Stress Questionnaire    Feeling of Stress : Very much  Social Connections: Moderately Integrated (09/12/2022)   Social Connection and Isolation Panel [NHANES]    Frequency of Communication with Friends and Family: More than three times a week    Frequency of Social Gatherings with Friends and Family: Once a week    Attends Religious Services: More than 4 times per year    Active Member of Clubs or Organizations: Yes    Attends  Engineer, structuralClub or Organization Meetings: More than 4 times per year    Marital Status: Separated  Intimate Partner Violence: Not on file    Physical Exam: Today's Vitals   03/27/23 1513  BP: 115/73  Pulse: 65  Temp: (!) 97.3 F (36.3 C)  TempSrc: Skin  SpO2: 98%  Weight: 282 lb (127.9 kg)  Height: 5\' 5"  (1.651 m)   Body mass index is 46.93 kg/m. GEN: NAD EYE: Sclerae anicteric ENT: MMM CV: Non-tachycardic GI: Soft, NT/ND NEURO:  Alert & Oriented x 3  Lab Results: No results for input(s): "WBC", "HGB", "HCT", "PLT" in the last 72 hours. BMET No results for input(s): "NA", "K", "CL", "CO2", "GLUCOSE", "BUN", "CREATININE", "CALCIUM" in the last 72 hours. LFT No results for input(s): "PROT", "ALBUMIN", "AST", "ALT", "ALKPHOS", "BILITOT", "BILIDIR", "IBILI" in the last 72 hours. PT/INR No results for input(s): "LABPROT", "INR" in the last 72 hours.   Impression / Plan: This is a 47 y.o.female who presents for EGD for evaluation of EGD for epigastric pain.  The risks and benefits of endoscopic evaluation/treatment were discussed with the patient and/or family; these include but are not limited to the risk of perforation, infection, bleeding, missed lesions, lack of diagnosis, severe illness requiring hospitalization, as well as anesthesia and sedation related illnesses.  The patient's history has been reviewed, patient examined, no change in status, and deemed stable for procedure.  The patient and/or family is agreeable to proceed.    Corliss ParishGabriel Mansouraty, MD [facility] Office # [phone]

## 2023-03-30 ENCOUNTER — Telehealth: Payer: Self-pay | Admitting: *Deleted

## 2023-03-30 NOTE — Telephone Encounter (Signed)
  Follow up Call-     03/27/2023    3:14 PM  Call back number  Post procedure Call Back phone  # [phone]  Permission to leave phone message Yes     Patient questions:  Do you have a fever, pain , or abdominal swelling? No. Pain Score  0 *  Have you tolerated food without any problems? Yes.    Have you been able to return to your normal activities? Yes.    Do you have any questions about your discharge instructions: Diet   No. Medications  No. Follow up visit  No.  Do you have questions or concerns about your Care? No.  Actions: * If pain score is 4 or above: No action needed, pain <4.

## 2023-04-02 ENCOUNTER — Encounter: Payer: Self-pay | Admitting: Internal Medicine

## 2023-04-03 ENCOUNTER — Other Ambulatory Visit (HOSPITAL_BASED_OUTPATIENT_CLINIC_OR_DEPARTMENT_OTHER): Payer: Self-pay

## 2023-04-03 ENCOUNTER — Ambulatory Visit: Payer: No Typology Code available for payment source

## 2023-04-03 DIAGNOSIS — R9431 Abnormal electrocardiogram [ECG] [EKG]: Secondary | ICD-10-CM

## 2023-04-03 DIAGNOSIS — I440 Atrioventricular block, first degree: Secondary | ICD-10-CM

## 2023-04-03 MED ORDER — TOPIRAMATE 25 MG PO TABS
25.0000 mg | ORAL_TABLET | Freq: Every day | ORAL | 0 refills | Status: DC
  Filled 2023-04-03: qty 30, 30d supply, fill #0

## 2023-04-08 ENCOUNTER — Other Ambulatory Visit (HOSPITAL_BASED_OUTPATIENT_CLINIC_OR_DEPARTMENT_OTHER): Payer: Self-pay

## 2023-04-08 ENCOUNTER — Other Ambulatory Visit: Payer: Self-pay

## 2023-04-08 DIAGNOSIS — K219 Gastro-esophageal reflux disease without esophagitis: Secondary | ICD-10-CM

## 2023-04-08 MED ORDER — OMEPRAZOLE 40 MG PO CPDR
40.0000 mg | DELAYED_RELEASE_CAPSULE | Freq: Every day | ORAL | 0 refills | Status: DC
  Filled 2023-04-08: qty 14, 14d supply, fill #0

## 2023-04-08 MED ORDER — OMEPRAZOLE 20 MG PO CPDR: 20.0000 mg | DELAYED_RELEASE_CAPSULE | Freq: Every day | ORAL | 3 refills | Status: DC

## 2023-04-08 NOTE — Telephone Encounter (Signed)
I am okay for her to trial 2 weeks of increased omeprazole up to 40 mg.  At the same time, a new prescription for Carafate to be used twice daily can be sent but I would let her try the PPI solely before adding the Carafate.  Thanks. GM

## 2023-04-09 ENCOUNTER — Encounter: Payer: Self-pay | Admitting: Gastroenterology

## 2023-04-16 ENCOUNTER — Other Ambulatory Visit (HOSPITAL_BASED_OUTPATIENT_CLINIC_OR_DEPARTMENT_OTHER): Payer: Self-pay

## 2023-04-16 MED ORDER — METFORMIN HCL 500 MG PO TABS
500.0000 mg | ORAL_TABLET | Freq: Every day | ORAL | 0 refills | Status: DC
  Filled 2023-04-16: qty 30, 30d supply, fill #0

## 2023-04-16 MED ORDER — TOPIRAMATE 25 MG PO TABS
50.0000 mg | ORAL_TABLET | Freq: Every day | ORAL | 0 refills | Status: DC
  Filled 2023-04-16: qty 60, 30d supply, fill #0

## 2023-04-24 ENCOUNTER — Other Ambulatory Visit (HOSPITAL_BASED_OUTPATIENT_CLINIC_OR_DEPARTMENT_OTHER): Payer: Self-pay

## 2023-04-24 MED ORDER — OMEPRAZOLE 40 MG PO CPDR
40.0000 mg | DELAYED_RELEASE_CAPSULE | Freq: Every morning | ORAL | 3 refills | Status: AC
  Filled 2023-04-24: qty 90, 90d supply, fill #0
  Filled 2023-07-20: qty 90, 90d supply, fill #1
  Filled 2023-10-09: qty 90, 90d supply, fill #2

## 2023-04-24 MED ORDER — ESCITALOPRAM OXALATE 20 MG PO TABS
20.0000 mg | ORAL_TABLET | Freq: Every day | ORAL | 3 refills | Status: AC
  Filled 2023-04-24: qty 90, 90d supply, fill #0
  Filled 2023-07-20: qty 90, 90d supply, fill #1
  Filled 2023-10-24: qty 90, 90d supply, fill #2

## 2023-05-14 ENCOUNTER — Other Ambulatory Visit (HOSPITAL_BASED_OUTPATIENT_CLINIC_OR_DEPARTMENT_OTHER): Payer: Self-pay

## 2023-05-14 MED ORDER — ALPRAZOLAM 0.5 MG PO TABS
0.5000 mg | ORAL_TABLET | Freq: Every day | ORAL | 0 refills | Status: DC | PRN
  Filled 2023-05-14: qty 30, 30d supply, fill #0

## 2023-05-19 ENCOUNTER — Other Ambulatory Visit (HOSPITAL_BASED_OUTPATIENT_CLINIC_OR_DEPARTMENT_OTHER): Payer: Self-pay

## 2023-05-19 MED ORDER — METFORMIN HCL 500 MG PO TABS
500.0000 mg | ORAL_TABLET | Freq: Two times a day (BID) | ORAL | 0 refills | Status: DC
  Filled 2023-05-19: qty 180, 90d supply, fill #0

## 2023-05-19 MED ORDER — PHENTERMINE HCL 37.5 MG PO TABS
18.7500 mg | ORAL_TABLET | Freq: Every day | ORAL | 0 refills | Status: DC
  Filled 2023-05-19: qty 15, 30d supply, fill #0

## 2023-05-20 ENCOUNTER — Other Ambulatory Visit: Payer: Self-pay

## 2023-05-20 ENCOUNTER — Other Ambulatory Visit (HOSPITAL_BASED_OUTPATIENT_CLINIC_OR_DEPARTMENT_OTHER): Payer: Self-pay

## 2023-06-05 ENCOUNTER — Other Ambulatory Visit (HOSPITAL_BASED_OUTPATIENT_CLINIC_OR_DEPARTMENT_OTHER): Payer: Self-pay

## 2023-06-05 MED ORDER — SUCRALFATE 1 G PO TABS
1.0000 g | ORAL_TABLET | Freq: Three times a day (TID) | ORAL | 0 refills | Status: DC
  Filled 2023-06-05: qty 21, 7d supply, fill #0

## 2023-06-11 ENCOUNTER — Other Ambulatory Visit (HOSPITAL_BASED_OUTPATIENT_CLINIC_OR_DEPARTMENT_OTHER): Payer: Self-pay

## 2023-06-11 MED ORDER — PHENTERMINE HCL 37.5 MG PO TABS
37.5000 mg | ORAL_TABLET | Freq: Every day | ORAL | 0 refills | Status: DC
  Filled 2023-06-11: qty 30, 30d supply, fill #0

## 2023-06-16 ENCOUNTER — Other Ambulatory Visit (HOSPITAL_BASED_OUTPATIENT_CLINIC_OR_DEPARTMENT_OTHER): Payer: Self-pay

## 2023-06-24 DIAGNOSIS — K859 Acute pancreatitis without necrosis or infection, unspecified: Secondary | ICD-10-CM | POA: Insufficient documentation

## 2023-07-20 ENCOUNTER — Other Ambulatory Visit (HOSPITAL_BASED_OUTPATIENT_CLINIC_OR_DEPARTMENT_OTHER): Payer: Self-pay

## 2023-07-21 ENCOUNTER — Other Ambulatory Visit (HOSPITAL_BASED_OUTPATIENT_CLINIC_OR_DEPARTMENT_OTHER): Payer: Self-pay

## 2023-07-22 ENCOUNTER — Emergency Department (HOSPITAL_COMMUNITY)
Admission: EM | Admit: 2023-07-22 | Discharge: 2023-07-23 | Disposition: A | Discharge status: Home / Self Care | Payer: Medicaid Other | Attending: Emergency Medicine | Admitting: Emergency Medicine

## 2023-07-22 ENCOUNTER — Other Ambulatory Visit: Payer: Self-pay

## 2023-07-22 DIAGNOSIS — R7401 Elevation of levels of liver transaminase levels: Secondary | ICD-10-CM | POA: Diagnosis not present

## 2023-07-22 DIAGNOSIS — I10 Essential (primary) hypertension: Secondary | ICD-10-CM | POA: Diagnosis not present

## 2023-07-22 DIAGNOSIS — Z91040 Latex allergy status: Secondary | ICD-10-CM | POA: Insufficient documentation

## 2023-07-22 DIAGNOSIS — R001 Bradycardia, unspecified: Secondary | ICD-10-CM | POA: Diagnosis not present

## 2023-07-22 DIAGNOSIS — Z9049 Acquired absence of other specified parts of digestive tract: Secondary | ICD-10-CM | POA: Diagnosis not present

## 2023-07-22 DIAGNOSIS — R1011 Right upper quadrant pain: Secondary | ICD-10-CM | POA: Insufficient documentation

## 2023-07-22 DIAGNOSIS — R7989 Other specified abnormal findings of blood chemistry: Secondary | ICD-10-CM | POA: Diagnosis not present

## 2023-07-22 DIAGNOSIS — K449 Diaphragmatic hernia without obstruction or gangrene: Secondary | ICD-10-CM | POA: Diagnosis not present

## 2023-07-22 DIAGNOSIS — R1033 Periumbilical pain: Secondary | ICD-10-CM | POA: Insufficient documentation

## 2023-07-22 DIAGNOSIS — Z79899 Other long term (current) drug therapy: Secondary | ICD-10-CM | POA: Diagnosis not present

## 2023-07-22 DIAGNOSIS — R1013 Epigastric pain: Secondary | ICD-10-CM | POA: Diagnosis not present

## 2023-07-22 DIAGNOSIS — N858 Other specified noninflammatory disorders of uterus: Secondary | ICD-10-CM | POA: Diagnosis not present

## 2023-07-22 DIAGNOSIS — R109 Unspecified abdominal pain: Secondary | ICD-10-CM | POA: Diagnosis present

## 2023-07-22 DIAGNOSIS — R110 Nausea: Secondary | ICD-10-CM | POA: Insufficient documentation

## 2023-07-22 DIAGNOSIS — R1010 Upper abdominal pain, unspecified: Secondary | ICD-10-CM

## 2023-07-22 DIAGNOSIS — K760 Fatty (change of) liver, not elsewhere classified: Secondary | ICD-10-CM | POA: Diagnosis not present

## 2023-07-22 DIAGNOSIS — R0689 Other abnormalities of breathing: Secondary | ICD-10-CM | POA: Diagnosis not present

## 2023-07-22 DIAGNOSIS — R1084 Generalized abdominal pain: Secondary | ICD-10-CM | POA: Diagnosis not present

## 2023-07-22 MED ORDER — ONDANSETRON 4 MG PO TBDP
4.0000 mg | ORAL_TABLET | Freq: Once | ORAL | Status: AC | PRN
  Administered 2023-07-22: 4 mg via ORAL
  Filled 2023-07-22: qty 1

## 2023-07-22 NOTE — ED Notes (Signed)
Patient states she took Xanax before arriving for anxiety.

## 2023-07-22 NOTE — ED Triage Notes (Signed)
Patient arrives via EMS for for sever 10/10 right upper abdominal pain that radiates to her back. Hx of bowel obstructions, hernia strangulation, and gall bladder removal. EMS gave of pain with no relief and Zofran. No vomiting just reports nausea. States pain began suddenly while sitting at home. Patient also reports feeling lightheaded upon arrival.

## 2023-07-23 ENCOUNTER — Emergency Department (HOSPITAL_COMMUNITY): Payer: Medicaid Other

## 2023-07-23 ENCOUNTER — Other Ambulatory Visit (HOSPITAL_BASED_OUTPATIENT_CLINIC_OR_DEPARTMENT_OTHER): Payer: Self-pay

## 2023-07-23 DIAGNOSIS — Z9049 Acquired absence of other specified parts of digestive tract: Secondary | ICD-10-CM | POA: Diagnosis not present

## 2023-07-23 DIAGNOSIS — K449 Diaphragmatic hernia without obstruction or gangrene: Secondary | ICD-10-CM | POA: Diagnosis not present

## 2023-07-23 DIAGNOSIS — K760 Fatty (change of) liver, not elsewhere classified: Secondary | ICD-10-CM | POA: Diagnosis not present

## 2023-07-23 DIAGNOSIS — R1011 Right upper quadrant pain: Secondary | ICD-10-CM | POA: Diagnosis not present

## 2023-07-23 DIAGNOSIS — N858 Other specified noninflammatory disorders of uterus: Secondary | ICD-10-CM | POA: Diagnosis not present

## 2023-07-23 MED ORDER — IOHEXOL 350 MG/ML SOLN
75.0000 mL | Freq: Once | INTRAVENOUS | Status: AC | PRN
  Administered 2023-07-23: 75 mL via INTRAVENOUS

## 2023-07-23 MED ORDER — ONDANSETRON 4 MG PO TBDP
4.0000 mg | ORAL_TABLET | Freq: Three times a day (TID) | ORAL | 0 refills | Status: AC | PRN
  Filled 2023-07-23: qty 20, 7d supply, fill #0

## 2023-07-23 MED ORDER — SODIUM CHLORIDE 0.9 % IV BOLUS
1000.0000 mL | Freq: Once | INTRAVENOUS | Status: AC
  Administered 2023-07-23: 1000 mL via INTRAVENOUS

## 2023-07-23 MED ORDER — MORPHINE SULFATE (PF) 4 MG/ML IV SOLN
4.0000 mg | Freq: Once | INTRAVENOUS | Status: AC
  Administered 2023-07-23: 4 mg via INTRAVENOUS
  Filled 2023-07-23: qty 1

## 2023-07-23 MED ORDER — HYDROMORPHONE HCL 1 MG/ML IJ SOLN
0.5000 mg | Freq: Once | INTRAMUSCULAR | Status: AC
  Administered 2023-07-23: 0.5 mg via INTRAVENOUS
  Filled 2023-07-23: qty 1

## 2023-07-23 MED ORDER — OXYCODONE HCL 5 MG PO TABS
5.0000 mg | ORAL_TABLET | Freq: Four times a day (QID) | ORAL | 0 refills | Status: DC | PRN
  Filled 2023-07-23: qty 6, 2d supply, fill #0

## 2023-07-23 NOTE — ED Provider Notes (Signed)
Physical Exam  BP 101/74   Pulse 67   Temp 98.2 F (36.8 C) (Oral)   Resp 18   Ht 5\' 6"  (1.676 m)   Wt 122.5 kg   SpO2 98%   BMI 43.58 kg/m   Physical Exam Vitals and nursing note reviewed.  Constitutional:      General: She is not in acute distress.    Appearance: She is well-developed.  HENT:     Head: Normocephalic and atraumatic.  Eyes:     Conjunctiva/sclera: Conjunctivae normal.  Cardiovascular:     Rate and Rhythm: Normal rate and regular rhythm.     Heart sounds: No murmur heard. Pulmonary:     Effort: Pulmonary effort is normal. No respiratory distress.     Breath sounds: Normal breath sounds.  Abdominal:     Palpations: Abdomen is soft.     Comments: Mild epigastric tenderness with deep palpation.  Musculoskeletal:        General: No swelling.     Cervical back: Neck supple.  Skin:    General: Skin is warm and dry.     Capillary Refill: Capillary refill takes less than 2 seconds.  Neurological:     Mental Status: She is alert.  Psychiatric:        Mood and Affect: Mood normal.     Procedures  Procedures  ED Course / MDM   Clinical Course as of 07/23/23 1318  Thu Jul 23, 2023  1610 Reevaluation of the patient showed significant improvement of symptoms.  Patient wanting to trial p.o. with discharge given reassuring imaging with follow-up with her surgeon in the outpatient setting.  Will trial p.o. and reassess. [CR]    Clinical Course User Index [CR] Peter Garter, PA   Medical Decision Making Amount and/or Complexity of Data Reviewed Labs: ordered. Radiology: ordered.  Risk Prescription drug management.   Patient care handed off from [facility] at shift change.  See prior note for full details.  In short, 47 year old female presents emergency department with epigastric/right upper quadrant pain that began late last night with associated nausea.  Patient with history of incarcerated hernia with bowel obstruction in August 2023 at  which she had exploratory laparotomy as well as history of gastric sleeve.  Awaiting ultrasound imaging of patient's right upper quadrant and reassessment of patient's symptoms thereafter.  Laboratory studies: No leukocytosis.  No evidence of anemia.  Placed within range.  Mild hypocalcemia of 8.8 but otherwise, electrolytes within normal limits.  Transaminitis with an AST of 211 and ALT of 75.  Renal function within normal limits.  Beta-hCG negative.  UA without abnormality.  Imaging studies: CT abdomen pelvis: No acute abnormalities. Right upper quadrant ultrasound: Absent gallbladder.  CBD of 9.5 mm most likely physiologic secondary to cholecystectomy.  Hepatic steatosis. I independently reviewed patient's imaging studies and was in agreement with radiologist interpretation.  Upper abdominal pain Vital signs within normal range and stable throughout visit Laboratory/imaging studies: See above 47 year old female presents emergency department with complaints of upper abdominal pain as well as nausea.  Patient's workup today overall reassuring.  CT imaging is without abnormality.  Right upper quadrant ultrasound without any acute change besides some CBD dilation most likely secondary to prior cholecystectomy and likely physiologic.  Patient's laboratory studies significant for transaminitis with an AST of 211, ALT of 75 as well as elevated lipase of 102.  Upon serial evaluation, patient noted continued improvement of symptoms.  Initially only very mild epigastric abdominal pain.  Subsequently, patient was trialed p.o. without worsening of pain, nausea or vomiting.  Discussion was had with patient regarding desire for surgical consultation for them to evaluate her in person given reported pain she was in when presenting but patient declined in favor of following up with them in the outpatient setting.  Will send patient home with antiemetic and recommend very close follow-up with her surgeon in the  outpatient setting.  Strict return precautions were discussed at length.  Treatment plan discussed at length with patient and she acknowledged understand was agreeable to said plan.  Patient overall well-appearing, afebrile no acute distress. Worrisome signs and symptoms were discussed with the patient and the patient acknowledged understanding to return to emergency department if notice.  Patient stable upon discharge.     Peter Garter, [place] 07/23/23 1318    Linwood Dibbles, MD 07/24/23 [phone]

## 2023-07-23 NOTE — Discharge Instructions (Addendum)
As discussed, workup today overall reassuring.  You did have elevation of your liver enzymes as well as slightly increased pancreas enzyme.  Recommend Tylenol/Motrin for baseline pain with pain medication prescribed for breakthrough pain.  Will send a medication called Zofran to take as needed for nausea/vomiting.  Recommend liquid/pured diet for the next few days until you are able to tolerate more complex foods.  Recommend follow-up with your surgeon in the outpatient setting for reevaluation of your symptoms.  Please do not hesitate to return to emergency department if pain returns/become severe, fever, nausea, vomiting, etc. as we discussed.

## 2023-07-23 NOTE — ED Provider Notes (Signed)
[place] EMERGENCY DEPARTMENT AT [facility] Provider Note   CSN: [id number] Arrival date & time: 07/22/23  2332     History  Chief Complaint  Patient presents with   Abdominal Pain   Nausea    Krista Fisher is a 47 y.o. female with history of incarcerated hernia, bowel obstruction status post ex lap in August 2023 for reduction of strangulated internal hernia and small bowel resection, history of gastric sleeve..  She presents today with concern for sudden onset epigastric pain started today with associated nausea.  3 bowel movements today.  No fevers or chills.  No vomiting.  Patient as well as history patient history of hypertension with, GERD, first-degree AV block. No anticoagulation.  HPI     Home Medications Prior to Admission medications   Medication Sig Start Date End Date Taking? Authorizing Provider  omeprazole (PRILOSEC) 40 MG capsule Take 1 capsule (40 mg total) by mouth daily for 14 days. 04/08/23 04/23/23  Mansouraty, Netty Starring., MD  acetaminophen (TYLENOL) 500 MG tablet Take 500 mg by mouth every 6 (six) hours as needed.    [provider]  ALPRAZolam Prudy Feeler) 0.5 MG tablet Take 1 tablet by mouth daily as needed for severe anxiety/panic attack. 11/05/22   Arfeen, Phillips Grout, MD  ALPRAZolam Prudy Feeler) 0.5 MG tablet Take 1 tablet (0.5 mg total) by mouth daily as needed. 05/14/23     Calcium Carb-Cholecalciferol 600-10 MG-MCG TABS Take 1 tablet by mouth daily. 01/25/20   [provider]  cetirizine (ZYRTEC) 10 MG tablet Take 10 mg by mouth daily.    [provider]  COLLAGEN PO Take 1 tablet by mouth daily.     [provider]  cyanocobalamin (VITAMIN B12) 500 MCG tablet Take 1,000 mcg by mouth daily.    [provider]  escitalopram (LEXAPRO) 20 MG tablet Take 1 tablet (20 mg total) by mouth daily. 11/05/22   Arfeen, Phillips Grout, MD  escitalopram (LEXAPRO) 20 MG tablet Take 1 tablet (20 mg total) by mouth daily. 04/24/23      etonogestrel (NEXPLANON) 68 MG IMPL implant 1 each by Subdermal route once. 06/06/20   [provider]  ferrous sulfate 325 (65 FE) MG tablet Take 325 mg by mouth daily with breakfast.    [provider]  fluticasone (FLONASE) 50 MCG/ACT nasal spray Place 1 spray into both nostrils daily.    [provider]  Ginger, Zingiber officinalis, (GINGER PO) Take 1 tablet by mouth daily.    [provider]  magnesium 30 MG tablet Take 200 mg by mouth 2 (two) times daily.    [provider]  metFORMIN (GLUCOPHAGE) 500 MG tablet Take 1 tablet (500 mg total) by mouth 2 (two) times daily with a meal. 05/19/23     Multiple Vitamins-Iron (MULTIVITAMINS WITH IRON) TABS tablet Take 1 tablet by mouth daily.    [provider]  omeprazole (PRILOSEC) 20 MG capsule Take 1 capsule (20 mg total) by mouth daily. 04/08/23   Mansouraty, Netty Starring., MD  omeprazole (PRILOSEC) 40 MG capsule Take 1 capsule (40 mg total) by mouth in the morning before meal. 04/24/23     phentermine (ADIPEX-P) 37.5 MG tablet Take 1 tablet (37.5 mg total) by mouth daily before breakfast. 06/10/23     sucralfate (CARAFATE) 1 g tablet Take 1 tablet (1 g total) by mouth 3 (three) times daily. 06/05/23     topiramate (TOPAMAX) 25 MG tablet Take 2 tablets (50 mg total) by mouth  daily. 04/16/23     Turmeric (QC TUMERIC COMPLEX PO) Take 1 tablet by mouth daily.    [provider]  Vitamin A (A-25 PO) Take 1 tablet by mouth daily.    [provider]  zinc gluconate 50 MG tablet Take 50 mg by mouth daily.    [provider]      Allergies    Lamotrigine, Protein, Wellbutrin xl [bupropion], and Latex    Review of Systems   Review of Systems  Constitutional:  Positive for appetite change. Negative for chills, diaphoresis, fatigue and fever.  HENT: Negative.    Respiratory: Negative.    Cardiovascular: Negative.   Gastrointestinal:  Positive for abdominal pain and nausea.  Negative for abdominal distention, anal bleeding, blood in stool, constipation, diarrhea and vomiting.  Genitourinary: Negative.   Musculoskeletal: Negative.   Neurological: Negative.     Physical Exam Updated Vital Signs BP (!) 98/56   Pulse 71   Temp 97.7 F (36.5 C) (Oral)   Resp 17   Ht 5\' 6"  (1.676 m)   Wt 122.5 kg   SpO2 93%   BMI 43.58 kg/m  Physical Exam Vitals and nursing note reviewed.  Constitutional:      Appearance: She is not ill-appearing or toxic-appearing.  HENT:     Head: Normocephalic and atraumatic.     Mouth/Throat:     Mouth: Mucous membranes are moist.     Pharynx: No oropharyngeal exudate or posterior oropharyngeal erythema.  Eyes:     General:        Right eye: No discharge.        Left eye: No discharge.     Conjunctiva/sclera: Conjunctivae normal.  Cardiovascular:     Rate and Rhythm: Normal rate and regular rhythm.     Pulses: Normal pulses.  Pulmonary:     Effort: Pulmonary effort is normal. No respiratory distress.     Breath sounds: Normal breath sounds. No wheezing or rales.  Abdominal:     General: Bowel sounds are normal. There is no distension.     Palpations: Abdomen is soft.     Tenderness: There is abdominal tenderness in the right upper quadrant, epigastric area and periumbilical area. There is no right CVA tenderness, left CVA tenderness, guarding or rebound.     Hernia: No hernia is present.    Musculoskeletal:        General: No deformity.     Cervical back: Neck supple.  Skin:    General: Skin is warm and dry.  Neurological:     Mental Status: She is alert. Mental status is at baseline.  Psychiatric:        Mood and Affect: Mood normal.     ED Results / Procedures / Treatments   Labs (all labs ordered are listed, but only abnormal results are displayed) Labs Reviewed  LIPASE, BLOOD - Abnormal; Notable for the following components:      Result Value   Lipase 102 (*)    All other components within normal limits   COMPREHENSIVE METABOLIC PANEL - Abnormal; Notable for the following components:   CO2 21 (*)    Calcium 8.8 (*)    Albumin 3.3 (*)    AST 211 (*)    ALT 75 (*)    All other components within normal limits  CBC - Abnormal; Notable for the following components:   MCV 77.2 (*)    MCH 25.9 (*)    All other components within normal limits  URINALYSIS, ROUTINE W REFLEX MICROSCOPIC  HCG, SERUM, QUALITATIVE    EKG None  Radiology CT ABDOMEN PELVIS W CONTRAST  Result Date: 07/23/2023 CLINICAL DATA:  Bowel obstruction suspected EXAM: CT ABDOMEN AND PELVIS WITH CONTRAST TECHNIQUE: Multidetector CT imaging of the abdomen and pelvis was performed using the standard protocol following bolus administration of intravenous contrast. RADIATION DOSE REDUCTION: This exam was performed according to the departmental dose-optimization program which includes automated exposure control, adjustment of the mA and/or kV according to patient size and/or use of iterative reconstruction technique. CONTRAST:  75mL OMNIPAQUE IOHEXOL 350 MG/ML SOLN COMPARISON:  03/04/2023 FINDINGS: Lower chest:  Small sliding hiatal hernia. Hepatobiliary: No focal liver abnormality.Biliary ductal prominence after cholecystectomy. Pancreas: Unremarkable. Spleen: Unremarkable. Adrenals/Urinary Tract: Negative adrenals. No hydronephrosis or stone. Unremarkable bladder. Stomach/Bowel: Postoperative stomach. No bowel obstruction or visible inflammation. Vascular/Lymphatic: No acute vascular abnormality. No new or worrisome lymph node. Reproductive:Peripherally calcified uterine mass compatible with fibroid, nearly 5 cm. Other: No ascites or pneumoperitoneum. Musculoskeletal: No acute abnormalities. Lumbar spine degeneration most notably affecting the facets of L4-5 and L5-S1. IMPRESSION: No new or acute finding. Negative for bowel obstruction or visible inflammation. Electronically Signed   By: Tiburcio Pea M.D.   On: 07/23/2023 04:30     Procedures Procedures    Medications Ordered in ED Medications  ondansetron (ZOFRAN-ODT) disintegrating tablet 4 mg (4 mg Oral Given 07/22/23 2353)  morphine (PF) 4 MG/ML injection 4 mg (4 mg Intravenous Given 07/23/23 0429)  sodium chloride 0.9 % bolus 1,000 mL (1,000 mLs Intravenous New Bag/Given 07/23/23 0427)  iohexol (OMNIPAQUE) 350 MG/ML injection 75 mL (75 mLs Intravenous Contrast Given 07/23/23 0419)  HYDROmorphone (DILAUDID) injection 0.5 mg (0.5 mg Intravenous Given 07/23/23 0606)    ED Course/ Medical Decision Making/ A&P                                Medical Decision Making 47 y/o female with hx of bowele obstruction secondary to internal hernia who presents with epigastric abdominal pain.   Mildly tachycardic and tachypneic on intake.  Tachycardia resolved at time of my evaluation.  Cardiopulmonary exams unremarkable, abdominal exam with epigastric and right upper quadrant tenderness palpation without CVA tenderness.  Surgical scar, well-healed.  The differential diagnosis for RUQ includes but is not limited to:  Cholelithiasis / choledocholithiasis / cholecystitis / cholangitis, hepatitis (eg. viral, alcoholic, toxic),liver abscess, pancreatitis, liver / pancreatic / biliary tract cancer, ischemic hepatopathy (shock liver), hepatic vein obstruction (Budd-Chiari syndrome), liver cell adenoma, peptic ulcer disease (duodenal), functional or nonulcer dyspepsia, right lower lobe pneumonia, pyelonephritis, urinary calculi,  Fitz-Hugh-Curtis syndrome (with pelvic inflammatory disease), herpes zoster, trauma or musculoskeletal pain, herniated disk, abdominal abscess, intestinal ischemia, physical or sexual abuse, ectopic pregnancy, IUP, Mittelschmerz, ovarian cyst/torsion, threatened/ievitable abortion, PID, endometriosis, molar pregnancy, heterotopic pregnancy, corpus luteum cyst, appendicitis, UTI/renal colic, IBD.    Amount and/or Complexity of Data Reviewed Labs: ordered.    Details:  CBC without leukocytosis or anemia, pregnancy test negative.  UA without evidence of infection.  CMP with mild elevation in transaminases with AST/ALT 211/75, mildly increased from patient's baseline 4 months ago.  Lipase elevated to 102, previously normal. Radiology: ordered.    Details:   CT of the abdomen pelvis obtained with contrast, no acute or new finding the abdomen, no bowel obstruction or visible inflammation ECG/medicine tests: independent interpretation performed.    Details: .  EKG with sinus rhythm with first-degree  AV block, no STEMI.  Risk Prescription drug management.   Care of this patient signed out to oncoming ED provider Sherian Maroon, PA-C at time of shift change. All pertinent HPI, physical exam, and laboratory findings were discussed with them prior to my departure. Disposition of patient pending completion of workup, reevaluation, and clinical judgement of oncoming ED provider.   Dispo pending [place] and reeval.   This chart was dictated using voice recognition software, Dragon. Despite the best efforts of this provider to proofread and correct errors, errors may still occur which can change documentation meaning.   Final Clinical Impression(s) / ED Diagnoses Final diagnoses:  None    Rx / DC Orders ED Discharge Orders     None         Sherrilee Gilles 07/23/23 [id number]    Glynn Octave, MD 07/23/23 [phone]

## 2023-07-31 ENCOUNTER — Ambulatory Visit: Payer: Medicaid Other | Admitting: Family Medicine

## 2023-07-31 ENCOUNTER — Encounter: Payer: Self-pay | Admitting: Family Medicine

## 2023-07-31 VITALS — BP 114/76 | HR 95 | Temp 98.6°F | Wt 281.2 lb

## 2023-07-31 DIAGNOSIS — R55 Syncope and collapse: Secondary | ICD-10-CM | POA: Diagnosis not present

## 2023-07-31 DIAGNOSIS — R7401 Elevation of levels of liver transaminase levels: Secondary | ICD-10-CM | POA: Diagnosis not present

## 2023-07-31 DIAGNOSIS — K760 Fatty (change of) liver, not elsewhere classified: Secondary | ICD-10-CM

## 2023-07-31 DIAGNOSIS — K449 Diaphragmatic hernia without obstruction or gangrene: Secondary | ICD-10-CM

## 2023-07-31 DIAGNOSIS — F4321 Adjustment disorder with depressed mood: Secondary | ICD-10-CM

## 2023-07-31 NOTE — Progress Notes (Signed)
Established Patient Office Visit   Subjective  Patient ID: Krista Fisher, female    DOB: 17-Jun-1976  Age: 47 y.o. MRN: [id number]  Chief Complaint  Patient presents with   Hospitalization Follow-up    Patient is a 47 year old female seen for ED follow-up.  Patient seen in ED on 07/22/2023 upper abdominal pain and transaminitis.  Patient states imaging in the ED largely negative with the exception of hepatic steatosis noted on RUQ u/s.  CT abdomen pelvis negative.  Patient states she was told she may have had pancreatitis.  Since ED visit patient states she is feeling slightly better but still has epigastric/RUQ pain that radiates to mid back.  Patient has appointment with general surgery, Dr. Renae Fickle next week to discuss hiatal hernia possibly becoming worse.  Patient states she is now having vasovagal symptoms daily including diaphoresis and increased pain with eating and BMs.  Had 4 syncopal episodes due to symptoms.  Patient report right wrist pain and pain in joints of right second digit.  Patient was taking 2 Tylenol every morning for the stiffness and discomfort.  Patient denies paresthesias.  Pt dealing with the death of her husband in 2023/05/29.  Patient's husband had metastatic pancreatic cancer.  Patient notes increased stress from family members.  Patient has counseling appointment next week.    Patient Active Problem List   Diagnosis Date Noted   Acute bilateral low back pain without sciatica 08/19/2022   Anxiety 08/19/2022   S/P hernia surgery 08/19/2022   S/P small bowel resection 08/19/2022   Hospital discharge follow-up 08/19/2022   Abdominal pain 08/08/2022   Fibroid 09/06/2020   Fibroids 09/06/2020   Diarrhea due to malabsorption 07/02/2020   Anal fissure 04/05/2020   Hemorrhoids 04/05/2020   Dysmenorrhea 02/07/2020   Polycystic ovary syndrome 02/07/2020   Status post biliopancreatic diversion with duodenal switch 01/17/2020   Excessive daytime sleepiness 01/17/2020    Hypersomnia with sleep apnea 01/17/2020   Loud snoring 01/17/2020   Morbid obesity with body mass index of 50 or higher (HCC) 01/17/2020   Arm numbness 03/08/2018   Osteoarthrosis involving lower leg 05/06/2011   EDEMA 02/04/2011   CHEST PAIN-UNSPECIFIED 02/04/2011   Vitamin D deficiency 12/03/2009   ALLERGIC RHINITIS 12/03/2009   Morbid obesity (HCC) 10/31/2009   DEPRESSION 10/31/2009   HYPERTENSION, BENIGN ESSENTIAL 10/31/2009   BLOCK, OTHER HEART 10/31/2009   URI 10/31/2009   GERD 10/31/2009   SLEEP APNEA 10/31/2009   PALPITATIONS 10/31/2009   1st degree AV block 10/31/2009   Major depressive disorder, single episode, unspecified 10/31/2009   Fatty liver disease, nonalcoholic 12/22/2008   Chronic post-traumatic stress disorder (PTSD) 04/05/1994   Past Medical History:  Diagnosis Date   GERD (gastroesophageal reflux disease)    Hypertension    Past Surgical History:  Procedure Laterality Date   CESAREAN SECTION     CHOLECYSTECTOMY     GASTRECTOMY     IR ANGIOGRAM PELVIS SELECTIVE OR SUPRASELECTIVE  09/06/2020   IR ANGIOGRAM SELECTIVE EACH ADDITIONAL VESSEL  09/06/2020   IR ANGIOGRAM SELECTIVE EACH ADDITIONAL VESSEL  09/06/2020   IR EMBO TUMOR ORGAN ISCHEMIA INFARCT INC GUIDE ROADMAPPING  09/06/2020   IR RADIOLOGIST EVAL & MGMT  07/11/2020   IR RADIOLOGIST EVAL & MGMT  09/19/2020   IR RADIOLOGIST EVAL & MGMT  28-May-2021   IR US GUIDE VASC ACCESS LEFT  09/06/2020   KNEE SURGERY     LAPAROSCOPIC GASTRIC RESTRICTIVE DUODENAL PROCEDURE (DUODENAL SWITCH)     LAPAROSCOPY N/A 08/08/2022  Procedure: LAPAROSCOPY DIAGNOSTIC;  Surgeon: Quentin Ore, MD;  Location: [facility] OR;  Service: General;  Laterality: N/A;   LAPAROTOMY N/A 08/08/2022   Procedure: EXPLORATORY LAPAROTOMY;  Surgeon: Quentin Ore, MD;  Location: [facility] OR;  Service: General;  Laterality: N/A;   WRIST FRACTURE SURGERY     Social History   Tobacco Use   Smoking status: Never   Smokeless tobacco:  Never  Vaping Use   Vaping status: Never Used  Substance Use Topics   Alcohol use: No   Drug use: No   Family History  Problem Relation Age of Onset   Hyperthyroidism Mother    Learning disabilities Father    Hypertension Father    Hyperlipidemia Father    Heart disease Father    Heart failure Father    Diabetes Father    Multiple sclerosis Sister    Depression Paternal Uncle    Drug abuse Paternal Uncle    Early death Paternal Uncle    Diabetes Maternal Grandmother    Intellectual disability Maternal Grandmother    Hypertension Maternal Grandmother    Hyperlipidemia Maternal Grandmother    Heart disease Maternal Grandmother    Diabetes Paternal Grandmother    Learning disabilities Son    Depression Son    Colon cancer Neg Hx    Stomach cancer Neg Hx    Esophageal cancer Neg Hx    Pancreatic cancer Neg Hx    Allergies  Allergen Reactions   Lamotrigine Other (See Comments)    Patient had headaches, abdominal cramping   Protein Other (See Comments)   Wellbutrin Xl [Bupropion] Other (See Comments)    Abdominal pain   Latex Rash and Other (See Comments)    Burning      ROS Negative unless stated above    Objective:     BP 114/76 (BP Location: Left Arm, Patient Position: Sitting, Cuff Size: Large)   Pulse 95   Temp 98.6 F (37 C) (Oral)   Wt 281 lb 3.2 oz (127.6 kg)   SpO2 96%   BMI 45.39 kg/m  BP Readings from Last 3 Encounters:  07/31/23 114/76  07/23/23 101/74  03/27/23 121/75   Wt Readings from Last 3 Encounters:  07/31/23 281 lb 3.2 oz (127.6 kg)  07/22/23 270 lb (122.5 kg)  03/27/23 282 lb (127.9 kg)      Physical Exam Constitutional:      General: She is not in acute distress.    Appearance: Normal appearance.  HENT:     Head: Normocephalic and atraumatic.     Nose: Nose normal.     Mouth/Throat:     Mouth: Mucous membranes are moist.  Eyes:     Extraocular Movements: Extraocular movements intact.     Conjunctiva/sclera:  Conjunctivae normal.  Cardiovascular:     Rate and Rhythm: Normal rate and regular rhythm.     Heart sounds: Normal heart sounds. No murmur heard.    No gallop.  Pulmonary:     Effort: Pulmonary effort is normal. No respiratory distress.     Breath sounds: Normal breath sounds. No wheezing, rhonchi or rales.  Abdominal:     General: Bowel sounds are normal.     Palpations: Abdomen is soft.     Tenderness: There is no abdominal tenderness. There is no guarding.  Skin:    General: Skin is warm and dry.  Neurological:     Mental Status: She is alert and oriented to person, place, and time. Mental status is  at baseline.  Psychiatric:        Attention and Perception: Attention normal.        Speech: Speech normal.        Behavior: Behavior normal.     Comments: Patient becomes tearful     No results found for any visits on 07/31/23.    Assessment & Plan:  Transaminitis -     Comprehensive metabolic panel; Future -     Lipase; Future  Hiatal hernia  Grief  Hepatic steatosis -     Comprehensive metabolic panel; Future  Vasovagal syncope  Patient seen for ED follow-up.  Imaging and labs from ED on 07/22/2023 reviewed.  Discussed possible causes for elevated LFTs and lipase.  Will recheck labs next week.  Patient encouraged to keep follow-up appointment with Dr. Renae Fickle, general surgery for hiatal hernia and ongoing vasovagal symptoms.  Also discussed follow-up with GI.  Continue lifestyle modifications.  Continue counseling.  Discussed the importance of self-care.  Return in about 3 months (around 10/31/2023), or if symptoms worsen or fail to improve.  Sooner if needed.  Deeann Saint, MD

## 2023-08-04 ENCOUNTER — Other Ambulatory Visit (HOSPITAL_BASED_OUTPATIENT_CLINIC_OR_DEPARTMENT_OTHER): Payer: Self-pay

## 2023-08-04 ENCOUNTER — Telehealth (HOSPITAL_COMMUNITY): Payer: Medicaid Other | Admitting: Psychiatry

## 2023-08-04 ENCOUNTER — Other Ambulatory Visit (HOSPITAL_COMMUNITY): Payer: Self-pay

## 2023-08-04 MED ORDER — ALPRAZOLAM 0.5 MG PO TABS
0.5000 mg | ORAL_TABLET | Freq: Every day | ORAL | 0 refills | Status: DC | PRN
  Filled 2023-08-04: qty 30, 30d supply, fill #0

## 2023-08-07 ENCOUNTER — Other Ambulatory Visit (HOSPITAL_BASED_OUTPATIENT_CLINIC_OR_DEPARTMENT_OTHER): Payer: Self-pay

## 2023-08-07 ENCOUNTER — Encounter (HOSPITAL_COMMUNITY): Payer: Self-pay | Admitting: Psychiatry

## 2023-08-07 ENCOUNTER — Telehealth (HOSPITAL_BASED_OUTPATIENT_CLINIC_OR_DEPARTMENT_OTHER): Payer: Medicaid Other | Admitting: Psychiatry

## 2023-08-07 VITALS — Wt 281.0 lb

## 2023-08-07 DIAGNOSIS — F331 Major depressive disorder, recurrent, moderate: Secondary | ICD-10-CM

## 2023-08-07 DIAGNOSIS — F410 Panic disorder [episodic paroxysmal anxiety] without agoraphobia: Secondary | ICD-10-CM | POA: Diagnosis not present

## 2023-08-07 DIAGNOSIS — K432 Incisional hernia without obstruction or gangrene: Secondary | ICD-10-CM | POA: Diagnosis not present

## 2023-08-07 DIAGNOSIS — R1084 Generalized abdominal pain: Secondary | ICD-10-CM | POA: Diagnosis not present

## 2023-08-07 DIAGNOSIS — F4312 Post-traumatic stress disorder, chronic: Secondary | ICD-10-CM

## 2023-08-07 MED ORDER — TRAZODONE HCL 50 MG PO TABS
50.0000 mg | ORAL_TABLET | Freq: Every evening | ORAL | 0 refills | Status: DC | PRN
Start: 2023-08-07 — End: 2023-11-11
  Filled 2023-08-07: qty 40, 20d supply, fill #0

## 2023-08-07 NOTE — Progress Notes (Signed)
Health MD Virtual Progress Note   Patient Location: Home Provider Location: Home Office  I connect with patient by video and verified that I am speaking with correct person by using two identifiers. I discussed the limitations of evaluation and management by telemedicine and the availability of in person appointments. I also discussed with the patient that there may be a patient responsible charge related to this service. The patient expressed understanding and agreed to proceed.  Krista Fisher [id number] 47 y.o.  08/07/2023 9:56 AM  History of Present Illness:  Patient is evaluated by video session.  She was last seen in November and she had missed appointment.  Patient to her ex died in 2023-06-06 due to complication of cancer.  Even though they have a lot of issues among themselves but she is thinking about him.  His family did not let her go to the funeral or to visit hospice.  Patient also complaining of a lot of abdominal pain and she feels abdominal pain and nausea.  She has appointment coming up this afternoon with the surgeon.  She noticed passing out.  She recently seen in the emergency room because of abdominal pain.  She is not sleeping well.  She has nightmares and waking up.  Her liver enzymes are high.  She is taking phentermine prescribed by weight loss program.  Her PCP Abbe Amsterdam renew her Lexapro.  Patient told her mother moved in with her in February because health issues and that is another stress she had.  She is not back to work since 2023-06-06.  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  Her PCP prescribe Xanax which she takes on and off.  Her biggest concern is lack of sleep.  She has no tremor or shakes or any EPS.  She has not able to find a therapist but now she feels she may need to see someone for chronic issues.  She is concerned about her general health.  She has nightmares, poor sleep, crying spells, panic  attacks.  Past Psychiatric History: H/O depression, anxiety and childhood trauma. Saw psychiatrist once but most of the time medicines were given by PCP. Prozac caused nightmares and Celexa caused increased anxiety.  Wellbutrin and Paxil help the most until stopped on her own.  No history of suicidal attempt.  No history of psychiatric inpatient treatment.         Outpatient Encounter Medications as of 08/07/2023  Medication Sig   acetaminophen (TYLENOL) 500 MG tablet Take 500 mg by mouth every 6 (six) hours as needed.   ALPRAZolam (XANAX) 0.5 MG tablet Take 1 tablet (0.5 mg total) by mouth daily as needed.   Calcium Carb-Cholecalciferol 600-10 MG-MCG TABS Take 1 tablet by mouth daily.   cetirizine (ZYRTEC) 10 MG tablet Take 10 mg by mouth daily.   COLLAGEN PO Take 1 tablet by mouth daily.    cyanocobalamin (VITAMIN B12) 500 MCG tablet Take 1,000 mcg by mouth daily.   escitalopram (LEXAPRO) 20 MG tablet Take 1 tablet (20 mg total) by mouth daily.   escitalopram (LEXAPRO) 20 MG tablet Take 1 tablet (20 mg total) by mouth daily.   etonogestrel (NEXPLANON) 68 MG IMPL implant 1 each by Subdermal route once.   ferrous sulfate 325 (65 FE) MG tablet Take 325 mg by mouth daily with breakfast.   fluticasone (FLONASE) 50 MCG/ACT nasal spray Place 1 spray into both nostrils daily.  Ginger, Zingiber officinalis, (GINGER PO) Take 1 tablet by mouth daily.   magnesium 30 MG tablet Take 200 mg by mouth 2 (two) times daily.   metFORMIN (GLUCOPHAGE) 500 MG tablet Take 1 tablet (500 mg total) by mouth 2 (two) times daily with a meal.   Multiple Vitamins-Iron (MULTIVITAMINS WITH IRON) TABS tablet Take 1 tablet by mouth daily.   omeprazole (PRILOSEC) 40 MG capsule Take 1 capsule (40 mg total) by mouth in the morning before meal.   ondansetron (ZOFRAN-ODT) 4 MG disintegrating tablet Take 1 tablet (4 mg total) by mouth every 8 (eight) hours as needed for nausea or vomiting.   oxyCODONE (ROXICODONE) 5 MG immediate  release tablet Take 1 tablet (5 mg total) by mouth every 6 (six) hours as needed for breakthrough pain.   phentermine (ADIPEX-P) 37.5 MG tablet Take 1 tablet (37.5 mg total) by mouth daily before breakfast.   sucralfate (CARAFATE) 1 g tablet Take 1 tablet (1 g total) by mouth 3 (three) times daily.   Turmeric (QC TUMERIC COMPLEX PO) Take 1 tablet by mouth daily.   Vitamin A (A-25 PO) Take 1 tablet by mouth daily.   zinc gluconate 50 MG tablet Take 50 mg by mouth daily.   Facility-Administered Encounter Medications as of 08/07/2023  Medication   0.9 %  sodium chloride infusion    Recent Results (from the past 2160 hour(s))  Lipase, blood     Status: Abnormal   Collection Time: 07/22/23 11:49 PM  Result Value Ref Range   Lipase 102 (H) 11 - 51 U/L    Comment: Performed at [facility], [address] [place], [place] [address]  Comprehensive metabolic panel     Status: Abnormal   Collection Time: 07/22/23 11:49 PM  Result Value Ref Range   Sodium 141 135 - 145 mmol/L   Potassium 3.8 3.5 - 5.1 mmol/L   Chloride 111 98 - 111 mmol/L   CO2 21 (L) 22 - 32 mmol/L   Glucose, Bld 94 70 - 99 mg/dL    Comment: Glucose reference range applies only to samples taken after fasting for at least 8 hours.   BUN 16 6 - 20 mg/dL   Creatinine, Ser 4.09 0.44 - 1.00 mg/dL   Calcium 8.8 (L) 8.9 - 10.3 mg/dL   Total Protein 6.8 6.5 - 8.1 g/dL   Albumin 3.3 (L) 3.5 - 5.0 g/dL   AST 811 (H) 15 - 41 U/L   ALT 75 (H) 0 - 44 U/L   Alkaline Phosphatase 121 38 - 126 U/L   Total Bilirubin 0.4 0.3 - 1.2 mg/dL   GFR, Estimated >91 >47 mL/min    Comment: (NOTE) Calculated using the CKD-EPI Creatinine Equation (2021)    Anion gap 9 5 - 15    Comment: Performed at [facility], [address], [place], [place] [address]  CBC     Status: Abnormal   Collection Time: 07/22/23 11:49 PM  Result Value Ref Range   WBC 8.3 4.0 - 10.5 K/uL   RBC 4.91 3.87 - 5.11 MIL/uL   Hemoglobin 12.7 12.0 - 15.0 g/dL    HCT 21.3 08.6 - 57.8 %   MCV 77.2 (L) 80.0 - 100.0 fL   MCH 25.9 (L) 26.0 - 34.0 pg   MCHC 33.5 30.0 - 36.0 g/dL   RDW 46.9 62.9 - 52.8 %   Platelets 228 150 - 400 K/uL   nRBC 0.0 0.0 - 0.2 %  Comment: Performed at [facility], [address] [place], [place] [address]  hCG, serum, qualitative     Status: None   Collection Time: 07/22/23 11:49 PM  Result Value Ref Range   Preg, Serum NEGATIVE NEGATIVE    Comment:        THE SENSITIVITY OF THIS METHODOLOGY IS >10 mIU/mL. Performed at [facility], [address], [place], [place] [address]   Urinalysis, Routine w reflex microscopic -Urine, Clean Catch     Status: None   Collection Time: 07/23/23  4:26 AM  Result Value Ref Range   Color, Urine YELLOW YELLOW   APPearance CLEAR CLEAR   Specific Gravity, Urine 1.019 1.005 - 1.030   pH 5.0 5.0 - 8.0   Glucose, UA NEGATIVE NEGATIVE mg/dL   Hgb urine dipstick NEGATIVE NEGATIVE   Bilirubin Urine NEGATIVE NEGATIVE   Ketones, ur NEGATIVE NEGATIVE mg/dL   Protein, ur NEGATIVE NEGATIVE mg/dL   Nitrite NEGATIVE NEGATIVE   Leukocytes,Ua NEGATIVE NEGATIVE    Comment: Performed at [facility], [address], [place], [place] [address]     Psychiatric Specialty Exam: Physical Exam  Review of Systems  Constitutional:  Positive for fatigue.  Gastrointestinal:  Positive for abdominal pain.  Psychiatric/Behavioral:  Positive for sleep disturbance. The patient is nervous/anxious.     Weight 281 lb (127.5 kg).There is no height or weight on file to calculate BMI.  General Appearance: Casual  Eye Contact:  Fair  Speech:  Slow  Volume:  Decreased  Mood:  Depressed and Dysphoric  Affect:  Constricted and Depressed  Thought Process:  Goal Directed  Orientation:  Full (Time, Place, and Person)  Thought Content:  Rumination  Suicidal Thoughts:  No  Homicidal Thoughts:  No  Memory:  Immediate;   Good Recent;   Good Remote;   Good  Judgement:  Fair  Insight:   Shallow  Psychomotor Activity:  Decreased  Concentration:  Concentration: Fair and Attention Span: Fair  Recall:  Good  Fund of Knowledge:  Good  Language:  Good  Akathisia:  No  Handed:  Right  AIMS (if indicated):     Assets:  Communication Skills Desire for Improvement Housing Talents/Skills Transportation  ADL's:  Intact  Cognition:  WNL  Sleep:  poor     Assessment/Plan: MDD (major depressive disorder), recurrent episode, moderate (HCC) - Plan: traZODone (DESYREL) 50 MG tablet  Panic attacks - Plan: traZODone (DESYREL) 50 MG tablet  Chronic post-traumatic stress disorder (PTSD) - Plan: traZODone (DESYREL) 50 MG tablet  I reviewed blood work results.  Liver enzymes are high.  She is having abdominal pain and is scheduled to see surgeon this afternoon.  Discussed loss of ex-husband.  She does not feel she need any grief counseling but like to see a therapist for coping skills.  Since husband died no more court date for [address].  Discussed keeping the appointment in the future for better management.  Her Lexapro was recently given by primary care.  She also taking Xanax which is prescribed by PCP.  I recommend to try low-dose trazodone 50-100 mg at bedtime however emphasis given about liver enzymes.  Recommend discussed weight loss program as taking phentermine which could be the reason for contributing high liver enzymes.  I encouraged to see GI for evaluation.  We will refer her for therapy.  Recommend to call us back if she is any question or any concern.  Follow-up in 4 weeks.  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   Follow Up Instructions:     I discussed the assessment and treatment plan with the patient. The patient was provided an opportunity to ask questions and all were answered. The patient agreed with the plan and demonstrated an understanding of the instructions.   The patient was advised to call back or seek an in-person  evaluation if the symptoms worsen or if the condition fails to improve as anticipated.    Collaboration of Care: Other provider involved in patient's care AEB notes are available in epic to review.  Patient/Guardian was advised Release of Information must be obtained prior to any record release in order to collaborate their care with an outside provider. Patient/Guardian was advised if they have not already done so to contact the registration department to sign all necessary forms in order for [place] to release information regarding their care.   Consent: Patient/Guardian gives verbal consent for treatment and assignment of benefits for services provided during this visit. Patient/Guardian expressed understanding and agreed to proceed.     I provided 31 minutes of non face to face time during this encounter.  Note: This document was prepared by [employer] voice dictation technology and any errors that results from this process are unintentional.    Cleotis Nipper, MD 08/07/2023

## 2023-08-18 ENCOUNTER — Other Ambulatory Visit (INDEPENDENT_AMBULATORY_CARE_PROVIDER_SITE_OTHER): Payer: Medicaid Other

## 2023-08-18 ENCOUNTER — Encounter: Payer: Self-pay | Admitting: Family Medicine

## 2023-08-18 DIAGNOSIS — R7401 Elevation of levels of liver transaminase levels: Secondary | ICD-10-CM | POA: Diagnosis not present

## 2023-08-18 DIAGNOSIS — K760 Fatty (change of) liver, not elsewhere classified: Secondary | ICD-10-CM

## 2023-08-18 LAB — COMPREHENSIVE METABOLIC PANEL
ALT: 33 U/L (ref 0–35)
AST: 73 U/L — ABNORMAL HIGH (ref 0–37)
Albumin: 3.8 g/dL (ref 3.5–5.2)
Alkaline Phosphatase: 132 U/L — ABNORMAL HIGH (ref 39–117)
BUN: 23 mg/dL (ref 6–23)
CO2: 28 mEq/L (ref 19–32)
Calcium: 9.2 mg/dL (ref 8.4–10.5)
Chloride: 106 mEq/L (ref 96–112)
Creatinine, Ser: 0.98 mg/dL (ref 0.40–1.20)
GFR: 68.64 mL/min (ref >60.00)
Glucose, Bld: 74 mg/dL (ref 70–99)
Potassium: 4.5 mEq/L (ref 3.5–5.1)
Sodium: 141 mEq/L (ref 135–145)
Total Bilirubin: 0.3 mg/dL (ref 0.2–1.2)
Total Protein: 7.2 g/dL (ref 6.0–8.3)

## 2023-08-18 LAB — LIPASE: Lipase: 32 U/L (ref 11.0–59.0)

## 2023-08-30 ENCOUNTER — Other Ambulatory Visit (HOSPITAL_COMMUNITY): Payer: Self-pay | Admitting: Psychiatry

## 2023-08-30 DIAGNOSIS — Z9884 Bariatric surgery status: Secondary | ICD-10-CM | POA: Diagnosis not present

## 2023-08-30 DIAGNOSIS — Z713 Dietary counseling and surveillance: Secondary | ICD-10-CM | POA: Diagnosis not present

## 2023-08-30 DIAGNOSIS — K912 Postsurgical malabsorption, not elsewhere classified: Secondary | ICD-10-CM | POA: Diagnosis not present

## 2023-08-31 ENCOUNTER — Other Ambulatory Visit (HOSPITAL_BASED_OUTPATIENT_CLINIC_OR_DEPARTMENT_OTHER): Payer: Self-pay

## 2023-08-31 DIAGNOSIS — K911 Postgastric surgery syndromes: Secondary | ICD-10-CM | POA: Diagnosis not present

## 2023-08-31 DIAGNOSIS — Z9884 Bariatric surgery status: Secondary | ICD-10-CM | POA: Diagnosis not present

## 2023-08-31 DIAGNOSIS — R1013 Epigastric pain: Secondary | ICD-10-CM | POA: Diagnosis not present

## 2023-08-31 MED ORDER — HYOSCYAMINE SULFATE 0.125 MG SL SUBL
0.1250 mg | SUBLINGUAL_TABLET | SUBLINGUAL | 0 refills | Status: AC | PRN
Start: 2023-08-31 — End: ?
  Filled 2023-08-31: qty 30, 5d supply, fill #0

## 2023-09-02 ENCOUNTER — Other Ambulatory Visit (HOSPITAL_BASED_OUTPATIENT_CLINIC_OR_DEPARTMENT_OTHER): Payer: Self-pay

## 2023-09-02 ENCOUNTER — Encounter (HOSPITAL_BASED_OUTPATIENT_CLINIC_OR_DEPARTMENT_OTHER): Payer: Self-pay | Admitting: Pharmacist

## 2023-09-03 ENCOUNTER — Encounter (HOSPITAL_COMMUNITY): Payer: Self-pay

## 2023-09-04 ENCOUNTER — Encounter (HOSPITAL_COMMUNITY): Payer: Self-pay | Admitting: *Deleted

## 2023-09-04 ENCOUNTER — Other Ambulatory Visit (HOSPITAL_BASED_OUTPATIENT_CLINIC_OR_DEPARTMENT_OTHER): Payer: Self-pay

## 2023-09-04 ENCOUNTER — Telehealth (HOSPITAL_COMMUNITY): Payer: Self-pay | Admitting: *Deleted

## 2023-09-04 NOTE — Telephone Encounter (Signed)
We tried trazodone to try for sleep.  If she needed Xanax then she need to contact the primary care who started the medication Xanax.

## 2023-09-04 NOTE — Telephone Encounter (Signed)
Please see encounter

## 2023-09-04 NOTE — Telephone Encounter (Signed)
Pt sent message requesting refill of Xanax 0.5 mg which was last called in by our office on 08/04/23 #30. Pt most recent visit was on 08/07/23 and it was noted Xanax had been written by pt's PCP as she had not been seen in quite some time. Does pt need to contact for refill or are we refilling? Looks like pt had an appointment scheduled for 09/16/23 that was cx possibly due to seeing you on 08/07/23. Please review and advise. Thanks.

## 2023-09-07 ENCOUNTER — Other Ambulatory Visit (HOSPITAL_COMMUNITY): Payer: Self-pay | Admitting: *Deleted

## 2023-09-07 DIAGNOSIS — Z9884 Bariatric surgery status: Secondary | ICD-10-CM | POA: Diagnosis not present

## 2023-09-07 DIAGNOSIS — K912 Postsurgical malabsorption, not elsewhere classified: Secondary | ICD-10-CM | POA: Diagnosis not present

## 2023-09-07 MED ORDER — ALPRAZOLAM 0.5 MG PO TABS
ORAL_TABLET | ORAL | 0 refills | Status: DC
  Filled 2023-09-09: qty 15, 15d supply, fill #0

## 2023-09-07 NOTE — Telephone Encounter (Signed)
I tried to call the patient but no one pick up the phone.  She has no upcoming appointment with this Clinical research associate.  She was given Xanax 0.5 mg on August 13.  She used to take Xanax as needed for panic attack.  I am happy to provide 15 more tablets.  She will need appointment for future refills.  Please call send and 0.5 mg to take as needed for panic attack to her pharmacy.

## 2023-09-09 ENCOUNTER — Other Ambulatory Visit (HOSPITAL_BASED_OUTPATIENT_CLINIC_OR_DEPARTMENT_OTHER): Payer: Self-pay

## 2023-09-16 ENCOUNTER — Ambulatory Visit (INDEPENDENT_AMBULATORY_CARE_PROVIDER_SITE_OTHER): Payer: No Typology Code available for payment source

## 2023-09-16 ENCOUNTER — Telehealth (HOSPITAL_COMMUNITY): Payer: Medicaid Other | Admitting: Psychiatry

## 2023-09-16 ENCOUNTER — Ambulatory Visit (INDEPENDENT_AMBULATORY_CARE_PROVIDER_SITE_OTHER): Payer: No Typology Code available for payment source | Admitting: Orthopaedic Surgery

## 2023-09-16 DIAGNOSIS — G8929 Other chronic pain: Secondary | ICD-10-CM

## 2023-09-16 DIAGNOSIS — M25561 Pain in right knee: Secondary | ICD-10-CM

## 2023-09-16 DIAGNOSIS — Z9889 Other specified postprocedural states: Secondary | ICD-10-CM

## 2023-09-16 NOTE — Progress Notes (Signed)
Chief Complaint: Bilateral knee pain     History of Present Illness:    Krista Fisher is a 47 y.o. female presents today with left worse than right knee pain.  She states that with regard to the left knee pain she does have a history of a left knee ACL reconstruction with BTB autograft done in 2002.  She has been doing well from that perspective although is having some persistent medial based knee pain which is worse for the last several years.  With regard to the right knee she is also having medial based knee pain on this side which is newer but causing pain going up and down steps and limiting her ability to exercise.  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 which is somewhat plateaued due to her new knee pain    Surgical History:   As above  PMH/PSH/Family History/Social History/Meds/Allergies:    Past Medical History:  Diagnosis Date   GERD (gastroesophageal reflux disease)    Hypertension    Past Surgical History:  Procedure Laterality Date   CESAREAN SECTION     CHOLECYSTECTOMY     GASTRECTOMY     IR ANGIOGRAM PELVIS SELECTIVE OR SUPRASELECTIVE  09/06/2020   IR ANGIOGRAM SELECTIVE EACH ADDITIONAL VESSEL  09/06/2020   IR ANGIOGRAM SELECTIVE EACH ADDITIONAL VESSEL  09/06/2020   IR EMBO TUMOR ORGAN ISCHEMIA INFARCT INC GUIDE ROADMAPPING  09/06/2020   IR RADIOLOGIST EVAL & MGMT  07/11/2020   IR RADIOLOGIST EVAL & MGMT  09/19/2020   IR RADIOLOGIST EVAL & MGMT  05/08/2021   IR US GUIDE VASC ACCESS LEFT  09/06/2020   KNEE SURGERY     LAPAROSCOPIC GASTRIC RESTRICTIVE DUODENAL PROCEDURE (DUODENAL SWITCH)     LAPAROSCOPY N/A 08/08/2022   Procedure: LAPAROSCOPY DIAGNOSTIC;  Surgeon: Quentin Ore, MD;  Location: [facility] OR;  Service: General;  Laterality: N/A;   LAPAROTOMY N/A 08/08/2022   Procedure: EXPLORATORY LAPAROTOMY;  Surgeon: Quentin Ore, MD;  Location: [facility] OR;  Service: General;  Laterality: N/A;   WRIST  FRACTURE SURGERY     Social History   Socioeconomic History   Marital status: Widowed    Spouse name: Not on file   Number of children: 1   Years of education: Not on file   Highest education level: Master's degree (e.g., MA, MS, MEng, MEd, MSW, MBA)  Occupational History   Not on file  Tobacco Use   Smoking status: Never   Smokeless tobacco: Never  Vaping Use   Vaping status: Never Used  Substance and Sexual Activity   Alcohol use: No   Drug use: No   Sexual activity: Yes  Other Topics Concern   Not on file  Social History Narrative   Not on file   Social Determinants of Health   Financial Resource Strain: Low Risk  (09/15/2023)   Overall Financial Resource Strain (CARDIA)    Difficulty of Paying Living Expenses: Not hard at all  Food Insecurity: No Food Insecurity (09/15/2023)   Hunger Vital Sign    Worried About Running Out of Food in the Last Year: Never true    Ran Out of Food in the Last Year: Never true  Transportation Needs: No Transportation Needs (09/15/2023)   PRAPARE - Administrator, Civil Service (Medical): No  Lack of Transportation (Non-Medical): No  Physical Activity: Sufficiently Active (09/15/2023)   Exercise Vital Sign    Days of Exercise per Week: 7 days    Minutes of Exercise per Session: 60 min  Stress: Stress Concern Present (09/15/2023)   [employer] of Occupational Health - Occupational Stress Questionnaire    Feeling of Stress : Very much  Social Connections: Moderately Integrated (09/15/2023)   Social Connection and Isolation Panel [NHANES]    Frequency of Communication with Friends and Family: More than three times a week    Frequency of Social Gatherings with Friends and Family: Once a week    Attends Religious Services: More than 4 times per year    Active Member of [employer] or Organizations: Yes    Attends Banker Meetings: More than 4 times per year    Marital Status: Widowed   Family History  Problem  Relation Age of Onset   Hyperthyroidism Mother    Learning disabilities Father    Hypertension Father    Hyperlipidemia Father    Heart disease Father    Heart failure Father    Diabetes Father    Multiple sclerosis Sister    Depression Paternal Uncle    Drug abuse Paternal Uncle    Early death Paternal Uncle    Diabetes Maternal Grandmother    Intellectual disability Maternal Grandmother    Hypertension Maternal Grandmother    Hyperlipidemia Maternal Grandmother    Heart disease Maternal Grandmother    Diabetes Paternal Grandmother    Learning disabilities Son    Depression Son    Colon cancer Neg Hx    Stomach cancer Neg Hx    Esophageal cancer Neg Hx    Pancreatic cancer Neg Hx    Allergies  Allergen Reactions   Lamotrigine Other (See Comments)    Patient had headaches, abdominal cramping   Protein Other (See Comments)   Wellbutrin Xl [Bupropion] Other (See Comments)    Abdominal pain   Latex Rash and Other (See Comments)    Burning   Current Outpatient Medications  Medication Sig Dispense Refill   acetaminophen (TYLENOL) 500 MG tablet Take 500 mg by mouth every 6 (six) hours as needed.     ALPRAZolam (XANAX) 0.5 MG tablet Take 1 tablet (0.5 mg total dose) daily as needed for panic attacks. 15 tablet 0   Calcium Carb-Cholecalciferol 600-10 MG-MCG TABS Take 1 tablet by mouth daily.     cetirizine (ZYRTEC) 10 MG tablet Take 10 mg by mouth daily.     COLLAGEN PO Take 1 tablet by mouth daily.      cyanocobalamin (VITAMIN B12) 500 MCG tablet Take 1,000 mcg by mouth daily.     escitalopram (LEXAPRO) 20 MG tablet Take 1 tablet (20 mg total) by mouth daily. 90 tablet 0   escitalopram (LEXAPRO) 20 MG tablet Take 1 tablet (20 mg total) by mouth daily. 90 tablet 3   etonogestrel (NEXPLANON) 68 MG IMPL implant 1 each by Subdermal route once.     ferrous sulfate 325 (65 FE) MG tablet Take 325 mg by mouth daily with breakfast.     fluticasone (FLONASE) 50 MCG/ACT nasal spray Place  1 spray into both nostrils daily.     Ginger, Zingiber officinalis, (GINGER PO) Take 1 tablet by mouth daily.     hyoscyamine (LEVSIN SL) 0.125 MG SL tablet Place 1 tablet (0.125 mg total) under the tongue every 4 (four) hours as needed for cramping for upto 10 days. 30  tablet 0   magnesium 30 MG tablet Take 200 mg by mouth 2 (two) times daily.     metFORMIN (GLUCOPHAGE) 500 MG tablet Take 1 tablet (500 mg total) by mouth 2 (two) times daily with a meal. 180 tablet 0   Multiple Vitamins-Iron (MULTIVITAMINS WITH IRON) TABS tablet Take 1 tablet by mouth daily.     omeprazole (PRILOSEC) 40 MG capsule Take 1 capsule (40 mg total) by mouth in the morning before meal. 90 capsule 3   ondansetron (ZOFRAN-ODT) 4 MG disintegrating tablet Take 1 tablet (4 mg total) by mouth every 8 (eight) hours as needed for nausea or vomiting. 20 tablet 0   oxyCODONE (ROXICODONE) 5 MG immediate release tablet Take 1 tablet (5 mg total) by mouth every 6 (six) hours as needed for breakthrough pain. 6 tablet 0   phentermine (ADIPEX-P) 37.5 MG tablet Take 1 tablet (37.5 mg total) by mouth daily before breakfast. 30 tablet 0   sucralfate (CARAFATE) 1 g tablet Take 1 tablet (1 g total) by mouth 3 (three) times daily. 21 tablet 0   traZODone (DESYREL) 50 MG tablet Take 1-2 tablets (50-100 mg total) by mouth at bedtime as needed for sleep. 40 tablet 0   Turmeric (QC TUMERIC COMPLEX PO) Take 1 tablet by mouth daily.     Vitamin A (A-25 PO) Take 1 tablet by mouth daily.     zinc gluconate 50 MG tablet Take 50 mg by mouth daily.     Current Facility-Administered Medications  Medication Dose Route Frequency Provider Last Rate Last Admin   0.9 %  sodium chloride infusion  500 mL Intravenous Once Mansouraty, Netty Starring., MD       No results found.  Review of Systems:   A ROS was performed including pertinent positives and negatives as documented in the HPI.  Physical Exam :   Constitutional: NAD and appears stated  age Neurological: Alert and oriented Psych: Appropriate affect and cooperative There were no vitals taken for this visit.   Comprehensive Musculoskeletal Exam:      Musculoskeletal Exam  Gait Normal  Alignment Normal   Right Left  Inspection Normal Normal  Palpation    Tenderness Medial joint line Medial joint line  Crepitus None None  Effusion Positive Negative  Range of Motion    Extension -3 -3  Flexion 135 135  Strength    Extension 5/5 5/5  Flexion 5/5 5/5  Ligament Exam     Generalized Laxity No No  Lachman Negative Negative   Pivot Shift Negative Negative  Anterior Drawer Negative Negative  Valgus at 0 Negative Negative  Valgus at 20 Negative Negative  Varus at 0 0 0  Varus at 20   0 0  Posterior Drawer at 90 0 0  Vascular/Lymphatic Exam    Edema None None  Venous Stasis Changes No No  Distal Circulation Normal Normal  Neurologic    Light Touch Sensation Intact Intact  Special Tests:      Imaging:   Xray (4 views right knee, 4 views left knee): Status post left knee ACL reconstruction without evidence of complication.  Medial joint line does have joint space narrowing consistent with osteoarthritis versus meniscal injury, right knee medial space narrowing consistent with medial meniscal injury versus early osteoarthritis    I personally reviewed and interpreted the radiographs.   Assessment:   47 y.o. female with right knee pain consistent with a possible meniscal root injury.  Given this I would like to obtain  an MRI to rule out this as this would potentially need to be intervened upon.  With regard to the left knee at this point she does not have residual instability although I do believe there may be a coexisting medial meniscal tear.  To that effect we will plan for bilateral knee MRIs and follow-up to discuss results  Plan :    -Plan for bilateral knee MRIs to follow-up discuss results     I personally saw and evaluated the patient, and  participated in the management and treatment plan.  Huel Cote, MD Attending Physician, Orthopedic Surgery  This document was dictated using Dragon voice recognition software. A reasonable attempt at proof reading has been made to minimize errors.

## 2023-09-18 ENCOUNTER — Ambulatory Visit (INDEPENDENT_AMBULATORY_CARE_PROVIDER_SITE_OTHER): Payer: No Typology Code available for payment source | Admitting: Family Medicine

## 2023-09-18 ENCOUNTER — Encounter: Payer: Self-pay | Admitting: Family Medicine

## 2023-09-18 ENCOUNTER — Other Ambulatory Visit (HOSPITAL_BASED_OUTPATIENT_CLINIC_OR_DEPARTMENT_OTHER): Payer: Self-pay

## 2023-09-18 DIAGNOSIS — Z9884 Bariatric surgery status: Secondary | ICD-10-CM | POA: Diagnosis not present

## 2023-09-18 DIAGNOSIS — Z6841 Body Mass Index (BMI) 40.0 and over, adult: Secondary | ICD-10-CM

## 2023-09-18 MED ORDER — PHENTERMINE HCL 37.5 MG PO TABS
37.5000 mg | ORAL_TABLET | Freq: Every day | ORAL | 0 refills | Status: DC
Start: 2023-09-18 — End: 2024-07-28
  Filled 2023-09-18: qty 30, 30d supply, fill #0

## 2023-09-18 MED ORDER — PHENTERMINE HCL 37.5 MG PO TABS
37.5000 mg | ORAL_TABLET | Freq: Every day | ORAL | 0 refills | Status: DC
Start: 2023-11-17 — End: 2024-07-28

## 2023-09-18 MED ORDER — PHENTERMINE HCL 37.5 MG PO TABS
37.5000 mg | ORAL_TABLET | Freq: Every day | ORAL | 0 refills | Status: DC
Start: 2023-10-18 — End: 2024-07-28
  Filled 2023-10-24: qty 30, 30d supply, fill #0

## 2023-09-18 NOTE — Progress Notes (Signed)
Established Patient Office Visit   Subjective  Patient ID: Krista Fisher, female    DOB: [birth year]  Age: 47 y.o. MRN: [id number]  Chief Complaint  Patient presents with   Weight Loss    Patient is a 47 year old female seen for follow-up on chronic conditions.  Patient with history of duodenal switch 07/2022 for weight loss.  Since procedure patient has gained weight.  Currently exercising 7 days/week since last office visit.  Endorses frustration as no changes seen.  Patient drinking water, protein shake and having yogurt in the morning which makes her full.  She may not eat again until 1 or 2 PM.  Patient states having several small meals throughout the day is difficult.  Recently seen at [facility] bariatric surgery for follow-up.  Concern for hiatal hernia and possible dumping syndrome causing some patient's abdominal symptoms.  Patient advised to see a nutritionist.  Requesting referral.  Patient would like refill on phentermine.  Patient states mental health is good.  Able to fall asleep but may wake up in the night.  Seen by [facility].    Past Medical History:  Diagnosis Date   GERD (gastroesophageal reflux disease)    Hypertension    Past Surgical History:  Procedure Laterality Date   CESAREAN SECTION     CHOLECYSTECTOMY     GASTRECTOMY     IR ANGIOGRAM PELVIS SELECTIVE OR SUPRASELECTIVE  09/06/2020   IR ANGIOGRAM SELECTIVE EACH ADDITIONAL VESSEL  09/06/2020   IR ANGIOGRAM SELECTIVE EACH ADDITIONAL VESSEL  09/06/2020   IR EMBO TUMOR ORGAN ISCHEMIA INFARCT INC GUIDE ROADMAPPING  09/06/2020   IR RADIOLOGIST EVAL & MGMT  07/11/2020   IR RADIOLOGIST EVAL & MGMT  09/19/2020   IR RADIOLOGIST EVAL & MGMT  05/08/2021   IR US GUIDE VASC ACCESS LEFT  09/06/2020   KNEE SURGERY     LAPAROSCOPIC GASTRIC RESTRICTIVE DUODENAL PROCEDURE (DUODENAL SWITCH)     LAPAROSCOPY N/A 08/08/2022   Procedure: LAPAROSCOPY DIAGNOSTIC;  Surgeon: Quentin Ore, MD;  Location: [facility] OR;  Service: General;   Laterality: N/A;   LAPAROTOMY N/A 08/08/2022   Procedure: EXPLORATORY LAPAROTOMY;  Surgeon: Quentin Ore, MD;  Location: [facility] OR;  Service: General;  Laterality: N/A;   WRIST FRACTURE SURGERY     Social History   Tobacco Use   Smoking status: Never   Smokeless tobacco: Never  Vaping Use   Vaping status: Never Used  Substance Use Topics   Alcohol use: No   Drug use: No   Family History  Problem Relation Age of Onset   Hyperthyroidism Mother    Learning disabilities Father    Hypertension Father    Hyperlipidemia Father    Heart disease Father    Heart failure Father    Diabetes Father    Multiple sclerosis Sister    Depression Paternal Uncle    Drug abuse Paternal Uncle    Early death Paternal Uncle    Diabetes Maternal Grandmother    Intellectual disability Maternal Grandmother    Hypertension Maternal Grandmother    Hyperlipidemia Maternal Grandmother    Heart disease Maternal Grandmother    Diabetes Paternal Grandmother    Learning disabilities Son    Depression Son    Colon cancer Neg Hx    Stomach cancer Neg Hx    Esophageal cancer Neg Hx    Pancreatic cancer Neg Hx    Allergies  Allergen Reactions   Morphine Other (See Comments) and Itching   Lamotrigine Other (See Comments)  Patient had headaches, abdominal cramping   Protein Other (See Comments)   Wellbutrin Xl [Bupropion] Other (See Comments)    Abdominal pain   Latex Rash and Other (See Comments)    Burning      ROS Negative unless stated above    Objective:     BP 110/76 (BP Location: Left Arm, Patient Position: Sitting, Cuff Size: Large)   Pulse 100   Temp 99.3 F (37.4 C) (Oral)   Ht 5\' 6"  (1.676 m)   Wt 285 lb (129.3 kg)   LMP  (LMP Unknown)   SpO2 96%   BMI 46.00 kg/m  BP Readings from Last 3 Encounters:  09/18/23 110/76  07/31/23 114/76  07/23/23 101/74   Wt Readings from Last 3 Encounters:  09/18/23 285 lb (129.3 kg)  07/31/23 281 lb 3.2 oz (127.6 kg)  07/22/23  270 lb (122.5 kg)      Physical Exam Constitutional:      General: She is not in acute distress.    Appearance: Normal appearance.  HENT:     Head: Normocephalic and atraumatic.     Nose: Nose normal.     Mouth/Throat:     Mouth: Mucous membranes are moist.  Cardiovascular:     Rate and Rhythm: Normal rate and regular rhythm.     Heart sounds: Normal heart sounds. No murmur heard.    No gallop.  Pulmonary:     Effort: Pulmonary effort is normal. No respiratory distress.     Breath sounds: Normal breath sounds. No wheezing, rhonchi or rales.  Abdominal:     General: Bowel sounds are normal.     Palpations: Abdomen is soft.  Skin:    General: Skin is warm and dry.  Neurological:     Mental Status: She is alert and oriented to person, place, and time.      No results found for any visits on 09/18/23.    Assessment & Plan:  Morbid obesity (HCC) -Body mass index is 46 kg/m. -     Phentermine HCl; Take 1 tablet (37.5 mg total) by mouth daily before breakfast.  Dispense: 30 tablet; Refill: 0 -     Phentermine HCl; Take 1 tablet (37.5 mg total) by mouth daily before breakfast.  Dispense: 30 tablet; Refill: 0 -     Phentermine HCl; Take 1 tablet (37.5 mg total) by mouth daily before breakfast.  Dispense: 30 tablet; Refill: 0 -     Amb Referral to Nutrition and Diabetic Education  Status post biliopancreatic diversion with duodenal switch -     Amb Referral to Nutrition and Diabetic Education  Pt having difficulty losing weight s/p bariatric surgery 1 year ago.  Continue lifestyle modifications.  Phentermine refilled x 3 months.  Referral placed to nutrition.  Continue follow-up with bariatric surgery center at [facility].  Continue follow-up with GI for continued abdominal symptoms.  Given strict precautions.  Return in about 12 weeks (around 12/11/2023).   Deeann Saint, MD

## 2023-10-02 ENCOUNTER — Encounter (HOSPITAL_BASED_OUTPATIENT_CLINIC_OR_DEPARTMENT_OTHER): Payer: Self-pay

## 2023-10-02 ENCOUNTER — Other Ambulatory Visit (HOSPITAL_BASED_OUTPATIENT_CLINIC_OR_DEPARTMENT_OTHER): Payer: Self-pay | Admitting: Orthopaedic Surgery

## 2023-10-02 DIAGNOSIS — G8929 Other chronic pain: Secondary | ICD-10-CM

## 2023-10-07 ENCOUNTER — Other Ambulatory Visit (HOSPITAL_BASED_OUTPATIENT_CLINIC_OR_DEPARTMENT_OTHER): Payer: Self-pay

## 2023-10-07 ENCOUNTER — Encounter (HOSPITAL_BASED_OUTPATIENT_CLINIC_OR_DEPARTMENT_OTHER): Payer: Self-pay

## 2023-10-07 MED ORDER — WEGOVY 0.25 MG/0.5ML ~~LOC~~ SOAJ
0.2500 mg | SUBCUTANEOUS | 0 refills | Status: DC
Start: 2023-10-07 — End: 2024-07-28
  Filled 2023-10-07 – 2023-10-09 (×2): qty 2, 28d supply, fill #0

## 2023-10-09 ENCOUNTER — Other Ambulatory Visit (HOSPITAL_BASED_OUTPATIENT_CLINIC_OR_DEPARTMENT_OTHER): Payer: Self-pay

## 2023-10-09 ENCOUNTER — Other Ambulatory Visit: Payer: No Typology Code available for payment source

## 2023-10-14 ENCOUNTER — Ambulatory Visit (HOSPITAL_BASED_OUTPATIENT_CLINIC_OR_DEPARTMENT_OTHER): Payer: Medicaid Other | Admitting: Orthopaedic Surgery

## 2023-10-15 ENCOUNTER — Telehealth: Payer: No Typology Code available for payment source | Admitting: Physician Assistant

## 2023-10-15 DIAGNOSIS — B3731 Acute candidiasis of vulva and vagina: Secondary | ICD-10-CM

## 2023-10-15 MED ORDER — FLUCONAZOLE 150 MG PO TABS: ORAL_TABLET | ORAL | 0 refills | Status: DC

## 2023-10-15 NOTE — Progress Notes (Signed)

## 2023-10-15 NOTE — Progress Notes (Signed)
I have spent 5 minutes in review of e-visit questionnaire, review and updating patient chart, medical decision making and response to patient.   Mia Milan Cody Jacklynn Dehaas, PA-C    

## 2023-10-24 ENCOUNTER — Other Ambulatory Visit (HOSPITAL_BASED_OUTPATIENT_CLINIC_OR_DEPARTMENT_OTHER): Payer: Self-pay

## 2023-10-26 ENCOUNTER — Encounter (HOSPITAL_BASED_OUTPATIENT_CLINIC_OR_DEPARTMENT_OTHER): Payer: Self-pay | Admitting: Physical Therapy

## 2023-10-26 ENCOUNTER — Ambulatory Visit (HOSPITAL_BASED_OUTPATIENT_CLINIC_OR_DEPARTMENT_OTHER): Payer: No Typology Code available for payment source | Attending: Orthopaedic Surgery | Admitting: Physical Therapy

## 2023-10-26 ENCOUNTER — Other Ambulatory Visit (HOSPITAL_BASED_OUTPATIENT_CLINIC_OR_DEPARTMENT_OTHER): Payer: Self-pay

## 2023-10-26 ENCOUNTER — Other Ambulatory Visit: Payer: Self-pay

## 2023-10-26 DIAGNOSIS — M6281 Muscle weakness (generalized): Secondary | ICD-10-CM | POA: Diagnosis not present

## 2023-10-26 DIAGNOSIS — M25561 Pain in right knee: Secondary | ICD-10-CM | POA: Diagnosis present

## 2023-10-26 DIAGNOSIS — M25562 Pain in left knee: Secondary | ICD-10-CM | POA: Diagnosis not present

## 2023-10-26 DIAGNOSIS — G8929 Other chronic pain: Secondary | ICD-10-CM | POA: Diagnosis not present

## 2023-10-26 MED ORDER — SUCRALFATE 1 G PO TABS
1.0000 g | ORAL_TABLET | Freq: Three times a day (TID) | ORAL | 0 refills | Status: AC
  Filled 2023-10-26: qty 21, 7d supply, fill #0

## 2023-10-26 MED ORDER — WEGOVY 0.5 MG/0.5ML ~~LOC~~ SOAJ
0.5000 mg | SUBCUTANEOUS | 0 refills | Status: DC
  Filled 2023-10-26: qty 2, 28d supply, fill #0

## 2023-10-26 NOTE — Therapy (Signed)
OUTPATIENT PHYSICAL THERAPY EVALUATION   Patient Name: Krista Fisher MRN: [id number] DOB:1976-07-21, 47 y.o., female Today's Date: 10/26/2023  END OF SESSION:  PT End of Session - 10/26/23 1013     Visit Number 1    Number of Visits 13    Date for PT Re-Evaluation 01/23/24    PT Start Time 0930    PT Stop Time 1013    PT Time Calculation (min) 43 min    Activity Tolerance Patient tolerated treatment well    Behavior During Therapy WFL for tasks assessed/performed             Past Medical History:  Diagnosis Date   GERD (gastroesophageal reflux disease)    Hypertension    Past Surgical History:  Procedure Laterality Date   CESAREAN SECTION     CHOLECYSTECTOMY     GASTRECTOMY     IR ANGIOGRAM PELVIS SELECTIVE OR SUPRASELECTIVE  09/06/2020   IR ANGIOGRAM SELECTIVE EACH ADDITIONAL VESSEL  09/06/2020   IR ANGIOGRAM SELECTIVE EACH ADDITIONAL VESSEL  09/06/2020   IR EMBO TUMOR ORGAN ISCHEMIA INFARCT INC GUIDE ROADMAPPING  09/06/2020   IR RADIOLOGIST EVAL & MGMT  07/11/2020   IR RADIOLOGIST EVAL & MGMT  09/19/2020   IR RADIOLOGIST EVAL & MGMT  05/08/2021   IR US GUIDE VASC ACCESS LEFT  09/06/2020   KNEE SURGERY     LAPAROSCOPIC GASTRIC RESTRICTIVE DUODENAL PROCEDURE (DUODENAL SWITCH)     LAPAROSCOPY N/A 08/08/2022   Procedure: LAPAROSCOPY DIAGNOSTIC;  Surgeon: Quentin Ore, MD;  Location: [facility] OR;  Service: General;  Laterality: N/A;   LAPAROTOMY N/A 08/08/2022   Procedure: EXPLORATORY LAPAROTOMY;  Surgeon: Quentin Ore, MD;  Location: [facility] OR;  Service: General;  Laterality: N/A;   WRIST FRACTURE SURGERY     Patient Active Problem List   Diagnosis Date Noted   Pancreatitis 06/24/2023   S/P hernia surgery 08/19/2022   S/P small bowel resection 08/19/2022   Hospital discharge follow-up 08/19/2022   Fibroid 09/06/2020   Benign neoplasm of connective and other soft tissue, unspecified 09/06/2020   Defecation symptom 07/02/2020   Rectal pain 06/04/2020    Anal fissure 04/05/2020   Hemorrhoids 04/05/2020   Dysmenorrhea 02/07/2020   Polycystic ovary syndrome 02/07/2020   Status post biliopancreatic diversion with duodenal switch 01/17/2020   Excessive daytime sleepiness 01/17/2020   Hypersomnia with sleep apnea 01/17/2020   Loud snoring 01/17/2020   Morbid obesity with body mass index of 50 or higher (HCC) 01/17/2020   Morbid obesity with BMI of 40.0-44.9, adult (HCC) 09/15/2019   Arm numbness 03/08/2018   Anxiety state 04/11/2014   Intramural leiomyoma of uterus 11/03/2013   Menorrhagia 11/03/2013   Knee pain 05/28/2013   Osteoarthrosis involving lower leg 05/06/2011   EDEMA 02/04/2011   CHEST PAIN-UNSPECIFIED 02/04/2011   Vitamin D deficiency 12/03/2009   Allergic rhinitis 12/03/2009   Morbid obesity (HCC) 10/31/2009   Benign essential hypertension 10/31/2009   BLOCK, OTHER HEART 10/31/2009   URI 10/31/2009   Gastro-esophageal reflux disease without esophagitis 10/31/2009   SLEEP APNEA 10/31/2009   PALPITATIONS 10/31/2009   1st degree AV block 10/31/2009   Major depressive disorder, single episode, unspecified 10/31/2009   Fatty liver disease, nonalcoholic 12/22/2008   Chronic joint pain 12/22/2008   Low back pain 04/06/1999   Depressive disorder 04/05/1994   Chronic post-traumatic stress disorder (PTSD) 04/05/1994     REFERRING PROVIDER:  Huel Cote, MD   REFERRING DIAG:  [phone] (ICD-10-CM) - Chronic pain of right  knee     Rationale for Evaluation and Treatment: Rehabilitation  THERAPY DIAG:  Chronic pain of right knee  Chronic pain of left knee  Muscle weakness (generalized)  ONSET DATE: several years on and off   SUBJECTIVE:                                                                                                                                                                                           SUBJECTIVE STATEMENT: Off and on for several years. Just moved into a townhouse with  a lot of stairs. Started working out a lot. Doing elliptical and bike. Right knee tends to hurt on the lateral side, left knee wraps anteriorly and then medially (but is rare). Right hip pain but I think it's the way I am sleeping. Rt knee occasionally buckles. Descending stairs usually is the only way that hurts unless I aggravate it. Dont notice a difference when wearing tennis shoes vs crocs but always wears shoes.    PERTINENT HISTORY:  Chronic nature, August 2023 major abdominal surgery  PAIN:  Are you having pain? Yes: NPRS scale: no pain at rest/10 Pain location: bilateral knee beginning laterally Pain description: sore ache Aggravating factors: descending stairs Relieving factors: braces when working out  PRECAUTIONS:  None  RED FLAGS: None   WEIGHT BEARING RESTRICTIONS:  No  FALLS:  Has patient fallen in last 6 months? 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   PLOF:  Independent  PATIENT GOALS:  Descend stairs without knees hurting.    OBJECTIVE:  Note: Objective measures were completed at Evaluation unless otherwise noted.  DIAGNOSTIC FINDINGS:  DG bil knee 09/16/23:Postsurgical and degenerative changes on the left. Unremarkable appearance on the right.   PATIENT SURVEYS:  FOTO 54   GAIT:  Comments: bilateral calcaneal eversion in stance phase     TREATMENT:                                                                                                                               DATE: eval 11/4 Sidelying  hip burner series Ascend stairs- glut set Descend stairs- reducing valgus     PATIENT EDUCATION:  Education details: Anatomy of condition, POC, HEP, exercise form/rationale Person educated: Patient Education method: Explanation, Demonstration, Tactile cues, Verbal cues, and Handouts Education comprehension: verbalized understanding, returned demonstration, verbal cues required, tactile cues required, and needs further  education  HOME EXERCISE PROGRAM: [email or web address]   ASSESSMENT:  CLINICAL IMPRESSION: Patient is a 47 y.o. F who was seen today for physical therapy evaluation and treatment for bilateral knee pain. Notable calcaneal eversion in stance phase of gait in bilateral LEs.  Bilateral genu valgus with compensation through pelvis in descending stairs. Will benefit from skilled PT to address LE functional weakness. Will also benefit from lumbopelvic strengthening following abdominal surgery that resulted in significant diastasis recti and is causing lack of central stability. Encouraged use of soft abdominal binder for core exercises.    REHAB POTENTIAL: Good  CLINICAL DECISION MAKING: Stable/uncomplicated  EVALUATION COMPLEXITY: Low   GOALS: Goals reviewed with patient? Yes  SHORT TERM GOALS: Target date: 11/23  Demo glut set with ascending stairs Baseline: Goal status: INITIAL  2.  Knee valgus control in descending stairs Baseline:  Goal status: INITIAL   LONG TERM GOALS: Target date: POC date  Meet FOTO goal Baseline:  Goal status: INITIAL  2.  Independent in long term progression of core stability safe with diastasis recti Baseline:  Goal status: INITIAL  3.  SLS with good balance control and without pain on stable and unstable surfaces Baseline:  Goal status: INITIAL    PLAN:  PT FREQUENCY: 1x/week  PT DURATION: 12 weeks  PLANNED INTERVENTIONS: 97164- PT Re-evaluation, 97110-Therapeutic exercises, 97530- Therapeutic activity, 97112- Neuromuscular re-education, 97535- Self Care, 16109- Manual therapy, [id number]- Gait training, [phone]- Aquatic Therapy, [phone]- Ultrasound, 91478- Ionotophoresis 4mg /ml Dexamethasone, Patient/Family education, Balance training, Stair training, Taping, Dry Needling, Spinal mobilization, Cryotherapy, and Moist heat.  PLAN FOR NEXT SESSION: progress OKC hip abd strength, continue with core progression   Deny Chevez C. Madelyn Tlatelpa PT, DPT 10/26/23 12:46  PM

## 2023-10-27 ENCOUNTER — Other Ambulatory Visit (HOSPITAL_BASED_OUTPATIENT_CLINIC_OR_DEPARTMENT_OTHER): Payer: Self-pay

## 2023-11-03 ENCOUNTER — Other Ambulatory Visit (HOSPITAL_BASED_OUTPATIENT_CLINIC_OR_DEPARTMENT_OTHER): Payer: Self-pay

## 2023-11-04 ENCOUNTER — Other Ambulatory Visit (HOSPITAL_BASED_OUTPATIENT_CLINIC_OR_DEPARTMENT_OTHER): Payer: Self-pay

## 2023-11-04 MED ORDER — PHENTERMINE HCL 37.5 MG PO TABS
37.5000 mg | ORAL_TABLET | Freq: Every day | ORAL | 0 refills | Status: DC
  Filled 2023-11-04 – 2023-11-30 (×2): qty 30, 30d supply, fill #0

## 2023-11-07 ENCOUNTER — Ambulatory Visit (HOSPITAL_BASED_OUTPATIENT_CLINIC_OR_DEPARTMENT_OTHER): Payer: No Typology Code available for payment source | Admitting: Physical Therapy

## 2023-11-07 DIAGNOSIS — G8929 Other chronic pain: Secondary | ICD-10-CM

## 2023-11-07 DIAGNOSIS — M6281 Muscle weakness (generalized): Secondary | ICD-10-CM

## 2023-11-07 DIAGNOSIS — M25561 Pain in right knee: Secondary | ICD-10-CM | POA: Diagnosis not present

## 2023-11-07 NOTE — Therapy (Signed)
OUTPATIENT PHYSICAL THERAPY TREATMENT   Patient Name: Krista Fisher MRN: [id number] DOB:1976/07/11, 47 y.o., female Today's Date: 11/07/2023  END OF SESSION:  PT End of Session - 11/07/23 1113     Visit Number 2    Number of Visits 13    Date for PT Re-Evaluation 01/23/24    PT Start Time 1046    PT Stop Time 1125    PT Time Calculation (min) 39 min    Activity Tolerance Patient tolerated treatment well    Behavior During Therapy WFL for tasks assessed/performed              Past Medical History:  Diagnosis Date   GERD (gastroesophageal reflux disease)    Hypertension    Past Surgical History:  Procedure Laterality Date   CESAREAN SECTION     CHOLECYSTECTOMY     GASTRECTOMY     IR ANGIOGRAM PELVIS SELECTIVE OR SUPRASELECTIVE  09/06/2020   IR ANGIOGRAM SELECTIVE EACH ADDITIONAL VESSEL  09/06/2020   IR ANGIOGRAM SELECTIVE EACH ADDITIONAL VESSEL  09/06/2020   IR EMBO TUMOR ORGAN ISCHEMIA INFARCT INC GUIDE ROADMAPPING  09/06/2020   IR RADIOLOGIST EVAL & MGMT  07/11/2020   IR RADIOLOGIST EVAL & MGMT  09/19/2020   IR RADIOLOGIST EVAL & MGMT  05/08/2021   IR US GUIDE VASC ACCESS LEFT  09/06/2020   KNEE SURGERY     LAPAROSCOPIC GASTRIC RESTRICTIVE DUODENAL PROCEDURE (DUODENAL SWITCH)     LAPAROSCOPY N/A 08/08/2022   Procedure: LAPAROSCOPY DIAGNOSTIC;  Surgeon: Quentin Ore, MD;  Location: [facility] OR;  Service: General;  Laterality: N/A;   LAPAROTOMY N/A 08/08/2022   Procedure: EXPLORATORY LAPAROTOMY;  Surgeon: Quentin Ore, MD;  Location: [facility] OR;  Service: General;  Laterality: N/A;   WRIST FRACTURE SURGERY     Patient Active Problem List   Diagnosis Date Noted   Pancreatitis 06/24/2023   S/P hernia surgery 08/19/2022   S/P small bowel resection 08/19/2022   Hospital discharge follow-up 08/19/2022   Fibroid 09/06/2020   Benign neoplasm of connective and other soft tissue, unspecified 09/06/2020   Defecation symptom 07/02/2020   Rectal pain 06/04/2020    Anal fissure 04/05/2020   Hemorrhoids 04/05/2020   Dysmenorrhea 02/07/2020   Polycystic ovary syndrome 02/07/2020   Status post biliopancreatic diversion with duodenal switch 01/17/2020   Excessive daytime sleepiness 01/17/2020   Hypersomnia with sleep apnea 01/17/2020   Loud snoring 01/17/2020   Morbid obesity with body mass index of 50 or higher (HCC) 01/17/2020   Morbid obesity with BMI of 40.0-44.9, adult (HCC) 09/15/2019   Arm numbness 03/08/2018   Anxiety state 04/11/2014   Intramural leiomyoma of uterus 11/03/2013   Menorrhagia 11/03/2013   Knee pain 05/28/2013   Osteoarthrosis involving lower leg 05/06/2011   EDEMA 02/04/2011   CHEST PAIN-UNSPECIFIED 02/04/2011   Vitamin D deficiency 12/03/2009   Allergic rhinitis 12/03/2009   Morbid obesity (HCC) 10/31/2009   Benign essential hypertension 10/31/2009   BLOCK, OTHER HEART 10/31/2009   URI 10/31/2009   Gastro-esophageal reflux disease without esophagitis 10/31/2009   SLEEP APNEA 10/31/2009   PALPITATIONS 10/31/2009   1st degree AV block 10/31/2009   Major depressive disorder, single episode, unspecified 10/31/2009   Fatty liver disease, nonalcoholic 12/22/2008   Chronic joint pain 12/22/2008   Low back pain 04/06/1999   Depressive disorder 04/05/1994   Chronic post-traumatic stress disorder (PTSD) 04/05/1994     REFERRING PROVIDER:  Huel Cote, MD   REFERRING DIAG:  [phone] (ICD-10-CM) - Chronic pain of  right knee     Rationale for Evaluation and Treatment: Rehabilitation  THERAPY DIAG:  Chronic pain of right knee  Chronic pain of left knee  Muscle weakness (generalized)  ONSET DATE: several years on and off   SUBJECTIVE:                                                                                                                                                                                           SUBJECTIVE STATEMENT: Pt reports compliance with her HEP. She states that she has  been having DOM's but no increase in pain. No knee buckling recently.   Off and on for several years. Just moved into a townhouse with a lot of stairs. Started working out a lot. Doing elliptical and bike. Right knee tends to hurt on the lateral side, left knee wraps anteriorly and then medially (but is rare). Right hip pain but I think it's the way I am sleeping. Rt knee occasionally buckles. Descending stairs usually is the only way that hurts unless I aggravate it. Dont notice a difference when wearing tennis shoes vs crocs but always wears shoes.    PERTINENT HISTORY:  Chronic nature, August 2023 major abdominal surgery  PAIN:  Are you having pain? Yes: NPRS scale: no pain at rest/10 Pain location: bilateral knee beginning laterally Pain description: sore ache Aggravating factors: descending stairs Relieving factors: braces when working out  PRECAUTIONS:  None  RED FLAGS: None   WEIGHT BEARING RESTRICTIONS:  No  FALLS:  Has patient fallen in last 6 months? 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   PLOF:  Independent  PATIENT GOALS:  Descend stairs without knees hurting.    OBJECTIVE:  Note: Objective measures were completed at Evaluation unless otherwise noted.  DIAGNOSTIC FINDINGS:  DG bil knee 09/16/23:Postsurgical and degenerative changes on the left. Unremarkable appearance on the right.   PATIENT SURVEYS:  FOTO 53   GAIT:  Comments: bilateral calcaneal eversion in stance phase     TREATMENT:         Date: 11/07/2023: Recumbent bike lvl 2, 5 min Sidelying hip burner series Ascend stairs- glut set Descend stairs- reducing  valgus                   Leg press 112lbs double  SL leg press 75lbs Heel touches off step 15x each side  1 lap around clinic  Free squats at bar Heel raises DL, two up, one down  Step up 10x each leg Lateral step up 5x each leg  DATE: eval 11/4 Sidelying hip burner series Ascend stairs- glut set Descend stairs- reducing valgus     PATIENT EDUCATION:  Education details: Anatomy of condition, POC, HEP, exercise form/rationale Person educated: Patient Education method: Explanation, Demonstration, Tactile cues, Verbal cues, and Handouts Education comprehension: verbalized understanding, returned demonstration, verbal cues required, tactile cues required, and needs further education  HOME EXERCISE PROGRAM: [email or web address]   ASSESSMENT:  CLINICAL IMPRESSION: Patient presents to first f/u appt with no new reports. Session with focus on LE strengthening with increased fatigue noted and reported. Pt tolerated session well, with continued weakness noted with stairs. Pt encouraged to not wear knee braces while working out if possible. She departs with no increase in symptoms, but noted muscle fatigue. Pt will continue to benefit from skilled PT to address continued deficits.    REHAB POTENTIAL: Good  CLINICAL DECISION MAKING: Stable/uncomplicated  EVALUATION COMPLEXITY: Low   GOALS: Goals reviewed with patient? Yes  SHORT TERM GOALS: Target date: 11/23  Demo glut set with ascending stairs Baseline: Goal status: INITIAL  2.  Knee valgus control in descending stairs Baseline:  Goal status: INITIAL   LONG TERM GOALS: Target date: POC date  Meet FOTO goal Baseline:  Goal status: INITIAL  2.  Independent in long term progression of core stability safe with diastasis recti Baseline:  Goal status: INITIAL  3.  SLS with good balance control and without pain on stable and unstable surfaces Baseline:  Goal status: INITIAL    PLAN:  PT FREQUENCY: 1x/week  PT DURATION: 12 weeks  PLANNED INTERVENTIONS: 97164- PT Re-evaluation, 97110-Therapeutic exercises, 97530- Therapeutic activity, 97112- Neuromuscular re-education, 97535- Self Care, 78295- Manual therapy, [id number]- Gait training, [phone]-  Aquatic Therapy, [phone]- Ultrasound, 46962- Ionotophoresis 4mg /ml Dexamethasone, Patient/Family education, Balance training, Stair training, Taping, Dry Needling, Spinal mobilization, Cryotherapy, and Moist heat.  PLAN FOR NEXT SESSION: progress [place] hip abd strength, continue with core progression   Royal Hawthorn PT, DPT 11/07/23  11:27 AM

## 2023-11-11 ENCOUNTER — Encounter: Payer: Self-pay | Admitting: Gastroenterology

## 2023-11-11 ENCOUNTER — Ambulatory Visit: Payer: No Typology Code available for payment source | Admitting: Gastroenterology

## 2023-11-11 ENCOUNTER — Other Ambulatory Visit (INDEPENDENT_AMBULATORY_CARE_PROVIDER_SITE_OTHER): Payer: No Typology Code available for payment source

## 2023-11-11 VITALS — BP 126/68 | HR 103 | Ht 66.0 in | Wt 289.0 lb

## 2023-11-11 DIAGNOSIS — Z9049 Acquired absence of other specified parts of digestive tract: Secondary | ICD-10-CM

## 2023-11-11 DIAGNOSIS — R7989 Other specified abnormal findings of blood chemistry: Secondary | ICD-10-CM

## 2023-11-11 DIAGNOSIS — Z1211 Encounter for screening for malignant neoplasm of colon: Secondary | ICD-10-CM

## 2023-11-11 DIAGNOSIS — D509 Iron deficiency anemia, unspecified: Secondary | ICD-10-CM

## 2023-11-11 DIAGNOSIS — K838 Other specified diseases of biliary tract: Secondary | ICD-10-CM

## 2023-11-11 DIAGNOSIS — R9389 Abnormal findings on diagnostic imaging of other specified body structures: Secondary | ICD-10-CM

## 2023-11-11 DIAGNOSIS — K449 Diaphragmatic hernia without obstruction or gangrene: Secondary | ICD-10-CM

## 2023-11-11 DIAGNOSIS — K769 Liver disease, unspecified: Secondary | ICD-10-CM

## 2023-11-11 DIAGNOSIS — K146 Glossodynia: Secondary | ICD-10-CM

## 2023-11-11 LAB — CBC
HCT: 41.6 % (ref 36.0–46.0)
Hemoglobin: 13.9 g/dL (ref 12.0–15.0)
MCHC: 33.5 g/dL (ref 30.0–36.0)
MCV: 79.9 fL (ref 78.0–100.0)
Platelets: 246 10*3/uL (ref 150.0–400.0)
RBC: 5.21 Mil/uL — ABNORMAL HIGH (ref 3.87–5.11)
RDW: 14.5 % (ref 11.5–15.5)
WBC: 7.6 10*3/uL (ref 4.0–10.5)

## 2023-11-11 LAB — IBC + FERRITIN
Ferritin: 14.9 ng/mL (ref 10.0–291.0)
Iron: 85 ug/dL (ref 42–145)
Saturation Ratios: 20.9 % (ref 20.0–50.0)
TIBC: 407.4 ug/dL (ref 250.0–450.0)
Transferrin: 291 mg/dL (ref 212.0–360.0)

## 2023-11-11 LAB — COMPREHENSIVE METABOLIC PANEL
ALT: 19 U/L (ref 0–35)
AST: 26 U/L (ref 0–37)
Albumin: 4 g/dL (ref 3.5–5.2)
Alkaline Phosphatase: 124 U/L — ABNORMAL HIGH (ref 39–117)
BUN: 9 mg/dL (ref 6–23)
CO2: 29 meq/L (ref 19–32)
Calcium: 9.4 mg/dL (ref 8.4–10.5)
Chloride: 105 meq/L (ref 96–112)
Creatinine, Ser: 1.03 mg/dL (ref 0.40–1.20)
GFR: 64.56 mL/min (ref >60.00)
Glucose, Bld: 79 mg/dL (ref 70–99)
Potassium: 4 meq/L (ref 3.5–5.1)
Sodium: 139 meq/L (ref 135–145)
Total Bilirubin: 0.5 mg/dL (ref 0.2–1.2)
Total Protein: 7.4 g/dL (ref 6.0–8.3)

## 2023-11-11 NOTE — Patient Instructions (Signed)
Your provider has requested that you go to the basement level for lab work before leaving today. Press "B" on the elevator. The lab is located at the first door on the left as you exit the elevator.  Depending on results of labs Dr Meridee Score will consider additional work-up with MRI and possible Colonoscopy.   Due to recent changes in healthcare laws, you may see the results of your imaging and laboratory studies on MyChart before your provider has had a chance to review them.  We understand that in some cases there may be results that are confusing or concerning to you. Not all laboratory results come back in the same time frame and the provider may be waiting for multiple results in order to interpret others.  Please give [place] 48 hours in order for your provider to thoroughly review all the results before contacting the office for clarification of your results.   _______________________________________________________  If your blood pressure at your visit was 140/90 or greater, please contact your primary care physician to follow up on this.  _______________________________________________________  If you are age 48 or older, your body mass index should be between 23-30. Your Body mass index is 46.65 kg/m. If this is out of the aforementioned range listed, please consider follow up with your Primary Care Provider.  If you are age 74 or younger, your body mass index should be between 19-25. Your Body mass index is 46.65 kg/m. If this is out of the aformentioned range listed, please consider follow up with your Primary Care Provider.   ________________________________________________________  The [facility] providers would like to encourage you to use [facility] to communicate with providers for non-urgent requests or questions.  Due to long hold times on the telephone, sending your provider a message by [facility] may be a faster and more efficient way to get a response.  Please allow 48 business hours  for a response.  Please remember that this is for non-urgent requests.  _______________________________________________________  Thank you for choosing me and  Gastroenterology.  Dr. Meridee Score

## 2023-11-11 NOTE — Progress Notes (Signed)
GASTROENTEROLOGY OUTPATIENT CLINIC VISIT   Primary Care Provider Deeann Saint, MD [address] [place] [address] [phone]  Patient Profile: Krista Fisher is a 47 y.o. female with a pmh significant for hypertension,  anxiety, MDD, obesity (status post duodenal switch), GERD, hiatal hernia, hemorrhoids.  The patient presents to the [facility] for an evaluation and management of problem(s) noted below:  Problem List 1. Abnormal LFTs   2. History of cholecystectomy   3. Dilation of biliary tract   4. Liver lesion   5. Abnormal CT scan   6. Iron deficiency anemia, unspecified iron deficiency anemia type   7. Hiatal hernia   8. Colon cancer screening   9. Tongue pain    Discussed the use of AI scribe software for clinical note transcription with the patient, who gave verbal consent to proceed.  History of Present Illness Please see prior GI notes for full details of HPI.  Interval History The patient presents for follow-up.  Since our last evaluation of her at the time of her EGD, she has been doing OK.  She has been noted to have slightly elevated/abnormal liver tests and dilation of the bile duct progression on recent cross-sectional imaging.  The previous RUQ pain has not been an issue for her in months.  She has noted some discomfort/pain surrounding and, on her tongue, most severe in the mornings with a Magic Mouthwash that was not effective/helpful for her from her dentist.  She has a history of iron deficiency, despite not menstruating (she has Nexplanon device). She has been taking iron supplements to manage this.  She was worried about a prior imaging study suggesting she had a hemangioma, but interestingly over the last imaging studies (documented below) she now has a progressive biliary dilation that had not been noted on her MRCP previously.  She continues on omeprazole (for over 10 years) with good control of her pyrosis (if she  misses a dose, she will notice it however).   Interestingly on prior imaging earlier in the year, there has also been concern as to whether there was some sort of ill-defined appendix, although a more recent CT scan did not comment on any abnormalities in the appendix.  She has no RLQ abdominal pain.  She is not having changes in her bowel habits.  no blood in her stool has been noted.   GI Review of Systems Positive as above Negative for dysphagia, bloating, melena, hematochezia  Review of Systems General: Denies fevers/chills/weight loss unintentionally Cardiovascular: Denies chest pain Pulmonary: Denies shortness of breath Gastroenterological: See HPI Genitourinary: Denies darkened urine Hematological: Denies easy bruising/bleeding Dermatological: Denies jaundice Psychological: Mood is stable   Medications Current Outpatient Medications  Medication Sig Dispense Refill   acetaminophen (TYLENOL) 500 MG tablet Take 500 mg by mouth every 6 (six) hours as needed.     ALPRAZolam (XANAX) 0.5 MG tablet Take 1 tablet (0.5 mg total dose) daily as needed for panic attacks. 15 tablet 0   Calcium Carb-Cholecalciferol 600-10 MG-MCG TABS Take 1 tablet by mouth daily.     cetirizine (ZYRTEC) 10 MG tablet Take 10 mg by mouth daily.     COLLAGEN PO Take 1 tablet by mouth daily.      cyanocobalamin (VITAMIN B12) 500 MCG tablet Take 1,000 mcg by mouth daily.     escitalopram (LEXAPRO) 20 MG tablet Take 1 tablet (20 mg total) by mouth daily. 90 tablet 0   escitalopram (LEXAPRO) 20 MG tablet Take  1 tablet (20 mg total) by mouth daily. 90 tablet 3   etonogestrel (NEXPLANON) 68 MG IMPL implant 1 each by Subdermal route once.     ferrous sulfate 325 (65 FE) MG tablet Take 325 mg by mouth daily with breakfast.     fluticasone (FLONASE) 50 MCG/ACT nasal spray Place 1 spray into both nostrils daily.     Ginger, Zingiber officinalis, (GINGER PO) Take 1 tablet by mouth daily.     hyoscyamine (LEVSIN SL) 0.125  MG SL tablet Place 1 tablet (0.125 mg total) under the tongue every 4 (four) hours as needed for cramping for upto 10 days. 30 tablet 0   magnesium 30 MG tablet Take 200 mg by mouth 2 (two) times daily.     Multiple Vitamins-Iron (MULTIVITAMINS WITH IRON) TABS tablet Take 1 tablet by mouth daily.     omeprazole (PRILOSEC) 40 MG capsule Take 1 capsule (40 mg total) by mouth in the morning before meal. 90 capsule 3   ondansetron (ZOFRAN-ODT) 4 MG disintegrating tablet Take 1 tablet (4 mg total) by mouth every 8 (eight) hours as needed for nausea or vomiting. 20 tablet 0   phentermine (ADIPEX-P) 37.5 MG tablet Take 1 tablet (37.5 mg total) by mouth daily before breakfast. 30 tablet 0   phentermine (ADIPEX-P) 37.5 MG tablet Take 1 tablet (37.5 mg total) by mouth daily before breakfast. 30 tablet 0   [START ON 11/17/2023] phentermine (ADIPEX-P) 37.5 MG tablet Take 1 tablet (37.5 mg total) by mouth daily before breakfast. 30 tablet 0   phentermine (ADIPEX-P) 37.5 MG tablet Take 1 tablet (37.5 mg total) by mouth daily before breakfast. 30 tablet 0   Semaglutide-Weight Management (WEGOVY) 0.25 MG/0.5ML SOAJ Inject 0.25 mg into the skin once a week. 2 mL 0   Semaglutide-Weight Management (WEGOVY) 0.5 MG/0.5ML SOAJ Inject 0.5 mg into the skin once a week. 2 mL 0   sucralfate (CARAFATE) 1 g tablet Take 1 tablet (1 g total) by mouth 3 (three) times daily before meals  Take on an empty stomach 21 tablet 0   Turmeric (QC TUMERIC COMPLEX PO) Take 1 tablet by mouth daily.     Vitamin A (A-25 PO) Take 1 tablet by mouth daily.     zinc gluconate 50 MG tablet Take 50 mg by mouth daily.     Current Facility-Administered Medications  Medication Dose Route Frequency Provider Last Rate Last Admin   0.9 %  sodium chloride infusion  500 mL Intravenous Once Mansouraty, Netty Starring., MD        Allergies Allergies  Allergen Reactions   Morphine Other (See Comments) and Itching   Lamotrigine Other (See Comments)     Patient had headaches, abdominal cramping   Protein Other (See Comments)   Wellbutrin Xl [Bupropion] Other (See Comments)    Abdominal pain   Latex Rash and Other (See Comments)    Burning    Histories Past Medical History:  Diagnosis Date   GERD (gastroesophageal reflux disease)    Hypertension    Past Surgical History:  Procedure Laterality Date   CESAREAN SECTION     CHOLECYSTECTOMY     GASTRECTOMY     IR ANGIOGRAM PELVIS SELECTIVE OR SUPRASELECTIVE  09/06/2020   IR ANGIOGRAM SELECTIVE EACH ADDITIONAL VESSEL  09/06/2020   IR ANGIOGRAM SELECTIVE EACH ADDITIONAL VESSEL  09/06/2020   IR EMBO TUMOR ORGAN ISCHEMIA INFARCT INC GUIDE ROADMAPPING  09/06/2020   IR RADIOLOGIST EVAL & MGMT  07/11/2020   IR RADIOLOGIST EVAL &  MGMT  09/19/2020   IR RADIOLOGIST EVAL & MGMT  05/08/2021   IR US GUIDE VASC ACCESS LEFT  09/06/2020   KNEE SURGERY     LAPAROSCOPIC GASTRIC RESTRICTIVE DUODENAL PROCEDURE (DUODENAL SWITCH)     LAPAROSCOPY N/A 08/08/2022   Procedure: LAPAROSCOPY DIAGNOSTIC;  Surgeon: Quentin Ore, MD;  Location: [facility] OR;  Service: General;  Laterality: N/A;   LAPAROTOMY N/A 08/08/2022   Procedure: EXPLORATORY LAPAROTOMY;  Surgeon: Quentin Ore, MD;  Location: [facility] OR;  Service: General;  Laterality: N/A;   WRIST FRACTURE SURGERY     Social History   Socioeconomic History   Marital status: Widowed    Spouse name: Not on file   Number of children: 1   Years of education: Not on file   Highest education level: Master's degree (e.g., MA, MS, MEng, MEd, MSW, MBA)  Occupational History   Not on file  Tobacco Use   Smoking status: Never   Smokeless tobacco: Never  Vaping Use   Vaping status: Never Used  Substance and Sexual Activity   Alcohol use: No   Drug use: No   Sexual activity: Yes  Other Topics Concern   Not on file  Social History Narrative   Not on file   Social Determinants of Health   Financial Resource Strain: Low Risk  (09/15/2023)   Overall  Financial Resource Strain (CARDIA)    Difficulty of Paying Living Expenses: Not hard at all  Food Insecurity: No Food Insecurity (09/15/2023)   Hunger Vital Sign    Worried About Running Out of Food in the Last Year: Never true    Ran Out of Food in the Last Year: Never true  Transportation Needs: No Transportation Needs (09/15/2023)   PRAPARE - Administrator, Civil Service (Medical): No    Lack of Transportation (Non-Medical): No  Physical Activity: Sufficiently Active (09/15/2023)   Exercise Vital Sign    Days of Exercise per Week: 7 days    Minutes of Exercise per Session: 60 min  Stress: Stress Concern Present (09/15/2023)   [employer] of Occupational Health - Occupational Stress Questionnaire    Feeling of Stress : Very much  Social Connections: Moderately Integrated (09/15/2023)   Social Connection and Isolation Panel [NHANES]    Frequency of Communication with Friends and Family: More than three times a week    Frequency of Social Gatherings with Friends and Family: Once a week    Attends Religious Services: More than 4 times per year    Active Member of [employer] or Organizations: Yes    Attends Banker Meetings: More than 4 times per year    Marital Status: Widowed  Intimate Partner Violence: Unknown (03/25/2022)   Received from [facility]   HITS    Physically Hurt: Not on file    Insult or Talk Down To: Not on file    Threaten Physical Harm: Not on file    Scream or Curse: Not on file   Family History  Problem Relation Age of Onset   Hyperthyroidism Mother    Learning disabilities Father    Hypertension Father    Hyperlipidemia Father    Heart disease Father    Heart failure Father    Diabetes Father    Multiple sclerosis Sister    Depression Paternal Uncle    Drug abuse Paternal Uncle    Early death Paternal Uncle    Diabetes Maternal Grandmother    Intellectual disability Maternal  Grandmother    Hypertension Maternal  Grandmother    Hyperlipidemia Maternal Grandmother    Heart disease Maternal Grandmother    Diabetes Paternal Grandmother    Learning disabilities Son    Depression Son    Colon cancer Neg Hx    Stomach cancer Neg Hx    Esophageal cancer Neg Hx    Pancreatic cancer Neg Hx    I have reviewed her medical, social, and family history in detail and updated the electronic medical record as necessary.    PHYSICAL EXAMINATION  BP 126/68   Pulse (!) 103   Ht 5\' 6"  (1.676 m)   Wt 289 lb (131.1 kg)   SpO2 97%   BMI 46.65 kg/m  Wt Readings from Last 3 Encounters:  11/11/23 289 lb (131.1 kg)  09/18/23 285 lb (129.3 kg)  07/31/23 281 lb 3.2 oz (127.6 kg)  GEN: NAD, appears stated age, doesn't appear chronically ill PSYCH: Cooperative, without pressured speech EYE: Conjunctivae pink, sclerae anicteric ENT: MMM NECK: Supple CV: Nontachycardic RESP: No audible wheezing GI: NABS, soft, protuberant abdomen, rounded, obese, NT, without rebound or guarding MSK/EXT: No significant lower extremity edema SKIN: No jaundice NEURO:  Alert & Oriented x 3, no focal deficits   REVIEW OF DATA  I reviewed the following data at the time of this encounter:  GI Procedures and Studies  April 2024 EGD IMPRESSION: No new or acute finding. Negative for bowel obstruction or visible inflammation.  Pathology Diagnosis 1. Surgical [P], gastric antrum and gastric body - GASTRIC ANTRAL AND OXYNTIC MUCOSA WITH MILD CHRONIC GASTRITIS - HELICOBACTER PYLORI-LIKE ORGANISMS ARE NOT IDENTIFIED ON ROUTINE H&E STAIN 2. Surgical [P], small bowel biopsies - SMALL INTESTINAL MUCOSA WITH NO SPECIFIC HISTOPATHOLOGIC CHANGES - NEGATIVE FOR INCREASED INTRAEPITHELIAL LYMPHOCYTES OR VILLOUS ARCHITECTURAL CHANGES  Laboratory Studies  Reviewed those in epic  Imaging Studies  August 2024 abdominal ultrasound IMPRESSION: 1. Mild hepatic steatosis. 2. Status post cholecystectomy. 3. Increase caliber of the CBD measures  9.5 mm. In the absence of signs/symptoms of biliary obstruction this is favored to represent post cholecystectomy physiology.   August 2024 CT abdomen pelvis with contrast IMPRESSION: No new or acute finding. Negative for bowel obstruction or visible inflammation.   ASSESSMENT  Ms. Kushman is a 46 y.o. female with a pmh significant for hypertension,  anxiety, MDD, obesity (status post duodenal switch), GERD, hiatal hernia, hemorrhoids.  The patient is seen today for evaluation and management of:  1. Abnormal LFTs   2. History of cholecystectomy   3. Dilation of biliary tract   4. Liver lesion   5. Abnormal CT scan   6. Iron deficiency anemia, unspecified iron deficiency anemia type   7. Hiatal hernia   8. Colon cancer screening   9. Tongue pain    The patient is hemodynamically and clinically stable overall.  Interestingly in her workup and her imaging of this year, there seems to be a progressive biliary dilation and her most recent set of labs within the last 3 months had shown an elevation in her liver biochemical testing.  With her history of cholecystectomy I would certainly normally consider an endoscopic ultrasound as neck step in her evaluation, but as a result of her duodenal switch, we do not have access to that region of the GI tract in normal fashion.  Thus we cannot perform endoscopic ultrasound successfully.  Repeat MRI/MRCP will need to be considered with the caveat that very small stones less than 5 mm could be missed.  I would not go for a device assisted ERCP attempt unless other more extenuating circumstances or liver biochemical testing and progressive biliary dilation was found.  We will plan a repeat MRI/MRCP for now.  The previous liver lesions were noted to be hemangiomas of the liver so nothing further needs to be pursued at this time.  I am not clear as to the etiology of her tongue pain.  If this persists in ENT evaluation may make sense for further evaluation.  She  does not take any medications that are tablet base but rather also I think this is less likely to be result of contact inflammation.  I am not sure that silent reflux is causing her issues at this point with her tongue either.  Would hold on pH impedance testing for now especially since her true GERD symptoms seem to be well-controlled.  She had some iron deficiency a few months ago of an unclear etiology.  This may be a result of malabsorption from her duodenal switch and that she is not having any menstrual cycles as a result of her Nexplanon, we will need to consider colonoscopy if she still has evidence of anemia with iron deficiency.  Otherwise if no iron deficiency, she was recommended a repeat colonoscopy in 2028.  We will ask our radiology colleagues to rereviewed the CT scan of the appendix and just confirm that the last documentation of no abnormality is still present.  All patient questions were answered to the best of my ability, and the patient agrees to the aforementioned plan of action with follow-up as indicated.   PLAN  Laboratories as outlined below Repeat zinc level as she was found to be low previously If iron deficiency is still found, then we will need to consider colonoscopy Continue current PPI once daily Further evaluation by ENT if tongue issues continue to be an issue for her Proceed with MRI/MRCP for biliary duct dilation and abnormal LFT evaluation - Unfortunately EUS not possible in her postsurgical state Otherwise colonoscopy for screening purposes in 2028 (she had a negative evaluation per report in 2018) We review with radiology any abnormalities in the appendix   Orders Placed This Encounter  Procedures   CBC   Comp Met (CMET)   IBC + Ferritin   IgA   Tissue transglutaminase, IgA   Zinc    New Prescriptions   No medications on file   Modified Medications   No medications on file    Planned Follow Up No follow-ups on file.   Total Time in  Face-to-Face and in Coordination of Care for patient including independent/personal interpretation/review of prior testing, medical history, examination, medication adjustment, communicating results with the patient directly, and documentation within the EHR is 30 minutes.   Corliss Parish, MD [facility] Office # [phone]

## 2023-11-13 DIAGNOSIS — K838 Other specified diseases of biliary tract: Secondary | ICD-10-CM | POA: Insufficient documentation

## 2023-11-13 DIAGNOSIS — Z1211 Encounter for screening for malignant neoplasm of colon: Secondary | ICD-10-CM | POA: Insufficient documentation

## 2023-11-13 DIAGNOSIS — K769 Liver disease, unspecified: Secondary | ICD-10-CM | POA: Insufficient documentation

## 2023-11-13 DIAGNOSIS — K146 Glossodynia: Secondary | ICD-10-CM | POA: Insufficient documentation

## 2023-11-13 DIAGNOSIS — R7989 Other specified abnormal findings of blood chemistry: Secondary | ICD-10-CM | POA: Insufficient documentation

## 2023-11-13 DIAGNOSIS — R9389 Abnormal findings on diagnostic imaging of other specified body structures: Secondary | ICD-10-CM | POA: Insufficient documentation

## 2023-11-13 DIAGNOSIS — D509 Iron deficiency anemia, unspecified: Secondary | ICD-10-CM | POA: Insufficient documentation

## 2023-11-13 DIAGNOSIS — Z9049 Acquired absence of other specified parts of digestive tract: Secondary | ICD-10-CM | POA: Insufficient documentation

## 2023-11-13 DIAGNOSIS — K449 Diaphragmatic hernia without obstruction or gangrene: Secondary | ICD-10-CM | POA: Insufficient documentation

## 2023-11-17 LAB — TISSUE TRANSGLUTAMINASE, IGA: (tTG) Ab, IgA: <1 U/mL

## 2023-11-17 LAB — ZINC: Zinc: 53 ug/dL — ABNORMAL LOW (ref 60–130)

## 2023-11-17 LAB — IGA: Immunoglobulin A: 144 mg/dL (ref 47–310)

## 2023-11-24 ENCOUNTER — Other Ambulatory Visit: Payer: Self-pay

## 2023-11-24 DIAGNOSIS — K769 Liver disease, unspecified: Secondary | ICD-10-CM

## 2023-11-24 DIAGNOSIS — R7989 Other specified abnormal findings of blood chemistry: Secondary | ICD-10-CM

## 2023-11-24 DIAGNOSIS — R9389 Abnormal findings on diagnostic imaging of other specified body structures: Secondary | ICD-10-CM

## 2023-11-24 DIAGNOSIS — K838 Other specified diseases of biliary tract: Secondary | ICD-10-CM

## 2023-11-30 ENCOUNTER — Other Ambulatory Visit (HOSPITAL_BASED_OUTPATIENT_CLINIC_OR_DEPARTMENT_OTHER): Payer: Self-pay

## 2023-12-03 ENCOUNTER — Ambulatory Visit: Payer: No Typology Code available for payment source | Admitting: Family Medicine

## 2023-12-03 ENCOUNTER — Encounter: Payer: Self-pay | Admitting: Family Medicine

## 2023-12-03 VITALS — BP 112/74 | HR 96 | Ht 66.0 in | Wt 276.8 lb

## 2023-12-03 DIAGNOSIS — R0982 Postnasal drip: Secondary | ICD-10-CM | POA: Diagnosis not present

## 2023-12-03 DIAGNOSIS — H66002 Acute suppurative otitis media without spontaneous rupture of ear drum, left ear: Secondary | ICD-10-CM | POA: Diagnosis not present

## 2023-12-03 MED ORDER — AMOXICILLIN-POT CLAVULANATE 500-125 MG PO TABS: 1.0000 | ORAL_TABLET | Freq: Two times a day (BID) | ORAL | 0 refills | Status: AC

## 2023-12-03 NOTE — Progress Notes (Signed)
Established Patient Office Visit   Subjective  Patient ID: Krista Fisher, female    DOB: 10/17/1976  Age: 47 y.o. MRN: [id number]  Chief Complaint  Patient presents with   Ear Fullness    Started 2 days ago, ear fullness, left is worse, congestion and some post nasal drainage,     Patient is a 47 year old female seen for acute concern.  Patient endorses bilateral ear ache/fullness 2 days with left ear being worse.  Patient also notes postnasal drainage and throat irritation and some congestion.  Patient denies cough, fever, chills, nausea, vomiting, facial pain/pressure.  Patient was unsure of what she can take over-the-counter as she is on Wegovy.  Ear Fullness     Patient Active Problem List   Diagnosis Date Noted   Abnormal LFTs 11/13/2023   History of cholecystectomy 11/13/2023   Dilation of biliary tract 11/13/2023   Liver lesion 11/13/2023   Abnormal CT scan 11/13/2023   Iron deficiency anemia 11/13/2023   Hiatal hernia 11/13/2023   Colon cancer screening 11/13/2023   Tongue pain 11/13/2023   Pancreatitis 06/24/2023   S/P hernia surgery 08/19/2022   S/P small bowel resection 08/19/2022   Hospital discharge follow-up 08/19/2022   Fibroid 09/06/2020   Benign neoplasm of connective and other soft tissue, unspecified 09/06/2020   Defecation symptom 07/02/2020   Rectal pain 06/04/2020   Anal fissure 04/05/2020   Hemorrhoids 04/05/2020   Dysmenorrhea 02/07/2020   Polycystic ovary syndrome 02/07/2020   Status post biliopancreatic diversion with duodenal switch 01/17/2020   Excessive daytime sleepiness 01/17/2020   Hypersomnia with sleep apnea 01/17/2020   Loud snoring 01/17/2020   Morbid obesity with body mass index of 50 or higher (HCC) 01/17/2020   Morbid obesity with BMI of 40.0-44.9, adult (HCC) 09/15/2019   Arm numbness 03/08/2018   Anxiety state 04/11/2014   Intramural leiomyoma of uterus 11/03/2013   Menorrhagia 11/03/2013   Knee pain 05/28/2013    Osteoarthrosis involving lower leg 05/06/2011   EDEMA 02/04/2011   CHEST PAIN-UNSPECIFIED 02/04/2011   Vitamin D deficiency 12/03/2009   Allergic rhinitis 12/03/2009   Morbid obesity (HCC) 10/31/2009   Benign essential hypertension 10/31/2009   BLOCK, OTHER HEART 10/31/2009   URI 10/31/2009   Gastro-esophageal reflux disease without esophagitis 10/31/2009   SLEEP APNEA 10/31/2009   PALPITATIONS 10/31/2009   1st degree AV block 10/31/2009   Major depressive disorder, single episode, unspecified 10/31/2009   Fatty liver disease, nonalcoholic 12/22/2008   Chronic joint pain 12/22/2008   Low back pain 04/06/1999   Depressive disorder 04/05/1994   Chronic post-traumatic stress disorder (PTSD) 04/05/1994   Past Medical History:  Diagnosis Date   GERD (gastroesophageal reflux disease)    Hypertension    Past Surgical History:  Procedure Laterality Date   CESAREAN SECTION     CHOLECYSTECTOMY     GASTRECTOMY     IR ANGIOGRAM PELVIS SELECTIVE OR SUPRASELECTIVE  09/06/2020   IR ANGIOGRAM SELECTIVE EACH ADDITIONAL VESSEL  09/06/2020   IR ANGIOGRAM SELECTIVE EACH ADDITIONAL VESSEL  09/06/2020   IR EMBO TUMOR ORGAN ISCHEMIA INFARCT INC GUIDE ROADMAPPING  09/06/2020   IR RADIOLOGIST EVAL & MGMT  07/11/2020   IR RADIOLOGIST EVAL & MGMT  09/19/2020   IR RADIOLOGIST EVAL & MGMT  05/08/2021   IR US GUIDE VASC ACCESS LEFT  09/06/2020   KNEE SURGERY     LAPAROSCOPIC GASTRIC RESTRICTIVE DUODENAL PROCEDURE (DUODENAL SWITCH)     LAPAROSCOPY N/A 08/08/2022   Procedure: LAPAROSCOPY DIAGNOSTIC;  Surgeon: Dossie Der,  Hyman Hopes, MD;  Location: [facility] OR;  Service: General;  Laterality: N/A;   LAPAROTOMY N/A 08/08/2022   Procedure: EXPLORATORY LAPAROTOMY;  Surgeon: Quentin Ore, MD;  Location: [facility] OR;  Service: General;  Laterality: N/A;   WRIST FRACTURE SURGERY     Social History   Tobacco Use   Smoking status: Never   Smokeless tobacco: Never  Vaping Use   Vaping status: Never Used   Substance Use Topics   Alcohol use: No   Drug use: No   Family History  Problem Relation Age of Onset   Hyperthyroidism Mother    Learning disabilities Father    Hypertension Father    Hyperlipidemia Father    Heart disease Father    Heart failure Father    Diabetes Father    Multiple sclerosis Sister    Depression Paternal Uncle    Drug abuse Paternal Uncle    Early death Paternal Uncle    Diabetes Maternal Grandmother    Intellectual disability Maternal Grandmother    Hypertension Maternal Grandmother    Hyperlipidemia Maternal Grandmother    Heart disease Maternal Grandmother    Diabetes Paternal Grandmother    Learning disabilities Son    Depression Son    Colon cancer Neg Hx    Stomach cancer Neg Hx    Esophageal cancer Neg Hx    Pancreatic cancer Neg Hx    Allergies  Allergen Reactions   Morphine Other (See Comments) and Itching   Lamotrigine Other (See Comments)    Patient had headaches, abdominal cramping   Protein Other (See Comments)   Wellbutrin Xl [Bupropion] Other (See Comments)    Abdominal pain   Latex Rash and Other (See Comments)    Burning      ROS Negative unless stated above    Objective:     BP 112/74 (BP Location: Left Arm, Cuff Size: Large)   Pulse 96   Ht 5\' 6"  (1.676 m)   Wt 276 lb 12.8 oz (125.6 kg)   LMP  (LMP Unknown)   SpO2 97%   BMI 44.68 kg/m  BP Readings from Last 3 Encounters:  12/03/23 112/74  11/11/23 126/68  09/18/23 110/76   Wt Readings from Last 3 Encounters:  12/03/23 276 lb 12.8 oz (125.6 kg)  11/11/23 289 lb (131.1 kg)  09/18/23 285 lb (129.3 kg)      Physical Exam Constitutional:      General: She is not in acute distress.    Appearance: Normal appearance.  HENT:     Head: Normocephalic and atraumatic.     Right Ear: Tympanic membrane is not erythematous.     Left Ear: Decreased hearing noted. Tympanic membrane is erythematous and bulging.     Ears:     Comments: Left TM erythematous full with  suppurative fluid.  Right TM full.    Nose: Nose normal.     Mouth/Throat:     Mouth: Mucous membranes are moist.  Cardiovascular:     Rate and Rhythm: Normal rate and regular rhythm.     Heart sounds: Normal heart sounds. No murmur heard.    No gallop.  Pulmonary:     Effort: Pulmonary effort is normal. No respiratory distress.     Breath sounds: Normal breath sounds. No wheezing, rhonchi or rales.  Skin:    General: Skin is warm and dry.  Neurological:     Mental Status: She is alert and oriented to person, place, and time.  No results found for any visits on 12/03/23.    Assessment & Plan:  Acute suppurative otitis media of left ear without spontaneous rupture of tympanic membrane, recurrence not specified -     Amoxicillin-Pot Clavulanate; Take 1 tablet by mouth in the morning and at bedtime for 7 days.  Dispense: 14 tablet; Refill: 0  Post-nasal drainage  New problem.  Start ABX for acute left AOM.  Supportive care with OTC cough/cold medication, Flonase, Tylenol, rest, hydration, etc. given strict precautions.  Return if symptoms worsen or fail to improve.   Deeann Saint, MD

## 2023-12-05 ENCOUNTER — Ambulatory Visit (HOSPITAL_BASED_OUTPATIENT_CLINIC_OR_DEPARTMENT_OTHER): Payer: No Typology Code available for payment source | Admitting: Rehabilitative and Restorative Service Providers"

## 2023-12-05 ENCOUNTER — Encounter (HOSPITAL_BASED_OUTPATIENT_CLINIC_OR_DEPARTMENT_OTHER): Payer: Self-pay

## 2023-12-08 ENCOUNTER — Other Ambulatory Visit: Payer: Self-pay | Admitting: Gastroenterology

## 2023-12-08 ENCOUNTER — Ambulatory Visit (HOSPITAL_COMMUNITY)
Admission: RE | Admit: 2023-12-08 | Discharge: 2023-12-08 | Disposition: A | Discharge status: Home / Self Care | Payer: No Typology Code available for payment source | Source: Ambulatory Visit | Attending: Gastroenterology | Admitting: Gastroenterology

## 2023-12-08 DIAGNOSIS — K838 Other specified diseases of biliary tract: Secondary | ICD-10-CM

## 2023-12-08 DIAGNOSIS — R9389 Abnormal findings on diagnostic imaging of other specified body structures: Secondary | ICD-10-CM | POA: Insufficient documentation

## 2023-12-08 DIAGNOSIS — K769 Liver disease, unspecified: Secondary | ICD-10-CM | POA: Diagnosis present

## 2023-12-08 DIAGNOSIS — R7989 Other specified abnormal findings of blood chemistry: Secondary | ICD-10-CM

## 2023-12-08 MED ORDER — GADOBUTROL 1 MMOL/ML IV SOLN
10.0000 mL | Freq: Once | INTRAVENOUS | Status: AC | PRN
  Administered 2023-12-08: 10 mL via INTRAVENOUS

## 2023-12-11 NOTE — Therapy (Deleted)
OUTPATIENT PHYSICAL THERAPY TREATMENT   Patient Name: Krista Fisher MRN: [id number] DOB:17-Apr-1976, 47 y.o., female Today's Date: 12/11/2023  END OF SESSION:     Past Medical History:  Diagnosis Date   GERD (gastroesophageal reflux disease)    Hypertension    Past Surgical History:  Procedure Laterality Date   CESAREAN SECTION     CHOLECYSTECTOMY     GASTRECTOMY     IR ANGIOGRAM PELVIS SELECTIVE OR SUPRASELECTIVE  09/06/2020   IR ANGIOGRAM SELECTIVE EACH ADDITIONAL VESSEL  09/06/2020   IR ANGIOGRAM SELECTIVE EACH ADDITIONAL VESSEL  09/06/2020   IR EMBO TUMOR ORGAN ISCHEMIA INFARCT INC GUIDE ROADMAPPING  09/06/2020   IR RADIOLOGIST EVAL & MGMT  07/11/2020   IR RADIOLOGIST EVAL & MGMT  09/19/2020   IR RADIOLOGIST EVAL & MGMT  05/08/2021   IR US GUIDE VASC ACCESS LEFT  09/06/2020   KNEE SURGERY     LAPAROSCOPIC GASTRIC RESTRICTIVE DUODENAL PROCEDURE (DUODENAL SWITCH)     LAPAROSCOPY N/A 08/08/2022   Procedure: LAPAROSCOPY DIAGNOSTIC;  Surgeon: Quentin Ore, MD;  Location: [facility] OR;  Service: General;  Laterality: N/A;   LAPAROTOMY N/A 08/08/2022   Procedure: EXPLORATORY LAPAROTOMY;  Surgeon: Quentin Ore, MD;  Location: [facility] OR;  Service: General;  Laterality: N/A;   WRIST FRACTURE SURGERY     Patient Active Problem List   Diagnosis Date Noted   Abnormal LFTs 11/13/2023   History of cholecystectomy 11/13/2023   Dilation of biliary tract 11/13/2023   Liver lesion 11/13/2023   Abnormal CT scan 11/13/2023   Iron deficiency anemia 11/13/2023   Hiatal hernia 11/13/2023   Colon cancer screening 11/13/2023   Tongue pain 11/13/2023   Pancreatitis 06/24/2023   S/P hernia surgery 08/19/2022   S/P small bowel resection 08/19/2022   Hospital discharge follow-up 08/19/2022   Fibroid 09/06/2020   Benign neoplasm of connective and other soft tissue, unspecified 09/06/2020   Defecation symptom 07/02/2020   Rectal pain 06/04/2020   Anal fissure 04/05/2020    Hemorrhoids 04/05/2020   Dysmenorrhea 02/07/2020   Polycystic ovary syndrome 02/07/2020   Status post biliopancreatic diversion with duodenal switch 01/17/2020   Excessive daytime sleepiness 01/17/2020   Hypersomnia with sleep apnea 01/17/2020   Loud snoring 01/17/2020   Morbid obesity with body mass index of 50 or higher (HCC) 01/17/2020   Morbid obesity with BMI of 40.0-44.9, adult (HCC) 09/15/2019   Arm numbness 03/08/2018   Anxiety state 04/11/2014   Intramural leiomyoma of uterus 11/03/2013   Menorrhagia 11/03/2013   Knee pain 05/28/2013   Osteoarthrosis involving lower leg 05/06/2011   EDEMA 02/04/2011   CHEST PAIN-UNSPECIFIED 02/04/2011   Vitamin D deficiency 12/03/2009   Allergic rhinitis 12/03/2009   Morbid obesity (HCC) 10/31/2009   Benign essential hypertension 10/31/2009   BLOCK, OTHER HEART 10/31/2009   URI 10/31/2009   Gastro-esophageal reflux disease without esophagitis 10/31/2009   SLEEP APNEA 10/31/2009   PALPITATIONS 10/31/2009   1st degree AV block 10/31/2009   Major depressive disorder, single episode, unspecified 10/31/2009   Fatty liver disease, nonalcoholic 12/22/2008   Chronic joint pain 12/22/2008   Low back pain 04/06/1999   Depressive disorder 04/05/1994   Chronic post-traumatic stress disorder (PTSD) 04/05/1994     REFERRING PROVIDER:  Huel Cote, MD   REFERRING DIAG:  [phone] (ICD-10-CM) - Chronic pain of right knee     Rationale for Evaluation and Treatment: Rehabilitation  THERAPY DIAG:  No diagnosis found.  ONSET DATE: several years on and off   SUBJECTIVE:  SUBJECTIVE STATEMENT: Pt reports compliance with her HEP. She states that she has been having DOM's but no increase in pain. No knee buckling recently.   Off and on for several  years. Just moved into a townhouse with a lot of stairs. Started working out a lot. Doing elliptical and bike. Right knee tends to hurt on the lateral side, left knee wraps anteriorly and then medially (but is rare). Right hip pain but I think it's the way I am sleeping. Rt knee occasionally buckles. Descending stairs usually is the only way that hurts unless I aggravate it. Dont notice a difference when wearing tennis shoes vs crocs but always wears shoes.    PERTINENT HISTORY:  Chronic nature, August 2023 major abdominal surgery  PAIN:  Are you having pain? Yes: NPRS scale: no pain at rest/10 Pain location: bilateral knee beginning laterally Pain description: sore ache Aggravating factors: descending stairs Relieving factors: braces when working out  PRECAUTIONS:  None  RED FLAGS: None   WEIGHT BEARING RESTRICTIONS:  No  FALLS:  Has patient fallen in last 6 months? 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   PLOF:  Independent  PATIENT GOALS:  Descend stairs without knees hurting.    OBJECTIVE:  Note: Objective measures were completed at Evaluation unless otherwise noted.  DIAGNOSTIC FINDINGS:  DG bil knee 09/16/23:Postsurgical and degenerative changes on the left. Unremarkable appearance on the right.   PATIENT SURVEYS:  FOTO 53   GAIT:  Comments: bilateral calcaneal eversion in stance phase     TREATMENT:         Date: 11/07/2023: Recumbent bike lvl 2, 5 min Sidelying hip burner series Ascend stairs- glut set Descend stairs- reducing  valgus                   Leg press 112lbs double  SL leg press 75lbs Heel touches off step 15x each side  1 lap around clinic  Free squats at bar Heel raises DL, two up, one down  Step up 10x each leg Lateral step up 5x each leg                                                                                                 DATE: eval 11/4 Sidelying hip burner series Ascend stairs- glut  set Descend stairs- reducing valgus     PATIENT EDUCATION:  Education details: Anatomy of condition, POC, HEP, exercise form/rationale Person educated: Patient Education method: Explanation, Demonstration, Tactile cues, Verbal cues, and Handouts Education comprehension: verbalized understanding, returned demonstration, verbal cues required, tactile cues required, and needs further education  HOME EXERCISE PROGRAM: [email or web address]   ASSESSMENT:  CLINICAL IMPRESSION: Patient presents to first f/u appt with no new reports. Session with focus on LE strengthening with increased fatigue noted and reported. Pt tolerated session well, with continued weakness noted with stairs. Pt encouraged to not wear knee braces while working out if possible. She departs with no increase in symptoms, but noted muscle fatigue. Pt will continue to benefit from skilled PT to address continued deficits.    REHAB POTENTIAL:  Good  CLINICAL DECISION MAKING: Stable/uncomplicated  EVALUATION COMPLEXITY: Low   GOALS: Goals reviewed with patient? Yes  SHORT TERM GOALS: Target date: 11/23  Demo glut set with ascending stairs Baseline: Goal status: INITIAL  2.  Knee valgus control in descending stairs Baseline:  Goal status: INITIAL   LONG TERM GOALS: Target date: POC date  Meet FOTO goal Baseline:  Goal status: INITIAL  2.  Independent in long term progression of core stability safe with diastasis recti Baseline:  Goal status: INITIAL  3.  SLS with good balance control and without pain on stable and unstable surfaces Baseline:  Goal status: INITIAL    PLAN:  PT FREQUENCY: 1x/week  PT DURATION: 12 weeks  PLANNED INTERVENTIONS: 97164- PT Re-evaluation, 97110-Therapeutic exercises, 97530- Therapeutic activity, 97112- Neuromuscular re-education, 97535- Self Care, 13244- Manual therapy, [id number]- Gait training, [phone]- Aquatic Therapy, [phone]- Ultrasound, [phone]- Ionotophoresis 4mg /ml Dexamethasone,  Patient/Family education, Balance training, Stair training, Taping, Dry Needling, Spinal mobilization, Cryotherapy, and Moist heat.  PLAN FOR NEXT SESSION: progress [place] hip abd strength, continue with core progression   Royal Hawthorn PT, DPT 12/11/23  10:38 AM

## 2023-12-12 ENCOUNTER — Ambulatory Visit (HOSPITAL_BASED_OUTPATIENT_CLINIC_OR_DEPARTMENT_OTHER): Payer: No Typology Code available for payment source | Admitting: Physical Therapy

## 2023-12-12 ENCOUNTER — Encounter: Payer: Self-pay | Admitting: Family Medicine

## 2023-12-12 ENCOUNTER — Encounter (HOSPITAL_BASED_OUTPATIENT_CLINIC_OR_DEPARTMENT_OTHER): Payer: Self-pay

## 2023-12-14 NOTE — Therapy (Signed)
OUTPATIENT PHYSICAL THERAPY TREATMENT   Patient Name: Caitlain Lohr MRN: [id number] DOB:08-12-1976, 47 y.o., female Today's Date: 12/19/2023  END OF SESSION:  PT End of Session - 12/19/23 0835     Visit Number 3    Number of Visits 13    Date for PT Re-Evaluation 01/23/24    PT Start Time 0837    PT Stop Time 0910    PT Time Calculation (min) 33 min    Activity Tolerance Patient tolerated treatment well    Behavior During Therapy [facility] for tasks assessed/performed               Past Medical History:  Diagnosis Date   GERD (gastroesophageal reflux disease)    Hypertension    Past Surgical History:  Procedure Laterality Date   CESAREAN SECTION     CHOLECYSTECTOMY     GASTRECTOMY     IR ANGIOGRAM PELVIS SELECTIVE OR SUPRASELECTIVE  09/06/2020   IR ANGIOGRAM SELECTIVE EACH ADDITIONAL VESSEL  09/06/2020   IR ANGIOGRAM SELECTIVE EACH ADDITIONAL VESSEL  09/06/2020   IR EMBO TUMOR ORGAN ISCHEMIA INFARCT INC GUIDE ROADMAPPING  09/06/2020   IR RADIOLOGIST EVAL & MGMT  07/11/2020   IR RADIOLOGIST EVAL & MGMT  09/19/2020   IR RADIOLOGIST EVAL & MGMT  05/08/2021   IR US GUIDE VASC ACCESS LEFT  09/06/2020   KNEE SURGERY     LAPAROSCOPIC GASTRIC RESTRICTIVE DUODENAL PROCEDURE (DUODENAL SWITCH)     LAPAROSCOPY N/A 08/08/2022   Procedure: LAPAROSCOPY DIAGNOSTIC;  Surgeon: Quentin Ore, MD;  Location: [facility] OR;  Service: General;  Laterality: N/A;   LAPAROTOMY N/A 08/08/2022   Procedure: EXPLORATORY LAPAROTOMY;  Surgeon: Quentin Ore, MD;  Location: [facility];  Service: General;  Laterality: N/A;   WRIST FRACTURE SURGERY     Patient Active Problem List   Diagnosis Date Noted   Abnormal LFTs 11/13/2023   History of cholecystectomy 11/13/2023   Dilation of biliary tract 11/13/2023   Liver lesion 11/13/2023   Abnormal CT scan 11/13/2023   Iron deficiency anemia 11/13/2023   Hiatal hernia 11/13/2023   Colon cancer screening 11/13/2023   Tongue pain 11/13/2023    Pancreatitis 06/24/2023   S/P hernia surgery 08/19/2022   S/P small bowel resection 08/19/2022   Hospital discharge follow-up 08/19/2022   Fibroid 09/06/2020   Benign neoplasm of connective and other soft tissue, unspecified 09/06/2020   Defecation symptom 07/02/2020   Rectal pain 06/04/2020   Anal fissure 04/05/2020   Hemorrhoids 04/05/2020   Dysmenorrhea 02/07/2020   Polycystic ovary syndrome 02/07/2020   Status post biliopancreatic diversion with duodenal switch 01/17/2020   Excessive daytime sleepiness 01/17/2020   Hypersomnia with sleep apnea 01/17/2020   Loud snoring 01/17/2020   Morbid obesity with body mass index of 50 or higher (HCC) 01/17/2020   Morbid obesity with BMI of 40.0-44.9, adult (HCC) 09/15/2019   Arm numbness 03/08/2018   Anxiety state 04/11/2014   Intramural leiomyoma of uterus 11/03/2013   Menorrhagia 11/03/2013   Knee pain 05/28/2013   Osteoarthrosis involving lower leg 05/06/2011   EDEMA 02/04/2011   CHEST PAIN-UNSPECIFIED 02/04/2011   Vitamin D deficiency 12/03/2009   Allergic rhinitis 12/03/2009   Morbid obesity (HCC) 10/31/2009   Benign essential hypertension 10/31/2009   BLOCK, OTHER HEART 10/31/2009   URI 10/31/2009   Gastro-esophageal reflux disease without esophagitis 10/31/2009   SLEEP APNEA 10/31/2009   PALPITATIONS 10/31/2009   1st degree AV block 10/31/2009   Major depressive disorder, single episode, unspecified 10/31/2009  Fatty liver disease, nonalcoholic 12/22/2008   Chronic joint pain 12/22/2008   Low back pain 04/06/1999   Depressive disorder 04/05/1994   Chronic post-traumatic stress disorder (PTSD) 04/05/1994     REFERRING PROVIDER:  Huel Cote, MD   REFERRING DIAG:  [phone] (ICD-10-CM) - Chronic pain of right knee     Rationale for Evaluation and Treatment: Rehabilitation  THERAPY DIAG:  Chronic pain of right knee  Chronic pain of left knee  Muscle weakness (generalized)  ONSET DATE: several years  on and off   SUBJECTIVE:                                                                                                                                                                                           SUBJECTIVE STATEMENT: Pt states she has been having minimal pain in her knees over the holidays. She reports compliance with her HEP.   Off and on for several years. Just moved into a townhouse with a lot of stairs. Started working out a lot. Doing elliptical and bike. Right knee tends to hurt on the lateral side, left knee wraps anteriorly and then medially (but is rare). Right hip pain but I think it's the way I am sleeping. Rt knee occasionally buckles. Descending stairs usually is the only way that hurts unless I aggravate it. Dont notice a difference when wearing tennis shoes vs crocs but always wears shoes.    PERTINENT HISTORY:  Chronic nature, August 2023 major abdominal surgery  PAIN:  Are you having pain? Yes: NPRS scale: no pain at rest/10 Pain location: bilateral knee beginning laterally Pain description: sore ache Aggravating factors: descending stairs Relieving factors: braces when working out  PRECAUTIONS:  None  RED FLAGS: None   WEIGHT BEARING RESTRICTIONS:  No  FALLS:  Has patient fallen in last 6 months? 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   PLOF:  Independent  PATIENT GOALS:  Descend stairs without knees hurting.    OBJECTIVE:  Note: Objective measures were completed at Evaluation unless otherwise noted.  DIAGNOSTIC FINDINGS:  DG bil knee 09/16/23:Postsurgical and degenerative changes on the left. Unremarkable appearance on the right.   PATIENT SURVEYS:  FOTO 53   GAIT:  Comments: bilateral calcaneal eversion in stance phase     TREATMENT:       Date: 11/19/2023: Recumbent bike lvl 2, 5 min Double leg press 100lbs  SL leg press 75lbs  Sidelying hip burner series 1 lap around clinic  Heel raises DL, two  up, one down  Ascend stairs- glut set Descend stairs- reducing  valgus  Sit to stand with 15lb kettle bell SL stance on firm and unstable surface.  Date: 11/07/2023: Recumbent bike lvl 2, 5 min Sidelying hip burner series Ascend stairs- glut set Descend stairs- reducing  valgus                   Leg press 112lbs double  SL leg press 75lbs Heel touches off step 15x each side  1 lap around clinic  Free squats at bar Heel raises DL, two up, one down  Step up 10x each leg Lateral step up 5x each leg                                                                                                 DATE: eval 11/4 Sidelying hip burner series Ascend stairs- glut set Descend stairs- reducing valgus     PATIENT EDUCATION:  Education details: Anatomy of condition, POC, HEP, exercise form/rationale Person educated: Patient Education method: Explanation, Demonstration, Tactile cues, Verbal cues, and Handouts Education comprehension: verbalized understanding, returned demonstration, verbal cues required, tactile cues required, and needs further education  HOME EXERCISE PROGRAM: [email or web address]   ASSESSMENT:  CLINICAL IMPRESSION: Pt presents to PT with reports of minimal noted pain in her knees the past few days. She has been compliant with her HEP and has noted significant improvements since starting PT. Pt demonstrates good balance, strength and functional mobility for daily tasks. Discussed looking into a walking treadmill for under standing desk. Also discussed possibility of discharging next session after further review of dynamic balance and core strength. She fatigues early with glute burners, but is able to push through and finish sets with good form. Pt is active at the gym and feels confident with self HEP program.    REHAB POTENTIAL: Good  CLINICAL DECISION MAKING: Stable/uncomplicated  EVALUATION COMPLEXITY: Low   GOALS: Goals reviewed with patient?  Yes  SHORT TERM GOALS: Target date: 11/23  Demo glut set with ascending stairs Baseline: Goal status: INITIAL  2.  Knee valgus control in descending stairs Baseline:  Goal status: INITIAL   LONG TERM GOALS: Target date: POC date  Meet FOTO goal Baseline:  Goal status: INITIAL  2.  Independent in long term progression of core stability safe with diastasis recti Baseline:  Goal status: INITIAL  3.  SLS with good balance control and without pain on stable and unstable surfaces Baseline:  Goal status: INITIAL    PLAN:  PT FREQUENCY: 1x/week  PT DURATION: 12 weeks  PLANNED INTERVENTIONS: 97164- PT Re-evaluation, 97110-Therapeutic exercises, 97530- Therapeutic activity, 97112- Neuromuscular re-education, 97535- Self Care, 40981- Manual therapy, [id number]- Gait training, [phone]- Aquatic Therapy, [phone]- Ultrasound, 21308- Ionotophoresis 4mg /ml Dexamethasone, Patient/Family education, Balance training, Stair training, Taping, Dry Needling, Spinal mobilization, Cryotherapy, and Moist heat.  PLAN FOR NEXT SESSION: progress [place] hip abd strength, continue with core progression   Royal Hawthorn PT, DPT 12/19/23  9:56 AM

## 2023-12-19 ENCOUNTER — Ambulatory Visit (HOSPITAL_BASED_OUTPATIENT_CLINIC_OR_DEPARTMENT_OTHER): Payer: No Typology Code available for payment source | Attending: Orthopaedic Surgery | Admitting: Physical Therapy

## 2023-12-19 ENCOUNTER — Encounter (HOSPITAL_BASED_OUTPATIENT_CLINIC_OR_DEPARTMENT_OTHER): Payer: Self-pay | Admitting: Physical Therapy

## 2023-12-19 DIAGNOSIS — M25562 Pain in left knee: Secondary | ICD-10-CM | POA: Insufficient documentation

## 2023-12-19 DIAGNOSIS — M6281 Muscle weakness (generalized): Secondary | ICD-10-CM | POA: Diagnosis present

## 2023-12-19 DIAGNOSIS — G8929 Other chronic pain: Secondary | ICD-10-CM | POA: Insufficient documentation

## 2023-12-19 DIAGNOSIS — M25561 Pain in right knee: Secondary | ICD-10-CM | POA: Diagnosis present

## 2023-12-25 ENCOUNTER — Encounter: Payer: Self-pay | Admitting: Gastroenterology

## 2023-12-25 DIAGNOSIS — K769 Liver disease, unspecified: Secondary | ICD-10-CM

## 2023-12-25 DIAGNOSIS — R9389 Abnormal findings on diagnostic imaging of other specified body structures: Secondary | ICD-10-CM

## 2023-12-25 DIAGNOSIS — D509 Iron deficiency anemia, unspecified: Secondary | ICD-10-CM

## 2023-12-25 DIAGNOSIS — R7989 Other specified abnormal findings of blood chemistry: Secondary | ICD-10-CM

## 2023-12-25 DIAGNOSIS — K838 Other specified diseases of biliary tract: Secondary | ICD-10-CM

## 2023-12-26 ENCOUNTER — Encounter (HOSPITAL_BASED_OUTPATIENT_CLINIC_OR_DEPARTMENT_OTHER): Payer: Self-pay | Admitting: Physical Therapy

## 2023-12-26 ENCOUNTER — Ambulatory Visit (HOSPITAL_BASED_OUTPATIENT_CLINIC_OR_DEPARTMENT_OTHER): Payer: No Typology Code available for payment source | Attending: Orthopaedic Surgery | Admitting: Physical Therapy

## 2023-12-26 DIAGNOSIS — G8929 Other chronic pain: Secondary | ICD-10-CM

## 2023-12-26 DIAGNOSIS — M25562 Pain in left knee: Secondary | ICD-10-CM | POA: Insufficient documentation

## 2023-12-26 DIAGNOSIS — M25561 Pain in right knee: Secondary | ICD-10-CM | POA: Diagnosis present

## 2023-12-26 DIAGNOSIS — M6281 Muscle weakness (generalized): Secondary | ICD-10-CM | POA: Diagnosis present

## 2023-12-26 NOTE — Therapy (Signed)
 OUTPATIENT PHYSICAL THERAPY discharge   Patient Name: Krista Fisher MRN: [id number] DOB:1976-01-10, 48 y.o., female Today's Date: 12/26/2023  END OF SESSION:  PT End of Session - 12/26/23 0838     Visit Number 4    Number of Visits 13    Date for PT Re-Evaluation 01/23/24    PT Start Time 0837    PT Stop Time 0915    PT Time Calculation (min) 38 min    Activity Tolerance Patient tolerated treatment well    Behavior During Therapy [facility] for tasks assessed/performed                Past Medical History:  Diagnosis Date   GERD (gastroesophageal reflux disease)    Hypertension    Past Surgical History:  Procedure Laterality Date   CESAREAN SECTION     CHOLECYSTECTOMY     GASTRECTOMY     IR ANGIOGRAM PELVIS SELECTIVE OR SUPRASELECTIVE  09/06/2020   IR ANGIOGRAM SELECTIVE EACH ADDITIONAL VESSEL  09/06/2020   IR ANGIOGRAM SELECTIVE EACH ADDITIONAL VESSEL  09/06/2020   IR EMBO TUMOR ORGAN ISCHEMIA INFARCT INC GUIDE ROADMAPPING  09/06/2020   IR RADIOLOGIST EVAL & MGMT  07/11/2020   IR RADIOLOGIST EVAL & MGMT  09/19/2020   IR RADIOLOGIST EVAL & MGMT  05/08/2021   IR US  GUIDE VASC ACCESS LEFT  09/06/2020   KNEE SURGERY     LAPAROSCOPIC GASTRIC RESTRICTIVE DUODENAL PROCEDURE (DUODENAL SWITCH)     LAPAROSCOPY N/A 08/08/2022   Procedure: LAPAROSCOPY DIAGNOSTIC;  Surgeon: Lyndel Deward PARAS, MD;  Location: [facility] OR;  Service: General;  Laterality: N/A;   LAPAROTOMY N/A 08/08/2022   Procedure: EXPLORATORY LAPAROTOMY;  Surgeon: Lyndel Deward PARAS, MD;  Location: [facility] OR;  Service: General;  Laterality: N/A;   WRIST FRACTURE SURGERY     Patient Active Problem List   Diagnosis Date Noted   Abnormal LFTs 11/13/2023   History of cholecystectomy 11/13/2023   Dilation of biliary tract 11/13/2023   Liver lesion 11/13/2023   Abnormal CT scan 11/13/2023   Iron  deficiency anemia 11/13/2023   Hiatal hernia 11/13/2023   Colon cancer screening 11/13/2023   Tongue pain 11/13/2023    Pancreatitis 06/24/2023   S/P hernia surgery 08/19/2022   S/P small bowel resection 08/19/2022   Hospital discharge follow-up 08/19/2022   Fibroid 09/06/2020   Benign neoplasm of connective and other soft tissue, unspecified 09/06/2020   Defecation symptom 07/02/2020   Rectal pain 06/04/2020   Anal fissure 04/05/2020   Hemorrhoids 04/05/2020   Dysmenorrhea 02/07/2020   Polycystic ovary syndrome 02/07/2020   Status post biliopancreatic diversion with duodenal switch 01/17/2020   Excessive daytime sleepiness 01/17/2020   Hypersomnia with sleep apnea 01/17/2020   Loud snoring 01/17/2020   Morbid obesity with body mass index of 50 or higher (HCC) 01/17/2020   Morbid obesity with BMI of 40.0-44.9, adult (HCC) 09/15/2019   Arm numbness 03/08/2018   Anxiety state 04/11/2014   Intramural leiomyoma of uterus 11/03/2013   Menorrhagia 11/03/2013   Knee pain 05/28/2013   Osteoarthrosis involving lower leg 05/06/2011   EDEMA 02/04/2011   CHEST PAIN-UNSPECIFIED 02/04/2011   Vitamin D  deficiency 12/03/2009   Allergic rhinitis 12/03/2009   Morbid obesity (HCC) 10/31/2009   Benign essential hypertension 10/31/2009   BLOCK, OTHER HEART 10/31/2009   URI 10/31/2009   Gastro-esophageal reflux disease without esophagitis 10/31/2009   SLEEP APNEA 10/31/2009   PALPITATIONS 10/31/2009   1st degree AV block 10/31/2009   Major depressive disorder, single episode, unspecified 10/31/2009  Fatty liver disease, nonalcoholic 12/22/2008   Chronic joint pain 12/22/2008   Low back pain 04/06/1999   Depressive disorder 04/05/1994   Chronic post-traumatic stress disorder (PTSD) 04/05/1994     REFERRING PROVIDER:  Genelle Standing, MD   REFERRING DIAG:  [phone] (ICD-10-CM) - Chronic pain of right knee     Rationale for Evaluation and Treatment: Rehabilitation  THERAPY DIAG:  Chronic pain of right knee  Chronic pain of left knee  Muscle weakness (generalized)  ONSET DATE: several years  on and off   SUBJECTIVE:                                                                                                                                                                                           SUBJECTIVE STATEMENT: Denies pain.   Off and on for several years. Just moved into a townhouse with a lot of stairs. Started working out a lot. Doing elliptical and bike. Right knee tends to hurt on the lateral side, left knee wraps anteriorly and then medially (but is rare). Right hip pain but I think it's the way I am sleeping. Rt knee occasionally buckles. Descending stairs usually is the only way that hurts unless I aggravate it. Dont notice a difference when wearing tennis shoes vs crocs but always wears shoes.    PERTINENT HISTORY:  Chronic nature, August 2023 major abdominal surgery  PAIN:  Are you having pain? Yes: NPRS scale: no pain at rest/10 Pain location: bilateral knee beginning laterally Pain description: sore ache Aggravating factors: descending stairs Relieving factors: braces when working out  PRECAUTIONS:  None  RED FLAGS: None   WEIGHT BEARING RESTRICTIONS:  No  FALLS:  Has patient fallen in last 6 months? 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   PLOF:  Independent  PATIENT GOALS:  Descend stairs without knees hurting.    OBJECTIVE:  Note: Objective measures were completed at Evaluation unless otherwise noted.  DIAGNOSTIC FINDINGS:  DG bil knee 09/16/23:Postsurgical and degenerative changes on the left. Unremarkable appearance on the right.   PATIENT SURVEYS:  FOTO 53 12/26/23: 71   GAIT:  Comments: bilateral calcaneal eversion in stance phase     TREATMENT:        Treatment                            12/26/23:  See home exercise program for reviewed exercises  Date: 11/19/2023: Recumbent bike lvl 2, 5 min Double leg press 100lbs  SL leg press 75lbs  Sidelying hip burner series 1 lap around clinic  Heel  raises DL, two up, one down  Ascend stairs- glut set Descend stairs- reducing  valgus                   Sit to stand with 15lb kettle bell SL stance on firm and unstable surface.  Date: 11/07/2023: Recumbent bike lvl 2, 5 min Sidelying hip burner series Ascend stairs- glut set Descend stairs- reducing  valgus                   Leg press 112lbs double  SL leg press 75lbs Heel touches off step 15x each side  1 lap around clinic  Free squats at bar Heel raises DL, two up, one down  Step up 10x each leg Lateral step up 5x each leg                                                                                                 DATE: eval 11/4 Sidelying hip burner series Ascend stairs- glut set Descend stairs- reducing valgus     PATIENT EDUCATION:  Education details: Anatomy of condition, POC, HEP, exercise form/rationale Person educated: Patient Education method: Explanation, Demonstration, Tactile cues, Verbal cues, and Handouts Education comprehension: verbalized understanding, returned demonstration, verbal cues required, tactile cues required, and needs further education  HOME EXERCISE PROGRAM: [email or web address]   ASSESSMENT:  CLINICAL IMPRESSION: Patient has met all of her goals at this time is prepared for discharge to independent gym program.  Exercises today focused on core stability progression and recognition of diastases recti and control of muscular activation.  Will continue to utilize abdominal binder for comfort and stability as she progresses through core strengthening.  Encouraged her to reach out with any further questions.    REHAB POTENTIAL: Good  CLINICAL DECISION MAKING: Stable/uncomplicated  EVALUATION COMPLEXITY: Low   GOALS: Goals reviewed with patient? Yes  SHORT TERM GOALS: Target date: 11/23  Demo glut set with ascending stairs Baseline: Goal status: achieved  2.  Knee valgus control in descending stairs Baseline:  Goal status:  achieved   LONG TERM GOALS: Target date: POC date  Meet FOTO goal Baseline:  Goal status: achieved  2.  Independent in long term progression of core stability safe with diastasis recti Baseline:  Goal status: achieved  3.  SLS with good balance control and without pain on stable and unstable surfaces Baseline:  Goal status: achieved    PLAN:  PT FREQUENCY: 1x/week  PT DURATION: 12 weeks  PLANNED INTERVENTIONS: 97164- PT Re-evaluation, 97110-Therapeutic exercises, 97530- Therapeutic activity, 97112- Neuromuscular re-education, 97535- Self Care, 02859- Manual therapy, [id number]- Gait training, [phone]- Aquatic Therapy, [phone]- Ultrasound, 02966- Ionotophoresis 4mg /ml Dexamethasone, Patient/Family education, Balance training, Stair training, Taping, Dry Needling, Spinal mobilization, Cryotherapy, and Moist heat.  PLAN FOR NEXT SESSION: progress [place] hip abd strength, continue with core progression  Sammuel Blick C. Allani Reber PT, DPT 12/26/23 9:46 AM

## 2023-12-29 ENCOUNTER — Other Ambulatory Visit: Payer: Self-pay

## 2023-12-29 ENCOUNTER — Other Ambulatory Visit (HOSPITAL_BASED_OUTPATIENT_CLINIC_OR_DEPARTMENT_OTHER): Payer: Self-pay

## 2023-12-29 MED ORDER — PHENTERMINE HCL 37.5 MG PO TABS
37.5000 mg | ORAL_TABLET | Freq: Every day | ORAL | 0 refills | Status: DC
  Filled 2023-12-29: qty 30, 30d supply, fill #0

## 2023-12-30 ENCOUNTER — Telehealth: Payer: No Typology Code available for payment source | Admitting: Family Medicine

## 2023-12-30 ENCOUNTER — Encounter: Payer: Self-pay | Admitting: Family Medicine

## 2023-12-30 ENCOUNTER — Other Ambulatory Visit (HOSPITAL_BASED_OUTPATIENT_CLINIC_OR_DEPARTMENT_OTHER): Payer: Self-pay

## 2023-12-30 DIAGNOSIS — F321 Major depressive disorder, single episode, moderate: Secondary | ICD-10-CM

## 2023-12-30 DIAGNOSIS — K838 Other specified diseases of biliary tract: Secondary | ICD-10-CM | POA: Diagnosis not present

## 2023-12-30 DIAGNOSIS — F411 Generalized anxiety disorder: Secondary | ICD-10-CM | POA: Diagnosis not present

## 2023-12-30 MED ORDER — LIOTHYRONINE SODIUM 25 MCG PO TABS
25.0000 ug | ORAL_TABLET | Freq: Every day | ORAL | 3 refills | Status: DC
  Filled 2023-12-30: qty 30, 30d supply, fill #0
  Filled 2024-02-10 – 2024-02-15 (×2): qty 30, 30d supply, fill #1

## 2023-12-30 NOTE — Progress Notes (Signed)
 Virtual Visit via Video Note  I connected with Krista Fisher on 12/30/23 at  4:00 PM EST by a video enabled telemedicine application and verified that I am speaking with the correct person using two identifiers.  Location patient: home Location provider:work or home office Persons participating in the virtual visit: patient, provider  I discussed the limitations of evaluation and management by telemedicine and the availability of in person appointments. The patient expressed understanding and agreed to proceed. Chief Complaint  Patient presents with   Medical Management of Chronic Issues     HPI: Pt is a 48 yo female seen for f/u on anxiety and depression.  Pt on lexapro  20 mg daily x yrs but not sure it is working anymore.  Anxiety has improved.  Having more of the depression symptoms.  No desire to get out of bed and do things.  Doesn't have the drive to do anything.  Afraid her work will eventually be effected by her mood.   In the past Prozac-helped but had vivid dreams and Paxil stopped working.  Hesitant to change medications.  Pt saw GI for elevated LFTs and abdominal pain.  Had MRI/MRCP.  Unable to do endoscopic u/s due to anatomy.  Pt states Dr. Wilhelmenia mentioned the possibility of adding amitriptyline to help with abdominal pain.  Pt states she is not really having abd pain.     ROS: See pertinent positives and negatives per HPI.  Past Medical History:  Diagnosis Date   GERD (gastroesophageal reflux disease)    Hypertension     Past Surgical History:  Procedure Laterality Date   CESAREAN SECTION     CHOLECYSTECTOMY     GASTRECTOMY     IR ANGIOGRAM PELVIS SELECTIVE OR SUPRASELECTIVE  09/06/2020   IR ANGIOGRAM SELECTIVE EACH ADDITIONAL VESSEL  09/06/2020   IR ANGIOGRAM SELECTIVE EACH ADDITIONAL VESSEL  09/06/2020   IR EMBO TUMOR ORGAN ISCHEMIA INFARCT INC GUIDE ROADMAPPING  09/06/2020   IR RADIOLOGIST EVAL & MGMT  07/11/2020   IR RADIOLOGIST EVAL & MGMT  09/19/2020    IR RADIOLOGIST EVAL & MGMT  05/08/2021   IR US  GUIDE VASC ACCESS LEFT  09/06/2020   KNEE SURGERY     LAPAROSCOPIC GASTRIC RESTRICTIVE DUODENAL PROCEDURE (DUODENAL SWITCH)     LAPAROSCOPY N/A 08/08/2022   Procedure: LAPAROSCOPY DIAGNOSTIC;  Surgeon: Lyndel Deward PARAS, MD;  Location: [facility] OR;  Service: General;  Laterality: N/A;   LAPAROTOMY N/A 08/08/2022   Procedure: EXPLORATORY LAPAROTOMY;  Surgeon: Lyndel Deward PARAS, MD;  Location: [facility] OR;  Service: General;  Laterality: N/A;   WRIST FRACTURE SURGERY      Family History  Problem Relation Age of Onset   Hyperthyroidism Mother    Learning disabilities Father    Hypertension Father    Hyperlipidemia Father    Heart disease Father    Heart failure Father    Diabetes Father    Multiple sclerosis Sister    Depression Paternal Uncle    Drug abuse Paternal Uncle    Early death Paternal Uncle    Diabetes Maternal Grandmother    Intellectual disability Maternal Grandmother    Hypertension Maternal Grandmother    Hyperlipidemia Maternal Grandmother    Heart disease Maternal Grandmother    Diabetes Paternal Grandmother    Learning disabilities Son    Depression Son    Colon cancer Neg Hx    Stomach cancer Neg Hx    Esophageal cancer Neg Hx    Pancreatic cancer Neg Hx  Current Outpatient Medications:    acetaminophen  (TYLENOL ) 500 MG tablet, Take 500 mg by mouth every 6 (six) hours as needed., Disp: , Rfl:    ALPRAZolam  (XANAX ) 0.5 MG tablet, Take 1 tablet (0.5 mg total dose) daily as needed for panic attacks., Disp: 15 tablet, Rfl: 0   Calcium  Carb-Cholecalciferol  600-10 MG-MCG TABS, Take 1 tablet by mouth daily., Disp: , Rfl:    cetirizine (ZYRTEC) 10 MG tablet, Take 10 mg by mouth daily., Disp: , Rfl:    COLLAGEN PO, Take 1 tablet by mouth daily. , Disp: , Rfl:    cyanocobalamin  (VITAMIN B12) 500 MCG tablet, Take 1,000 mcg by mouth daily., Disp: , Rfl:    escitalopram  (LEXAPRO ) 20 MG tablet, Take 1 tablet (20 mg total)  by mouth daily., Disp: 90 tablet, Rfl: 0   escitalopram  (LEXAPRO ) 20 MG tablet, Take 1 tablet (20 mg total) by mouth daily., Disp: 90 tablet, Rfl: 3   etonogestrel (NEXPLANON) 68 MG IMPL implant, 1 each by Subdermal route once., Disp: , Rfl:    ferrous sulfate 325 (65 FE) MG tablet, Take 325 mg by mouth daily with breakfast., Disp: , Rfl:    fluticasone  (FLONASE ) 50 MCG/ACT nasal spray, Place 1 spray into both nostrils daily., Disp: , Rfl:    Ginger, Zingiber officinalis, (GINGER PO), Take 1 tablet by mouth daily., Disp: , Rfl:    hyoscyamine  (LEVSIN  SL) 0.125 MG SL tablet, Place 1 tablet (0.125 mg total) under the tongue every 4 (four) hours as needed for cramping for upto 10 days., Disp: 30 tablet, Rfl: 0   magnesium  30 MG tablet, Take 200 mg by mouth 2 (two) times daily., Disp: , Rfl:    Multiple Vitamins-Iron  (MULTIVITAMINS WITH IRON ) TABS tablet, Take 1 tablet by mouth daily., Disp: , Rfl:    omeprazole  (PRILOSEC) 40 MG capsule, Take 1 capsule (40 mg total) by mouth in the morning before meal., Disp: 90 capsule, Rfl: 3   ondansetron  (ZOFRAN -ODT) 4 MG disintegrating tablet, Take 1 tablet (4 mg total) by mouth every 8 (eight) hours as needed for nausea or vomiting., Disp: 20 tablet, Rfl: 0   phentermine  (ADIPEX-P ) 37.5 MG tablet, Take 1 tablet (37.5 mg total) by mouth daily before breakfast., Disp: 30 tablet, Rfl: 0   phentermine  (ADIPEX-P ) 37.5 MG tablet, Take 1 tablet (37.5 mg total) by mouth daily before breakfast., Disp: 30 tablet, Rfl: 0   phentermine  (ADIPEX-P ) 37.5 MG tablet, Take 1 tablet (37.5 mg total) by mouth daily before breakfast., Disp: 30 tablet, Rfl: 0   phentermine  (ADIPEX-P ) 37.5 MG tablet, Take 1 tablet (37.5 mg total) by mouth daily before breakfast., Disp: 30 tablet, Rfl: 0   Semaglutide -Weight Management (WEGOVY ) 0.25 MG/0.5ML SOAJ, Inject 0.25 mg into the skin once a week., Disp: 2 mL, Rfl: 0   Semaglutide -Weight Management (WEGOVY ) 0.5 MG/0.5ML SOAJ, Inject 0.5 mg into the  skin once a week., Disp: 2 mL, Rfl: 0   sucralfate  (CARAFATE ) 1 g tablet, Take 1 tablet (1 g total) by mouth 3 (three) times daily before meals  Take on an empty stomach, Disp: 21 tablet, Rfl: 0   Turmeric (QC TUMERIC COMPLEX PO), Take 1 tablet by mouth daily., Disp: , Rfl:    Vitamin A  (A-25 PO), Take 1 tablet by mouth daily., Disp: , Rfl:    zinc  gluconate 50 MG tablet, Take 50 mg by mouth daily., Disp: , Rfl:    phentermine  (ADIPEX-P ) 37.5 MG tablet, Take 1 tablet (37.5 mg total) by mouth daily  before breakfast., Disp: 30 tablet, Rfl: 0  Current Facility-Administered Medications:    0.9 %  sodium chloride  infusion, 500 mL, Intravenous, Once, Mansouraty, Aloha Raddle., MD  ERASMOBETHA MOCCASIN per patient if applicable: RR between 12-20 bpm  GENERAL: alert, oriented, appears well and in no acute distress  HEENT: atraumatic, conjunctiva clear, no obvious abnormalities on inspection of external nose and ears  NECK: normal movements of the head and neck  LUNGS: on inspection no signs of respiratory distress, breathing rate appears normal, no obvious gross SOB, gasping or wheezing  CV: no obvious cyanosis  MS: moves all visible extremities without noticeable abnormality  PSYCH/NEURO: pleasant and cooperative, no obvious depression or anxiety, speech and thought processing grossly intact     12/30/2023    3:08 PM 12/03/2023    4:54 PM 09/18/2023    1:09 PM 12/10/2022    1:06 PM 10/08/2022    1:27 PM  Depression screen PHQ 2/9  Decreased Interest 3 2 2  0 0  Down, Depressed, Hopeless 3 1 2  0 1  PHQ - 2 Score 6 3 4  0 1  Altered sleeping 3 3 3 2 2   Tired, decreased energy 3 1 3 2 1   Change in appetite 0 0 1 0 1  Feeling bad or failure about yourself  0 1 1 0 0  Trouble concentrating 3 2 1  0 0  Moving slowly or fidgety/restless 0 0 0 0 0  Suicidal thoughts 0 0 0 0 0  PHQ-9 Score 15 10 13 4 5   Difficult doing work/chores  Somewhat difficult Somewhat difficult Not difficult at all Not  difficult at all      12/30/2023    3:10 PM 12/03/2023    4:55 PM 09/18/2023    1:10 PM  GAD 7 : Generalized Anxiety Score  Nervous, Anxious, on Edge 2 1 2   Control/stop worrying 2 0 2  Worry too much - different things 2 1 2   Trouble relaxing 3 1 2   Restless 0 0 0  Easily annoyed or irritable 0 0 0  Afraid - awful might happen 3 1 2   Total GAD 7 Score 12 4 10   Anxiety Difficulty  Somewhat difficult Somewhat difficult     ASSESSMENT AND PLAN:  Discussed the following assessment and plan:  GAD (generalized anxiety disorder) - Plan: liothyronine  (CYTOMEL ) 25 MCG tablet  Current moderate episode of major depressive disorder without prior episode (HCC) - Plan: liothyronine  (CYTOMEL ) 25 MCG tablet  Dilation of biliary tract  Patient with increased anxiety and depression symptoms since last office visit while on Lexapro  20 mg daily.  PHQ-9 score now 15, previously 10 and GAD-7 score now 12, previously 4.  Discussed various options including augmenting Lexapro  (with Cytomel  or Abilify) versus weaning off Lexapro  while starting a new medication such as amitriptyline.  Patient wishes to proceed with augmenting Lexapro  with Cytomel  as would notice improvement in symptoms sooner than with other options.  Patient advised to keep a diary of abdominal symptoms.  If having frequent abdominal pain consider starting amitriptyline.  Continue follow-up with GI.  Given strict precautions.   I discussed the assessment and treatment plan with the patient. The patient was provided an opportunity to ask questions and all were answered. The patient agreed with the plan and demonstrated an understanding of the instructions.   The patient was advised to call back or seek an in-person evaluation if the symptoms worsen or if the condition fails to improve as anticipated.  Clotilda JONELLE Single, MD

## 2023-12-30 NOTE — Telephone Encounter (Signed)
 Patty, Lets go ahead and have the patient repeat laboratories to see what her's happen in regards to her alkaline phosphatase. I would like alkaline phosphatase isoenzyme/fractionation to be performed as well as a CMP as well as an amylase and a lipase. As I had discussed with the patient, her duodenal switch anatomy makes endoscopic ultrasound not feasible for us  in regards to looking at the bile duct tree closely. If issues persist and/or we can find that she has elevations in her liver labs and/or pancreas labs and episodes of severe right upper quadrant pain, then it is possible we can consider the role of device assisted ERCP to try to evaluate her bile duct tomorrow closely and consider sphincterotomy as if she had functional disorder of the sphincter. However, again her anatomy makes any attempt at ERCP much more difficult and without significant evidence of those labs being abnormal, we could with her through a referral for a procedure that just would not help her as well and may have risks associated with it including increased pancreatitis. I will send a message to Dr. Mercer to see if she may be okay with me considering TCA initiation for the potential of neuromodulation for functional GI pain, as the patient is already on Celexa she may not have comfort ability with us  adding that on but I will ask Dr. Mercer her PCP separately about this and that may be another consideration for pain evaluation. She is always free to have a secondary GI evaluation in one of the quaternary centers with me remaining her primary GI if she wants to have another look as well, so please let me know if that is something she would be interested in as well. Thanks. GM

## 2023-12-30 NOTE — Addendum Note (Signed)
 Addended by: Loretha Stapler on: 12/30/2023 12:30 PM   Modules accepted: Orders

## 2023-12-31 ENCOUNTER — Other Ambulatory Visit (INDEPENDENT_AMBULATORY_CARE_PROVIDER_SITE_OTHER): Payer: No Typology Code available for payment source

## 2023-12-31 DIAGNOSIS — K838 Other specified diseases of biliary tract: Secondary | ICD-10-CM | POA: Diagnosis not present

## 2023-12-31 DIAGNOSIS — K769 Liver disease, unspecified: Secondary | ICD-10-CM | POA: Diagnosis not present

## 2023-12-31 DIAGNOSIS — R7989 Other specified abnormal findings of blood chemistry: Secondary | ICD-10-CM

## 2023-12-31 DIAGNOSIS — D509 Iron deficiency anemia, unspecified: Secondary | ICD-10-CM

## 2023-12-31 DIAGNOSIS — R9389 Abnormal findings on diagnostic imaging of other specified body structures: Secondary | ICD-10-CM

## 2023-12-31 LAB — COMPREHENSIVE METABOLIC PANEL
ALT: 14 U/L (ref 0–35)
AST: 23 U/L (ref 0–37)
Albumin: 4 g/dL (ref 3.5–5.2)
Alkaline Phosphatase: 116 U/L (ref 39–117)
BUN: 9 mg/dL (ref 6–23)
CO2: 25 meq/L (ref 19–32)
Calcium: 9.2 mg/dL (ref 8.4–10.5)
Chloride: 106 meq/L (ref 96–112)
Creatinine, Ser: 0.82 mg/dL (ref 0.40–1.20)
GFR: 84.79 mL/min (ref >60.00)
Glucose, Bld: 90 mg/dL (ref 70–99)
Potassium: 4.1 meq/L (ref 3.5–5.1)
Sodium: 141 meq/L (ref 135–145)
Total Bilirubin: 0.5 mg/dL (ref 0.2–1.2)
Total Protein: 7.4 g/dL (ref 6.0–8.3)

## 2023-12-31 LAB — LIPASE: Lipase: 47 U/L (ref 11.0–59.0)

## 2023-12-31 LAB — AMYLASE: Amylase: 52 U/L (ref 27–131)

## 2024-01-01 ENCOUNTER — Telehealth: Payer: No Typology Code available for payment source | Admitting: Family Medicine

## 2024-01-01 NOTE — Telephone Encounter (Signed)
 Krista Fisher, No problem add a zinc  level to the next set of labs that she has drawn. You may also let her know that Dr. Mercer and I discussed her case.  And I agree with Dr. Mercer thoughtful process of working with the patient and medication adjustments that she has made and prescribed. Thanks. GM

## 2024-01-01 NOTE — Addendum Note (Signed)
 Addended by: Hilma Favors L on: 01/01/2024 01:00 PM   Modules accepted: Orders

## 2024-01-02 ENCOUNTER — Encounter (HOSPITAL_BASED_OUTPATIENT_CLINIC_OR_DEPARTMENT_OTHER): Payer: No Typology Code available for payment source | Admitting: Rehabilitative and Restorative Service Providers"

## 2024-01-04 LAB — ALKALINE PHOSPHATASE ISOENZYMES
Alkaline phosphatase (APISO): 118 U/L (ref 31–125)
Bone Isoenzymes: 39 % (ref 28–66)
Intestinal Isoenzymes: 5 % (ref 1–24)
Liver Isoenzymes: 57 % (ref 25–69)
Macrohepatic isoenzymes: 0 % (ref <=0)
Placental isoenzymes: 0 % (ref <=0)

## 2024-01-07 ENCOUNTER — Encounter (HOSPITAL_BASED_OUTPATIENT_CLINIC_OR_DEPARTMENT_OTHER): Payer: Self-pay

## 2024-01-07 ENCOUNTER — Other Ambulatory Visit (HOSPITAL_BASED_OUTPATIENT_CLINIC_OR_DEPARTMENT_OTHER): Payer: Self-pay

## 2024-01-07 MED ORDER — ZEPBOUND 2.5 MG/0.5ML ~~LOC~~ SOAJ
2.5000 mg | SUBCUTANEOUS | 0 refills | Status: DC
  Filled 2024-01-07: qty 2, 28d supply, fill #0

## 2024-01-09 ENCOUNTER — Encounter (HOSPITAL_BASED_OUTPATIENT_CLINIC_OR_DEPARTMENT_OTHER): Payer: No Typology Code available for payment source | Admitting: Physical Therapy

## 2024-01-16 ENCOUNTER — Encounter (HOSPITAL_BASED_OUTPATIENT_CLINIC_OR_DEPARTMENT_OTHER): Payer: No Typology Code available for payment source | Admitting: Rehabilitative and Restorative Service Providers"

## 2024-01-23 ENCOUNTER — Encounter (HOSPITAL_BASED_OUTPATIENT_CLINIC_OR_DEPARTMENT_OTHER): Payer: No Typology Code available for payment source | Admitting: Physical Therapy

## 2024-01-30 ENCOUNTER — Encounter (HOSPITAL_BASED_OUTPATIENT_CLINIC_OR_DEPARTMENT_OTHER): Payer: No Typology Code available for payment source | Admitting: Rehabilitative and Restorative Service Providers"

## 2024-02-06 ENCOUNTER — Encounter (HOSPITAL_BASED_OUTPATIENT_CLINIC_OR_DEPARTMENT_OTHER): Payer: No Typology Code available for payment source | Admitting: Physical Therapy

## 2024-02-11 ENCOUNTER — Other Ambulatory Visit (HOSPITAL_BASED_OUTPATIENT_CLINIC_OR_DEPARTMENT_OTHER): Payer: Self-pay

## 2024-02-11 MED ORDER — WEGOVY 0.5 MG/0.5ML ~~LOC~~ SOAJ
0.5000 mg | SUBCUTANEOUS | 0 refills | Status: AC
  Filled 2024-02-11: qty 2, 28d supply, fill #0

## 2024-02-15 ENCOUNTER — Other Ambulatory Visit (HOSPITAL_BASED_OUTPATIENT_CLINIC_OR_DEPARTMENT_OTHER): Payer: Self-pay

## 2024-02-15 ENCOUNTER — Encounter: Payer: Self-pay | Admitting: Gastroenterology

## 2024-02-19 ENCOUNTER — Encounter: Payer: Self-pay | Admitting: Family Medicine

## 2024-02-25 ENCOUNTER — Other Ambulatory Visit: Payer: Self-pay | Admitting: Family Medicine

## 2024-02-25 DIAGNOSIS — F4312 Post-traumatic stress disorder, chronic: Secondary | ICD-10-CM

## 2024-02-25 DIAGNOSIS — F411 Generalized anxiety disorder: Secondary | ICD-10-CM

## 2024-02-25 MED ORDER — ALPRAZOLAM 0.5 MG PO TABS: ORAL_TABLET | ORAL | 0 refills | Status: AC

## 2024-02-26 ENCOUNTER — Ambulatory Visit: Payer: No Typology Code available for payment source | Admitting: Family Medicine

## 2024-02-26 ENCOUNTER — Ambulatory Visit: Admitting: Family Medicine

## 2024-03-03 ENCOUNTER — Ambulatory Visit: Payer: No Typology Code available for payment source | Admitting: Family Medicine

## 2024-03-11 ENCOUNTER — Telehealth: Payer: Self-pay | Admitting: Family Medicine

## 2024-03-11 NOTE — Telephone Encounter (Signed)
 Copied from CRM [id number]. Topic: General - Other >> Mar 11, 2024  3:54 PM [place] A wrote: Reason for CRM: [facility] called to follow-up on request-Agent informed them that the request was received. [facility] would like a call back at [phone] ext [phone]

## 2024-03-14 NOTE — Telephone Encounter (Signed)
 Called and left a VM to return call

## 2024-04-01 ENCOUNTER — Other Ambulatory Visit (HOSPITAL_BASED_OUTPATIENT_CLINIC_OR_DEPARTMENT_OTHER): Payer: Self-pay

## 2024-04-01 MED ORDER — ZEPBOUND 2.5 MG/0.5ML ~~LOC~~ SOAJ
2.5000 mg | SUBCUTANEOUS | 0 refills | Status: DC
Start: 2024-04-01 — End: 2024-07-28
  Filled 2024-04-01: qty 2, 28d supply, fill #0

## 2024-04-01 MED ORDER — PHENTERMINE HCL 37.5 MG PO TABS
37.5000 mg | ORAL_TABLET | Freq: Every morning | ORAL | 0 refills | Status: DC
  Filled 2024-04-01: qty 30, 30d supply, fill #0

## 2024-06-06 ENCOUNTER — Ambulatory Visit: Payer: Self-pay

## 2024-06-06 NOTE — Telephone Encounter (Signed)
 FYI Only or Action Required?: FYI only for provider  Patient was last seen in primary care on 12/30/2023 by Viola Greulich, MD. Called Nurse Triage reporting Dizziness. Symptoms began several months ago. Interventions attempted: Rest, hydration, or home remedies. Symptoms are: gradually worsening.  Triage Disposition: See Physician Within 24 Hours-symptoms have worsened and occur with every position change  Patient/caregiver understands and will follow disposition?: Yes  Copied from CRM [id number]. Topic: Clinical - Red Word Triage >> Jun 06, 2024 11:49 AM El Gravely T wrote: [facility] that prompted transfer to Nurse Triage: Patient scheduled appointment on MyChart for dizziness, and being lightheaded. Per office, called patient to be triaged due to symptoms she is having. Reason for Disposition  [1] MODERATE dizziness (e.g., interferes with normal activities) AND [2] has NOT been evaluated by doctor (or NP/PA) for this  (Exception: Dizziness caused by heat exposure, sudden standing, or poor fluid intake.)  Answer Assessment - Initial Assessment Questions 1. DESCRIPTION: Describe your dizziness.     Patient reports dizziness happens when she is changing positions. Patient is concerned that her blood pressure is dropping. Patient will have waves of dizziness that last for about 30 seconds.  2. LIGHTHEADED: Do you feel lightheaded? (e.g., somewhat faint, woozy, weak upon standing)     yes 3. VERTIGO: Do you feel like either you or the room is spinning or tilting? (i.e. vertigo)     no 4. SEVERITY: How bad is it?  Do you feel like you are going to faint? Can you stand and walk?   - MILD: Feels slightly dizzy, but walking normally.   - MODERATE: Feels unsteady when walking, but not falling; interferes with normal activities (e.g., school, work).   - SEVERE: Unable to walk without falling, or requires assistance to walk without falling; feels like passing out now.      Moderate to severe per  patient during changes of position.  5. ONSET:  When did the dizziness begin?     Started several months to a year ago.  6. AGGRAVATING FACTORS: Does anything make it worse? (e.g., standing, change in head position)     Changing positions.  7. HEART RATE: Can you tell me your heart rate? How many beats in 15 seconds?  (Note: not all patients can do this)       N/A 8. CAUSE: What do you think is causing the dizziness?     Patient is unsure if her blood pressure is dropping  9. RECURRENT SYMPTOM: Have you had dizziness before? If Yes, ask: When was the last time? What happened that time?     Yes  10. OTHER SYMPTOMS: Do you have any other symptoms? (e.g., fever, chest pain, vomiting, diarrhea, bleeding)       no 11. PREGNANCY: Is there any chance you are pregnant? When was your last menstrual period?       No Episodes of dizziness are occurring with every position change. Patient had made an appointment via MyChart for Thursday. Office called and told patient she needed to speak with Nurse Triage.  Protocols used: Dizziness - Lightheadedness-A-AH

## 2024-06-06 NOTE — Progress Notes (Signed)
 ACUTE VISIT No chief complaint on file.  HPI: Krista Fisher is a 48 y.o. female with a PMHx significant for hemorrhoids, HTN, 1st degree AV block, sleep apnea, GERD, PCOS, vitamin D  deficiency, iron  deficiency anemia, chronic pain, and depression, among many others, who is here today complaining of dizziness.   Review of Systems See other pertinent positives and negatives in HPI.  Current Outpatient Medications on File Prior to Visit  Medication Sig Dispense Refill   acetaminophen  (TYLENOL ) 500 MG tablet Take 500 mg by mouth every 6 (six) hours as needed.     ALPRAZolam  (XANAX ) 0.5 MG tablet Take 1 tablet (0.5 mg total dose) daily as needed for panic attacks. 30 tablet 0   Calcium  Carb-Cholecalciferol  600-10 MG-MCG TABS Take 1 tablet by mouth daily.     cetirizine (ZYRTEC) 10 MG tablet Take 10 mg by mouth daily.     COLLAGEN PO Take 1 tablet by mouth daily.      cyanocobalamin (VITAMIN B12) 500 MCG tablet Take 1,000 mcg by mouth daily.     escitalopram  (LEXAPRO ) 20 MG tablet Take 1 tablet (20 mg total) by mouth daily. 90 tablet 0   escitalopram  (LEXAPRO ) 20 MG tablet Take 1 tablet (20 mg total) by mouth daily. 90 tablet 3   etonogestrel (NEXPLANON) 68 MG IMPL implant 1 each by Subdermal route once.     ferrous sulfate 325 (65 FE) MG tablet Take 325 mg by mouth daily with breakfast.     fluticasone  (FLONASE ) 50 MCG/ACT nasal spray Place 1 spray into both nostrils daily.     Ginger, Zingiber officinalis, (GINGER PO) Take 1 tablet by mouth daily.     hyoscyamine  (LEVSIN  SL) 0.125 MG SL tablet Place 1 tablet (0.125 mg total) under the tongue every 4 (four) hours as needed for cramping for upto 10 days. 30 tablet 0   liothyronine  (CYTOMEL ) 25 MCG tablet Take 1 tablet (25 mcg total) by mouth daily. 30 tablet 3   magnesium  30 MG tablet Take 200 mg by mouth 2 (two) times daily.     Multiple Vitamins-Iron  (MULTIVITAMINS WITH IRON ) TABS tablet Take 1 tablet by mouth daily.     omeprazole   (PRILOSEC) 40 MG capsule Take 1 capsule (40 mg total) by mouth in the morning before meal. 90 capsule 3   ondansetron  (ZOFRAN -ODT) 4 MG disintegrating tablet Take 1 tablet (4 mg total) by mouth every 8 (eight) hours as needed for nausea or vomiting. 20 tablet 0   phentermine  (ADIPEX-P ) 37.5 MG tablet Take 1 tablet (37.5 mg total) by mouth daily before breakfast. 30 tablet 0   phentermine  (ADIPEX-P ) 37.5 MG tablet Take 1 tablet (37.5 mg total) by mouth daily before breakfast. 30 tablet 0   phentermine  (ADIPEX-P ) 37.5 MG tablet Take 1 tablet (37.5 mg total) by mouth daily before breakfast. 30 tablet 0   phentermine  (ADIPEX-P ) 37.5 MG tablet Take 1 tablet (37.5 mg total) by mouth daily before breakfast. 30 tablet 0   phentermine  (ADIPEX-P ) 37.5 MG tablet Take 1 tablet (37.5 mg total) by mouth daily before breakfast. 30 tablet 0   phentermine  (ADIPEX-P ) 37.5 MG tablet Take 1 tablet (37.5 mg total) by mouth every morning before breakfast 30 tablet 0   Semaglutide -Weight Management (WEGOVY ) 0.25 MG/0.5ML SOAJ Inject 0.25 mg into the skin once a week. 2 mL 0   Semaglutide -Weight Management (WEGOVY ) 0.5 MG/0.5ML SOAJ Inject 0.5 mg into the skin once a week. 2 mL 0   Semaglutide -Weight Management (WEGOVY ) 0.5 MG/0.5ML SOAJ  Inject 0.5 mg into the skin once a week. 2 mL 0   sucralfate  (CARAFATE ) 1 g tablet Take 1 tablet (1 g total) by mouth 3 (three) times daily before meals  Take on an empty stomach 21 tablet 0   tirzepatide  (ZEPBOUND ) 2.5 MG/0.5ML Pen Inject 2.5 mg into the skin once a week. 2 mL 0   tirzepatide  (ZEPBOUND ) 2.5 MG/0.5ML Pen Inject 2.5 mg into the skin once a week. 2 mL 0   Turmeric (QC TUMERIC COMPLEX PO) Take 1 tablet by mouth daily.     Vitamin A (A-25 PO) Take 1 tablet by mouth daily.     zinc  gluconate 50 MG tablet Take 50 mg by mouth daily.     Current Facility-Administered Medications on File Prior to Visit  Medication Dose Route Frequency Provider Last Rate Last Admin   0.9 %   sodium chloride  infusion  500 mL Intravenous Once Mansouraty, Albino Alu., MD        Past Medical History:  Diagnosis Date   GERD (gastroesophageal reflux disease)    Hypertension    Allergies  Allergen Reactions   Morphine  Other (See Comments) and Itching   Lamotrigine  Other (See Comments)    Patient had headaches, abdominal cramping   Protein Other (See Comments)   Wellbutrin Xl [Bupropion] Other (See Comments)    Abdominal pain   Latex Rash and Other (See Comments)    Burning    Social History   Socioeconomic History   Marital status: Widowed    Spouse name: Not on file   Number of children: 1   Years of education: Not on file   Highest education level: Master's degree (e.g., MA, MS, MEng, MEd, MSW, MBA)  Occupational History   Not on file  Tobacco Use   Smoking status: Never   Smokeless tobacco: Never  Vaping Use   Vaping status: Never Used  Substance and Sexual Activity   Alcohol use: No   Drug use: No   Sexual activity: Yes  Other Topics Concern   Not on file  Social History Narrative   Not on file   Social Drivers of Health   Financial Resource Strain: Low Risk  (06/06/2024)   Overall Financial Resource Strain (CARDIA)    Difficulty of Paying Living Expenses: Not hard at all  Food Insecurity: No Food Insecurity (06/06/2024)   Hunger Vital Sign    Worried About Running Out of Food in the Last Year: Never true    Ran Out of Food in the Last Year: Never true  Transportation Needs: No Transportation Needs (06/06/2024)   PRAPARE - Administrator, Civil Service (Medical): No    Lack of Transportation (Non-Medical): No  Physical Activity: Sufficiently Active (06/06/2024)   Exercise Vital Sign    Days of Exercise per Week: 5 days    Minutes of Exercise per Session: 90 min  Stress: No Stress Concern Present (06/06/2024)   [employer] of Occupational Health - Occupational Stress Questionnaire    Feeling of Stress: Only a little  Social  Connections: Moderately Integrated (06/06/2024)   Social Connection and Isolation Panel    Frequency of Communication with Friends and Family: More than three times a week    Frequency of Social Gatherings with Friends and Family: Once a week    Attends Religious Services: More than 4 times per year    Active Member of [employer] or Organizations: Yes    Attends Banker Meetings: More than 4  times per year    Marital Status: Widowed    There were no vitals filed for this visit. There is no height or weight on file to calculate BMI.  Physical Exam  ASSESSMENT AND PLAN:  Krista Fisher was seen today for dizziness.   There are no diagnoses linked to this encounter.  No follow-ups on file.  I, Fritz Jewel Wierda, acting as a scribe for Shan Valdes [place], MD., have documented all relevant documentation on the behalf of Harlowe Dowler [place], MD, as directed by  Kyira Volkert [place], MD while in the presence of Chauncy Mangiaracina [place], MD.   I, Murle Hellstrom [place], MD, have reviewed all documentation for this visit. The documentation on 06/06/24 for the exam, diagnosis, procedures, and orders are all accurate and complete.  Alyria Krack G. [place], MD  [facility]. [place] office.  Discharge Instructions   None

## 2024-06-07 ENCOUNTER — Encounter: Payer: Self-pay | Admitting: Family Medicine

## 2024-06-07 ENCOUNTER — Ambulatory Visit: Payer: Self-pay | Admitting: Family Medicine

## 2024-06-07 ENCOUNTER — Ambulatory Visit (INDEPENDENT_AMBULATORY_CARE_PROVIDER_SITE_OTHER): Admitting: Family Medicine

## 2024-06-07 VITALS — BP 110/70 | HR 104 | Temp 98.5°F | Resp 12 | Ht 65.75 in | Wt 245.4 lb

## 2024-06-07 DIAGNOSIS — R42 Dizziness and giddiness: Secondary | ICD-10-CM | POA: Diagnosis not present

## 2024-06-07 DIAGNOSIS — I440 Atrioventricular block, first degree: Secondary | ICD-10-CM | POA: Diagnosis not present

## 2024-06-07 LAB — CBC
HCT: 39.8 % (ref 36.0–46.0)
Hemoglobin: 13.3 g/dL (ref 12.0–15.0)
MCHC: 33.5 g/dL (ref 30.0–36.0)
MCV: 78.9 fl (ref 78.0–100.0)
Platelets: 236 10*3/uL (ref 150.0–400.0)
RBC: 5.04 Mil/uL (ref 3.87–5.11)
RDW: 14.2 % (ref 11.5–15.5)
WBC: 5.1 10*3/uL (ref 4.0–10.5)

## 2024-06-07 LAB — BASIC METABOLIC PANEL WITH GFR
BUN: 6 mg/dL (ref 6–23)
CO2: 26 meq/L (ref 19–32)
Calcium: 9.3 mg/dL (ref 8.4–10.5)
Chloride: 107 meq/L (ref 96–112)
Creatinine, Ser: 0.92 mg/dL (ref 0.40–1.20)
GFR: 73.63 mL/min (ref >60.00)
Glucose, Bld: 85 mg/dL (ref 70–99)
Potassium: 4.2 meq/L (ref 3.5–5.1)
Sodium: 141 meq/L (ref 135–145)

## 2024-06-07 LAB — TSH: TSH: 2.65 u[IU]/mL (ref 0.35–5.50)

## 2024-06-07 NOTE — Patient Instructions (Addendum)
 A few things to remember from today's visit:  Dizziness - Plan: Basic metabolic panel with GFR, CBC, TSH, EKG 12-Lead, CANCELED: TSH  1st degree AV block - Plan: Basic metabolic panel with GFR, CBC, TSH, EKG 12-Lead Continue adequate hydration. Fall precautions. Move slowly. Monitor for new symptoms. Arrange appt with cardiologist.  If you need refills for medications you take chronically, please call your pharmacy. Do not use My Chart to request refills or for acute issues that need immediate attention. If you send a my chart message, it may take a few days to be addressed, specially if I am not in the office.  Please be sure medication list is accurate. If a new problem present, please set up appointment sooner than planned today.

## 2024-06-09 ENCOUNTER — Ambulatory Visit: Admitting: Family Medicine

## 2024-06-10 ENCOUNTER — Telehealth: Admitting: Physician Assistant

## 2024-06-10 DIAGNOSIS — B3731 Acute candidiasis of vulva and vagina: Secondary | ICD-10-CM | POA: Diagnosis not present

## 2024-06-11 MED ORDER — FLUCONAZOLE 150 MG PO TABS: 150.0000 mg | ORAL_TABLET | ORAL | 0 refills | Status: DC

## 2024-06-11 NOTE — Progress Notes (Signed)

## 2024-06-11 NOTE — Progress Notes (Signed)
 I have spent 5 minutes in review of e-visit questionnaire, review and updating patient chart, medical decision making and response to patient.   Laure Kidney, PA-C

## 2024-06-30 ENCOUNTER — Other Ambulatory Visit (HOSPITAL_COMMUNITY)
Admission: RE | Admit: 2024-06-30 | Discharge: 2024-06-30 | Disposition: A | Discharge status: Home / Self Care | Source: Ambulatory Visit | Attending: Family Medicine | Admitting: Family Medicine

## 2024-06-30 ENCOUNTER — Encounter: Payer: Self-pay | Admitting: Family Medicine

## 2024-06-30 ENCOUNTER — Ambulatory Visit: Admitting: Family Medicine

## 2024-06-30 VITALS — BP 108/74 | HR 100 | Temp 99.0°F | Ht 67.5 in | Wt 245.8 lb

## 2024-06-30 DIAGNOSIS — N6001 Solitary cyst of right breast: Secondary | ICD-10-CM

## 2024-06-30 DIAGNOSIS — N760 Acute vaginitis: Secondary | ICD-10-CM

## 2024-06-30 DIAGNOSIS — R300 Dysuria: Secondary | ICD-10-CM

## 2024-06-30 DIAGNOSIS — N6002 Solitary cyst of left breast: Secondary | ICD-10-CM | POA: Diagnosis not present

## 2024-06-30 LAB — POC URINALSYSI DIPSTICK (AUTOMATED)
Bilirubin, UA: POSITIVE
Blood, UA: NEGATIVE
Glucose, UA: NEGATIVE
Ketones, UA: POSITIVE
Leukocytes, UA: NEGATIVE
Nitrite, UA: NEGATIVE
Protein, UA: POSITIVE — AB
Spec Grav, UA: >=1.03 — AB (ref 1.010–1.025)
Urobilinogen, UA: 1 U/dL
pH, UA: 5.5 (ref 5.0–8.0)

## 2024-06-30 NOTE — Progress Notes (Signed)
 Established Patient Office Visit   Subjective  Patient ID: Krista Fisher, female    DOB: 11/14/1976  Age: 48 y.o. MRN: [id number]  Chief Complaint  Patient presents with   Medical Management of Chronic Issues    Urine burning started a week ago, patient was seen for Yeast a week ago, patient denies and discharge, and left breast lump, mid Under    Pt is a 48 year old female seen for acute concerns.  Patient endorses being treated with Diflucan  for yeast infection 2 weeks ago.  States still having intermittent burning sensation with urination and in vaginal area when exercising.  Patient denies frequency, difficulty starting stream, decreased UOP, low back pain, fever, chills, nausea, vomiting, rash, discharge.  Patient trying to drink more water but finds it difficult since being on Wegovy   Drinking about 64 ounces per day.  Patient also mentions noticing a lump in left breast on self-exam 2 nights ago.  Mammogram in February was normal.  Denies nipple drainage, inversion, erythema, tenderness.    Patient Active Problem List   Diagnosis Date Noted   Abnormal LFTs 11/13/2023   History of cholecystectomy 11/13/2023   Dilation of biliary tract 11/13/2023   Liver lesion 11/13/2023   Abnormal CT scan 11/13/2023   Iron  deficiency anemia 11/13/2023   Hiatal hernia 11/13/2023   Colon cancer screening 11/13/2023   Tongue pain 11/13/2023   Pancreatitis 06/24/2023   S/P hernia surgery 08/19/2022   S/P small bowel resection 08/19/2022   Hospital discharge follow-up 08/19/2022   Fibroid 09/06/2020   Benign neoplasm of connective and other soft tissue, unspecified 09/06/2020   Defecation symptom 07/02/2020   Rectal pain 06/04/2020   Anal fissure 04/05/2020   Hemorrhoids 04/05/2020   Dysmenorrhea 02/07/2020   Polycystic ovary syndrome 02/07/2020   Status post biliopancreatic diversion with duodenal switch 01/17/2020   Excessive daytime sleepiness 01/17/2020   Hypersomnia with sleep  apnea 01/17/2020   Loud snoring 01/17/2020   Morbid obesity with body mass index of 50 or higher (HCC) 01/17/2020   Morbid obesity with BMI of 40.0-44.9, adult (HCC) 09/15/2019   Arm numbness 03/08/2018   Anxiety state 04/11/2014   Intramural leiomyoma of uterus 11/03/2013   Menorrhagia 11/03/2013   Knee pain 05/28/2013   Osteoarthrosis involving lower leg 05/06/2011   EDEMA 02/04/2011   CHEST PAIN-UNSPECIFIED 02/04/2011   Vitamin D  deficiency 12/03/2009   Allergic rhinitis 12/03/2009   Morbid obesity (HCC) 10/31/2009   Benign essential hypertension 10/31/2009   BLOCK, OTHER HEART 10/31/2009   URI 10/31/2009   Gastro-esophageal reflux disease without esophagitis 10/31/2009   SLEEP APNEA 10/31/2009   PALPITATIONS 10/31/2009   1st degree AV block 10/31/2009   Major depressive disorder, single episode, unspecified 10/31/2009   Fatty liver disease, nonalcoholic 12/22/2008   Chronic joint pain 12/22/2008   Low back pain 04/06/1999   Depressive disorder 04/05/1994   Chronic post-traumatic stress disorder (PTSD) 04/05/1994   Past Medical History:  Diagnosis Date   Allergy 12/22/1993   Anemia 12/22/1993   Anxiety 12/22/1993   Depression 12/22/1993   GERD (gastroesophageal reflux disease) 12/22/1993   Hypertension 12/22/1993   During recent hospitilization 07/2299   Sleep apnea 12/22/1993   Past Surgical History:  Procedure Laterality Date   CESAREAN SECTION     CHOLECYSTECTOMY  04/22/2011   GASTRECTOMY     IR ANGIOGRAM PELVIS SELECTIVE OR SUPRASELECTIVE  09/06/2020   IR ANGIOGRAM SELECTIVE EACH ADDITIONAL VESSEL  09/06/2020   IR ANGIOGRAM SELECTIVE EACH ADDITIONAL VESSEL  09/06/2020  IR EMBO TUMOR ORGAN ISCHEMIA INFARCT INC GUIDE ROADMAPPING  09/06/2020   IR RADIOLOGIST EVAL & MGMT  07/11/2020   IR RADIOLOGIST EVAL & MGMT  09/19/2020   IR RADIOLOGIST EVAL & MGMT  05/08/2021   IR US  GUIDE VASC ACCESS LEFT  09/06/2020   KNEE SURGERY     LAPAROSCOPIC GASTRIC RESTRICTIVE  DUODENAL PROCEDURE (DUODENAL SWITCH)     LAPAROSCOPY N/A 08/08/2022   Procedure: LAPAROSCOPY DIAGNOSTIC;  Surgeon: Lyndel Deward PARAS, MD;  Location: [facility] OR;  Service: General;  Laterality: N/A;   LAPAROTOMY N/A 08/08/2022   Procedure: EXPLORATORY LAPAROTOMY;  Surgeon: Lyndel Deward PARAS, MD;  Location: [facility] OR;  Service: General;  Laterality: N/A;   SMALL INTESTINE SURGERY  03/05/2019   08/09/22   WRIST FRACTURE SURGERY     Social History   Tobacco Use   Smoking status: Never   Smokeless tobacco: Never  Vaping Use   Vaping status: Never Used  Substance Use Topics   Alcohol use: No   Drug use: No   Family History  Problem Relation Age of Onset   Hyperthyroidism Mother    Anxiety disorder Mother    Obesity Mother    Vision loss Mother    Learning disabilities Father    Hypertension Father    Hyperlipidemia Father    Heart disease Father    Heart failure Father    Diabetes Father    Anxiety disorder Father    Depression Father    Drug abuse Father    Intellectual disability Father    Multiple sclerosis Sister    Depression Paternal Uncle    Drug abuse Paternal Uncle    Early death Paternal Uncle    Diabetes Maternal Grandmother    Intellectual disability Maternal Grandmother    Hypertension Maternal Grandmother    Hyperlipidemia Maternal Grandmother    Heart disease Maternal Grandmother    Diabetes Paternal Grandmother    Learning disabilities Son    Depression Son    Depression Son    Learning disabilities Son    Anxiety disorder Son    Drug abuse Son    Alcohol abuse Maternal Uncle    Colon cancer Neg Hx    Stomach cancer Neg Hx    Esophageal cancer Neg Hx    Pancreatic cancer Neg Hx    Allergies  Allergen Reactions   Morphine  Other (See Comments) and Itching   Lamotrigine  Other (See Comments)    Patient had headaches, abdominal cramping   Protein Other (See Comments)   Wellbutrin Xl [Bupropion] Other (See Comments)    Abdominal pain   Latex Rash and  Other (See Comments)    Burning    ROS Negative unless stated above    Objective:     BP 108/74 (BP Location: Left Arm, Patient Position: Sitting, Cuff Size: Normal)   Pulse 100   Temp 99 F (37.2 C) (Oral)   Ht 5' 7.5 (1.715 m)   Wt 245 lb 12.8 oz (111.5 kg)   SpO2 95%   BMI 37.93 kg/m  BP Readings from Last 3 Encounters:  06/30/24 108/74  06/07/24 110/70  12/03/23 112/74   Wt Readings from Last 3 Encounters:  06/30/24 245 lb 12.8 oz (111.5 kg)  06/07/24 245 lb 6 oz (111.3 kg)  12/03/23 276 lb 12.8 oz (125.6 kg)      Physical Exam Constitutional:      General: She is not in acute distress.    Appearance: Normal appearance.  HENT:  Head: Normocephalic and atraumatic.     Nose: Nose normal.     Mouth/Throat:     Mouth: Mucous membranes are moist.  Cardiovascular:     Rate and Rhythm: Normal rate and regular rhythm.     Heart sounds: Normal heart sounds. No murmur heard.    No gallop.  Pulmonary:     Effort: Pulmonary effort is normal. No respiratory distress.     Breath sounds: Normal breath sounds. No wheezing, rhonchi or rales.  Chest:     Comments: No erythema, peau to orange, nipple inversion.  7-9 mm round, mobile cystic like lesions in bilateral breast without tenderness. Genitourinary:    Comments: Aptima self swab obtained. Skin:    General: Skin is warm and dry.  Neurological:     Mental Status: She is alert and oriented to person, place, and time.        06/30/2024    8:59 AM 12/30/2023    3:08 PM 12/03/2023    4:54 PM  Depression screen PHQ 2/9  Decreased Interest 1 3 2   Down, Depressed, Hopeless 1 3 1   PHQ - 2 Score 2 6 3   Altered sleeping 0 3 3  Tired, decreased energy 2 3 1   Change in appetite 1 0 0  Feeling bad or failure about yourself  1 0 1  Trouble concentrating 1 3 2   Moving slowly or fidgety/restless 0 0 0  Suicidal thoughts 0 0 0  PHQ-9 Score 7 15 10   Difficult doing work/chores Somewhat difficult  Somewhat difficult       06/30/2024    9:00 AM 12/30/2023    3:10 PM 12/03/2023    4:55 PM 09/18/2023    1:10 PM  GAD 7 : Generalized Anxiety Score  Nervous, Anxious, on Edge 1 2 1 2   Control/stop worrying 0 2 0 2  Worry too much - different things 0 2 1 2   Trouble relaxing 1 3 1 2   Restless 0 0 0 0  Easily annoyed or irritable 0 0 0 0  Afraid - awful might happen 1 3 1 2   Total GAD 7 Score 3 12 4 10   Anxiety Difficulty Somewhat difficult  Somewhat difficult Somewhat difficult     Results for orders placed or performed in visit on 06/30/24  POCT Urinalysis Dipstick (Automated)  Result Value Ref Range   Color, UA dark yellow    Clarity, UA clear    Glucose, UA Negative Negative   Bilirubin, UA pos    Ketones, UA pos    Spec Grav, UA >=1.030 (A) 1.010 - 1.025   Blood, UA neg    pH, UA 5.5 5.0 - 8.0   Protein, UA Positive (A) Negative   Urobilinogen, UA 1.0 0.2 or 1.0 E.U./dL   Nitrite, UA neg    Leukocytes, UA Negative Negative      Assessment & Plan:   Dysuria -     POCT Urinalysis Dipstick (Automated)  Acute vaginitis -     Cervicovaginal ancillary only  Bilateral breast cysts  Vaginal irritation likely 2/2 concentrated urine.  Aptima self swab collected.  POC UA with protein, ketones, SG >1.030.  Increase p.o. intake of water and fluids.  Patient advised to change sweaty close immediately after exercising to avoid further irritation.  Further recommendations if needed based on Aptima results.  Bilateral benign cyst in breast noted on exam.  Reviewed self exam and warning signs for concerning lumps, bumps.  Advised may notice a change in  breast tissue due to hormones/timing of cycle.  Nexplanon in place.  Continue regular yearly mammograms.  Return if symptoms worsen or fail to improve.   Clotilda JONELLE Single, MD

## 2024-07-01 LAB — CERVICOVAGINAL ANCILLARY ONLY
Bacterial Vaginitis (gardnerella): NEGATIVE
Candida Glabrata: NEGATIVE
Candida Vaginitis: NEGATIVE
Comment: NEGATIVE
Comment: NEGATIVE
Comment: NEGATIVE

## 2024-07-04 ENCOUNTER — Encounter: Payer: Self-pay | Admitting: Family Medicine

## 2024-07-04 ENCOUNTER — Ambulatory Visit: Payer: Self-pay | Admitting: Family Medicine

## 2024-07-06 ENCOUNTER — Ambulatory Visit: Admitting: Family Medicine

## 2024-07-25 ENCOUNTER — Other Ambulatory Visit (HOSPITAL_BASED_OUTPATIENT_CLINIC_OR_DEPARTMENT_OTHER): Payer: Self-pay

## 2024-07-28 ENCOUNTER — Ambulatory Visit: Admitting: Family Medicine

## 2024-07-28 ENCOUNTER — Ambulatory Visit (INDEPENDENT_AMBULATORY_CARE_PROVIDER_SITE_OTHER): Admitting: Family Medicine

## 2024-07-28 ENCOUNTER — Encounter: Payer: Self-pay | Admitting: Family Medicine

## 2024-07-28 VITALS — BP 98/72 | HR 105 | Temp 98.7°F | Ht 67.5 in | Wt 243.8 lb

## 2024-07-28 DIAGNOSIS — R202 Paresthesia of skin: Secondary | ICD-10-CM

## 2024-07-28 DIAGNOSIS — Z9889 Other specified postprocedural states: Secondary | ICD-10-CM

## 2024-07-28 DIAGNOSIS — R300 Dysuria: Secondary | ICD-10-CM | POA: Diagnosis not present

## 2024-07-28 DIAGNOSIS — F411 Generalized anxiety disorder: Secondary | ICD-10-CM

## 2024-07-28 LAB — COMPREHENSIVE METABOLIC PANEL WITH GFR
ALT: 22 U/L (ref 0–35)
AST: 32 U/L (ref 0–37)
Albumin: 4.1 g/dL (ref 3.5–5.2)
Alkaline Phosphatase: 116 U/L (ref 39–117)
BUN: 8 mg/dL (ref 6–23)
CO2: 24 meq/L (ref 19–32)
Calcium: 9.3 mg/dL (ref 8.4–10.5)
Chloride: 105 meq/L (ref 96–112)
Creatinine, Ser: 0.91 mg/dL (ref 0.40–1.20)
GFR: 74.53 mL/min (ref >60.00)
Glucose, Bld: 84 mg/dL (ref 70–99)
Potassium: 4.7 meq/L (ref 3.5–5.1)
Sodium: 143 meq/L (ref 135–145)
Total Bilirubin: 0.4 mg/dL (ref 0.2–1.2)
Total Protein: 7 g/dL (ref 6.0–8.3)

## 2024-07-28 LAB — POCT URINALYSIS DIPSTICK
Bilirubin, UA: POSITIVE
Blood, UA: NEGATIVE
Glucose, UA: NEGATIVE
Ketones, UA: NEGATIVE
Leukocytes, UA: NEGATIVE
Nitrite, UA: NEGATIVE
Protein, UA: POSITIVE — AB
Spec Grav, UA: 1.025 (ref 1.010–1.025)
Urobilinogen, UA: 0.2 U/dL
pH, UA: 6 (ref 5.0–8.0)

## 2024-07-28 LAB — CBC WITH DIFFERENTIAL/PLATELET
Basophils Absolute: 0 K/uL (ref 0.0–0.1)
Basophils Relative: 0.3 % (ref 0.0–3.0)
Eosinophils Absolute: 0.2 K/uL (ref 0.0–0.7)
Eosinophils Relative: 1.8 % (ref 0.0–5.0)
HCT: 39.8 % (ref 36.0–46.0)
Hemoglobin: 13.3 g/dL (ref 12.0–15.0)
Lymphocytes Relative: 20.3 % (ref 12.0–46.0)
Lymphs Abs: 1.7 K/uL (ref 0.7–4.0)
MCHC: 33.3 g/dL (ref 30.0–36.0)
MCV: 79.6 fl (ref 78.0–100.0)
Monocytes Absolute: 0.6 K/uL (ref 0.1–1.0)
Monocytes Relative: 7.1 % (ref 3.0–12.0)
Neutro Abs: 6 K/uL (ref 1.4–7.7)
Neutrophils Relative %: 70.5 % (ref 43.0–77.0)
Platelets: 241 K/uL (ref 150.0–400.0)
RBC: 5.01 Mil/uL (ref 3.87–5.11)
RDW: 14.1 % (ref 11.5–15.5)
WBC: 8.6 K/uL (ref 4.0–10.5)

## 2024-07-28 LAB — FOLATE: Folate: 21.4 ng/mL (ref >5.9)

## 2024-07-28 LAB — VITAMIN B12: Vitamin B-12: >1500 pg/mL — ABNORMAL HIGH (ref 211–911)

## 2024-07-28 NOTE — Progress Notes (Signed)
 Established Patient Office Visit   Subjective  Patient ID: Krista Fisher, female    DOB: 10/26/1976  Age: 48 y.o. MRN: [id number]  Chief Complaint  Patient presents with   Acute Visit    Patient came in today for numbness bilateral thumbs and toes, started  weeks ago, only last 5 mins at a time, also bladder pressure and only urinating a little, dark color, burning     Pt is a 48 year old female seen for ongoing concern.  Patient endorses intermittent numbness/tingling in toes and thumbs x 1 month.  May last 5-6 minutes at a time.  Can occur at any time whether sitting or standing.  Patient endorses some neck pain while sitting at computer working.  Taking multivitamin.  Was taking additional vitamin supplements immediately after duodenal switch.  Patient states stopped taking the additional supplements after recent bowel resection as duodenal length adjusted to help with absorption.  Pt concern for possible UTI.  Endorses urinary pressure, dysuria, dark-colored.  Trying to increase water intake.  Endorses occasional constipation.  Taking MiraLAX  daily.  On Wegovy  which may be contributing.  Denies hematuria, back pain, nausea, vomiting.  Requesting refill on Carafate  and oscimin .  Taking as needed.  Would like in case needed during an upcoming trip to [place].      Patient Active Problem List   Diagnosis Date Noted   Abnormal LFTs 11/13/2023   History of cholecystectomy 11/13/2023   Dilation of biliary tract 11/13/2023   Liver lesion 11/13/2023   Abnormal CT scan 11/13/2023   Iron  deficiency anemia 11/13/2023   Hiatal hernia 11/13/2023   Colon cancer screening 11/13/2023   Tongue pain 11/13/2023   Pancreatitis 06/24/2023   S/P hernia surgery 08/19/2022   S/P small bowel resection 08/19/2022   Hospital discharge follow-up 08/19/2022   Fibroid 09/06/2020   Benign neoplasm of connective and other soft tissue, unspecified 09/06/2020   Defecation symptom 07/02/2020   Rectal pain  06/04/2020   Anal fissure 04/05/2020   Hemorrhoids 04/05/2020   Dysmenorrhea 02/07/2020   Polycystic ovary syndrome 02/07/2020   Status post biliopancreatic diversion with duodenal switch 01/17/2020   Excessive daytime sleepiness 01/17/2020   Hypersomnia with sleep apnea 01/17/2020   Loud snoring 01/17/2020   Morbid obesity with body mass index of 50 or higher (HCC) 01/17/2020   Morbid obesity with BMI of 40.0-44.9, adult (HCC) 09/15/2019   Arm numbness 03/08/2018   Anxiety state 04/11/2014   Intramural leiomyoma of uterus 11/03/2013   Menorrhagia 11/03/2013   Knee pain 05/28/2013   Osteoarthrosis involving lower leg 05/06/2011   EDEMA 02/04/2011   CHEST PAIN-UNSPECIFIED 02/04/2011   Vitamin D  deficiency 12/03/2009   Allergic rhinitis 12/03/2009   Morbid obesity (HCC) 10/31/2009   Benign essential hypertension 10/31/2009   BLOCK, OTHER HEART 10/31/2009   URI 10/31/2009   Gastro-esophageal reflux disease without esophagitis 10/31/2009   SLEEP APNEA 10/31/2009   PALPITATIONS 10/31/2009   1st degree AV block 10/31/2009   Major depressive disorder, single episode, unspecified 10/31/2009   Fatty liver disease, nonalcoholic 12/22/2008   Chronic joint pain 12/22/2008   Low back pain 04/06/1999   Depressive disorder 04/05/1994   Chronic post-traumatic stress disorder (PTSD) 04/05/1994   Past Medical History:  Diagnosis Date   Allergy 12/22/1993   Anemia 12/22/1993   Anxiety 12/22/1993   Depression 12/22/1993   GERD (gastroesophageal reflux disease) 12/22/1993   Hypertension 12/22/1993   During recent hospitilization 07/2299   Sleep apnea 12/22/1993   Past Surgical History:  Procedure Laterality Date   CESAREAN SECTION     CHOLECYSTECTOMY  04/22/2011   GASTRECTOMY     IR ANGIOGRAM PELVIS SELECTIVE OR SUPRASELECTIVE  09/06/2020   IR ANGIOGRAM SELECTIVE EACH ADDITIONAL VESSEL  09/06/2020   IR ANGIOGRAM SELECTIVE EACH ADDITIONAL VESSEL  09/06/2020   IR EMBO TUMOR ORGAN  ISCHEMIA INFARCT INC GUIDE ROADMAPPING  09/06/2020   IR RADIOLOGIST EVAL & MGMT  07/11/2020   IR RADIOLOGIST EVAL & MGMT  09/19/2020   IR RADIOLOGIST EVAL & MGMT  05/08/2021   IR US  GUIDE VASC ACCESS LEFT  09/06/2020   KNEE SURGERY     LAPAROSCOPIC GASTRIC RESTRICTIVE DUODENAL PROCEDURE (DUODENAL SWITCH)     LAPAROSCOPY N/A 08/08/2022   Procedure: LAPAROSCOPY DIAGNOSTIC;  Surgeon: Lyndel Deward PARAS, MD;  Location: [facility] OR;  Service: General;  Laterality: N/A;   LAPAROTOMY N/A 08/08/2022   Procedure: EXPLORATORY LAPAROTOMY;  Surgeon: Lyndel Deward PARAS, MD;  Location: [facility] OR;  Service: General;  Laterality: N/A;   SMALL INTESTINE SURGERY  03/05/2019   08/09/22   WRIST FRACTURE SURGERY     Social History   Tobacco Use   Smoking status: Never   Smokeless tobacco: Never  Vaping Use   Vaping status: Never Used  Substance Use Topics   Alcohol use: No   Drug use: No   Family History  Problem Relation Age of Onset   Hyperthyroidism Mother    Anxiety disorder Mother    Obesity Mother    Vision loss Mother    Learning disabilities Father    Hypertension Father    Hyperlipidemia Father    Heart disease Father    Heart failure Father    Diabetes Father    Anxiety disorder Father    Depression Father    Drug abuse Father    Intellectual disability Father    Multiple sclerosis Sister    Depression Paternal Uncle    Drug abuse Paternal Uncle    Early death Paternal Uncle    Diabetes Maternal Grandmother    Intellectual disability Maternal Grandmother    Hypertension Maternal Grandmother    Hyperlipidemia Maternal Grandmother    Heart disease Maternal Grandmother    Diabetes Paternal Grandmother    Learning disabilities Son    Depression Son    Depression Son    Learning disabilities Son    Anxiety disorder Son    Drug abuse Son    Alcohol abuse Maternal Uncle    Colon cancer Neg Hx    Stomach cancer Neg Hx    Esophageal cancer Neg Hx    Pancreatic cancer Neg Hx     Allergies  Allergen Reactions   Morphine  Other (See Comments) and Itching   Lamotrigine  Other (See Comments)    Patient had headaches, abdominal cramping   Protein Other (See Comments)   Wellbutrin Xl [Bupropion] Other (See Comments)    Abdominal pain   Latex Rash and Other (See Comments)    Burning    ROS Negative unless stated above    Objective:     BP 98/72 (BP Location: Left Arm, Patient Position: Sitting, Cuff Size: Large)   Pulse (!) 105   Temp 98.7 F (37.1 C) (Oral)   Ht 5' 7.5 (1.715 m)   Wt 243 lb 12.8 oz (110.6 kg)   LMP  (LMP Unknown)   SpO2 98%   BMI 37.62 kg/m  BP Readings from Last 3 Encounters:  07/28/24 98/72  06/30/24 108/74  06/07/24 110/70   Wt Readings  from Last 3 Encounters:  07/28/24 243 lb 12.8 oz (110.6 kg)  06/30/24 245 lb 12.8 oz (111.5 kg)  06/07/24 245 lb 6 oz (111.3 kg)      Physical Exam Constitutional:      General: She is not in acute distress.    Appearance: Normal appearance.  HENT:     Head: Normocephalic and atraumatic.     Nose: Nose normal.     Mouth/Throat:     Mouth: Mucous membranes are moist.  Cardiovascular:     Rate and Rhythm: Normal rate and regular rhythm.     Heart sounds: Normal heart sounds. No murmur heard.    No gallop.  Pulmonary:     Effort: Pulmonary effort is normal. No respiratory distress.     Breath sounds: Normal breath sounds. No wheezing, rhonchi or rales.  Abdominal:     General: Bowel sounds are normal.     Palpations: Abdomen is soft.     Tenderness: There is no abdominal tenderness. There is no guarding or rebound.  Skin:    General: Skin is warm and dry.  Neurological:     Mental Status: She is alert and oriented to person, place, and time.        07/28/2024    9:36 AM 06/30/2024    8:59 AM 12/30/2023    3:08 PM  Depression screen PHQ 2/9  Decreased Interest 2 1 3   Down, Depressed, Hopeless 2 1 3   PHQ - 2 Score 4 2 6   Altered sleeping 2 0 3  Tired, decreased energy 2 2 3    Change in appetite 1 1 0  Feeling bad or failure about yourself  0 1 0  Trouble concentrating 1 1 3   Moving slowly or fidgety/restless 0 0 0  Suicidal thoughts 0 0 0  PHQ-9 Score 10 7 15   Difficult doing work/chores Very difficult Somewhat difficult       07/28/2024    9:36 AM 06/30/2024    9:00 AM 12/30/2023    3:10 PM 12/03/2023    4:55 PM  GAD 7 : Generalized Anxiety Score  Nervous, Anxious, on Edge 1 1 2 1   Control/stop worrying 1 0 2 0  Worry too much - different things 1 0 2 1  Trouble relaxing 1 1 3 1   Restless 0 0 0 0  Easily annoyed or irritable 0 0 0 0  Afraid - awful might happen 3 1 3 1   Total GAD 7 Score 7 3 12 4   Anxiety Difficulty Very difficult Somewhat difficult  Somewhat difficult     Results for orders placed or performed in visit on 07/28/24  POCT urinalysis dipstick  Result Value Ref Range   Color, UA Dark yellow    Clarity, UA clear    Glucose, UA Negative Negative   Bilirubin, UA pos    Ketones, UA neg    Spec Grav, UA 1.025 1.010 - 1.025   Blood, UA neg    pH, UA 6.0 5.0 - 8.0   Protein, UA Positive (A) Negative   Urobilinogen, UA 0.2 0.2 or 1.0 E.U./dL   Nitrite, UA neg    Leukocytes, UA Negative Negative   Appearance     Odor        Assessment & Plan:   Dysuria -     POCT urinalysis dipstick  GAD (generalized anxiety disorder)  Paresthesia -     Vitamin B12; Future -     Folate; Future -  CBC with Differential/Platelet; Future -     Comprehensive metabolic panel with GFR; Future -     Zinc ; Future -     Iron , TIBC and Ferritin Panel; Future  S/P gastric surgery -     Zinc ; Future -     Iron , TIBC and Ferritin Panel; Future   Acute urinary symptoms.  POC UA with protein and bilirubin, SG 1.025.  Encouraged to increase p.o. intake of water and fluids.  GAD-7 score 7 this visit, PHQ-9 score 10.  Discussed the importance of self-care.  Continue current medications including Lexapro  20 mg daily and Xanax  as needed.   Counseling.  Paresthesias in hands and feet.  Discussed possible causes including vitamin or electrolyte deficiency.  Obtain labs.  Replete as needed.  Also discussed nerve compression/posture as a cause of symptoms.  Ergonomic workspace modifications encouraged.  Patient with a history of gastric bypass.  Discussed the importance of bariatric multivitamin daily.  Avoid extended release medications and NSAIDs.   Return if symptoms worsen or fail to improve.   Clotilda JONELLE Single, MD

## 2024-07-29 ENCOUNTER — Emergency Department (HOSPITAL_BASED_OUTPATIENT_CLINIC_OR_DEPARTMENT_OTHER): Admitting: Radiology

## 2024-07-29 ENCOUNTER — Other Ambulatory Visit: Payer: Self-pay

## 2024-07-29 ENCOUNTER — Encounter (HOSPITAL_BASED_OUTPATIENT_CLINIC_OR_DEPARTMENT_OTHER): Payer: Self-pay

## 2024-07-29 ENCOUNTER — Emergency Department (HOSPITAL_BASED_OUTPATIENT_CLINIC_OR_DEPARTMENT_OTHER)
Admission: EM | Admit: 2024-07-29 | Discharge: 2024-07-29 | Disposition: A | Discharge status: Home / Self Care | Attending: Emergency Medicine | Admitting: Emergency Medicine

## 2024-07-29 ENCOUNTER — Emergency Department (HOSPITAL_BASED_OUTPATIENT_CLINIC_OR_DEPARTMENT_OTHER)

## 2024-07-29 DIAGNOSIS — R1110 Vomiting, unspecified: Secondary | ICD-10-CM | POA: Diagnosis not present

## 2024-07-29 DIAGNOSIS — X501XXA Overexertion from prolonged static or awkward postures, initial encounter: Secondary | ICD-10-CM | POA: Insufficient documentation

## 2024-07-29 DIAGNOSIS — Z91040 Latex allergy status: Secondary | ICD-10-CM | POA: Diagnosis not present

## 2024-07-29 DIAGNOSIS — R079 Chest pain, unspecified: Secondary | ICD-10-CM | POA: Insufficient documentation

## 2024-07-29 DIAGNOSIS — Y9239 Other specified sports and athletic area as the place of occurrence of the external cause: Secondary | ICD-10-CM | POA: Diagnosis not present

## 2024-07-29 DIAGNOSIS — R109 Unspecified abdominal pain: Secondary | ICD-10-CM

## 2024-07-29 DIAGNOSIS — R1013 Epigastric pain: Secondary | ICD-10-CM | POA: Insufficient documentation

## 2024-07-29 LAB — BASIC METABOLIC PANEL WITH GFR
Anion gap: 11 (ref 5–15)
BUN: 8 mg/dL (ref 6–20)
CO2: 24 mmol/L (ref 22–32)
Calcium: 9.7 mg/dL (ref 8.9–10.3)
Chloride: 107 mmol/L (ref 98–111)
Creatinine, Ser: 1.08 mg/dL — ABNORMAL HIGH (ref 0.44–1.00)
GFR, Estimated: >60 mL/min (ref >60)
Glucose, Bld: 93 mg/dL (ref 70–99)
Potassium: 4.1 mmol/L (ref 3.5–5.1)
Sodium: 141 mmol/L (ref 135–145)

## 2024-07-29 LAB — TROPONIN T, HIGH SENSITIVITY
Troponin T High Sensitivity: <15 ng/L (ref <19)
Troponin T High Sensitivity: <15 ng/L (ref <19)

## 2024-07-29 LAB — CBC
HCT: 38.6 % (ref 36.0–46.0)
Hemoglobin: 13.3 g/dL (ref 12.0–15.0)
MCH: 26.7 pg (ref 26.0–34.0)
MCHC: 34.5 g/dL (ref 30.0–36.0)
MCV: 77.5 fL — ABNORMAL LOW (ref 80.0–100.0)
Platelets: 267 K/uL (ref 150–400)
RBC: 4.98 MIL/uL (ref 3.87–5.11)
RDW: 13.6 % (ref 11.5–15.5)
WBC: 10.9 K/uL — ABNORMAL HIGH (ref 4.0–10.5)
nRBC: 0 % (ref 0.0–0.2)

## 2024-07-29 LAB — PREGNANCY, URINE: Preg Test, Ur: NEGATIVE

## 2024-07-29 MED ORDER — KETOROLAC TROMETHAMINE 30 MG/ML IJ SOLN
15.0000 mg | Freq: Once | INTRAMUSCULAR | Status: AC
  Administered 2024-07-29: 15 mg via INTRAVENOUS
  Filled 2024-07-29: qty 1

## 2024-07-29 MED ORDER — IOHEXOL 300 MG/ML  SOLN
90.0000 mL | Freq: Once | INTRAMUSCULAR | Status: AC | PRN
  Administered 2024-07-29: 90 mL via INTRAVENOUS

## 2024-07-29 MED ORDER — ONDANSETRON HCL 4 MG/2ML IJ SOLN
4.0000 mg | Freq: Once | INTRAMUSCULAR | Status: AC
  Administered 2024-07-29: 4 mg via INTRAVENOUS
  Filled 2024-07-29: qty 2

## 2024-07-29 MED ORDER — METAXALONE 800 MG PO TABS
800.0000 mg | ORAL_TABLET | Freq: Three times a day (TID) | ORAL | 0 refills | Status: AC
Start: 2024-07-29 — End: ?

## 2024-07-29 MED ORDER — SODIUM CHLORIDE 0.9 % IV BOLUS
1000.0000 mL | Freq: Once | INTRAVENOUS | Status: AC
  Administered 2024-07-29: 1000 mL via INTRAVENOUS

## 2024-07-29 MED ORDER — IBUPROFEN 400 MG PO TABS
400.0000 mg | ORAL_TABLET | Freq: Four times a day (QID) | ORAL | 0 refills | Status: AC | PRN
Start: 2024-07-29 — End: ?

## 2024-07-29 MED ORDER — SODIUM CHLORIDE 0.9 % IV SOLN: INTRAVENOUS | Status: DC

## 2024-07-29 NOTE — ED Provider Notes (Signed)
 [facility] AT [facility] Provider Note   CSN: [id number] Arrival date & time: 07/29/24  1644     Patient presents with: Chest Pain   Krista Fisher is a 48 y.o. female.   48 year old female presents with 2 days of epigastric abdominal pain.  States that symptoms started when she was at the gym and she was using that machine.  She states when she extended her back she felt a pop in her subxiphoid region.  Since that time she has had emesis x 2.  Has a history of an incarcerated hiatal hernia.  Denies any fever or chills.  States that this does feel like that.  No diarrhea noted.  No treatment used prior to arrival       Prior to Admission medications   Medication Sig Start Date End Date Taking? Authorizing Provider  acetaminophen  (TYLENOL ) 500 MG tablet Take 500 mg by mouth every 6 (six) hours as needed.    [provider]  ALPRAZolam  (XANAX ) 0.5 MG tablet Take 1 tablet (0.5 mg total dose) daily as needed for panic attacks. 02/25/24   Mercer Clotilda SAUNDERS, MD  Calcium  Carb-Cholecalciferol  600-10 MG-MCG TABS Take 1 tablet by mouth daily. 01/25/20   [provider]  cetirizine (ZYRTEC) 10 MG tablet Take 10 mg by mouth daily.    [provider]  COLLAGEN PO Take 1 tablet by mouth daily.     [provider]  cyanocobalamin  (VITAMIN B12) 500 MCG tablet Take 1,000 mcg by mouth daily.    [provider]  escitalopram  (LEXAPRO ) 20 MG tablet Take 1 tablet (20 mg total) by mouth daily. 04/24/23     etonogestrel (NEXPLANON) 68 MG IMPL implant 1 each by Subdermal route once. 06/06/20   [provider]  ferrous sulfate 325 (65 FE) MG tablet Take 325 mg by mouth daily with breakfast.    [provider]  fluconazole  (DIFLUCAN ) 150 MG tablet Take 1 tablet (150 mg total) by mouth every 3 (three) days. 06/11/24   Rolan Berthold, PA-C  fluticasone  (FLONASE ) 50 MCG/ACT nasal spray Place 1 spray into both nostrils daily.     [provider]  Ginger, Zingiber officinalis, (GINGER PO) Take 1 tablet by mouth daily.    [provider]  hyoscyamine  (LEVSIN  SL) 0.125 MG SL tablet Place 1 tablet (0.125 mg total) under the tongue every 4 (four) hours as needed for cramping for upto 10 days. 08/31/23     magnesium  30 MG tablet Take 200 mg by mouth 2 (two) times daily.    [provider]  Multiple Vitamins-Iron  (MULTIVITAMINS WITH IRON ) TABS tablet Take 1 tablet by mouth daily.    [provider]  omeprazole  (PRILOSEC) 40 MG capsule Take 1 capsule (40 mg total) by mouth in the morning before meal. 04/24/23     ondansetron  (ZOFRAN -ODT) 4 MG disintegrating tablet Take 1 tablet (4 mg total) by mouth every 8 (eight) hours as needed for nausea or vomiting. 07/23/23   Silver Wonda LABOR, PA  Semaglutide -Weight Management (WEGOVY ) 0.5 MG/0.5ML SOAJ Inject 0.5 mg into the skin once a week. 02/10/24     sucralfate  (CARAFATE ) 1 g tablet Take 1 tablet (1 g total) by mouth 3 (three) times daily before meals  Take on an empty stomach 10/26/23     Turmeric (QC TUMERIC COMPLEX PO) Take 1 tablet by mouth daily.    [provider]  Vitamin A  (A-25 PO) Take 1 tablet by mouth daily.  [provider]  zinc  gluconate 50 MG tablet Take 50 mg by mouth daily.    [provider]    Allergies: Morphine , Lamotrigine , Protein, Wellbutrin xl [bupropion], and Latex    Review of Systems  All other systems reviewed and are negative.   Updated Vital Signs BP 121/78 (BP Location: Right Arm)   Pulse 84   Temp 98.6 F (37 C) (Oral)   Resp 18   LMP  (LMP Unknown)   SpO2 96%   Physical Exam Vitals and nursing note reviewed.  Constitutional:      General: She is not in acute distress.    Appearance: Normal appearance. She is well-developed. She is not toxic-appearing.  HENT:     Head: Normocephalic and atraumatic.  Eyes:     General: Lids are normal.     Conjunctiva/sclera: Conjunctivae  normal.     Pupils: Pupils are equal, round, and reactive to light.  Neck:     Thyroid : No thyroid  mass.     Trachea: No tracheal deviation.  Cardiovascular:     Rate and Rhythm: Normal rate and regular rhythm.     Heart sounds: Normal heart sounds. No murmur heard.    No gallop.  Pulmonary:     Effort: Pulmonary effort is normal. No respiratory distress.     Breath sounds: Normal breath sounds. No stridor. No decreased breath sounds, wheezing, rhonchi or rales.  Abdominal:     General: There is no distension.     Palpations: Abdomen is soft.     Tenderness: There is no abdominal tenderness. There is no rebound.   Musculoskeletal:        General: No tenderness. Normal range of motion.     Cervical back: Normal range of motion and neck supple.  Skin:    General: Skin is warm and dry.     Findings: No abrasion or rash.  Neurological:     Mental Status: She is alert and oriented to person, place, and time. Mental status is at baseline.     GCS: GCS eye subscore is 4. GCS verbal subscore is 5. GCS motor subscore is 6.     Cranial Nerves: No cranial nerve deficit.     Sensory: No sensory deficit.     Motor: Motor function is intact.  Psychiatric:        Attention and Perception: Attention normal.        Speech: Speech normal.        Behavior: Behavior normal.     (all labs ordered are listed, but only abnormal results are displayed) Labs Reviewed  BASIC METABOLIC PANEL WITH GFR - Abnormal; Notable for the following components:      Result Value   Creatinine, Ser 1.08 (*)    All other components within normal limits  CBC - Abnormal; Notable for the following components:   WBC 10.9 (*)    MCV 77.5 (*)    All other components within normal limits  PREGNANCY, URINE  TROPONIN T, HIGH SENSITIVITY  TROPONIN T, HIGH SENSITIVITY    EKG: EKG Interpretation Date/Time:  Friday July 29 2024 16:58:49 EDT Ventricular Rate:  87 PR Interval:  190 QRS Duration:  70 QT  Interval:  354 QTC Calculation: 425 R Axis:   93  Text Interpretation: Normal sinus rhythm Rightward axis Nonspecific T wave abnormality Abnormal ECG When compared with ECG of 22-Jul-2023 23:46, PR interval has decreased Confirmed by Dasie Faden ([id number]) on 07/29/2024 6:21:09 PM  Radiology: [facility]  Chest 2 View Result Date: 07/29/2024 CLINICAL DATA:  Chest pain EXAM: CHEST - 2 VIEW COMPARISON:  Chest x-ray 08/09/2019. FINDINGS: The heart size and mediastinal contours are within normal limits. Both lungs are clear. The visualized skeletal structures are unremarkable. IMPRESSION: No active cardiopulmonary disease. Electronically Signed   By: Greig Pique M.D.   On: 07/29/2024 17:51     Procedures   Medications Ordered in the ED  0.9 %  sodium chloride  infusion (has no administration in time range)  sodium chloride  0.9 % bolus 1,000 mL (has no administration in time range)  ondansetron  (ZOFRAN ) injection 4 mg (has no administration in time range)                                    Medical Decision Making Amount and/or Complexity of Data Reviewed Labs: ordered. Radiology: ordered.  Risk Prescription drug management.   Patient is EKG shows normal sinus rhythm.  No signs of acute ischemic changes noted.  Chest x-ray without acute findings.  Troponin was negative.  CT of abdomen and pelvis showed no evidence of incarcerated hernia.  Patient was given Toradol  and feels much better at this time.  Suspect musculoskeletal etiology and will prescribe muscle relaxants and anti-inflammatories     Final diagnoses:  None    ED Discharge Orders     None          Dasie Faden, MD 07/29/24 2016

## 2024-07-29 NOTE — ED Triage Notes (Signed)
 Pt arrived POV c/o of central chest pain that started this morning. Non-radiating pain. Nausea and emesis x2 today. Also reports pain increases when taking a deep breath.

## 2024-08-01 LAB — ZINC: Zinc: 60 ug/dL (ref 60–130)

## 2024-08-01 LAB — IRON,TIBC AND FERRITIN PANEL
%SAT: 11 % — ABNORMAL LOW (ref 16–45)
Ferritin: 30 ng/mL (ref 16–232)
Iron: 39 ug/dL — ABNORMAL LOW (ref 40–190)
TIBC: 343 ug/dL (ref 250–450)

## 2024-08-02 ENCOUNTER — Other Ambulatory Visit (HOSPITAL_BASED_OUTPATIENT_CLINIC_OR_DEPARTMENT_OTHER): Payer: Self-pay

## 2024-08-03 ENCOUNTER — Ambulatory Visit: Payer: Self-pay | Admitting: Family Medicine

## 2024-08-05 ENCOUNTER — Inpatient Hospital Stay: Admitting: Family Medicine

## 2024-08-12 ENCOUNTER — Ambulatory Visit (HOSPITAL_BASED_OUTPATIENT_CLINIC_OR_DEPARTMENT_OTHER): Admitting: Orthopaedic Surgery

## 2024-08-26 ENCOUNTER — Ambulatory Visit: Admitting: Family Medicine

## 2024-08-26 ENCOUNTER — Encounter: Payer: Self-pay | Admitting: Family Medicine

## 2024-08-26 VITALS — BP 94/78 | HR 94 | Temp 98.9°F | Ht 67.5 in | Wt 245.2 lb

## 2024-08-26 DIAGNOSIS — K649 Unspecified hemorrhoids: Secondary | ICD-10-CM | POA: Diagnosis not present

## 2024-08-26 MED ORDER — NIFEDIPINE 0.3 % OINTMENT: 1.0000 | TOPICAL_OINTMENT | CUTANEOUS | 0 refills | Status: DC

## 2024-08-26 NOTE — Progress Notes (Signed)
 Established Patient Office Visit   Subjective  Patient ID: Krista Fisher, female    DOB: 1976/02/23  Age: 48 y.o. MRN: [id number]  Chief Complaint  Patient presents with   Rectal Pain    Pt reports anal pain and think she has anal fissure. Going on for a wk. Pt denied anal bleed. Unable to see GI until October. Pt reports when she had it before it took her 2 and half year for it to be heal.    Hemorrhoids    Pt is a 48 yo female seen for acute concern.  Pt endorses persistent burning sensation in her anus after an initial intense pain with defecation.  Sensation similar to that when previously had an anal fissure and hemorrhoids requiring nifedipine  ointment.  Patient having regular BMs, having to push a little more but not strain.  Denies constipation or prolonged sitting on toilet.  Patient using Preparation H wipes, taking sitz bath's, heat, increasing water and fiber intake.  Patient is on iron  tablets twice daily.  Taking MiraLAX .  Has appointment with GI in October.    Patient Active Problem List   Diagnosis Date Noted   Abnormal LFTs 11/13/2023   History of cholecystectomy 11/13/2023   Dilation of biliary tract 11/13/2023   Liver lesion 11/13/2023   Abnormal CT scan 11/13/2023   Iron  deficiency anemia 11/13/2023   Hiatal hernia 11/13/2023   Colon cancer screening 11/13/2023   Tongue pain 11/13/2023   Pancreatitis 06/24/2023   S/P hernia surgery 08/19/2022   S/P small bowel resection 08/19/2022   Hospital discharge follow-up 08/19/2022   Fibroid 09/06/2020   Benign neoplasm of connective and other soft tissue, unspecified 09/06/2020   Defecation symptom 07/02/2020   Rectal pain 06/04/2020   Anal fissure 04/05/2020   Hemorrhoids 04/05/2020   Dysmenorrhea 02/07/2020   Polycystic ovary syndrome 02/07/2020   Status post biliopancreatic diversion with duodenal switch 01/17/2020   Excessive daytime sleepiness 01/17/2020   Hypersomnia with sleep apnea 01/17/2020   Loud  snoring 01/17/2020   Morbid obesity with body mass index of 50 or higher (HCC) 01/17/2020   Morbid obesity with BMI of 40.0-44.9, adult (HCC) 09/15/2019   Arm numbness 03/08/2018   Anxiety state 04/11/2014   Intramural leiomyoma of uterus 11/03/2013   Menorrhagia 11/03/2013   Knee pain 05/28/2013   Osteoarthrosis involving lower leg 05/06/2011   EDEMA 02/04/2011   CHEST PAIN-UNSPECIFIED 02/04/2011   Vitamin D  deficiency 12/03/2009   Allergic rhinitis 12/03/2009   Morbid obesity (HCC) 10/31/2009   Benign essential hypertension 10/31/2009   BLOCK, OTHER HEART 10/31/2009   URI 10/31/2009   Gastro-esophageal reflux disease without esophagitis 10/31/2009   SLEEP APNEA 10/31/2009   PALPITATIONS 10/31/2009   1st degree AV block 10/31/2009   Major depressive disorder, single episode, unspecified 10/31/2009   Fatty liver disease, nonalcoholic 12/22/2008   Chronic joint pain 12/22/2008   Low back pain 04/06/1999   Depressive disorder 04/05/1994   Chronic post-traumatic stress disorder (PTSD) 04/05/1994   Past Medical History:  Diagnosis Date   Allergy 12/22/1993   Anemia 12/22/1993   Anxiety 12/22/1993   Depression 12/22/1993   GERD (gastroesophageal reflux disease) 12/22/1993   Hypertension 12/22/1993   During recent hospitilization 07/2299   Sleep apnea 12/22/1993   Past Surgical History:  Procedure Laterality Date   CESAREAN SECTION     CHOLECYSTECTOMY  04/22/2011   GASTRECTOMY     IR ANGIOGRAM PELVIS SELECTIVE OR SUPRASELECTIVE  09/06/2020   IR ANGIOGRAM SELECTIVE EACH ADDITIONAL  VESSEL  09/06/2020   IR ANGIOGRAM SELECTIVE EACH ADDITIONAL VESSEL  09/06/2020   IR EMBO TUMOR ORGAN ISCHEMIA INFARCT INC GUIDE ROADMAPPING  09/06/2020   IR RADIOLOGIST EVAL & MGMT  07/11/2020   IR RADIOLOGIST EVAL & MGMT  09/19/2020   IR RADIOLOGIST EVAL & MGMT  05/08/2021   IR US  GUIDE VASC ACCESS LEFT  09/06/2020   KNEE SURGERY     LAPAROSCOPIC GASTRIC RESTRICTIVE DUODENAL PROCEDURE (DUODENAL  SWITCH)     LAPAROSCOPY N/A 08/08/2022   Procedure: LAPAROSCOPY DIAGNOSTIC;  Surgeon: Lyndel Deward PARAS, MD;  Location: [facility] OR;  Service: General;  Laterality: N/A;   LAPAROTOMY N/A 08/08/2022   Procedure: EXPLORATORY LAPAROTOMY;  Surgeon: Lyndel Deward PARAS, MD;  Location: [facility] OR;  Service: General;  Laterality: N/A;   SMALL INTESTINE SURGERY  03/05/2019   08/09/22   WRIST FRACTURE SURGERY     Social History   Tobacco Use   Smoking status: Never   Smokeless tobacco: Never  Vaping Use   Vaping status: Never Used  Substance Use Topics   Alcohol use: No   Drug use: No   Family History  Problem Relation Age of Onset   Hyperthyroidism Mother    Anxiety disorder Mother    Obesity Mother    Vision loss Mother    Learning disabilities Father    Hypertension Father    Hyperlipidemia Father    Heart disease Father    Heart failure Father    Diabetes Father    Anxiety disorder Father    Depression Father    Drug abuse Father    Intellectual disability Father    Multiple sclerosis Sister    Depression Paternal Uncle    Drug abuse Paternal Uncle    Early death Paternal Uncle    Diabetes Maternal Grandmother    Intellectual disability Maternal Grandmother    Hypertension Maternal Grandmother    Hyperlipidemia Maternal Grandmother    Heart disease Maternal Grandmother    Diabetes Paternal Grandmother    Learning disabilities Son    Depression Son    Depression Son    Learning disabilities Son    Anxiety disorder Son    Drug abuse Son    Alcohol abuse Maternal Uncle    Colon cancer Neg Hx    Stomach cancer Neg Hx    Esophageal cancer Neg Hx    Pancreatic cancer Neg Hx    Allergies  Allergen Reactions   Morphine  Other (See Comments) and Itching   Lamotrigine  Other (See Comments)    Patient had headaches, abdominal cramping   Protein Other (See Comments)   Wellbutrin Xl [Bupropion] Other (See Comments)    Abdominal pain   Latex Rash and Other (See Comments)     Burning    ROS Negative unless stated above    Objective:     BP 94/78 (BP Location: Left Arm, Patient Position: Sitting, Cuff Size: Large)   Pulse 94   Temp 98.9 F (37.2 C) (Oral)   Ht 5' 7.5 (1.715 m)   Wt 245 lb 3.2 oz (111.2 kg)   LMP  (LMP Unknown)   SpO2 97%   BMI 37.84 kg/m  BP Readings from Last 3 Encounters:  08/26/24 94/78  07/29/24 107/69  07/28/24 98/72   Wt Readings from Last 3 Encounters:  08/26/24 245 lb 3.2 oz (111.2 kg)  07/28/24 243 lb 12.8 oz (110.6 kg)  06/30/24 245 lb 12.8 oz (111.5 kg)      Physical Exam Constitutional:  General: She is not in acute distress.    Appearance: Normal appearance.  HENT:     Head: Normocephalic and atraumatic.     Nose: Nose normal.     Mouth/Throat:     Mouth: Mucous membranes are moist.  Cardiovascular:     Rate and Rhythm: Normal rate and regular rhythm.     Heart sounds: Normal heart sounds. No murmur heard.    No gallop.  Pulmonary:     Effort: Pulmonary effort is normal. No respiratory distress.     Breath sounds: Normal breath sounds. No wheezing, rhonchi or rales.  Genitourinary:    Rectum: Tenderness and external hemorrhoid present.     Comments: DRE deferred due to pt discomfort. Skin:    General: Skin is warm and dry.  Neurological:     Mental Status: She is alert and oriented to person, place, and time.        07/28/2024    9:36 AM 06/30/2024    8:59 AM 12/30/2023    3:08 PM  Depression screen PHQ 2/9  Decreased Interest 2 1 3   Down, Depressed, Hopeless 2 1 3   PHQ - 2 Score 4 2 6   Altered sleeping 2 0 3  Tired, decreased energy 2 2 3   Change in appetite 1 1 0  Feeling bad or failure about yourself  0 1 0  Trouble concentrating 1 1 3   Moving slowly or fidgety/restless 0 0 0  Suicidal thoughts 0 0 0  PHQ-9 Score 10 7 15   Difficult doing work/chores Very difficult Somewhat difficult       07/28/2024    9:36 AM 06/30/2024    9:00 AM 12/30/2023    3:10 PM 12/03/2023    4:55 PM  GAD  7 : Generalized Anxiety Score  Nervous, Anxious, on Edge 1 1 2 1   Control/stop worrying 1 0 2 0  Worry too much - different things 1 0 2 1  Trouble relaxing 1 1 3 1   Restless 0 0 0 0  Easily annoyed or irritable 0 0 0 0  Afraid - awful might happen 3 1 3 1   Total GAD 7 Score 7 3 12 4   Anxiety Difficulty Very difficult Somewhat difficult  Somewhat difficult     No results found for any visits on 08/26/24.    Assessment & Plan:   Hemorrhoids, unspecified hemorrhoid type -     nifedipine  0.3 % ointment; Place 1 Application rectally as directed.  Dispense: 30 g; Refill: 0  Hemorrhoid with question of anal fissure.  Start nifedipine  ointment.  Continue supportive care with sitz baths, wipes, stool softeners, etc.  See if GI appt can be moved.  Give strict precautions.  Return if symptoms worsen or fail to improve.   Clotilda JONELLE Single, MD

## 2024-09-24 ENCOUNTER — Other Ambulatory Visit (HOSPITAL_BASED_OUTPATIENT_CLINIC_OR_DEPARTMENT_OTHER): Payer: Self-pay

## 2024-09-29 ENCOUNTER — Other Ambulatory Visit (HOSPITAL_BASED_OUTPATIENT_CLINIC_OR_DEPARTMENT_OTHER): Payer: Self-pay

## 2024-09-29 ENCOUNTER — Ambulatory Visit: Admitting: Physician Assistant

## 2024-10-24 ENCOUNTER — Encounter: Payer: Self-pay | Admitting: Radiology

## 2024-10-27 ENCOUNTER — Other Ambulatory Visit (HOSPITAL_BASED_OUTPATIENT_CLINIC_OR_DEPARTMENT_OTHER): Payer: Self-pay

## 2024-11-01 ENCOUNTER — Ambulatory Visit: Admitting: Gastroenterology

## 2024-11-08 ENCOUNTER — Other Ambulatory Visit (HOSPITAL_BASED_OUTPATIENT_CLINIC_OR_DEPARTMENT_OTHER): Payer: Self-pay

## 2024-11-08 MED ORDER — SOD FLUORIDE-POTASSIUM NITRATE 1.1-5 % DT GEL
DENTAL | 5 refills | Status: AC
  Filled 2024-11-08: qty 100, 30d supply, fill #0

## 2024-11-09 ENCOUNTER — Other Ambulatory Visit (HOSPITAL_BASED_OUTPATIENT_CLINIC_OR_DEPARTMENT_OTHER): Payer: Self-pay

## 2024-11-10 ENCOUNTER — Other Ambulatory Visit (HOSPITAL_BASED_OUTPATIENT_CLINIC_OR_DEPARTMENT_OTHER): Payer: Self-pay

## 2024-11-26 ENCOUNTER — Encounter (HOSPITAL_BASED_OUTPATIENT_CLINIC_OR_DEPARTMENT_OTHER): Payer: Self-pay | Admitting: Emergency Medicine

## 2024-11-26 ENCOUNTER — Other Ambulatory Visit (HOSPITAL_BASED_OUTPATIENT_CLINIC_OR_DEPARTMENT_OTHER): Payer: Self-pay

## 2024-11-26 ENCOUNTER — Other Ambulatory Visit: Payer: Self-pay

## 2024-11-26 ENCOUNTER — Emergency Department (HOSPITAL_BASED_OUTPATIENT_CLINIC_OR_DEPARTMENT_OTHER)
Admission: EM | Admit: 2024-11-26 | Discharge: 2024-11-26 | Disposition: A | Discharge status: Home / Self Care | Attending: Emergency Medicine | Admitting: Emergency Medicine

## 2024-11-26 DIAGNOSIS — X501XXA Overexertion from prolonged static or awkward postures, initial encounter: Secondary | ICD-10-CM | POA: Insufficient documentation

## 2024-11-26 DIAGNOSIS — M5417 Radiculopathy, lumbosacral region: Secondary | ICD-10-CM | POA: Insufficient documentation

## 2024-11-26 DIAGNOSIS — Z91040 Latex allergy status: Secondary | ICD-10-CM | POA: Insufficient documentation

## 2024-11-26 DIAGNOSIS — Y9239 Other specified sports and athletic area as the place of occurrence of the external cause: Secondary | ICD-10-CM | POA: Insufficient documentation

## 2024-11-26 MED ORDER — LIDOCAINE 5 % EX PTCH
1.0000 | MEDICATED_PATCH | CUTANEOUS | Status: DC
  Administered 2024-11-26: 1 via TRANSDERMAL
  Filled 2024-11-26: qty 1

## 2024-11-26 MED ORDER — PREDNISONE 10 MG PO TABS
50.0000 mg | ORAL_TABLET | Freq: Every day | ORAL | 0 refills | Status: DC
  Filled 2024-11-26: qty 25, 5d supply, fill #0

## 2024-11-26 MED ORDER — NAPROXEN 250 MG PO TABS
500.0000 mg | ORAL_TABLET | Freq: Once | ORAL | Status: AC
  Administered 2024-11-26: 500 mg via ORAL
  Filled 2024-11-26: qty 2

## 2024-11-26 MED ORDER — METHOCARBAMOL 500 MG PO TABS
500.0000 mg | ORAL_TABLET | Freq: Two times a day (BID) | ORAL | 0 refills | Status: AC
  Filled 2024-11-26: qty 20, 10d supply, fill #0

## 2024-11-26 MED ORDER — NAPROXEN 500 MG PO TABS
500.0000 mg | ORAL_TABLET | Freq: Two times a day (BID) | ORAL | 0 refills | Status: AC
  Filled 2024-11-26: qty 20, 10d supply, fill #0

## 2024-11-26 MED ORDER — ACETAMINOPHEN 500 MG PO TABS
500.0000 mg | ORAL_TABLET | Freq: Four times a day (QID) | ORAL | 0 refills | Status: AC | PRN
  Filled 2024-11-26: qty 30, 8d supply, fill #0

## 2024-11-26 MED ORDER — LIDOCAINE 4 % EX PTCH
1.0000 | MEDICATED_PATCH | Freq: Two times a day (BID) | CUTANEOUS | 0 refills | Status: AC
  Filled 2024-11-26: qty 12, 6d supply, fill #0

## 2024-11-26 MED ORDER — ACETAMINOPHEN 325 MG PO TABS
650.0000 mg | ORAL_TABLET | Freq: Once | ORAL | Status: AC
  Administered 2024-11-26: 650 mg via ORAL
  Filled 2024-11-26: qty 2

## 2024-11-26 NOTE — ED Provider Notes (Signed)
 [place] [facility]ER[facility] DEPARTMENT AT [facility] Provider Note   CSN: [id number] Arrival date & time: 11/26/24  0710     Patient presents with: Back Pain   Krista Fisher is a 48 y.o. female.   HPI    48 year old female comes in with chief complaint of back pain.  Patient has no prior back injury.  2 days ago patient was at gym.  In the morning when she was doing back extension, she heard a pop.  She had no pain immediately.  However in the afternoon she started having some gluteal discomfort on the left side.  With time, despite taking ibuprofen  she has noted increasing pain radiating down towards the knee, primarily over the lateral aspect of the thigh and also some numbness and tingling in that area.  Symptoms are worse with activity.  Patient also feels that the leg is heavier.  No urinary incontinence or retention, saddle anesthesia.   Prior to Admission medications   Medication Sig Start Date End Date Taking? Authorizing Provider  acetaminophen  (TYLENOL ) 500 MG tablet Take 1 tablet (500 mg total) by mouth every 6 (six) hours as needed. 11/26/24  Yes Charlyn Sora, MD  lidocaine  4 % Place 1 patch onto the skin 2 (two) times daily. 11/26/24  Yes Charlyn Sora, MD  methocarbamol  (ROBAXIN ) 500 MG tablet Take 1 tablet (500 mg total) by mouth 2 (two) times daily. 11/26/24  Yes Charlyn Sora, MD  naproxen  (NAPROSYN ) 500 MG tablet Take 1 tablet (500 mg total) by mouth 2 (two) times daily. 11/26/24  Yes Charlyn Sora, MD  predniSONE  (DELTASONE ) 10 MG tablet Take 5 tablets (50 mg total) by mouth daily. 11/26/24  Yes Charlyn Sora, MD  ALPRAZolam  (XANAX ) 0.5 MG tablet Take 1 tablet (0.5 mg total dose) daily as needed for panic attacks. 02/25/24   Mercer Clotilda SAUNDERS, MD  Calcium  Carb-Cholecalciferol  600-10 MG-MCG TABS Take 1 tablet by mouth daily. Patient not taking: Reported on 08/26/2024 01/25/20   [provider]  cetirizine (ZYRTEC) 10 MG tablet Take 10 mg by mouth  daily.    [provider]  COLLAGEN PO Take 1 tablet by mouth daily.     [provider]  cyanocobalamin  (VITAMIN B12) 500 MCG tablet Take 1,000 mcg by mouth daily. Patient not taking: Reported on 08/26/2024    [provider]  escitalopram  (LEXAPRO ) 20 MG tablet Take 1 tablet (20 mg total) by mouth daily. 04/24/23     etonogestrel (NEXPLANON) 68 MG IMPL implant 1 each by Subdermal route once. 06/06/20   [provider]  ferrous sulfate 325 (65 FE) MG tablet Take 325 mg by mouth daily with breakfast.    [provider]  fluconazole  (DIFLUCAN ) 150 MG tablet Take 1 tablet (150 mg total) by mouth every 3 (three) days. Patient not taking: Reported on 08/26/2024 06/11/24   Rolan Berthold, PA-C  fluticasone  (FLONASE ) 50 MCG/ACT nasal spray Place 1 spray into both nostrils daily.    [provider]  Ginger, Zingiber officinalis, (GINGER PO) Take 1 tablet by mouth daily. Patient not taking: Reported on 08/26/2024    [provider]  hyoscyamine  (LEVSIN  SL) 0.125 MG SL tablet Place 1 tablet (0.125 mg total) under the tongue every 4 (four) hours as needed for cramping for upto 10 days. 08/31/23     ibuprofen  (ADVIL ) 400 MG tablet Take 1 tablet (400 mg total) by mouth every 6 (six) hours as needed. 07/29/24   Dasie Faden, MD  magnesium  30 MG tablet  Take 200 mg by mouth 2 (two) times daily. Patient not taking: Reported on 08/26/2024    [provider]  metaxalone  (SKELAXIN ) 800 MG tablet Take 1 tablet (800 mg total) by mouth 3 (three) times daily. Patient taking differently: Take 800 mg by mouth 3 (three) times daily. As needed. 07/29/24   Dasie Faden, MD  Multiple Vitamins-Iron  (MULTIVITAMINS WITH IRON ) TABS tablet Take 1 tablet by mouth daily.    [provider]  nifedipine  0.3 % ointment Place 1 Application rectally as directed. 08/26/24   Mercer Clotilda SAUNDERS, MD  omeprazole  (PRILOSEC) 40 MG capsule Take 1 capsule (40 mg total) by mouth in  the morning before meal. 04/24/23     ondansetron  (ZOFRAN -ODT) 4 MG disintegrating tablet Take 1 tablet (4 mg total) by mouth every 8 (eight) hours as needed for nausea or vomiting. 07/23/23   Silver Wonda LABOR, PA  QUEtiapine Fumarate (SEROQUEL XR) 150 MG 24 hr tablet Take 150 mg by mouth. 03/22/24   [provider]  Semaglutide -Weight Management (WEGOVY ) 0.5 MG/0.5ML SOAJ Inject 0.5 mg into the skin once a week. 02/10/24     Sod Fluoride -Potassium Nitrate  (PREVIDENT 5000 ENAMEL PROTECT) 1.1-5 % GEL Brush with twice daily. Do not swallow, do not eat or drink for 30 minutes. 11/01/24     sucralfate  (CARAFATE ) 1 g tablet Take 1 tablet (1 g total) by mouth 3 (three) times daily before meals  Take on an empty stomach Patient taking differently: Take 1 g by mouth 3 (three) times daily before meals. As needed. 10/26/23     Turmeric (QC TUMERIC COMPLEX PO) Take 1 tablet by mouth daily. Patient not taking: Reported on 08/26/2024    [provider]  Vitamin A  (A-25 PO) Take 1 tablet by mouth daily.    [provider]  zinc  gluconate 50 MG tablet Take 50 mg by mouth daily.    [provider]    Allergies: Morphine , Lamotrigine , Protein, Wellbutrin xl [bupropion], and Latex    Review of Systems  All other systems reviewed and are negative.   Updated Vital Signs BP 122/74   Pulse 83   Temp 98.2 F (36.8 C)   Resp 20   Ht 5' 6 (1.676 m)   Wt 111.1 kg   SpO2 98%   BMI 39.54 kg/m   Physical Exam Vitals and nursing note reviewed.  Constitutional:      Appearance: She is well-developed.  HENT:     Head: Atraumatic.  Cardiovascular:     Rate and Rhythm: Normal rate.  Pulmonary:     Effort: Pulmonary effort is normal.  Musculoskeletal:     Cervical back: Normal range of motion and neck supple.     Comments: Pt has tenderness over the lumbar region in the L5-S1 region. No step offs, no erythema. Pt has 2+ patellar reflex in the left lower extremity Patient has  subjective numbness to the left lateral thigh Able to ambulate   Skin:    General: Skin is warm and dry.  Neurological:     Mental Status: She is alert and oriented to person, place, and time.     (all labs ordered are listed, but only abnormal results are displayed) Labs Reviewed - No data to display  EKG: None  Radiology: No results found.   Procedures   Medications Ordered in the ED  naproxen  (NAPROSYN ) tablet 500 mg (has no administration in time range)  acetaminophen  (TYLENOL ) tablet 650 mg (has no administration in time  range)  lidocaine  (LIDODERM ) 5 % 1 patch (has no administration in time range)                                    Medical Decision Making Risk OTC drugs. Prescription drug management.   48 year old patient comes in with cc of acute back pain. Patient has pertinent past medical history of no previous spine injury or similar symptoms.  No red flags suggesting cord compression.   Differential diagnosis considered for this patient includes: - Degenerative disease of the back - Spondylitises/ spondylosis - Sciatica - Spinal cord compression / cord syndrome - Conus medullaris - Myelitis - Musculoskeletal pain  I have reviewed patient's previous records including past medical history and medications.  Patient is immunocompetent. Based on history, exam our initial plan was to start treating for what appears to be lumbar radiculopathy, and have patient follow-up with PCP if symptoms are not improving in 7 to 10 days.  I discussed with the patient that if her symptoms are not improving, then PCP can consider physical therapy, advanced imaging or consultation with specialist.  Return precautions discussed and also will be printed in the AVS. X-ray of the spine considered, but thought to be unnecessary given low risk for metastatic disease and compression fractures/pathologic fractures.  ASSESSMENT:  Suspect lumbar strain, with radiculopathy, and  spondylolysis  PLAN: For acute pain, rest, intermittent application of cold packs (later, may switch to heat, but do not sleep on heating pad), analgesics and muscle relaxants are recommended. Discussed longer term treatment plan of prn NSAID's and discussed a home back care exercise program with flexion exercise routine. Proper lifting with avoidance of heavy lifting discussed. Consider PCP followup for Physical Therapy and XRay studies if not improving.  Strict ER return precautions discussed.  Patient will return to the ER if they start developing worsening pain, new neurologic symptoms, urinary incontinence, retention, weakness or numbness.   Final diagnoses:  Lumbosacral radiculopathy    ED Discharge Orders          Ordered    lidocaine  4 %  2 times daily        11/26/24 0855    naproxen  (NAPROSYN ) 500 MG tablet  2 times daily        11/26/24 0855    acetaminophen  (TYLENOL ) 500 MG tablet  Every 6 hours PRN        11/26/24 0855    methocarbamol  (ROBAXIN ) 500 MG tablet  2 times daily        11/26/24 0855    predniSONE  (DELTASONE ) 10 MG tablet  Daily        11/26/24 0855               Charlyn Sora, MD 11/26/24 [phone]

## 2024-11-26 NOTE — Discharge Instructions (Signed)
We saw you in the ER for back pain. Fortunately, our evaluation is not concerning for emergent pathology such as spinal cord compression or infection.   Often the pain is self limiting, you just need time and supportive medications such as ibuprofen every 6-8 hours for the next few days. Take the muscle relaxant as needed, and see your primary care doctor for further management if the symptoms continue to linger.   Please use the back exercises to strengthen the back muscles and be very careful with activities in future to prevent similar painful events.  Please return to the ER if your pain becomes excruciating or you start developing new numbness, weakness, urinary incontinence (peeing on self without warning), urinary retention (not able to pee despite bladder feeling full), inability to defecate, pins and needles sensation by your ano-genital area.

## 2024-11-26 NOTE — ED Notes (Signed)
 ED Provider at bedside.

## 2024-11-26 NOTE — ED Triage Notes (Signed)
 Pt caox4 ambulatory c/o L lower back pain radiating to L thigh further stating she was at the gym Thursday and heard a pop and has been having spasms since.

## 2025-01-04 ENCOUNTER — Ambulatory Visit: Admitting: Family Medicine

## 2025-01-04 ENCOUNTER — Encounter: Payer: Self-pay | Admitting: Family Medicine

## 2025-01-04 VITALS — BP 98/74 | HR 88 | Temp 98.8°F | Ht 66.0 in | Wt 238.0 lb

## 2025-01-04 DIAGNOSIS — R29898 Other symptoms and signs involving the musculoskeletal system: Secondary | ICD-10-CM

## 2025-01-04 DIAGNOSIS — M545 Low back pain, unspecified: Secondary | ICD-10-CM

## 2025-01-04 DIAGNOSIS — E66812 Obesity, class 2: Secondary | ICD-10-CM

## 2025-01-04 DIAGNOSIS — R4189 Other symptoms and signs involving cognitive functions and awareness: Secondary | ICD-10-CM | POA: Diagnosis not present

## 2025-01-04 DIAGNOSIS — Z9889 Other specified postprocedural states: Secondary | ICD-10-CM

## 2025-01-04 DIAGNOSIS — K5900 Constipation, unspecified: Secondary | ICD-10-CM | POA: Diagnosis not present

## 2025-01-04 DIAGNOSIS — Z6838 Body mass index (BMI) 38.0-38.9, adult: Secondary | ICD-10-CM

## 2025-01-04 LAB — CBC WITH DIFFERENTIAL/PLATELET
Basophils Absolute: 0 K/uL (ref 0.0–0.1)
Basophils Relative: 0.5 % (ref 0.0–3.0)
Eosinophils Absolute: 0.1 K/uL (ref 0.0–0.7)
Eosinophils Relative: 1.9 % (ref 0.0–5.0)
HCT: 39.2 % (ref 36.0–46.0)
Hemoglobin: 13.5 g/dL (ref 12.0–15.0)
Lymphocytes Relative: 24.8 % (ref 12.0–46.0)
Lymphs Abs: 1.8 K/uL (ref 0.7–4.0)
MCHC: 34.3 g/dL (ref 30.0–36.0)
MCV: 79.6 fl (ref 78.0–100.0)
Monocytes Absolute: 0.7 K/uL (ref 0.1–1.0)
Monocytes Relative: 9.6 % (ref 3.0–12.0)
Neutro Abs: 4.5 K/uL (ref 1.4–7.7)
Neutrophils Relative %: 63.2 % (ref 43.0–77.0)
Platelets: 250 K/uL (ref 150.0–400.0)
RBC: 4.93 Mil/uL (ref 3.87–5.11)
RDW: 14 % (ref 11.5–15.5)
WBC: 7.1 K/uL (ref 4.0–10.5)

## 2025-01-04 LAB — COMPREHENSIVE METABOLIC PANEL WITH GFR
ALT: 41 U/L — ABNORMAL HIGH (ref 3–35)
AST: 51 U/L — ABNORMAL HIGH (ref 5–37)
Albumin: 4.2 g/dL (ref 3.5–5.2)
Alkaline Phosphatase: 136 U/L — ABNORMAL HIGH (ref 39–117)
BUN: 9 mg/dL (ref 6–23)
CO2: 29 meq/L (ref 19–32)
Calcium: 9.6 mg/dL (ref 8.4–10.5)
Chloride: 107 meq/L (ref 96–112)
Creatinine, Ser: 1.04 mg/dL (ref 0.40–1.20)
GFR: 63.3 mL/min
Glucose, Bld: 86 mg/dL (ref 70–99)
Potassium: 4.5 meq/L (ref 3.5–5.1)
Sodium: 141 meq/L (ref 135–145)
Total Bilirubin: 0.4 mg/dL (ref 0.2–1.2)
Total Protein: 7.6 g/dL (ref 6.0–8.3)

## 2025-01-04 LAB — TSH: TSH: 1.34 u[IU]/mL (ref 0.35–5.50)

## 2025-01-04 LAB — FOLATE: Folate: 21.4 ng/mL

## 2025-01-04 LAB — VITAMIN B12: Vitamin B-12: >1500 pg/mL — ABNORMAL HIGH (ref 211–911)

## 2025-01-04 LAB — VITAMIN D 25 HYDROXY (VIT D DEFICIENCY, FRACTURES): VITD: 68.09 ng/mL (ref 30.00–100.00)

## 2025-01-04 NOTE — Progress Notes (Signed)
 "  Established Patient Office Visit   Subjective  Patient ID: Krista Fisher, female    DOB: 07-Jul-1976  Age: 49 y.o. MRN: [id number]  Chief Complaint  Patient presents with   Medical Management of Chronic Issues    Patient came in today for aphasia, and FMLA paperwork     Pt is a 49 year old female seen for follow-up on ongoing concerns.   In December, she sustained an iliopsoas muscle strain while using a back extension apparatus at the gym. Initially, she experienced severe pain and muscle spasms with any movement, but the pain since improved significantly.Continued weakness inLLE, particularly when lifting it, and a numbing sensation on top of the leg. Walking is manageable, but she struggles with activities like getting into a car due to the weakness. Occasional sharp, piercing pain occurs if she stretches or turns the wrong way. There is some back pain localized to the same area as the groin pain, but no generalized back pain. No numbness or tingling when sitting, and no sensation of the leg giving out while walking. No loss of bowel or bladder control.  She reports experiencing brain fog and difficulty with memory and word-finding since her gastric issues. She describes walking into rooms and forgetting her purpose, and having trouble learning new tasks at her job. She attributes these symptoms to her past gastric issues but also considers menopause as a potential cause. She feels the brain fog is worsening.  Has forms to request accommodations at work.  Taking a bariatric multivitamin at night. She feels drained and exhausted since restarting Wegovy  after a six-week break, during which she gained seven pounds. She also takes phentermine , which she feels is necessary to maintain her energy levels for work.  On phentermine  x 1+ yrs. Seen by [facility] for wt management.  On a 1200 calorie diet but struggles to meet this due to appetite suppression from Wegovy .  She has been experiencing  some pressure in the liver area and reports incomplete bowel movements despite using Miralax  regularly. She is concerned about her liver function.    Patient Active Problem List   Diagnosis Date Noted   Abnormal LFTs 11/13/2023   History of cholecystectomy 11/13/2023   Dilation of biliary tract 11/13/2023   Liver lesion 11/13/2023   Abnormal CT scan 11/13/2023   Iron  deficiency anemia 11/13/2023   Hiatal hernia 11/13/2023   Colon cancer screening 11/13/2023   Tongue pain 11/13/2023   Pancreatitis 06/24/2023   S/P hernia surgery 08/19/2022   S/P small bowel resection 08/19/2022   Hospital discharge follow-up 08/19/2022   Fibroid 09/06/2020   Benign neoplasm of connective and other soft tissue, unspecified 09/06/2020   Defecation symptom 07/02/2020   Rectal pain 06/04/2020   Anal fissure 04/05/2020   Hemorrhoids 04/05/2020   Dysmenorrhea 02/07/2020   Polycystic ovary syndrome 02/07/2020   Status post biliopancreatic diversion with duodenal switch 01/17/2020   Excessive daytime sleepiness 01/17/2020   Hypersomnia with sleep apnea 01/17/2020   Loud snoring 01/17/2020   Morbid obesity with body mass index of 50 or higher (HCC) 01/17/2020   Morbid obesity with BMI of 40.0-44.9, adult (HCC) 09/15/2019   Arm numbness 03/08/2018   Anxiety state 04/11/2014   Intramural leiomyoma of uterus 11/03/2013   Menorrhagia 11/03/2013   Knee pain 05/28/2013   Osteoarthrosis involving lower leg 05/06/2011   EDEMA 02/04/2011   CHEST PAIN-UNSPECIFIED 02/04/2011   Vitamin D  deficiency 12/03/2009   Allergic rhinitis 12/03/2009   Morbid obesity (HCC) 10/31/2009  Benign essential hypertension 10/31/2009   BLOCK, OTHER HEART 10/31/2009   URI 10/31/2009   Gastro-esophageal reflux disease without esophagitis 10/31/2009   SLEEP APNEA 10/31/2009   PALPITATIONS 10/31/2009   1st degree AV block 10/31/2009   Major depressive disorder, single episode, unspecified 10/31/2009   Fatty liver disease,  nonalcoholic 12/22/2008   Chronic joint pain 12/22/2008   Low back pain 04/06/1999   Depressive disorder 04/05/1994   Chronic post-traumatic stress disorder (PTSD) 04/05/1994   Past Medical History:  Diagnosis Date   Allergy 12/22/1993   Anemia 12/22/1993   Anxiety 12/22/1993   Depression 12/22/1993   GERD (gastroesophageal reflux disease) 12/22/1993   Hypertension 12/22/1993   During recent hospitilization 07/2299   Sleep apnea 12/22/1993   Past Surgical History:  Procedure Laterality Date   CESAREAN SECTION     CHOLECYSTECTOMY  04/22/2011   GASTRECTOMY     IR ANGIOGRAM PELVIS SELECTIVE OR SUPRASELECTIVE  09/06/2020   IR ANGIOGRAM SELECTIVE EACH ADDITIONAL VESSEL  09/06/2020   IR ANGIOGRAM SELECTIVE EACH ADDITIONAL VESSEL  09/06/2020   IR EMBO TUMOR ORGAN ISCHEMIA INFARCT INC GUIDE ROADMAPPING  09/06/2020   IR RADIOLOGIST EVAL & MGMT  07/11/2020   IR RADIOLOGIST EVAL & MGMT  09/19/2020   IR RADIOLOGIST EVAL & MGMT  05/08/2021   IR US  GUIDE VASC ACCESS LEFT  09/06/2020   KNEE SURGERY     LAPAROSCOPIC GASTRIC RESTRICTIVE DUODENAL PROCEDURE (DUODENAL SWITCH)     LAPAROSCOPY N/A 08/08/2022   Procedure: LAPAROSCOPY DIAGNOSTIC;  Surgeon: Lyndel Deward PARAS, MD;  Location: [facility] OR;  Service: General;  Laterality: N/A;   LAPAROTOMY N/A 08/08/2022   Procedure: EXPLORATORY LAPAROTOMY;  Surgeon: Lyndel Deward PARAS, MD;  Location: [facility] OR;  Service: General;  Laterality: N/A;   SMALL INTESTINE SURGERY  03/05/2019   08/09/22   WRIST FRACTURE SURGERY     Social History[1] Family History  Problem Relation Age of Onset   Hyperthyroidism Mother    Anxiety disorder Mother    Obesity Mother    Vision loss Mother    Learning disabilities Father    Hypertension Father    Hyperlipidemia Father    Heart disease Father    Heart failure Father    Diabetes Father    Anxiety disorder Father    Depression Father    Drug abuse Father    Intellectual disability Father    Multiple sclerosis  Sister    Depression Paternal Uncle    Drug abuse Paternal Uncle    Early death Paternal Uncle    Diabetes Maternal Grandmother    Intellectual disability Maternal Grandmother    Hypertension Maternal Grandmother    Hyperlipidemia Maternal Grandmother    Heart disease Maternal Grandmother    Diabetes Paternal Grandmother    Learning disabilities Son    Depression Son    Depression Son    Learning disabilities Son    Anxiety disorder Son    Drug abuse Son    Alcohol abuse Maternal Uncle    Colon cancer Neg Hx    Stomach cancer Neg Hx    Esophageal cancer Neg Hx    Pancreatic cancer Neg Hx    Allergies[2]  ROS Negative unless stated above    Objective:     BP 98/74 (BP Location: Left Arm, Patient Position: Sitting, Cuff Size: Large)   Pulse 88   Temp 98.8 F (37.1 C) (Oral)   Ht 5' 6 (1.676 m)   Wt 238 lb (108 kg)   SpO2 98%  BMI 38.41 kg/m  BP Readings from Last 3 Encounters:  01/04/25 98/74  11/26/24 125/62  08/26/24 94/78   Wt Readings from Last 3 Encounters:  01/04/25 238 lb (108 kg)  11/26/24 245 lb (111.1 kg)  08/26/24 245 lb 3.2 oz (111.2 kg)      Physical Exam Constitutional:      General: She is not in acute distress.    Appearance: Normal appearance.  HENT:     Head: Normocephalic and atraumatic.     Nose: Nose normal.     Mouth/Throat:     Mouth: Mucous membranes are moist.  Cardiovascular:     Rate and Rhythm: Normal rate and regular rhythm.     Heart sounds: Normal heart sounds. No murmur heard.    No gallop.  Pulmonary:     Effort: Pulmonary effort is normal. No respiratory distress.     Breath sounds: Normal breath sounds. No wheezing, rhonchi or rales.  Musculoskeletal:     Comments: RLE 5/5 strength, LLE difficulty initiating movement then 4/5 strength  Skin:    General: Skin is warm and dry.  Neurological:     Mental Status: She is alert and oriented to person, place, and time.        07/28/2024    9:36 AM 06/30/2024     8:59 AM 12/30/2023    3:08 PM  Depression screen PHQ 2/9  Decreased Interest 2 1 3   Down, Depressed, Hopeless 2 1 3   PHQ - 2 Score 4 2 6   Altered sleeping 2 0 3  Tired, decreased energy 2 2 3   Change in appetite 1 1 0  Feeling bad or failure about yourself  0 1 0  Trouble concentrating 1 1 3   Moving slowly or fidgety/restless 0 0 0  Suicidal thoughts 0 0 0  PHQ-9 Score 10  7  15    Difficult doing work/chores Very difficult Somewhat difficult      Data saved with a previous flowsheet row definition      07/28/2024    9:36 AM 06/30/2024    9:00 AM 12/30/2023    3:10 PM 12/03/2023    4:55 PM  GAD 7 : Generalized Anxiety Score  Nervous, Anxious, on Edge 1 1 2 1   Control/stop worrying 1 0 2 0  Worry too much - different things 1 0 2 1  Trouble relaxing 1 1 3 1   Restless 0 0 0 0  Easily annoyed or irritable 0 0 0 0  Afraid - awful might happen 3 1 3 1   Total GAD 7 Score 7 3 12 4   Anxiety Difficulty Very difficult Somewhat difficult  Somewhat difficult     No results found for any visits on 01/04/25.    Assessment & Plan:   Left-sided low back pain without sciatica, unspecified chronicity -     CBC with Differential/Platelet; Future -     Ambulatory referral to Physical Therapy  Weakness of left lower extremity -     CBC with Differential/Platelet; Future -     TSH; Future -     Ambulatory referral to Physical Therapy  Brain fog -     Vitamin B12; Future -     VITAMIN D  25 Hydroxy (Vit-D Deficiency, Fractures); Future -     CBC with Differential/Platelet; Future -     TSH; Future -     Folate; Future -     Zinc ; Future  Constipation, unspecified constipation type -     TSH;  Future -     Comprehensive metabolic panel with GFR; Future  Class 2 severe obesity with serious comorbidity and body mass index (BMI) of 38.0 to 38.9 in adult, unspecified obesity type -     Vitamin B12; Future -     VITAMIN D  25 Hydroxy (Vit-D Deficiency, Fractures); Future -     TSH;  Future -     Folate; Future -     Comprehensive metabolic panel with GFR; Future  S/P gastric surgery -     Vitamin B12; Future -     VITAMIN D  25 Hydroxy (Vit-D Deficiency, Fractures); Future -     Folate; Future -     Comprehensive metabolic panel with GFR; Future -     Zinc ; Future  Ongoing low back pain after injury at the gym.  Continue chiropractor visits.  Subjective weakness in LLE.  Start PT.  For continued/worsening symptoms obtain MRI to evaluate for herniated disc/other causes of radiculopathy.  Supportive care encouraged.  Ongoing brain fog.  Possibly related to anxiety, menopause, increased stress.  Obtain labs to evaluate for reversible causes.  Will look into work accommodations such as decreased caseloads.  Patient provided forms this visit.  Continue daily bariatric multivitamin as status post gastric bypass.  Continue daily bowel regimen.  Patient advised to stop Wegovy  given GI symptoms and fatigue.  Also advised on need to stop phentermine  given prolonged use.  Follow-up with [facility] to discuss further.  Return if symptoms worsen or fail to improve.    Clotilda JONELLE Single, MD     [1]  Social History Tobacco Use   Smoking status: Never   Smokeless tobacco: Never  Vaping Use   Vaping status: Never Used  Substance Use Topics   Alcohol use: No   Drug use: No  [2]  Allergies Allergen Reactions   Morphine  Other (See Comments) and Itching   Lamotrigine  Other (See Comments)    Patient had headaches, abdominal cramping   Protein Other (See Comments)   Wellbutrin Xl [Bupropion] Other (See Comments)    Abdominal pain   Latex Rash and Other (See Comments)    Burning   "

## 2025-01-09 ENCOUNTER — Emergency Department (HOSPITAL_COMMUNITY)

## 2025-01-09 ENCOUNTER — Encounter: Payer: Self-pay | Admitting: Family Medicine

## 2025-01-09 ENCOUNTER — Encounter (HOSPITAL_COMMUNITY): Payer: Self-pay

## 2025-01-09 ENCOUNTER — Other Ambulatory Visit: Payer: Self-pay

## 2025-01-09 ENCOUNTER — Emergency Department (HOSPITAL_COMMUNITY)
Admission: EM | Admit: 2025-01-09 | Discharge: 2025-01-09 | Disposition: A | Discharge status: Home / Self Care | Attending: Emergency Medicine | Admitting: Emergency Medicine

## 2025-01-09 DIAGNOSIS — M5416 Radiculopathy, lumbar region: Secondary | ICD-10-CM | POA: Insufficient documentation

## 2025-01-09 DIAGNOSIS — Z91040 Latex allergy status: Secondary | ICD-10-CM | POA: Insufficient documentation

## 2025-01-09 DIAGNOSIS — M545 Low back pain, unspecified: Secondary | ICD-10-CM | POA: Diagnosis present

## 2025-01-09 MED ORDER — OXYCODONE-ACETAMINOPHEN 5-325 MG PO TABS: 1.0000 | ORAL_TABLET | Freq: Four times a day (QID) | ORAL | 0 refills | Status: AC | PRN

## 2025-01-09 MED ORDER — OXYCODONE-ACETAMINOPHEN 5-325 MG PO TABS: 1.0000 | ORAL_TABLET | Freq: Once | ORAL | Status: DC

## 2025-01-09 MED ORDER — PREDNISONE 20 MG PO TABS: 40.0000 mg | ORAL_TABLET | Freq: Every day | ORAL | 0 refills | Status: AC

## 2025-01-09 MED ORDER — IBUPROFEN 800 MG PO TABS
800.0000 mg | ORAL_TABLET | Freq: Once | ORAL | Status: AC
  Administered 2025-01-09: 800 mg via ORAL
  Filled 2025-01-09: qty 1

## 2025-01-09 MED ORDER — DIAZEPAM 2 MG PO TABS
2.0000 mg | ORAL_TABLET | Freq: Once | ORAL | Status: AC
  Administered 2025-01-09: 2 mg via ORAL
  Filled 2025-01-09: qty 1

## 2025-01-09 MED ORDER — OXYCODONE-ACETAMINOPHEN 5-325 MG PO TABS
2.0000 | ORAL_TABLET | Freq: Once | ORAL | Status: AC
  Administered 2025-01-09: 2 via ORAL
  Filled 2025-01-09: qty 2

## 2025-01-09 MED ORDER — DIAZEPAM 5 MG PO TABS: 5.0000 mg | ORAL_TABLET | Freq: Two times a day (BID) | ORAL | 0 refills | Status: AC | PRN

## 2025-01-09 MED ORDER — DEXAMETHASONE SOD PHOSPHATE PF 10 MG/ML IJ SOLN
10.0000 mg | Freq: Once | INTRAMUSCULAR | Status: AC
  Administered 2025-01-09: 10 mg via INTRAMUSCULAR
  Filled 2025-01-09: qty 1

## 2025-01-09 NOTE — ED Provider Notes (Signed)
 "  EMERGENCY DEPARTMENT AT [facility] Provider Note   CSN: [id number] Arrival date & time: 01/09/25  1411     Patient presents with: Back Pain   Krista Fisher is a 49 y.o. female.  She has been having a month worth of low back pain radiating into the left groin.  It was getting better with chiropractic manipulation.  Today had another manipulation and her pain is severe.  Involves buttock left hip into left groin.  Some numbness on the left thigh.  Does not extend below the knee.  No bowel or bladder incontinence.  {Add pertinent medical, surgical, social history, OB history to YEP:67052} The history is provided by the patient.  Back Pain Location:  Gluteal region Quality:  Aching Radiates to:  L thigh Pain severity:  Severe Onset quality:  Gradual Duration:  1 day Timing:  Constant Progression:  Unchanged Chronicity:  Recurrent Relieved by:  Nothing Associated symptoms: numbness and tingling   Associated symptoms: no abdominal pain, no bladder incontinence, no bowel incontinence, no dysuria and no fever        Prior to Admission medications  Medication Sig Start Date End Date Taking? Authorizing Provider  acetaminophen  (TYLENOL ) 500 MG tablet Take 1 tablet (500 mg total) by mouth every 6 (six) hours as needed. 11/26/24   Charlyn Sora, MD  ALPRAZolam  (XANAX ) 0.5 MG tablet Take 1 tablet (0.5 mg total dose) daily as needed for panic attacks. 02/25/24   Mercer Clotilda SAUNDERS, MD  Calcium  Carb-Cholecalciferol  600-10 MG-MCG TABS Take 1 tablet by mouth daily. Patient not taking: Reported on 01/04/2025 01/25/20   [provider]  cetirizine (ZYRTEC) 10 MG tablet Take 10 mg by mouth daily.    [provider]  COLLAGEN PO Take 1 tablet by mouth daily.     [provider]  cyanocobalamin  (VITAMIN B12) 500 MCG tablet Take 1,000 mcg by mouth daily. Patient not taking: Reported on 01/04/2025    [provider]  escitalopram  (LEXAPRO ) 20 MG  tablet Take 1 tablet (20 mg total) by mouth daily. 04/24/23     etonogestrel (NEXPLANON) 68 MG IMPL implant 1 each by Subdermal route once. 06/06/20   [provider]  ferrous sulfate 325 (65 FE) MG tablet Take 325 mg by mouth daily with breakfast.    [provider]  fluticasone  (FLONASE ) 50 MCG/ACT nasal spray Place 1 spray into both nostrils daily.    [provider]  Ginger, Zingiber officinalis, (GINGER PO) Take 1 tablet by mouth daily. Patient not taking: Reported on 01/04/2025    [provider]  hyoscyamine  (LEVSIN  SL) 0.125 MG SL tablet Place 1 tablet (0.125 mg total) under the tongue every 4 (four) hours as needed for cramping for upto 10 days. 08/31/23     ibuprofen  (ADVIL ) 400 MG tablet Take 1 tablet (400 mg total) by mouth every 6 (six) hours as needed. 07/29/24   Dasie Faden, MD  lidocaine  4 % Place 1 patch onto the skin 2 (two) times daily. 11/26/24   Charlyn Sora, MD  magnesium  30 MG tablet Take 200 mg by mouth 2 (two) times daily.    [provider]  metaxalone  (SKELAXIN ) 800 MG tablet Take 1 tablet (800 mg total) by mouth 3 (three) times daily. 07/29/24   Dasie Faden, MD  methocarbamol  (ROBAXIN ) 500 MG tablet Take 1 tablet (500 mg total) by mouth 2 (two) times daily. 11/26/24   Charlyn Sora, MD  Multiple Vitamins-Iron  (MULTIVITAMINS WITH IRON ) TABS tablet  Take 1 tablet by mouth daily.    [provider]  naproxen  (NAPROSYN ) 500 MG tablet Take 1 tablet (500 mg total) by mouth 2 (two) times daily. 11/26/24   Charlyn Sora, MD  omeprazole  (PRILOSEC) 40 MG capsule Take 1 capsule (40 mg total) by mouth in the morning before meal. 04/24/23     ondansetron  (ZOFRAN -ODT) 4 MG disintegrating tablet Take 1 tablet (4 mg total) by mouth every 8 (eight) hours as needed for nausea or vomiting. 07/23/23   Silver Wonda LABOR, PA  QUEtiapine Fumarate (SEROQUEL XR) 150 MG 24 hr tablet Take 150 mg by mouth. 03/22/24   [provider]   Semaglutide -Weight Management (WEGOVY ) 0.5 MG/0.5ML SOAJ Inject 0.5 mg into the skin once a week. 02/10/24     Sod Fluoride -Potassium Nitrate  (PREVIDENT 5000 ENAMEL PROTECT) 1.1-5 % GEL Brush with twice daily. Do not swallow, do not eat or drink for 30 minutes. 11/01/24     sucralfate  (CARAFATE ) 1 g tablet Take 1 tablet (1 g total) by mouth 3 (three) times daily before meals  Take on an empty stomach 10/26/23     Turmeric (QC TUMERIC COMPLEX PO) Take 1 tablet by mouth daily. Patient not taking: Reported on 01/04/2025    [provider]  Vitamin A  (A-25 PO) Take 1 tablet by mouth daily.    [provider]  zinc  gluconate 50 MG tablet Take 50 mg by mouth daily. Patient not taking: Reported on 01/04/2025    [provider]    Allergies: Morphine , Lamotrigine , Protein, Wellbutrin xl [bupropion], and Latex    Review of Systems  Constitutional:  Negative for fever.  Gastrointestinal:  Negative for abdominal pain and bowel incontinence.  Genitourinary:  Negative for bladder incontinence and dysuria.  Musculoskeletal:  Positive for back pain.  Neurological:  Positive for tingling and numbness.    Updated Vital Signs BP (!) 117/97 (BP Location: Right Arm)   Pulse 91   Temp 97.8 F (36.6 C) (Oral)   Resp 16   SpO2 100%   Physical Exam Vitals and nursing note reviewed.  Constitutional:      General: She is not in acute distress.    Appearance: Normal appearance. She is well-developed.  HENT:     Head: Normocephalic and atraumatic.  Eyes:     Conjunctiva/sclera: Conjunctivae normal.  Cardiovascular:     Rate and Rhythm: Normal rate and regular rhythm.     Heart sounds: No murmur heard. Pulmonary:     Effort: Pulmonary effort is normal. No respiratory distress.     Breath sounds: Normal breath sounds.  Abdominal:     Palpations: Abdomen is soft.     Tenderness: There is no abdominal tenderness.  Musculoskeletal:        General: Tenderness present.      Cervical back: Neck supple.     Comments: She has tenderness in lumbar left paralumbar into the left buttock area.  Skin:    General: Skin is warm and dry.     Capillary Refill: Capillary refill takes less than 2 seconds.  Neurological:     Mental Status: She is alert.     Sensory: Sensory deficit present.     Comments: She has subjective decrease sensation left anterior thigh.  She has reasonably preserved strength in her left leg although is limited by pain.     (all labs ordered are listed, but only abnormal results are displayed) Labs Reviewed - No data to display  EKG: None  Radiology: CT Lumbar Spine Wo Contrast Result Date: 01/09/2025 EXAM: CT OF THE LUMBAR SPINE WITHOUT CONTRAST 01/09/2025 03:31:18 PM TECHNIQUE: CT of the lumbar spine was performed without the administration of intravenous contrast. Multiplanar reformatted images are provided for review. Automated exposure control, iterative reconstruction, and/or weight based adjustment of the mA/kV was utilized to reduce the radiation dose to as low as reasonably achievable. COMPARISON: CT abdomen and pelvis 07/29/2024. CLINICAL HISTORY: Low back pain, spondyloarthropathy suspected, xray done. FINDINGS: BONES AND ALIGNMENT: Normal vertebral body heights. No acute fracture or traumatic malalignment of the lumbar spine. There is no significant vertebral column listhesis. DEGENERATIVE CHANGES: At L3-L4, there is effacement of the left L3-L4 neural foraminal fat. At L4-L5, there is disc bulge and moderate to severe bilateral facet arthropathy without significant spinal stenosis. Mild-to-moderate right and mild left neural foraminal narrowing. At L5-S1, there is disc bulge and right greater than left bilateral facet arthropathy without significant spinal stenosis. There is mild right and moderate left neural foraminal narrowing. SOFT TISSUES: Cholecystectomy changes. Sleeve gastrectomy changes. No acute abnormality. IMPRESSION: 1. No acute  fracture or traumatic malalignment of the lumbar spine. 2. Suspect at least mild-to-moderate left neural foraminal narrowing at L3-L4, though evaluation limited by CT technique. MRI would be useful to further assess. 3. Otherwise up to moderate neural foraminal narrowing most pronounced at the left L5-S1 level. 4. No high grade spinal canal stenosis by CT. Electronically signed by: Prentice Spade MD 01/09/2025 04:14 PM EST RP Workstation: [id number]    {Document cardiac monitor, telemetry assessment procedure when appropriate:32947} Procedures   Medications Ordered in the ED  dexamethasone  (DECADRON ) injection 10 mg (has no administration in time range)  ibuprofen  (ADVIL ) tablet 800 mg (800 mg Oral Given 01/09/25 1436)  diazepam  (VALIUM ) tablet 2 mg (2 mg Oral Given 01/09/25 1436)  oxyCODONE -acetaminophen  (PERCOCET/ROXICET) 5-325 MG per tablet 2 tablet (2 tablets Oral Given 01/09/25 1752)      {Click here for ABCD2, HEART and other calculators REFRESH Note before signing:1}                              Medical Decision Making Risk Prescription drug management.   This patient complains of ***; this involves an extensive number of treatment Options and is a complaint that carries with it a high risk of complications and morbidity. The differential includes ***  I ordered, reviewed and interpreted labs, which included *** I ordered medication *** and reviewed PMP when indicated. I ordered imaging studies which included *** and I independently    visualized and interpreted imaging which showed *** Additional history obtained from *** Previous records obtained and reviewed *** I consulted *** and discussed lab and imaging findings and discussed disposition.  Cardiac monitoring reviewed, *** Social determinants considered, *** Critical Interventions: ***  After the interventions stated above, I reevaluated the patient and found *** Admission and further testing considered,  ***   {Document critical care time when appropriate  Document review of labs and clinical decision tools ie CHADS2VASC2, etc  Document your independent review of radiology images and any outside records  Document your discussion with family members, caretakers and with consultants  Document social determinants of health affecting pt's care  Document your decision making why or why not admission, treatments were needed:32947:::1}   Final diagnoses:  None    ED Discharge Orders     None        "

## 2025-01-09 NOTE — ED Provider Triage Note (Signed)
 Emergency Medicine Provider Triage Evaluation Note  Krista Fisher  was evaluated in triage.  Pt complains of severe back pain. Hx of lumbosacral radiculopathy had a chiropractic adjustment this am. Now with severe pain normally takes tyelnol now havin numbness down the front of the leg.    Review of Systems  Positive: Leg and backpain  Negative: fever  Physical Exam  There were no vitals taken for this visit. Gen:   Awake, no distress   Resp:  Normal effort  MSK:   Moves extremities without difficulty  Other:  Able to stand and pivot  Medical Decision Making  Medically screening exam initiated at 2:19 PM.  Appropriate orders placed.  Krista Fisher was informed that the remainder of the evaluation will be completed by another provider, this initial triage assessment does not replace that evaluation, and the importance of remaining in the ED until their evaluation is complete.     Krista Chroman, PA-C 01/09/25 1421

## 2025-01-09 NOTE — ED Triage Notes (Signed)
 Pt BIB [facility] from home for chronic back pain, worsening since chiropractic tx this morning. Reports pain on L lower back radiating to L groin and down L leg.   126/74 110 99%

## 2025-01-10 LAB — ZINC: Zinc: 64 ug/dL (ref 60–130)

## 2025-01-13 ENCOUNTER — Ambulatory Visit: Payer: Self-pay | Admitting: Family Medicine

## 2025-01-17 DIAGNOSIS — Z0279 Encounter for issue of other medical certificate: Secondary | ICD-10-CM

## 2025-02-06 ENCOUNTER — Ambulatory Visit (HOSPITAL_BASED_OUTPATIENT_CLINIC_OR_DEPARTMENT_OTHER): Admitting: Physical Therapy

## 2025-04-06 ENCOUNTER — Ambulatory Visit (HOSPITAL_COMMUNITY): Admit: 2025-04-06 | Admitting: Obstetrics and Gynecology

## 2025-04-06 SURGERY — HYSTERECTOMY, TOTAL, LAPAROSCOPIC, WITH SALPINGECTOMY
Anesthesia: General
